# Patient Record
Sex: Female | Born: 1935 | Race: Black or African American | Hispanic: No | Marital: Single | Smoking: Former smoker
Health system: Southern US, Community
[De-identification: ages and names within clinical notes are randomized; demographics above are authoritative.]

## PROBLEM LIST (undated history)

## (undated) DIAGNOSIS — C50311 Malignant neoplasm of lower-inner quadrant of right female breast: Principal | ICD-10-CM

## (undated) DIAGNOSIS — I10 Essential (primary) hypertension: Secondary | ICD-10-CM

## (undated) DIAGNOSIS — Z8711 Personal history of peptic ulcer disease: Secondary | ICD-10-CM

## (undated) DIAGNOSIS — M1990 Unspecified osteoarthritis, unspecified site: Secondary | ICD-10-CM

## (undated) DIAGNOSIS — C50919 Malignant neoplasm of unspecified site of unspecified female breast: Secondary | ICD-10-CM

## (undated) DIAGNOSIS — J45909 Unspecified asthma, uncomplicated: Secondary | ICD-10-CM

## (undated) DIAGNOSIS — F0390 Unspecified dementia without behavioral disturbance: Secondary | ICD-10-CM

## (undated) DIAGNOSIS — Z8719 Personal history of other diseases of the digestive system: Secondary | ICD-10-CM

## (undated) DIAGNOSIS — I499 Cardiac arrhythmia, unspecified: Secondary | ICD-10-CM

## (undated) DIAGNOSIS — F0150 Vascular dementia without behavioral disturbance: Secondary | ICD-10-CM

## (undated) DIAGNOSIS — I2510 Atherosclerotic heart disease of native coronary artery without angina pectoris: Secondary | ICD-10-CM

## (undated) DIAGNOSIS — G5600 Carpal tunnel syndrome, unspecified upper limb: Secondary | ICD-10-CM

## (undated) DIAGNOSIS — E785 Hyperlipidemia, unspecified: Secondary | ICD-10-CM

## (undated) HISTORY — PX: ABDOMINAL HYSTERECTOMY: SHX81

## (undated) HISTORY — PX: SHOULDER ARTHROSCOPY: SHX128

## (undated) HISTORY — PX: EYE SURGERY: SHX253

## (undated) HISTORY — DX: Malignant neoplasm of lower-inner quadrant of right female breast: C50.311

## (undated) HISTORY — PX: MASTECTOMY: SHX3

---

## 1997-12-05 ENCOUNTER — Emergency Department (HOSPITAL_COMMUNITY): Admission: EM | Admit: 1997-12-05 | Discharge: 1997-12-05 | Payer: Self-pay | Admitting: Emergency Medicine

## 1998-01-06 ENCOUNTER — Other Ambulatory Visit: Admission: RE | Admit: 1998-01-06 | Discharge: 1998-01-06 | Payer: Self-pay | Admitting: Family Medicine

## 1998-09-30 ENCOUNTER — Ambulatory Visit (HOSPITAL_COMMUNITY): Admission: RE | Admit: 1998-09-30 | Discharge: 1998-09-30 | Payer: Self-pay | Admitting: Internal Medicine

## 1998-09-30 ENCOUNTER — Encounter: Payer: Self-pay | Admitting: Internal Medicine

## 1998-10-21 ENCOUNTER — Ambulatory Visit (HOSPITAL_COMMUNITY): Admission: RE | Admit: 1998-10-21 | Discharge: 1998-10-21 | Payer: Self-pay | Admitting: Internal Medicine

## 1998-10-21 ENCOUNTER — Encounter: Payer: Self-pay | Admitting: Internal Medicine

## 1998-11-04 ENCOUNTER — Ambulatory Visit (HOSPITAL_COMMUNITY): Admission: RE | Admit: 1998-11-04 | Discharge: 1998-11-04 | Payer: Self-pay

## 1998-12-10 ENCOUNTER — Emergency Department (HOSPITAL_COMMUNITY): Admission: EM | Admit: 1998-12-10 | Discharge: 1998-12-10 | Payer: Self-pay | Admitting: Emergency Medicine

## 2000-10-17 ENCOUNTER — Encounter: Payer: Self-pay | Admitting: Family Medicine

## 2000-10-17 ENCOUNTER — Ambulatory Visit (HOSPITAL_COMMUNITY): Admission: RE | Admit: 2000-10-17 | Discharge: 2000-10-17 | Payer: Self-pay | Admitting: Family Medicine

## 2000-10-18 ENCOUNTER — Encounter: Payer: Self-pay | Admitting: Emergency Medicine

## 2000-10-18 ENCOUNTER — Emergency Department (HOSPITAL_COMMUNITY): Admission: EM | Admit: 2000-10-18 | Discharge: 2000-10-18 | Payer: Self-pay | Admitting: Emergency Medicine

## 2000-10-26 ENCOUNTER — Ambulatory Visit (HOSPITAL_COMMUNITY): Admission: RE | Admit: 2000-10-26 | Discharge: 2000-10-26 | Payer: Self-pay | Admitting: Family Medicine

## 2000-11-22 ENCOUNTER — Emergency Department (HOSPITAL_COMMUNITY): Admission: EM | Admit: 2000-11-22 | Discharge: 2000-11-22 | Payer: Self-pay | Admitting: *Deleted

## 2000-11-28 ENCOUNTER — Ambulatory Visit (HOSPITAL_COMMUNITY): Admission: RE | Admit: 2000-11-28 | Discharge: 2000-11-28 | Payer: Self-pay | Admitting: Family Medicine

## 2000-11-28 ENCOUNTER — Encounter: Payer: Self-pay | Admitting: Family Medicine

## 2001-04-29 ENCOUNTER — Encounter: Payer: Self-pay | Admitting: Family Medicine

## 2001-04-29 ENCOUNTER — Ambulatory Visit (HOSPITAL_COMMUNITY): Admission: RE | Admit: 2001-04-29 | Discharge: 2001-04-29 | Payer: Self-pay | Admitting: Family Medicine

## 2001-07-19 HISTORY — PX: CARDIAC CATHETERIZATION: SHX172

## 2002-05-09 ENCOUNTER — Encounter: Payer: Self-pay | Admitting: Family Medicine

## 2002-05-09 ENCOUNTER — Inpatient Hospital Stay (HOSPITAL_COMMUNITY): Admission: EM | Admit: 2002-05-09 | Discharge: 2002-05-10 | Payer: Self-pay | Admitting: Emergency Medicine

## 2002-11-13 ENCOUNTER — Encounter: Payer: Self-pay | Admitting: Internal Medicine

## 2002-11-13 ENCOUNTER — Ambulatory Visit (HOSPITAL_COMMUNITY): Admission: RE | Admit: 2002-11-13 | Discharge: 2002-11-13 | Payer: Self-pay | Admitting: Internal Medicine

## 2004-02-25 ENCOUNTER — Ambulatory Visit (HOSPITAL_COMMUNITY): Admission: RE | Admit: 2004-02-25 | Discharge: 2004-02-25 | Payer: Self-pay | Admitting: Family Medicine

## 2004-05-18 ENCOUNTER — Ambulatory Visit: Payer: Self-pay | Admitting: Nurse Practitioner

## 2004-05-19 ENCOUNTER — Ambulatory Visit (HOSPITAL_COMMUNITY): Admission: RE | Admit: 2004-05-19 | Discharge: 2004-05-19 | Payer: Self-pay | Admitting: Internal Medicine

## 2004-08-14 ENCOUNTER — Ambulatory Visit: Payer: Self-pay | Admitting: Nurse Practitioner

## 2004-11-27 ENCOUNTER — Ambulatory Visit: Payer: Self-pay | Admitting: Nurse Practitioner

## 2005-02-10 ENCOUNTER — Ambulatory Visit: Payer: Self-pay | Admitting: Nurse Practitioner

## 2005-05-07 ENCOUNTER — Emergency Department (HOSPITAL_COMMUNITY): Admission: EM | Admit: 2005-05-07 | Discharge: 2005-05-07 | Payer: Self-pay | Admitting: Emergency Medicine

## 2005-06-17 ENCOUNTER — Ambulatory Visit: Payer: Self-pay | Admitting: Nurse Practitioner

## 2005-06-24 ENCOUNTER — Ambulatory Visit (HOSPITAL_COMMUNITY): Admission: RE | Admit: 2005-06-24 | Discharge: 2005-06-24 | Payer: Self-pay | Admitting: Internal Medicine

## 2005-11-17 ENCOUNTER — Encounter: Payer: Self-pay | Admitting: Emergency Medicine

## 2008-04-25 ENCOUNTER — Ambulatory Visit (HOSPITAL_COMMUNITY): Admission: RE | Admit: 2008-04-25 | Discharge: 2008-04-25 | Payer: Self-pay | Admitting: Internal Medicine

## 2010-12-04 NOTE — H&P (Signed)
NAME:  Jennifer Garrett, Jennifer Garrett                             ACCOUNT NO.:  [id number]   MEDICAL RECORD NO.:  [id number]                   PATIENT TYPE:  INP   LOCATION:  6532                                 FACILITY:  [place]   PHYSICIAN:  Eden Lathe. Einar Gip, M.D.                  DATE OF BIRTH:  07-Dec-1931   DATE OF ADMISSION:  05/09/2002  DATE OF DISCHARGE:                                HISTORY & PHYSICAL   CHIEF COMPLAINT:  Chest pain and shortness of breath.   HISTORY OF PRESENT ILLNESS:  The patient is a 77 year old female with no  prior history of coronary disease or MI.  She is followed at [facility] by  Dr. Abelina Bachelor.  She presented to the emergency room at [facility] with  complaints of three days of substernal chest pain and dyspnea on exertion.  She describes her pain as a midsternal ache.  She came to the emergency  room on October 22, because of her persistent symptoms.  She is not taking  any medications for this discomfort.  She does have some radiation to her  shoulders.  Symptoms seem to be exertional and she has had no rest pain.   PAST MEDICAL HISTORY:  1. Hypertension.  2. Non-insulin dependent diabetes mellitus.  3. She is unsure of her cholesterol status.  She had been on hyperlipidemia     therapy in the past, but this was stopped and she says her cholesterol     recently was okay.   PAST SURGICAL HISTORY:  1. Hysterectomy.  2. Shoulder surgery on both shoulders.   MEDICATIONS:  1. Glucotrol XL 10 mg a day.  2. Avandia  3. Lisinopril.  4. Hydrochlorothiazide.  5. Triamterene.   ALLERGIES:  ORUDIS and FELDENE.   SOCIAL HISTORY:  She is an ex-smoker quitting more than 20 year ago.  She is  single and lives with her daughter.  She has two children and no  grandchildren.  She is unemployed.   FAMILY HISTORY:  Mother died in her 0000000 of complications of congestion  failure.  Father died at 58.  She has one brother and four sisters, but none  with coronary artery  disease.   REVIEW OF SYMPTOMS:  Essentially unremarkable except as noted above.  She  has had a remote GI bleed.  She has had remote thyroid problems, but is not  on medication.  She did have a fall getting out of the bathtub in May with  left rib fractures.   PHYSICAL EXAMINATION:  VITAL SIGNS:  Blood pressure 122/60, pulse 88,  respirations 16.  GENERAL:  She is a well-developed, well-nourished, overweight, African-  American female in no acute distress.  HEENT:  Normocephalic, extraocular movements intact.  Sclerae nonicteric.  Conjunctivae within normal limits.  NECK:  Without bruits or JVD.  CHEST:  Clear to auscultation and percussion.  CARDIAC:  Regular rate and rhythm without obvious murmur, rub or gallop.  Normal S1, S2.  ABDOMEN:  Obese, nontender.  Bowel sounds present.  EXTREMITIES:  Rule out edema.  Distal pulses are 2+/4 bilaterally.  NEUROLOGIC:  Grossly intact.  She is awake, alert and oriented.  Moves all  extremities without obvious deficit.   LABORATORY DATA AND X-RAY FINDINGS:  EKG with sinus rhythm with septal Q's,  PVCs and LVH.  She has a left axis deviation.   Labs with sodium 129, potassium 5.2, BUN 18, creatinine 1.0.  White count  6.1, hemoglobin 13.4, hematocrit 41.3, platelets 319.  CK 168, MB 1.1,  troponin negative.  Chest x-ray shows no active disease.   IMPRESSION:  1. Unstable angina.  2. Abnormal electrocardiogram.  3. Hypertension with hypertensive cardiovascular disease.  4. Non-insulin dependent diabetes mellitus.  5. Obesity.   PLAN:  1. The patient was started on aspirin, nitrates and heparin.  2. She will be taken to the catheterization lab.     Erlene Quan, P.A.                      Eden Lathe. Einar Gip, M.D.    Meryl Dare  D:  05/10/2002  T:  05/10/2002  Job:  [id number]

## 2010-12-04 NOTE — Discharge Summary (Signed)
NAME:  Jennifer Garrett, Jennifer Garrett                             ACCOUNT NO.:  [id number]   MEDICAL RECORD NO.:  [id number]                   PATIENT TYPE:  INP   LOCATION:  [id number]                                 FACILITY:  [facility]   PHYSICIAN:  Josef Tourigny DICTATOR                    DATE OF BIRTH:  23-Nov-1931   DATE OF ADMISSION:  05/09/2002  DATE OF DISCHARGE:                                 DISCHARGE SUMMARY   DISCHARGE DIAGNOSES:  1. Unstable angina.  2. Coronary disease, OM3 stenting by Dr. Einar Gip this admission.  3. Non-insulin dependent diabetes mellitus.  4. Hypertension with suboptimal control.  5. Hyperlipidemia.   HOSPITAL COURSE:  The patient is a 77 year old female followed by Dr.  Abelina Bachelor at [facility]. She has no prior history of coronary artery  disease or myocardial infarction. She was admitted through the emergency  room on May 09, 2002 with three days of substernal chest pain,  consistent with unstable angina. She has risk factors as noted above. She  was taken to the catheterization lab by Dr. Einar Gip. She was started on  Heparin, nitrates, and aspirin in the emergency room. Catheterization on  May 09, 2002 revealed an 80% small RCA narrowing, 20-30% hazy proximal  LAD lesion, and a 90% OM3 lesion that was effaced and felt to be heavy with  plaque. The patient underwent angioplasty and cipher Stent placement to the  OM3. Renal arteries were patent. Aorta was tortuous and there was no  significant iliac disease. The patient tolerated the procedure well. CK and  troponin's remained negative. She will be discharged later on the 23rd.   DISCHARGE MEDICATIONS:  1. Zocor 40 mg each day.  2. Aspirin 81 mg each day.  3. Plavix 75 mg each day. Dr. Einar Gip recommends Plavix for 9-10 months.  4. The patient was on Lisinopril and HCTZ at home, although she is unsure of     the dose. She will resume her home dose.  5. Glucotrol XL 10 mg each day.  6. Avandia (she is also unsure of  her dose of this).  7. Nitroglycerin prescription given to patient.   LABORATORY DATA:  White count 5.9, hemoglobin and hematocrit 11.7 and 34.8,  platelets 287, sodium 134, potassium 4.0, BUN 12, creatinine 0.8. CK, MB and  troponin's are negative times three. Lipid profile shows an LDL of 104, HDL  43, triglycerides 92. INR was 0.9. EKG reveals sinus rhythm, left axis  deviation, some evidence of left ventricular hypertrophy.   DIAGNOSTIC STUDIES:  Chest x-ray shows healing left rib fractures.   DISPOSITION:  The patient was discharged in stable to home.    FOLLOW UP:  With Dr. Einar Gip in a few weeks. She will need a fasting lipid  profile in three months. Dr. Einar Gip has recommended Plavix for 9-12 months,  although this may be problematic because of cost  for her.                                               Kajol Crispen DICTATOR    DD/MEDQ  D:  05/10/2002  T:  05/10/2002  Job:  [id number]   cc:   Florian Buff, M.D.   Abelina Bachelor, M.D.  [facility]

## 2010-12-04 NOTE — Cardiovascular Report (Signed)
NAME:  Jennifer Garrett, Jennifer Garrett                             ACCOUNT NO.:  [id number]   MEDICAL RECORD NO.:  [id number]                   PATIENT TYPE:  INP   LOCATION:  6532                                 FACILITY:  [place]   PHYSICIAN:  Eden Lathe. Einar Gip, M.D.                  DATE OF BIRTH:  11-20-1931   DATE OF PROCEDURE:  05/09/2002  DATE OF DISCHARGE:                              CARDIAC CATHETERIZATION   PROCEDURE PERFORMED:  1. Left heart catheterization including:  2. Left ventriculography.  3. Selective left and right coronary arteriography.  4. Abdominal aortogram.  5. Intravascular ultrasound interrogation of the left anterior descending     artery.  6. Intravascular ultrasound interrogation of the obtuse marginal branch of     the circumflex coronary artery.  7. Percutaneous transluminal coronary angioplasty and stenting of the obtuse     marginal branch of the circumflex coronary artery.  8. Intracoronary nitroglycerin administration.  9. Adjuvant Integrilin utilization.   INDICATIONS FOR PROCEDURE:  The patient is a 77 year old female with a  previous history of hypertension, diabetes mellitus, who was admitted to the  hospital with chest pain suggestive of unstable angina.  Due to multiple  cardiac risk factors, she was brought directly to the cardiac  catheterization lab to evaluate her coronary anatomy.  She was still having  ongoing chest pain in the emergency room although this was mild and was  subtle.   HEMODYNAMIC DATA:  1. The left ventricular pressures were 111/2 with an end diastolic pressure     of 9 mmHg.  2. The aortic pressure was 102/60 with a mean of 77 mmHg.  3. There was no pressure gradient across the aortic valve.   ANGIOGRAPHIC DATA:  1. Left ventricle:  The left ventricular systolic function was normal.     Ejection fraction was estimated at 60%.  There was no significant mitral     regurgitation.  2. Right coronary artery:  The right coronary artery is  a nondominant     vessel.  The mid segment has 80% stenosis.  3. Left main coronary artery:  The left main coronary is a large caliber     vessel.  It has mild calcification.  Otherwise it is normal.  4. Circumflex coronary artery:  The circumflex coronary artery is a very     large caliber vessel and a dominant vessel.  It gives origin to a very     large PDA.  It also gives origin to three obtuse marginal branches,     obtuse marginal #1, #2, and #3.  The obtuse marginal #3 has mid 80-90%     stenosis.  The circumflex itself has mild diffuse disease.  5. Left anterior descending artery:  The left anterior descending artery is     a very large caliber vessel and gives origin to a large diagonal #1.  It  wraps around the apex.  The proximal segment of the left anterior     descending artery and also the mid segment after the origin of the     diagonal #1 has hazy 20-30% stenosis and is suspicious for an unstable     plaque versus probably a lesion filled with plaque.  Otherwise the LAD     has mild diffuse disease.  6. Abdominal aortogram:  Abdominal aortogram was performed to evaluate for     peripheral vascular disease, given abnormal physical findings of right     femoral bruit.  There was also tortuosity noted of the catheter.     Abdominal aortogram revealed mild atherosclerotic changes of the     abdominal aorta with tortuosity.  There are two renal arteries, one on     either side and were widely patent.   IMPRESSION:  1. An 80% angiographic stenosis of the obtuse marginal #3 branch of the     circumflex coronary artery.  The circumflex is a dominant coronary     artery.  2. Hazy tandem lesions in the proximal and mid left anterior descending     artery constituting 20-30% stenosis.  3. Normal left ventricular systolic function.  Ejection fraction 60%.   IVUS DATA:  The LAD lesions were 20-30% with mild calcification.  There was  no significant luminal irregularity and the  lumen measured about 3.8 to 4.0  mm in the proximal and about 3.5 mm in the mid segment.   IVUS data on the obtuse marginal branch of the circumflex coronary artery:  The IVUS of OM-3 revealed a tightly packed lesion in the mid obtuse marginal  branch.  This was mostly a soft plaque (90% stenosis).   INTERVENTIONAL DATA:  PTCA and stenting of the obtuse marginal #3 branch of  the circumflex coronary artery with a 2.5 x 13-mm CYPHER stent.  The stent  was deployed at 16 atmospheres of pressure.   The stent was post dilated with a 2.75 x 9-mm Quantum balloon at 16  atmospheres of pressure giving a 2.8 mm lumen.   Post angioplasty IVUS interrogation of the obtuse marginal branch:  Post PCI  IVUS interrogation of the obtuse marginal revealed adequate stent deployment  and apposition to the wall.  There was no evidence of aortic stenosis.   OVERALL IMPRESSION:  Successful percutaneous transluminal coronary  angioplasty and stenting of the obtuse marginal #3 branch of a dominant  circumflex coronary artery with a 2.5 x 13-mm CYPHER stent and post  dilatation with a 2.75 x 9-mm Quantum balloon.  The stenosis was reduced  from 90% to 0% with TIMI-3 to TIMI-3 flow maintained.  No evidence of  dissection, no evidence of thrombus at the end of the procedure.   TECHNIQUE:  Under usual sterile precautions using #6 French right femoral  access, a #6 [place] Multipurpose B2 catheter was advanced to the ascending  aorta over a 0.035-inch J wire.  The catheter was gently advanced to the  left ventricle.  Left ventricular pressures were monitored.  Hand contrast  injection of the left ventricle was performed both in the LAO and RAO  projections.  The catheter was flushed with saline and pulled back into the  ascending aorta and pressure gradient across the aortic valve was monitored. The right coronary artery was selectively engaged and angiography was  performed.  Then the catheter was pulled back and  the left main coronary  artery was selectively engaged and angiography  was performed.  Intracoronary  nitroglycerin was also administered.  Then the catheter was pulled back into  the abdominal aorta and abdominal aortogram was performed.   TECHNIQUE OF INTERVENTION:  The #6 French sheath was exchanged for a #7  [place] sheath.  Then a #7 [place] FR-4 guide catheter was advanced to the  ascending aorta over a 0.035-inch J wire.  The catheter was manipulated and  engaged in the left main coronary artery and angiography was performed.  Then a 180-cm x 0.014-inch ATW guidewire was advanced into the left anterior  descending artery and a Scimed IVUS catheter was advanced over this  guidewire into the left anterior descending artery.  IVUS interrogation of  the left anterior descending artery was performed.  The IVUS data was  carefully analyzed.  Then the catheter was pulled back into the guiding  catheter and the guidewire was pulled back into the guiding catheter.  Angiography was performed.  Then the same guidewire was advanced into the  circumflex coronary artery and the IVUS catheter was then readvanced into  the obtuse marginal #3 branch of the circumflex coronary artery and IVUS  interrogation of the obtuse marginal #3 was performed.  Then the IVUS  catheter was pulled back outside of the body.  Then a 2.5 x 13-mm CYPHER  stent was advanced into the obtuse marginal #3 branch of the circumflex  coronary artery and the stent was deployed at a total of 16 atmospheres of  pressure for 90 seconds.  The balloon was deflated, pulled back into the  guiding catheter, and arteriography was performed.  Because of the dumbbell  shape of the stent, a 2.75 x 9-mm Quantum noncompliant balloon was advanced  into the stent lumen and forced dilatation was performed at 12, and a total  of 16, atmospheres of pressure within the stent for a total of 30 seconds  each, and the balloon was deflated and pulled  back into the guiding catheter  and arteriography was performed.  Intracoronary nitroglycerin was also  administered.  Excellent results were noted.  Then the balloon was returned  out of the body.  Then the Sci-Med IVUS catheter was readvanced over the  guidewire into the obtuse marginal #3 and IVUS interrogation was again  performed.  The data was carefully analyzed.  Then the guidewire and the  IVUS catheter were pulled out of the body.  Arteriography was performed.  Excellent results were noted overall.  Then the arterial access sheath was  sutured in place and the guide catheter was then removed out of the body in  the usual fashion and the patient was transferred to recovery in stable  condition.  The patient tolerated the procedure well.                                                Eden Lathe. Einar Gip, M.D.   Minna Antis  D:  05/09/2002  T:  05/09/2002  Job:  [id number]

## 2012-03-03 ENCOUNTER — Other Ambulatory Visit: Payer: Self-pay | Admitting: Orthopedic Surgery

## 2012-03-24 ENCOUNTER — Encounter (HOSPITAL_BASED_OUTPATIENT_CLINIC_OR_DEPARTMENT_OTHER): Payer: Self-pay | Admitting: *Deleted

## 2012-03-24 NOTE — Progress Notes (Signed)
To come in for lqabs-called dr berry for ekg-notes-had stent 2003

## 2012-03-27 ENCOUNTER — Encounter (HOSPITAL_BASED_OUTPATIENT_CLINIC_OR_DEPARTMENT_OTHER)
Admission: RE | Admit: 2012-03-27 | Discharge: 2012-03-27 | Disposition: A | Discharge status: Home / Self Care | Payer: Medicare Other | Source: Ambulatory Visit | Attending: Orthopedic Surgery | Admitting: Orthopedic Surgery

## 2012-03-27 LAB — BASIC METABOLIC PANEL
Calcium: 9.6 mg/dL (ref 8.4–10.5)
GFR calc Af Amer: 76 mL/min — ABNORMAL LOW (ref >90)
GFR calc non Af Amer: 65 mL/min — ABNORMAL LOW (ref >90)
Sodium: 139 mEq/L (ref 135–145)

## 2012-03-29 ENCOUNTER — Encounter (HOSPITAL_BASED_OUTPATIENT_CLINIC_OR_DEPARTMENT_OTHER): Payer: Self-pay | Admitting: *Deleted

## 2012-03-29 ENCOUNTER — Encounter (HOSPITAL_BASED_OUTPATIENT_CLINIC_OR_DEPARTMENT_OTHER): Payer: Self-pay | Admitting: Certified Registered Nurse Anesthetist

## 2012-03-29 ENCOUNTER — Encounter (HOSPITAL_BASED_OUTPATIENT_CLINIC_OR_DEPARTMENT_OTHER): Payer: Self-pay | Admitting: Orthopedic Surgery

## 2012-03-29 ENCOUNTER — Encounter (HOSPITAL_BASED_OUTPATIENT_CLINIC_OR_DEPARTMENT_OTHER)
Admission: RE | Disposition: A | Discharge status: Home / Self Care | Payer: Self-pay | Source: Ambulatory Visit | Attending: Orthopedic Surgery

## 2012-03-29 ENCOUNTER — Ambulatory Visit (HOSPITAL_BASED_OUTPATIENT_CLINIC_OR_DEPARTMENT_OTHER): Payer: Medicare Other | Admitting: *Deleted

## 2012-03-29 ENCOUNTER — Ambulatory Visit (HOSPITAL_BASED_OUTPATIENT_CLINIC_OR_DEPARTMENT_OTHER)
Admission: RE | Admit: 2012-03-29 | Discharge: 2012-03-29 | Disposition: A | Discharge status: Home / Self Care | Payer: Medicare Other | Source: Ambulatory Visit | Attending: Orthopedic Surgery | Admitting: Orthopedic Surgery

## 2012-03-29 DIAGNOSIS — G5600 Carpal tunnel syndrome, unspecified upper limb: Secondary | ICD-10-CM | POA: Insufficient documentation

## 2012-03-29 DIAGNOSIS — M6530 Trigger finger, unspecified finger: Secondary | ICD-10-CM | POA: Insufficient documentation

## 2012-03-29 DIAGNOSIS — J45909 Unspecified asthma, uncomplicated: Secondary | ICD-10-CM | POA: Insufficient documentation

## 2012-03-29 DIAGNOSIS — E119 Type 2 diabetes mellitus without complications: Secondary | ICD-10-CM | POA: Insufficient documentation

## 2012-03-29 DIAGNOSIS — Z01812 Encounter for preprocedural laboratory examination: Secondary | ICD-10-CM | POA: Insufficient documentation

## 2012-03-29 DIAGNOSIS — Z01810 Encounter for preprocedural cardiovascular examination: Secondary | ICD-10-CM | POA: Insufficient documentation

## 2012-03-29 DIAGNOSIS — I10 Essential (primary) hypertension: Secondary | ICD-10-CM | POA: Insufficient documentation

## 2012-03-29 DIAGNOSIS — I2510 Atherosclerotic heart disease of native coronary artery without angina pectoris: Secondary | ICD-10-CM | POA: Insufficient documentation

## 2012-03-29 HISTORY — DX: Carpal tunnel syndrome, unspecified upper limb: G56.00

## 2012-03-29 HISTORY — DX: Atherosclerotic heart disease of native coronary artery without angina pectoris: I25.10

## 2012-03-29 HISTORY — DX: Unspecified asthma, uncomplicated: J45.909

## 2012-03-29 HISTORY — DX: Essential (primary) hypertension: I10

## 2012-03-29 HISTORY — PX: CARPAL TUNNEL RELEASE: SHX101

## 2012-03-29 HISTORY — DX: Hyperlipidemia, unspecified: E78.5

## 2012-03-29 HISTORY — PX: TRIGGER FINGER RELEASE: SHX641

## 2012-03-29 HISTORY — DX: Unspecified osteoarthritis, unspecified site: M19.90

## 2012-03-29 LAB — GLUCOSE, CAPILLARY: Glucose-Capillary: 126 mg/dL — ABNORMAL HIGH (ref 70–99)

## 2012-03-29 LAB — POCT HEMOGLOBIN-HEMACUE: Hemoglobin: 11.7 g/dL — ABNORMAL LOW (ref 12.0–15.0)

## 2012-03-29 SURGERY — CARPAL TUNNEL RELEASE
Anesthesia: Monitor Anesthesia Care | Site: Wrist | Laterality: Left | Wound class: Clean

## 2012-03-29 MED ORDER — PROPOFOL 10 MG/ML IV EMUL
INTRAVENOUS | Status: DC | PRN
  Administered 2012-03-29: 75 ug/kg/min via INTRAVENOUS

## 2012-03-29 MED ORDER — BUPIVACAINE HCL (PF) 0.25 % IJ SOLN
INTRAMUSCULAR | Status: DC | PRN
  Administered 2012-03-29: 10 mL

## 2012-03-29 MED ORDER — FENTANYL CITRATE 0.05 MG/ML IJ SOLN: 25.0000 ug | INTRAMUSCULAR | Status: DC | PRN

## 2012-03-29 MED ORDER — LACTATED RINGERS IV SOLN
INTRAVENOUS | Status: DC
  Administered 2012-03-29: 09:00:00 via INTRAVENOUS

## 2012-03-29 MED ORDER — CHLORHEXIDINE GLUCONATE 4 % EX LIQD: 60.0000 mL | Freq: Once | CUTANEOUS | Status: DC

## 2012-03-29 MED ORDER — CEFAZOLIN SODIUM-DEXTROSE 2-3 GM-% IV SOLR
2.0000 g | INTRAVENOUS | Status: AC
  Administered 2012-03-29: 2 g via INTRAVENOUS

## 2012-03-29 MED ORDER — MIDAZOLAM HCL 2 MG/2ML IJ SOLN: 0.5000 mg | Freq: Once | INTRAMUSCULAR | Status: DC | PRN

## 2012-03-29 MED ORDER — ONDANSETRON HCL 4 MG/2ML IJ SOLN
INTRAMUSCULAR | Status: DC | PRN
  Administered 2012-03-29: 4 mg via INTRAVENOUS

## 2012-03-29 MED ORDER — MEPERIDINE HCL 25 MG/ML IJ SOLN: 6.2500 mg | INTRAMUSCULAR | Status: DC | PRN

## 2012-03-29 MED ORDER — PROMETHAZINE HCL 25 MG/ML IJ SOLN: 6.2500 mg | INTRAMUSCULAR | Status: DC | PRN

## 2012-03-29 MED ORDER — HYDROCODONE-ACETAMINOPHEN 5-500 MG PO TABS: 1.0000 | ORAL_TABLET | ORAL | Status: AC | PRN

## 2012-03-29 MED ORDER — FENTANYL CITRATE 0.05 MG/ML IJ SOLN
INTRAMUSCULAR | Status: DC | PRN
  Administered 2012-03-29: 25 ug via INTRAVENOUS

## 2012-03-29 SURGICAL SUPPLY — 38 items
BANDAGE COBAN STERILE 2 (GAUZE/BANDAGES/DRESSINGS) ×3 IMPLANT
BANDAGE GAUZE ELAST BULKY 4 IN (GAUZE/BANDAGES/DRESSINGS) ×3 IMPLANT
BLADE SURG 15 STRL LF DISP TIS (BLADE) ×2 IMPLANT
BLADE SURG 15 STRL SS (BLADE) ×3
BNDG CMPR 9X4 STRL LF SNTH (GAUZE/BANDAGES/DRESSINGS)
BNDG COHESIVE 3X5 TAN STRL LF (GAUZE/BANDAGES/DRESSINGS) ×3 IMPLANT
BNDG ESMARK 4X9 LF (GAUZE/BANDAGES/DRESSINGS) IMPLANT
CHLORAPREP W/TINT 26ML (MISCELLANEOUS) ×3 IMPLANT
CLOTH BEACON ORANGE TIMEOUT ST (SAFETY) ×3 IMPLANT
CORDS BIPOLAR (ELECTRODE) ×3 IMPLANT
COVER MAYO STAND STRL (DRAPES) ×3 IMPLANT
COVER TABLE BACK 60X90 (DRAPES) ×3 IMPLANT
CUFF TOURNIQUET SINGLE 18IN (TOURNIQUET CUFF) ×3 IMPLANT
DECANTER SPIKE VIAL GLASS SM (MISCELLANEOUS) IMPLANT
DRAPE EXTREMITY T 121X128X90 (DRAPE) ×3 IMPLANT
DRAPE SURG 17X23 STRL (DRAPES) ×3 IMPLANT
DRSG KUZMA FLUFF (GAUZE/BANDAGES/DRESSINGS) ×3 IMPLANT
GAUZE XEROFORM 1X8 LF (GAUZE/BANDAGES/DRESSINGS) ×3 IMPLANT
GLOVE BIO SURGEON STRL SZ 6.5 (GLOVE) ×3 IMPLANT
GLOVE SURG ORTHO 8.0 STRL STRW (GLOVE) ×3 IMPLANT
GOWN BRE IMP PREV XXLGXLNG (GOWN DISPOSABLE) ×3 IMPLANT
GOWN PREVENTION PLUS XLARGE (GOWN DISPOSABLE) ×3 IMPLANT
NEEDLE 27GAX1X1/2 (NEEDLE) ×1 IMPLANT
NS IRRIG 1000ML POUR BTL (IV SOLUTION) ×3 IMPLANT
PACK BASIN DAY SURGERY FS (CUSTOM PROCEDURE TRAY) ×3 IMPLANT
PAD CAST 3X4 CTTN HI CHSV (CAST SUPPLIES) ×2 IMPLANT
PADDING CAST ABS 4INX4YD NS (CAST SUPPLIES) ×1
PADDING CAST ABS COTTON 4X4 ST (CAST SUPPLIES) ×2 IMPLANT
PADDING CAST COTTON 3X4 STRL (CAST SUPPLIES) ×3
SPONGE GAUZE 4X4 12PLY (GAUZE/BANDAGES/DRESSINGS) ×3 IMPLANT
STOCKINETTE 4X48 STRL (DRAPES) ×3 IMPLANT
SUT VICRYL 4-0 PS2 18IN ABS (SUTURE) IMPLANT
SUT VICRYL RAPIDE 4/0 PS 2 (SUTURE) ×3 IMPLANT
SYR BULB 3OZ (MISCELLANEOUS) ×3 IMPLANT
SYR CONTROL 10ML LL (SYRINGE) ×1 IMPLANT
TOWEL OR 17X24 6PK STRL BLUE (TOWEL DISPOSABLE) ×6 IMPLANT
UNDERPAD 30X30 INCONTINENT (UNDERPADS AND DIAPERS) ×3 IMPLANT
WATER STERILE IRR 1000ML POUR (IV SOLUTION) ×3 IMPLANT

## 2012-03-29 NOTE — Transfer of Care (Signed)
Immediate Anesthesia Transfer of Care Note  Patient: Jennifer Garrett  Procedure(s) Performed: Procedure(s) (LRB) with comments: CARPAL TUNNEL RELEASE (Left) RELEASE TRIGGER FINGER/A-1 PULLEY (Left)  Patient Location: PACU  Anesthesia Type: Bier block  Level of Consciousness: awake, alert , oriented and patient cooperative  Airway & Oxygen Therapy: Patient Spontanous Breathing and Patient connected to face mask oxygen  Post-op Assessment: Report given to PACU RN and Post -op Vital signs reviewed and stable  Post vital signs: Reviewed and stable  Complications: No apparent anesthesia complications

## 2012-03-29 NOTE — Anesthesia Preprocedure Evaluation (Addendum)
Anesthesia Evaluation  Patient identified by MRN, date of birth, ID band Patient awake    Reviewed: Allergy & Precautions, H&P , NPO status , Patient's Chart, lab work & pertinent test results  History of Anesthesia Complications Negative for: history of anesthetic complications  Airway Mallampati: I TM Distance: >3 FB Neck ROM: Full    Dental  (+) Edentulous Upper, Edentulous Lower and Dental Advisory Given   Pulmonary asthma ,  breath sounds clear to auscultation  Pulmonary exam normal       Cardiovascular hypertension, Pt. on medications + CAD and + Cardiac Stents Rhythm:Regular Rate:Normal  Cath '03: single vessel cx disease, stented   EF 60%   Neuro/Psych negative neurological ROS     GI/Hepatic negative GI ROS, Neg liver ROS,   Endo/Other  diabetes (glu 126), Type 2, Oral Hypoglycemic AgentsMorbid obesity  Renal/GU negative Renal ROS     Musculoskeletal   Abdominal (+) + obese,   Peds  Hematology   Anesthesia Other Findings   Reproductive/Obstetrics                          Anesthesia Physical Anesthesia Plan  ASA: III  Anesthesia Plan: MAC and Bier Block   Post-op Pain Management:    Induction:   Airway Management Planned: Simple Face Mask  Additional Equipment:   Intra-op Plan:   Post-operative Plan:   Informed Consent: I have reviewed the patients History and Physical, chart, labs and discussed the procedure including the risks, benefits and alternatives for the proposed anesthesia with the patient or authorized representative who has indicated his/her understanding and acceptance.     Plan Discussed with: CRNA and Surgeon  Anesthesia Plan Comments: (Plan routine monitors, IV Regional Lidocaine)        Anesthesia Quick Evaluation

## 2012-03-29 NOTE — Brief Op Note (Signed)
03/29/2012  9:33 AM  PATIENT:  Jennifer Garrett  78 y.o. female  PRE-OPERATIVE DIAGNOSIS:  LEFT CTS, STS LEFT MIDDLE FINGER  POST-OPERATIVE DIAGNOSIS:  LEFT CTS, STS LEFT MIDDLE FINGER  PROCEDURE:  Procedure(s) (LRB) with comments: CARPAL TUNNEL RELEASE (Left) RELEASE TRIGGER FINGER/A-1 PULLEY (Left)  SURGEON:  Surgeon(s) and Role:    * Wynonia Sours, MD - Primary  PHYSICIAN ASSISTANT:   ASSISTANTS: none   ANESTHESIA:   local and regional  EBL:  Total I/O In: 600 [I.V.:600] Out: -   BLOOD ADMINISTERED:none  DRAINS: none   LOCAL MEDICATIONS USED:  MARCAINE     SPECIMEN:  No Specimen  DISPOSITION OF SPECIMEN:  PATHOLOGY  COUNTS:  YES  TOURNIQUET:   Total Tourniquet Time Documented: Upper Arm (Left) - 31 minutes  DICTATION: .Other Dictation: Dictation Number [id number]  PLAN OF CARE: Discharge to home after PACU  PATIENT DISPOSITION:  PACU - hemodynamically stable.

## 2012-03-29 NOTE — Anesthesia Postprocedure Evaluation (Signed)
  Anesthesia Post-op Note  Patient: Jennifer Garrett  Procedure(s) Performed: Procedure(s) (LRB) with comments: CARPAL TUNNEL RELEASE (Left) RELEASE TRIGGER FINGER/A-1 PULLEY (Left)  Patient Location: PACU  Anesthesia Type: Bier block  Level of Consciousness: awake, alert  and oriented  Airway and Oxygen Therapy: Patient Spontanous Breathing  Post-op Pain: none  Post-op Assessment: Post-op Vital signs reviewed, Patient's Cardiovascular Status Stable, Respiratory Function Stable, Patent Airway, No signs of Nausea or vomiting and Pain level controlled  Post-op Vital Signs: Reviewed and stable  Complications: No apparent anesthesia complications

## 2012-03-29 NOTE — Op Note (Signed)
dictated number: [id number]

## 2012-03-29 NOTE — Anesthesia Procedure Notes (Signed)
Procedure Name: MAC Date/Time: 03/29/2012 8:55 AM Performed by: Aviya Jarvie D Pre-anesthesia Checklist: Patient identified, Emergency Drugs available, Suction available and Patient being monitored Oxygen Delivery Method: Simple face mask

## 2012-03-29 NOTE — H&P (Signed)
Jennifer Garrett is a 78 year old right hand dominant female referred by Dr. Blenda Mounts for a consultation with respect to pain in her left hand with a feeling of a lump in the palm. This has been present for 6 months. She is complaining of some discomfort in her forearm where a garbage can fell on the volar radial aspect in [place]. He complains of discomfort there going distally. She states she feels as though her elbow is swollen on the left side. She is not complaining of significant discomfort on her right. She has had bilateral shoulder surgery. She is not taking anything for discomfort. She complains of a severe sharp pain with a feeling of numbness in all of the fingers and a feeling of weakness. She states it is getting worse. Activity makes it worse and heat has helped.  Her left middle finger continues to trigger following two injections. She is complaining of numbness and tingling thumb through ring finger. Nerve conductions are positive for carpal tunnel syndrome bilaterally. Past Medical History:   She is allergic to Metformin, Lyrica, and Feldene. She is on Januvia, Simvastatin, Benicar, Glipizide, and Amlodipine. She has had the shoulder surgery on bilateral shoulders.  Family Medical History: Positive for diabetes, heart disease, and high BP.  Social History: She does not smoke or drink. She is single and retired.  Review of Systems: Positive for loss of appetite, glasses, high BP, some asthma, otherwise negative for 14 points.  Jennifer Garrett is an 78 y.o. female.   Chief Complaint: CTS lt with STS lmf HPI: see above  Past Medical History  Diagnosis Date  . Shortness of breath   . Coronary artery disease   . Hypertension   . Diabetes mellitus   . Arthritis   . Carpal tunnel syndrome   . Hyperlipemia   . Asthma     rarely uses neb    Past Surgical History  Procedure Date  . Abdominal hysterectomy   . Cardiac catheterization 2003    stent-  . Shoulder arthroscopy    right and left    History reviewed. No pertinent family history. Social History:  reports that she quit smoking about 34 years ago. She does not have any smokeless tobacco history on file. She reports that she does not drink alcohol or use illicit drugs.  Allergies:  Allergies  Allergen Reactions  . Lyrica (Pregabalin) Swelling  . Feldene (Piroxicam) Rash    Medications Prior to Admission  Medication Sig Dispense Refill  . albuterol (PROVENTIL) (2.5 MG/3ML) 0.083% nebulizer solution Take 2.5 mg by nebulization every 6 (six) hours as needed.      Marland Kitchen amLODipine (NORVASC) 10 MG tablet Take 10 mg by mouth daily.      . calcium carbonate (OS-CAL) 600 MG TABS Take 600 mg by mouth 2 (two) times daily with a meal.      . glipiZIDE (GLUCOTROL) 10 MG tablet Take 10 mg by mouth 2 (two) times daily before a meal.      . hydrochlorothiazide (HYDRODIURIL) 25 MG tablet Take 25 mg by mouth daily.      Marland Kitchen olmesartan (BENICAR) 40 MG tablet Take 40 mg by mouth daily.      . potassium chloride (K-DUR) 10 MEQ tablet Take 10 mEq by mouth 2 (two) times daily.      . simvastatin (ZOCOR) 20 MG tablet Take 20 mg by mouth every evening.      . sitaGLIPtin (JANUVIA) 100 MG tablet Take 100 mg by mouth  daily.        Results for orders placed during the hospital encounter of 03/29/12 (from the past 48 hour(s))  BASIC METABOLIC PANEL     Status: Abnormal   Collection Time   03/27/12 11:30 AM      Component Value Range Comment   Sodium 139  135 - 145 mEq/L    Potassium 3.9  3.5 - 5.1 mEq/L    Chloride 103  96 - 112 mEq/L    CO2 23  19 - 32 mEq/L    Glucose, Bld 286 (*) 70 - 99 mg/dL    BUN 15  6 - 23 mg/dL    Creatinine, Ser 0.84  0.50 - 1.10 mg/dL    Calcium 9.6  8.4 - 10.5 mg/dL    GFR calc non Af Amer 65 (*) >90 mL/min    GFR calc Af Amer 76 (*) >90 mL/min     No results found.   Pertinent items are noted in HPI.  Height 5\' 2"  (1.575 m), weight 173 lb (78.472 kg).  General appearance: alert,  cooperative and appears stated age Head: Normocephalic, without obvious abnormality Neck: no adenopathy Resp: clear to auscultation bilaterally Cardio: regular rate and rhythm, S1, S2 normal, no murmur, click, rub or gallop GI: soft, non-tender; bowel sounds normal; no masses,  no organomegaly Extremities: extremities normal, atraumatic, no cyanosis or edema Pulses: 2+ and symmetric Skin: Skin color, texture, turgor normal. No rashes or lesions Neurologic: Grossly normal Incision/Wound: na  Assessment/Plan DX: CTS left with STS lmf We have recommended that she undergo carpal tunnel release with trigger finger release left middle finger. The pre, peri and post op course are discussed along with risks and complications.  She is aware there is no guarantee with surgery, possibility of infection, recurrence, injury to arteries, nerves and tendons, incomplete relief of symptoms and dystrophy. She has elected to proceed. This is her left middle finger and left hand.  Jennifer Garrett R 03/29/2012, 7:53 AM

## 2012-03-30 ENCOUNTER — Encounter (HOSPITAL_BASED_OUTPATIENT_CLINIC_OR_DEPARTMENT_OTHER): Payer: Self-pay | Admitting: Orthopedic Surgery

## 2012-03-30 LAB — GLUCOSE, CAPILLARY: Glucose-Capillary: 128 mg/dL — ABNORMAL HIGH (ref 70–99)

## 2012-03-30 NOTE — Op Note (Signed)
NAME:  Jennifer Garrett, Jennifer Garrett                 ACCOUNT NO.:  [id number]  MEDICAL RECORD NO.:  [id number]  LOCATION:                                 FACILITY:  PHYSICIAN:  Daryll Brod, M.D.            DATE OF BIRTH:  DATE OF PROCEDURE:  03/29/2012 DATE OF DISCHARGE:                              OPERATIVE REPORT   POSTOPERATIVE DIAGNOSIS:  Carpal tunnel syndrome, left hand, trigger finger, left middle finger.  POSTOPERATIVE DIAGNOSIS:  Carpal tunnel syndrome, left hand, trigger finger, left middle finger.  OPERATION:  Carpal tunnel release, left hand with release of stenosing tenosynovitis, A1 pulley, left middle finger.  SURGEON:  Daryll Brod, M.D.  ANESTHESIA:  Forearm-based IV regional with local infiltration.  ANESTHESIOLOGIST:  Jessy Oto. Albertina Parr, M.D.  HISTORY:  The patient is a 78 year old female with a history of carpal tunnel syndrome.  EMG nerve conduction is positive, which has not responded to conservative treatment.  She has had triggering of her left middle finger, this has been injected on two occasions without relief of symptoms.  She is admitted now for release of each of these two structures.  Pre, peri and postoperative course have been discussed along with risks, complications.  She is aware that there is no guarantee with the surgery; possibility of infection; recurrence of injury to arteries, nerves, tendons; incomplete relief of symptoms and dystrophy.  In the preoperative area, the patient is seen, the extremity marked by both the patient and surgeon, and antibiotic given.  PROCEDURE:  The patient was brought to the operating room where a forearm-based IV regional anesthetic was carried out without difficulty. She was prepped using ChloraPrep, supine position, left arm free.  A 3- minute dry time was allowed.  Time-out taken, confirming the patient and procedure.  An oblique incision was made over the A1 pulley, left middle finger, carried down through the  subcutaneous tissue.  Bleeders were electrocauterized with bipolar, and neurovascular structures were identified and protected.  The A1 pulley was identified, this was released on its radial aspect with small incisions made centrally in A2. Partial tenosynovectomy was performed proximally.  The finger placed through a full range of motion, no further triggering was noted.  The wound was irrigated and then closed with interrupted 4-0 Vicryl Rapide sutures.  A separate incision was then made over the carpal canal, carried down through the subcutaneous tissue.  Bleeders were again electrocauterized.  The palmar fascia was split, superficial palmar arch was identified.  The flexor tendon of the ring and little finger were identified to the ulnar side of the median nerve.  The carpal retinaculum was incised with sharp dissection.  Right angle and Sewall retractor were placed between the skin and forearm fascia.  The fascia was released for approximately 1.5 cm proximal to the wrist crease under direct vision.  Canal was explored.  Tenosynovial tissue was thickened, area compression to the nerve was apparent.  No further lesions were identified.  The wound was irrigated.  The skin was closed with interrupted 4-0 Vicryl Rapide sutures.  Local infiltration with 0.25% Marcaine without epinephrine was given to each of the wounds,  approximately 9 mL was used.  Sterile compressive dressing with fingers free was applied.  On deflation of the tourniquet, all fingers were immediately pinked.  She was taken to the recovery room for observation in satisfactory condition.  She will be discharged to home to return to the [facility] in 1 week, on Vicodin.          ______________________________ Daryll Brod, M.D.     GK/MEDQ  D:  03/29/2012  T:  03/30/2012  Job:  [id number]

## 2012-03-30 NOTE — Addendum Note (Signed)
Addendum  created 03/30/12 1032 by Ernesta Amble Oliver Heitzenrater, CRNA   Modules edited:Anesthesia Responsible Staff

## 2012-03-30 NOTE — Addendum Note (Signed)
Addendum  created 03/30/12 1032 by Ernesta Amble Ghali Morissette, CRNA   Modules edited:Anesthesia Responsible Staff

## 2013-05-29 ENCOUNTER — Ambulatory Visit (INDEPENDENT_AMBULATORY_CARE_PROVIDER_SITE_OTHER): Payer: Medicare Other

## 2013-05-29 VITALS — BP 163/91 | HR 67 | Resp 20 | Ht 62.5 in | Wt 173.0 lb

## 2013-05-29 DIAGNOSIS — E1140 Type 2 diabetes mellitus with diabetic neuropathy, unspecified: Secondary | ICD-10-CM

## 2013-05-29 DIAGNOSIS — B353 Tinea pedis: Secondary | ICD-10-CM

## 2013-05-29 DIAGNOSIS — Q828 Other specified congenital malformations of skin: Secondary | ICD-10-CM

## 2013-05-29 DIAGNOSIS — E1149 Type 2 diabetes mellitus with other diabetic neurological complication: Secondary | ICD-10-CM

## 2013-05-29 DIAGNOSIS — E1142 Type 2 diabetes mellitus with diabetic polyneuropathy: Secondary | ICD-10-CM

## 2013-05-29 DIAGNOSIS — L608 Other nail disorders: Secondary | ICD-10-CM

## 2013-05-29 NOTE — Progress Notes (Signed)
  Subjective:    Patient ID: Ayela Kay, female    DOB: June 05, 1932, 79 y.o.   MRN: [id number] "Do my toenails." Patient also is complaint of a skin lesion oriented break out rash in the medial arch over the talonavicular area medial left foot she had a similar episode several years ago treated with topical cream. No other changes medication her health history. HPI    Review of Systems  Constitutional: Negative.   HENT: Negative.   Respiratory: Negative.   Cardiovascular: Negative.   Gastrointestinal: Negative.   Musculoskeletal: Negative.   Skin: Negative.   Allergic/Immunologic: Negative.   Neurological: Negative.   Hematological: Negative.   Psychiatric/Behavioral: Negative.   All other systems reviewed and are negative.       Objective:   Physical Exam Neurovascular status as follows pedal pulses DP +2/4 bilateral PT pulse one over 4 bilateral skin temperature warm to cool turgor somewhat diminished there is no edema rubor pallor noted no varicosities noted bilateral. Epicritic and proprioceptive sensations in intact but diminished bilateral to the forefoot and digits. Normal plantar response and DTRs noted. There is a macular rash proximally 3 x 5 cm medial arch with talonavicular area left foot with mild pruritus no discharge or drainage noted. Dermatologic the nails thick brittle friable discolored and incurvated tender on palpation and with enclosed shoe wear. Neurologically otherwise unremarkable except for pinch callus hallux IP joint left great toe remainder of exam unremarkable has intact muscle strength noted abnormalities noted    Assessment & Plan:  Diabetes with neuropathy. Patient's nails thick brittle friable criptotic or debridement presence of diabetes complicating factors at this time. Also debridement still keratotic lesion left great toe. Dispensed samples of topical antifungal cream luzu. 6 sampled tubes were dispensed apply daily to the affected area for about 2  week duration contact us if it doesn't resolve within the next 2-3 weeks. Multiple dystrophic probably nails are debrided return in 3 months for continued palliative diabetic foot and nail care  Harriet Masson DPM

## 2013-05-29 NOTE — Patient Instructions (Signed)
Diabetes and Foot Care Diabetes may cause you to have problems because of poor blood supply (circulation) to your feet and legs. This may cause the skin on your feet to become thinner, break easier, and heal more slowly. Your skin may become dry, and the skin may peel and crack. You may also have nerve damage in your legs and feet causing decreased feeling in them. You may not notice minor injuries to your feet that could lead to infections or more serious problems. Taking care of your feet is one of the most important things you can do for yourself.  HOME CARE INSTRUCTIONS  Wear shoes at all times, even in the house. Do not go barefoot. Bare feet are easily injured.  Check your feet daily for blisters, cuts, and redness. If you cannot see the bottom of your feet, use a mirror or ask someone for help.  Wash your feet with warm water (do not use hot water) and mild soap. Then pat your feet and the areas between your toes until they are completely dry. Do not soak your feet as this can dry your skin.  Apply a moisturizing lotion or petroleum jelly (that does not contain alcohol and is unscented) to the skin on your feet and to dry, brittle toenails. Do not apply lotion between your toes.  Trim your toenails straight across. Do not dig under them or around the cuticle. File the edges of your nails with an emery board or nail file.  Do not cut corns or calluses or try to remove them with medicine.  Wear clean socks or stockings every day. Make sure they are not too tight. Do not wear knee-high stockings since they may decrease blood flow to your legs.  Wear shoes that fit properly and have enough cushioning. To break in new shoes, wear them for just a few hours a day. This prevents you from injuring your feet. Always look in your shoes before you put them on to be sure there are no objects inside.  Do not cross your legs. This may decrease the blood flow to your feet.  If you find a minor scrape,  cut, or break in the skin on your feet, keep it and the skin around it clean and dry. These areas may be cleansed with mild soap and water. Do not cleanse the area with peroxide, alcohol, or iodine.  When you remove an adhesive bandage, be sure not to damage the skin around it.  If you have a wound, look at it several times a day to make sure it is healing.  Do not use heating pads or hot water bottles. They may burn your skin. If you have lost feeling in your feet or legs, you may not know it is happening until it is too late.  Make sure your health care provider performs a complete foot exam at least annually or more often if you have foot problems. Report any cuts, sores, or bruises to your health care provider immediately. SEEK MEDICAL CARE IF:   You have an injury that is not healing.  You have cuts or breaks in the skin.  You have an ingrown nail.  You notice redness on your legs or feet.  You feel burning or tingling in your legs or feet.  You have pain or cramps in your legs and feet.  Your legs or feet are numb.  Your feet always feel cold. SEEK IMMEDIATE MEDICAL CARE IF:   There is increasing redness,   swelling, or pain in or around a wound.  There is a red line that goes up your leg.  Pus is coming from a wound.  You develop a fever or as directed by your health care provider.  You notice a bad smell coming from an ulcer or wound. Document Released: 07/02/2000 Document Revised: 03/07/2013 Document Reviewed: 12/12/2012 [employer] Patient Information 2014 [employer]  

## 2013-11-06 ENCOUNTER — Ambulatory Visit (INDEPENDENT_AMBULATORY_CARE_PROVIDER_SITE_OTHER): Payer: Medicare Other

## 2013-11-06 ENCOUNTER — Ambulatory Visit: Payer: Medicare Other

## 2013-11-06 VITALS — BP 162/63 | HR 65 | Resp 16 | Ht 62.5 in | Wt 173.0 lb

## 2013-11-06 DIAGNOSIS — L608 Other nail disorders: Secondary | ICD-10-CM

## 2013-11-06 DIAGNOSIS — E1149 Type 2 diabetes mellitus with other diabetic neurological complication: Secondary | ICD-10-CM

## 2013-11-06 DIAGNOSIS — Q828 Other specified congenital malformations of skin: Secondary | ICD-10-CM

## 2013-11-06 DIAGNOSIS — E1140 Type 2 diabetes mellitus with diabetic neuropathy, unspecified: Secondary | ICD-10-CM

## 2013-11-06 DIAGNOSIS — E1142 Type 2 diabetes mellitus with diabetic polyneuropathy: Secondary | ICD-10-CM

## 2013-11-06 NOTE — Progress Notes (Signed)
   Subjective:    Patient ID: Jennifer Garrett, female    DOB: Mar 02, 1932, 80 y.o.   MRN: [id number]  HPI Comments: Pt presents for debridement of B/L 1 - 5 toenails and diabetic foot evaluation.     Review of Systems no significant changes or findings at this time.     Objective:   Physical Exam Neurovascular status is intact and unchanged patient well-developed well-nourished x3 pedal pulses DP +2/4 , PT one over 4 bilateral capillary refill timed 3-4 seconds all digits. Epicritic and proprioceptive sensations intact although diminished to the toes and plantar forefoot. Normal plantar response and DTRs noted dermatologically skin color pigment normal hair growth absent there is no open wounds ulcerations no secondary infections at this time.       Assessment & Plan:  Assessment diabetes with peripheral neuropathy also dystrophic nails 1 through 5 bilateral debridement at this time return for future palliative care and as-needed basis suggest a 3 month followup for continued palliative nail care contact us in changes or exacerbations occur in the interim.  Harriet Masson DPM

## 2013-11-06 NOTE — Patient Instructions (Signed)
Diabetes and Foot Care Diabetes may cause you to have problems because of poor blood supply (circulation) to your feet and legs. This may cause the skin on your feet to become thinner, break easier, and heal more slowly. Your skin may become dry, and the skin may peel and crack. You may also have nerve damage in your legs and feet causing decreased feeling in them. You may not notice minor injuries to your feet that could lead to infections or more serious problems. Taking care of your feet is one of the most important things you can do for yourself.  HOME CARE INSTRUCTIONS  Wear shoes at all times, even in the house. Do not go barefoot. Bare feet are easily injured.  Check your feet daily for blisters, cuts, and redness. If you cannot see the bottom of your feet, use a mirror or ask someone for help.  Wash your feet with warm water (do not use hot water) and mild soap. Then pat your feet and the areas between your toes until they are completely dry. Do not soak your feet as this can dry your skin.  Apply a moisturizing lotion or petroleum jelly (that does not contain alcohol and is unscented) to the skin on your feet and to dry, brittle toenails. Do not apply lotion between your toes.  Trim your toenails straight across. Do not dig under them or around the cuticle. File the edges of your nails with an emery board or nail file.  Do not cut corns or calluses or try to remove them with medicine.  Wear clean socks or stockings every day. Make sure they are not too tight. Do not wear knee-high stockings since they may decrease blood flow to your legs.  Wear shoes that fit properly and have enough cushioning. To break in new shoes, wear them for just a few hours a day. This prevents you from injuring your feet. Always look in your shoes before you put them on to be sure there are no objects inside.  Do not cross your legs. This may decrease the blood flow to your feet.  If you find a minor scrape,  cut, or break in the skin on your feet, keep it and the skin around it clean and dry. These areas may be cleansed with mild soap and water. Do not cleanse the area with peroxide, alcohol, or iodine.  When you remove an adhesive bandage, be sure not to damage the skin around it.  If you have a wound, look at it several times a day to make sure it is healing.  Do not use heating pads or hot water bottles. They may burn your skin. If you have lost feeling in your feet or legs, you may not know it is happening until it is too late.  Make sure your health care provider performs a complete foot exam at least annually or more often if you have foot problems. Report any cuts, sores, or bruises to your health care provider immediately. SEEK MEDICAL CARE IF:   You have an injury that is not healing.  You have cuts or breaks in the skin.  You have an ingrown nail.  You notice redness on your legs or feet.  You feel burning or tingling in your legs or feet.  You have pain or cramps in your legs and feet.  Your legs or feet are numb.  Your feet always feel cold. SEEK IMMEDIATE MEDICAL CARE IF:   There is increasing redness,   swelling, or pain in or around a wound.  There is a red line that goes up your leg.  Pus is coming from a wound.  You develop a fever or as directed by your health care provider.  You notice a bad smell coming from an ulcer or wound. Document Released: 07/02/2000 Document Revised: 03/07/2013 Document Reviewed: 12/12/2012 [employer] Patient Information 2014 [employer]  

## 2013-11-27 ENCOUNTER — Encounter (HOSPITAL_COMMUNITY): Payer: Self-pay | Admitting: Emergency Medicine

## 2013-11-27 ENCOUNTER — Emergency Department (HOSPITAL_COMMUNITY)
Admission: EM | Admit: 2013-11-27 | Discharge: 2013-11-28 | Disposition: A | Discharge status: Home / Self Care | Payer: Medicare Other | Attending: Emergency Medicine | Admitting: Emergency Medicine

## 2013-11-27 DIAGNOSIS — E119 Type 2 diabetes mellitus without complications: Secondary | ICD-10-CM | POA: Insufficient documentation

## 2013-11-27 DIAGNOSIS — Y929 Unspecified place or not applicable: Secondary | ICD-10-CM | POA: Insufficient documentation

## 2013-11-27 DIAGNOSIS — J45909 Unspecified asthma, uncomplicated: Secondary | ICD-10-CM | POA: Insufficient documentation

## 2013-11-27 DIAGNOSIS — S2239XA Fracture of one rib, unspecified side, initial encounter for closed fracture: Secondary | ICD-10-CM

## 2013-11-27 DIAGNOSIS — S298XXA Other specified injuries of thorax, initial encounter: Secondary | ICD-10-CM | POA: Insufficient documentation

## 2013-11-27 DIAGNOSIS — R296 Repeated falls: Secondary | ICD-10-CM | POA: Insufficient documentation

## 2013-11-27 DIAGNOSIS — Z79899 Other long term (current) drug therapy: Secondary | ICD-10-CM | POA: Insufficient documentation

## 2013-11-27 DIAGNOSIS — I2510 Atherosclerotic heart disease of native coronary artery without angina pectoris: Secondary | ICD-10-CM | POA: Insufficient documentation

## 2013-11-27 DIAGNOSIS — E785 Hyperlipidemia, unspecified: Secondary | ICD-10-CM | POA: Insufficient documentation

## 2013-11-27 DIAGNOSIS — S3981XA Other specified injuries of abdomen, initial encounter: Secondary | ICD-10-CM | POA: Insufficient documentation

## 2013-11-27 DIAGNOSIS — Z8669 Personal history of other diseases of the nervous system and sense organs: Secondary | ICD-10-CM | POA: Insufficient documentation

## 2013-11-27 DIAGNOSIS — W19XXXA Unspecified fall, initial encounter: Secondary | ICD-10-CM

## 2013-11-27 DIAGNOSIS — Z9889 Other specified postprocedural states: Secondary | ICD-10-CM | POA: Insufficient documentation

## 2013-11-27 DIAGNOSIS — S2249XA Multiple fractures of ribs, unspecified side, initial encounter for closed fracture: Secondary | ICD-10-CM | POA: Insufficient documentation

## 2013-11-27 DIAGNOSIS — Y9389 Activity, other specified: Secondary | ICD-10-CM | POA: Insufficient documentation

## 2013-11-27 DIAGNOSIS — Z87891 Personal history of nicotine dependence: Secondary | ICD-10-CM | POA: Insufficient documentation

## 2013-11-27 DIAGNOSIS — Z8739 Personal history of other diseases of the musculoskeletal system and connective tissue: Secondary | ICD-10-CM | POA: Insufficient documentation

## 2013-11-27 DIAGNOSIS — E041 Nontoxic single thyroid nodule: Secondary | ICD-10-CM

## 2013-11-27 DIAGNOSIS — I10 Essential (primary) hypertension: Secondary | ICD-10-CM | POA: Insufficient documentation

## 2013-11-27 NOTE — ED Notes (Signed)
Patient here with complaint of mechanical fall. States that she tripped and fell backwards landing flat on her back. Patient states she takes a blood thinner, but doesn't know what it is and cannot locate list of medications. List on file sounds up to date per patient, but shows no blood thinning agents. Denies warfarin/coumadin and aspirin when asked.

## 2013-11-28 ENCOUNTER — Emergency Department (HOSPITAL_COMMUNITY): Payer: Medicare Other

## 2013-11-28 LAB — CBC WITH DIFFERENTIAL/PLATELET
BASOS ABS: 0.1 10*3/uL (ref 0.0–0.1)
BASOS PCT: 1 % (ref 0–1)
EOS ABS: 0.2 10*3/uL (ref 0.0–0.7)
EOS PCT: 2 % (ref 0–5)
HEMATOCRIT: 34.8 % — AB (ref 36.0–46.0)
Hemoglobin: 11.1 g/dL — ABNORMAL LOW (ref 12.0–15.0)
Lymphocytes Relative: 35 % (ref 12–46)
Lymphs Abs: 2.7 10*3/uL (ref 0.7–4.0)
MCH: 27.3 pg (ref 26.0–34.0)
MCHC: 31.9 g/dL (ref 30.0–36.0)
MCV: 85.7 fL (ref 78.0–100.0)
MONO ABS: 0.7 10*3/uL (ref 0.1–1.0)
Monocytes Relative: 9 % (ref 3–12)
Neutro Abs: 4 10*3/uL (ref 1.7–7.7)
Neutrophils Relative %: 53 % (ref 43–77)
Platelets: 272 10*3/uL (ref 150–400)
RBC: 4.06 MIL/uL (ref 3.87–5.11)
RDW: 13.3 % (ref 11.5–15.5)
WBC: 7.6 10*3/uL (ref 4.0–10.5)

## 2013-11-28 LAB — I-STAT CHEM 8, ED
BUN: 15 mg/dL (ref 6–23)
CALCIUM ION: 1.28 mmol/L (ref 1.13–1.30)
Chloride: 107 mEq/L (ref 96–112)
Creatinine, Ser: 1.1 mg/dL (ref 0.50–1.10)
Glucose, Bld: 112 mg/dL — ABNORMAL HIGH (ref 70–99)
HEMATOCRIT: 36 % (ref 36.0–46.0)
HEMOGLOBIN: 12.2 g/dL (ref 12.0–15.0)
Potassium: 4.3 mEq/L (ref 3.7–5.3)
Sodium: 144 mEq/L (ref 137–147)
TCO2: 25 mmol/L (ref 0–100)

## 2013-11-28 LAB — HEPATIC FUNCTION PANEL
ALT: 11 U/L (ref 0–35)
AST: 19 U/L (ref 0–37)
Albumin: 3.6 g/dL (ref 3.5–5.2)
Alkaline Phosphatase: 56 U/L (ref 39–117)
Bilirubin, Direct: <0.2 mg/dL (ref 0.0–0.3)
Total Bilirubin: 0.2 mg/dL — ABNORMAL LOW (ref 0.3–1.2)
Total Protein: 7.6 g/dL (ref 6.0–8.3)

## 2013-11-28 LAB — URINALYSIS, ROUTINE W REFLEX MICROSCOPIC
Bilirubin Urine: NEGATIVE
GLUCOSE, UA: NEGATIVE mg/dL
Hgb urine dipstick: NEGATIVE
KETONES UR: NEGATIVE mg/dL
LEUKOCYTES UA: NEGATIVE
Nitrite: NEGATIVE
PROTEIN: NEGATIVE mg/dL
Specific Gravity, Urine: 1.02 (ref 1.005–1.030)
Urobilinogen, UA: 1 mg/dL (ref 0.0–1.0)
pH: 6 (ref 5.0–8.0)

## 2013-11-28 MED ORDER — TRAMADOL HCL 50 MG PO TABS: 50.0000 mg | ORAL_TABLET | Freq: Four times a day (QID) | ORAL | Status: DC | PRN

## 2013-11-28 MED ORDER — SODIUM CHLORIDE 0.9 % IV BOLUS (SEPSIS)
500.0000 mL | Freq: Once | INTRAVENOUS | Status: AC
  Administered 2013-11-28: 500 mL via INTRAVENOUS

## 2013-11-28 MED ORDER — IOHEXOL 300 MG/ML  SOLN
100.0000 mL | Freq: Once | INTRAMUSCULAR | Status: AC | PRN
  Administered 2013-11-28: 100 mL via INTRAVENOUS

## 2013-11-28 MED ORDER — SODIUM CHLORIDE 0.9 % IV SOLN
INTRAVENOUS | Status: DC
  Administered 2013-11-28: 02:00:00 via INTRAVENOUS

## 2013-11-28 MED ORDER — TRAMADOL HCL 50 MG PO TABS
50.0000 mg | ORAL_TABLET | Freq: Once | ORAL | Status: AC
  Administered 2013-11-28: 50 mg via ORAL
  Filled 2013-11-28: qty 1

## 2013-11-28 NOTE — ED Notes (Signed)
Called CT. Patient's labs are back. They will come pick up patient.

## 2013-11-28 NOTE — ED Notes (Signed)
Explained need to void for urine sample.

## 2013-11-28 NOTE — Discharge Instructions (Signed)
Rest and try to eat a regular diet. Be careful when you stand up to avoid falling. When you see yourr doctor next week, have him arrange a thyroid ultrasound to further evaluate the thyroid cyst. Return here, if needed, for problems.     Rib Fracture A rib fracture is a break or crack in one of the bones of the ribs. The ribs are a group of long, curved bones that wrap around your chest and attach to your spine. They protect your lungs and other organs in the chest cavity. A broken or cracked rib is often painful, but most do not cause other problems. Most rib fractures heal on their own over time. However, rib fractures can be more serious if multiple ribs are broken or if broken ribs move out of place and push against other structures. CAUSES   A direct blow to the chest. For example, this could happen during contact sports, a car accident, or a fall against a hard object.  Repetitive movements with high force, such as pitching a baseball or having severe coughing spells. SYMPTOMS   Pain when you breathe in or cough.  Pain when someone presses on the injured area. DIAGNOSIS  Your caregiver will perform a physical exam. Various imaging tests may be ordered to confirm the diagnosis and to look for related injuries. These tests may include a chest X-ray, computed tomography (CT), magnetic resonance imaging (MRI), or a bone scan. TREATMENT  Rib fractures usually heal on their own in 1 3 months. The longer healing period is often associated with a continued cough or other aggravating activities. During the healing period, pain control is very important. Medication is usually given to control pain. Hospitalization or surgery may be needed for more severe injuries, such as those in which multiple ribs are broken or the ribs have moved out of place.  HOME CARE INSTRUCTIONS   Avoid strenuous activity and any activities or movements that cause pain. Be careful during activities and avoid bumping  the injured rib.  Gradually increase activity as directed by your caregiver.  Only take over-the-counter or prescription medications as directed by your caregiver. Do not take other medications without asking your caregiver first.  Apply ice to the injured area for the first 1 2 days after you have been treated or as directed by your caregiver. Applying ice helps to reduce inflammation and pain.  Put ice in a plastic bag.  Place a towel between your skin and the bag.   Leave the ice on for 15 20 minutes at a time, every 2 hours while you are awake.  Perform deep breathing as directed by your caregiver. This will help prevent pneumonia, which is a common complication of a broken rib. Your caregiver may instruct you to:  Take deep breaths several times a day.  Try to cough several times a day, holding a pillow against the injured area.  Use a device called an incentive spirometer to practice deep breathing several times a day.  Drink enough fluids to keep your urine clear or pale yellow. This will help you avoid constipation.   Do not wear a rib belt or binder. These restrict breathing, which can lead to pneumonia.  SEEK IMMEDIATE MEDICAL CARE IF:   You have a fever.   You have difficulty breathing or shortness of breath.   You develop a continual cough, or you cough up thick or bloody sputum.  You feel sick to your stomach (nausea), throw up (vomit), or  have abdominal pain.   You have worsening pain not controlled with medications.  MAKE SURE YOU:  Understand these instructions.  Will watch your condition.  Will get help right away if you are not doing well or get worse. Document Released: 07/05/2005 Document Revised: 03/07/2013 Document Reviewed: 09/06/2012 [facility] Patient Information 2014 [place], [place].  Fall Prevention and Home Safety Falls cause injuries and can affect all age groups. It is possible to use preventive measures to significantly decrease the  likelihood of falls. There are many simple measures which can make your home safer and prevent falls. OUTDOORS  Repair cracks and edges of walkways and driveways.  Remove high doorway thresholds.  Trim shrubbery on the main path into your home.  Have good outside lighting.  Clear walkways of tools, rocks, debris, and clutter.  Check that handrails are not broken and are securely fastened. Both sides of steps should have handrails.  Have leaves, snow, and ice cleared regularly.  Use sand or salt on walkways during winter months.  In the garage, clean up grease or oil spills. BATHROOM  Install night lights.  Install grab bars by the toilet and in the tub and shower.  Use non-skid mats or decals in the tub or shower.  Place a plastic non-slip stool in the shower to sit on, if needed.  Keep floors dry and clean up all water on the floor immediately.  Remove soap buildup in the tub or shower on a regular basis.  Secure bath mats with non-slip, double-sided rug tape.  Remove throw rugs and tripping hazards from the floors. BEDROOMS  Install night lights.  Make sure a bedside light is easy to reach.  Do not use oversized bedding.  Keep a telephone by your bedside.  Have a firm chair with side arms to use for getting dressed.  Remove throw rugs and tripping hazards from the floor. KITCHEN  Keep handles on pots and pans turned toward the center of the stove. Use back burners when possible.  Clean up spills quickly and allow time for drying.  Avoid walking on wet floors.  Avoid hot utensils and knives.  Position shelves so they are not too high or low.  Place commonly used objects within easy reach.  If necessary, use a sturdy step stool with a grab bar when reaching.  Keep electrical cables out of the way.  Do not use floor polish or wax that makes floors slippery. If you must use wax, use non-skid floor wax.  Remove throw rugs and tripping hazards from  the floor. STAIRWAYS  Never leave objects on stairs.  Place handrails on both sides of stairways and use them. Fix any loose handrails. Make sure handrails on both sides of the stairways are as long as the stairs.  Check carpeting to make sure it is firmly attached along stairs. Make repairs to worn or loose carpet promptly.  Avoid placing throw rugs at the top or bottom of stairways, or properly secure the rug with carpet tape to prevent slippage. Get rid of throw rugs, if possible.  Have an electrician put in a light switch at the top and bottom of the stairs. OTHER FALL PREVENTION TIPS  Wear low-heel or rubber-soled shoes that are supportive and fit well. Wear closed toe shoes.  When using a stepladder, make sure it is fully opened and both spreaders are firmly locked. Do not climb a closed stepladder.  Add color or contrast paint or tape to grab bars and handrails in  your home. Place contrasting color strips on first and last steps.  Learn and use mobility aids as needed. Install an electrical emergency response system.  Turn on lights to avoid dark areas. Replace light bulbs that burn out immediately. Get light switches that glow.  Arrange furniture to create clear pathways. Keep furniture in the same place.  Firmly attach carpet with non-skid or double-sided tape.  Eliminate uneven floor surfaces.  Select a carpet pattern that does not visually hide the edge of steps.  Be aware of all pets. OTHER HOME SAFETY TIPS  Set the water temperature for 120 F (48.8 C).  Keep emergency numbers on or near the telephone.  Keep smoke detectors on every level of the home and near sleeping areas. Document Released: 06/25/2002 Document Revised: 01/04/2012 Document Reviewed: 09/24/2011 [facility] Patient Information 2014 [place].  Thyroid Cyst The thyroid gland is a butterfly-shaped gland in the middle of the neck, located just below the voice box. It makes thyroid hormone.  Thyroid hormone has an effect on nearly all tissues of your body by regulating your metabolism. Metabolism is the breakdown and use of food that you eat or energy that is stored in your body. Your metabolism affects your heart rate, blood pressure, body temperature, and weight. Thyroid cysts are enlarged fluid filled regions of the thyroid gland. These cysts range in size and may expand and enlarge suddenly. Rapidly expanding cysts may cause pain, difficulty swallowing, and rarely, difficulty breathing. Most cysts of the thyroid are not cancerous (benign). SYMPTOMS Bleeding may occur within the cyst. If the bleeding is severe, the cyst may get larger and produce problems in the neck, including swelling that may produce pain and difficultly swallowing. If the vocal cords are compressed, hoarseness may occur. If the windpipe is compressed, you may have difficulty breathing. DIAGNOSIS  A thyroid cyst is diagnosed through physical exam. The diagnosis can be confirmed by an ultrasound exam of the neck. This creates a picture by bouncing sound waves off the thyroid gland. Sometimes the cysts are drained using a fine needle. The fluid is then sent to the lab where it can be examined. This is done to see if any cells in the fluid are cancerous. If they are found to be cancerous, you will need further treatment.  TREATMENT  If the fluid in your neck does not show evidence of cancer, your caregiver may just want to monitor you with yearly ultrasound exams. Sometimes cysts need to be removed surgically. Document Released: 05/28/2004 Document Revised: 09/27/2011 Document Reviewed: 09/10/2010 [facility] Patient Information 2014 [place], [place].

## 2013-11-28 NOTE — ED Notes (Signed)
Patient ambulated to the restroom and back over 100 ft, with no complaints.

## 2013-11-28 NOTE — ED Provider Notes (Signed)
CSN: [id number]     Arrival date & time 11/27/13  2318 History   First MD Initiated Contact with Patient 11/27/13 2345     Chief Complaint  Patient presents with  . Fall  . Shoulder Pain     (Consider location/radiation/quality/duration/timing/severity/associated sxs/prior Treatment) Patient is a 80 y.o. female presenting with fall and shoulder pain. The history is provided by the patient.  Fall  Shoulder Pain   Patient states that several hours ago, she was making a bed, when she was startled, and this caused her to fall backwards. She injured her right chest in the fall. Later, she was coughing and had increasing pain, so decided to come here. She denies head injury, back injury, nausea, vomiting, weakness, or dizziness. She came here by private vehicle. She was able to eat earlier and denies other illnesses. There's been no recent fever, chills, or change in bowel and urinary habits. She is using her usual medications, without relief. There are no other known modifying factors.  Past Medical History  Diagnosis Date  . Shortness of breath   . Coronary artery disease   . Hypertension   . Diabetes mellitus   . Arthritis   . Carpal tunnel syndrome   . Hyperlipemia   . Asthma     rarely uses neb   Past Surgical History  Procedure Laterality Date  . Abdominal hysterectomy    . Cardiac catheterization  2003    stent-  . Shoulder arthroscopy      right and left  . Carpal tunnel release  03/29/2012    Procedure: CARPAL TUNNEL RELEASE;  Surgeon: Wynonia Sours, MD;  Location: [place];  Service: Orthopedics;  Laterality: Left;  . Trigger finger release  03/29/2012    Procedure: RELEASE TRIGGER FINGER/A-1 PULLEY;  Surgeon: Wynonia Sours, MD;  Location: [place];  Service: Orthopedics;  Laterality: Left;   Family History  Problem Relation Age of Onset  . Heart disease Mother    History  Substance Use Topics  . Smoking status: Former Smoker    Quit  date: 03/24/1978  . Smokeless tobacco: Not on file  . Alcohol Use: No   OB History   Grav Para Term Preterm Abortions TAB SAB Ect Mult Living                 Review of Systems  All other systems reviewed and are negative.     Allergies  Lyrica; Feldene; Orudis; and Vibramycin  Home Medications   Prior to Admission medications   Medication Sig Start Date End Date Taking? Authorizing Provider  albuterol (PROVENTIL HFA;VENTOLIN HFA) 108 (90 BASE) MCG/ACT inhaler Inhale 1 puff into the lungs every 6 (six) hours as needed for wheezing or shortness of breath.   Yes Historical Provider, MD  calcium carbonate (OS-CAL) 600 MG TABS Take 600 mg by mouth 2 (two) times daily with a meal.   Yes Historical Provider, MD  glipiZIDE (GLUCOTROL) 10 MG tablet Take 10 mg by mouth 2 (two) times daily before a meal.   Yes Historical Provider, MD  hydrochlorothiazide (HYDRODIURIL) 25 MG tablet Take 25 mg by mouth daily.   Yes Historical Provider, MD  olmesartan (BENICAR) 40 MG tablet Take 40 mg by mouth daily.   Yes Historical Provider, MD  potassium chloride (K-DUR) 10 MEQ tablet Take 10 mEq by mouth 2 (two) times daily.   Yes Historical Provider, MD  simvastatin (ZOCOR) 20 MG tablet Take 20 mg by  mouth every evening.   Yes Historical Provider, MD  sitaGLIPtin (JANUVIA) 100 MG tablet Take 100 mg by mouth daily.   Yes Historical Provider, MD   BP 144/96  Pulse 67  Temp(Src) 98.6 F (37 C) (Oral)  Resp 18  Ht 5\' 2"  (1.575 m)  SpO2 99% Physical Exam  Nursing note and vitals reviewed. Constitutional: She is oriented to person, place, and time. She appears well-developed and well-nourished.  HENT:  Head: Normocephalic and atraumatic.  Eyes: Conjunctivae and EOM are normal. Pupils are equal, round, and reactive to light.  Neck: Normal range of motion and phonation normal. Neck supple.  Cardiovascular: Normal rate, regular rhythm and intact distal pulses.   Pulmonary/Chest: Effort normal. No  respiratory distress. She has no wheezes.  Decreased breath sounds right chest. Moderate anterior chest wall tenderness, right sided, without crepitation or deformity. No significant tenderness to palpation of the sternum.  Abdominal: Soft. She exhibits no distension and no mass. There is tenderness (Moderate right upper quadrant, and midepigastric tenderness). There is no rebound and no guarding.  Musculoskeletal: Normal range of motion.  Neurological: She is alert and oriented to person, place, and time. She exhibits normal muscle tone.  Skin: Skin is warm and dry.  Psychiatric: She has a normal mood and affect. Her behavior is normal. Judgment and thought content normal.    ED Course  Procedures (including critical care time) Medications  0.9 %  sodium chloride infusion ( Intravenous New Bag/Given 11/28/13 0148)  traMADol (ULTRAM) tablet 50 mg (not administered)  sodium chloride 0.9 % bolus 500 mL (0 mLs Intravenous Stopped 11/28/13 0147)  iohexol (OMNIPAQUE) 300 MG/ML solution 100 mL (100 mLs Intravenous Contrast Given 11/28/13 0121)    Patient Vitals for the past 24 hrs:  BP Temp Temp src Pulse Resp SpO2 Height  11/28/13 0145 144/96 mmHg - - 67 18 99 % -  11/28/13 0100 101/80 mmHg - - 64 - 98 % -  11/28/13 0045 158/58 mmHg - - 63 - 97 % -  11/28/13 0030 150/58 mmHg - - 66 17 98 % -  11/28/13 0000 149/65 mmHg - - 62 20 98 % -  11/28/13 0000 147/54 mmHg - - 61 21 98 % -  11/27/13 2341 160/68 mmHg - - - - - -  11/27/13 2327 - 98.6 F (37 C) Oral 64 18 100 % 5\' 2"  (1.575 m)    2:31 AM Reevaluation with update and discussion. After initial assessment and treatment, an updated evaluation reveals she is comfortable. Findings discussed with patient and daughter. All questions answered. They understand that the patient will need to followup with her PCP, regarding the incidental finding of a thyroid cyst. Richarda Blade    Labs Review Labs Reviewed  CBC WITH DIFFERENTIAL - Abnormal;  Notable for the following:    Hemoglobin 11.1 (*)    HCT 34.8 (*)    All other components within normal limits  HEPATIC FUNCTION PANEL - Abnormal; Notable for the following:    Total Bilirubin 0.2 (*)    All other components within normal limits  I-STAT CHEM 8, ED - Abnormal; Notable for the following:    Glucose, Bld 112 (*)    All other components within normal limits  URINE CULTURE  URINALYSIS, ROUTINE W REFLEX MICROSCOPIC    Imaging Review Ct Chest W Contrast  11/28/2013   CLINICAL DATA:  Trip and fall injury. On blood thinners. Chest pain.  EXAM: CT CHEST, ABDOMEN, AND PELVIS WITH  CONTRAST  TECHNIQUE: Multidetector CT imaging of the chest, abdomen and pelvis was performed following the standard protocol during bolus administration of intravenous contrast.  CONTRAST:  127mL OMNIPAQUE IOHEXOL 300 MG/ML  SOLN  COMPARISON:  None.  FINDINGS: CT CHEST FINDINGS  Heterogeneous nodular enlargement of the thyroid gland bilaterally with dominant hypo enhancing nodule with calcification in the left lobe measuring 2.2 cm diameter. Consider follow-up with nonemergent thyroid ultrasound for further evaluation.  Mild cardiac enlargement. Coronary artery calcifications. Normal caliber thoracic aorta with calcification. Great vessel origins are patent. No significant lymphadenopathy in the chest. The esophagus is decompressed. No abnormal mediastinal fluid collections. No pleural effusions. Atelectasis in the lung bases. No airspace disease or consolidation in the lungs. No pneumothorax. Airways appear patent. Normal alignment of the thoracic spine. No vertebral compression deformities. Sternum appears intact. Nondisplaced fracture of the right anterior fourth rib and left lateral sixth rib.  CT ABDOMEN AND PELVIS FINDINGS  Normal alignment of the lumbar spine. No vertebral compression deformities. No displaced fractures demonstrated in the sacrum, pelvis, or hips.  The liver, spleen, pancreas, adrenal glands,  kidneys, inferior vena cava, and retroperitoneal lymph nodes are unremarkable. The gallbladder is decompressed but there is apparent gallbladder wall thickening and mild pericholecystic edema. No stones are visualized. Calcification and torsion of the abdominal aorta without aneurysm. The stomach and small bowel are not abnormally distended. Scattered stool in the colon without distention. No free air or free fluid in the abdomen. No abnormal abdominal or retroperitoneal fluid collections.  Pelvis: Appendix is not identified. No diverticulitis. Bladder wall is not thickened. Uterus appears surgically absent. No pelvic mass or lymphadenopathy. Calcifications in the right adnexa may represent dystrophic or lymph node calcifications. No free or loculated pelvic fluid collections. Abdominal wall musculature appears intact.  IMPRESSION: Nondisplaced fractures of the right anterior fourth rib and left lateral sixth rib. No other acute posttraumatic changes demonstrated in the chest. Thyroid nodules with dominant left thyroid nodule. Consider follow-up with elective ultrasound.  No acute posttraumatic changes suggested in the abdomen or pelvis. No evidence of solid organ injury or bowel perforation. Possible inflammatory changes in the gallbladder without evidence of coli lithiasis.   Electronically Signed   By: Lucienne Capers M.D.   On: 11/28/2013 01:41   Ct Abdomen Pelvis W Contrast  11/28/2013   CLINICAL DATA:  Trip and fall injury. On blood thinners. Chest pain.  EXAM: CT CHEST, ABDOMEN, AND PELVIS WITH CONTRAST  TECHNIQUE: Multidetector CT imaging of the chest, abdomen and pelvis was performed following the standard protocol during bolus administration of intravenous contrast.  CONTRAST:  165mL OMNIPAQUE IOHEXOL 300 MG/ML  SOLN  COMPARISON:  None.  FINDINGS: CT CHEST FINDINGS  Heterogeneous nodular enlargement of the thyroid gland bilaterally with dominant hypo enhancing nodule with calcification in the left lobe  measuring 2.2 cm diameter. Consider follow-up with nonemergent thyroid ultrasound for further evaluation.  Mild cardiac enlargement. Coronary artery calcifications. Normal caliber thoracic aorta with calcification. Great vessel origins are patent. No significant lymphadenopathy in the chest. The esophagus is decompressed. No abnormal mediastinal fluid collections. No pleural effusions. Atelectasis in the lung bases. No airspace disease or consolidation in the lungs. No pneumothorax. Airways appear patent. Normal alignment of the thoracic spine. No vertebral compression deformities. Sternum appears intact. Nondisplaced fracture of the right anterior fourth rib and left lateral sixth rib.  CT ABDOMEN AND PELVIS FINDINGS  Normal alignment of the lumbar spine. No vertebral compression deformities. No displaced fractures demonstrated in  the sacrum, pelvis, or hips.  The liver, spleen, pancreas, adrenal glands, kidneys, inferior vena cava, and retroperitoneal lymph nodes are unremarkable. The gallbladder is decompressed but there is apparent gallbladder wall thickening and mild pericholecystic edema. No stones are visualized. Calcification and torsion of the abdominal aorta without aneurysm. The stomach and small bowel are not abnormally distended. Scattered stool in the colon without distention. No free air or free fluid in the abdomen. No abnormal abdominal or retroperitoneal fluid collections.  Pelvis: Appendix is not identified. No diverticulitis. Bladder wall is not thickened. Uterus appears surgically absent. No pelvic mass or lymphadenopathy. Calcifications in the right adnexa may represent dystrophic or lymph node calcifications. No free or loculated pelvic fluid collections. Abdominal wall musculature appears intact.  IMPRESSION: Nondisplaced fractures of the right anterior fourth rib and left lateral sixth rib. No other acute posttraumatic changes demonstrated in the chest. Thyroid nodules with dominant left  thyroid nodule. Consider follow-up with elective ultrasound.  No acute posttraumatic changes suggested in the abdomen or pelvis. No evidence of solid organ injury or bowel perforation. Possible inflammatory changes in the gallbladder without evidence of coli lithiasis.   Electronically Signed   By: Lucienne Capers M.D.   On: 11/28/2013 01:41     EKG Interpretation None      MDM   Final diagnoses:  Fall  Rib fractures  Thyroid cyst    Fall was secondary to rib injuries. Doubt serious, chest or abdominal injury. Incidental finding of gallbladder inflammation, is nonspecific. I doubt cholecystitis, as she had no preceding symptoms before the fall.  Nursing Notes Reviewed/ Care Coordinated Applicable Imaging Reviewed Interpretation of Laboratory Data incorporated into ED treatment  The patient appears reasonably screened and/or stabilized for discharge and I doubt any other medical condition or other [facility] requiring further screening, evaluation, or treatment in the ED at this time prior to discharge.  Plan: Home Medications- Tramadol; Home Treatments- rest; return here if the recommended treatment, does not improve the symptoms; Recommended follow up- PCP 1 week, f/u on abnormal thyroid on CT     Richarda Blade, MD 11/28/13 [phone]

## 2013-11-29 LAB — URINE CULTURE
COLONY COUNT: NO GROWTH
CULTURE: NO GROWTH

## 2014-02-05 ENCOUNTER — Ambulatory Visit: Payer: Medicare Other

## 2014-02-12 ENCOUNTER — Ambulatory Visit (INDEPENDENT_AMBULATORY_CARE_PROVIDER_SITE_OTHER): Payer: Medicare Other

## 2014-02-12 DIAGNOSIS — E1149 Type 2 diabetes mellitus with other diabetic neurological complication: Secondary | ICD-10-CM

## 2014-02-12 DIAGNOSIS — E1140 Type 2 diabetes mellitus with diabetic neuropathy, unspecified: Secondary | ICD-10-CM

## 2014-02-12 DIAGNOSIS — E1142 Type 2 diabetes mellitus with diabetic polyneuropathy: Secondary | ICD-10-CM

## 2014-02-12 DIAGNOSIS — L608 Other nail disorders: Secondary | ICD-10-CM

## 2014-02-12 NOTE — Patient Instructions (Signed)
Diabetes and Foot Care Diabetes may cause you to have problems because of poor blood supply (circulation) to your feet and legs. This may cause the skin on your feet to become thinner, break easier, and heal more slowly. Your skin may become dry, and the skin may peel and crack. You may also have nerve damage in your legs and feet causing decreased feeling in them. You may not notice minor injuries to your feet that could lead to infections or more serious problems. Taking care of your feet is one of the most important things you can do for yourself.  HOME CARE INSTRUCTIONS  Wear shoes at all times, even in the house. Do not go barefoot. Bare feet are easily injured.  Check your feet daily for blisters, cuts, and redness. If you cannot see the bottom of your feet, use a mirror or ask someone for help.  Wash your feet with warm water (do not use hot water) and mild soap. Then pat your feet and the areas between your toes until they are completely dry. Do not soak your feet as this can dry your skin.  Apply a moisturizing lotion or petroleum jelly (that does not contain alcohol and is unscented) to the skin on your feet and to dry, brittle toenails. Do not apply lotion between your toes.  Trim your toenails straight across. Do not dig under them or around the cuticle. File the edges of your nails with an emery board or nail file.  Do not cut corns or calluses or try to remove them with medicine.  Wear clean socks or stockings every day. Make sure they are not too tight. Do not wear knee-high stockings since they may decrease blood flow to your legs.  Wear shoes that fit properly and have enough cushioning. To break in new shoes, wear them for just a few hours a day. This prevents you from injuring your feet. Always look in your shoes before you put them on to be sure there are no objects inside.  Do not cross your legs. This may decrease the blood flow to your feet.  If you find a minor scrape,  cut, or break in the skin on your feet, keep it and the skin around it clean and dry. These areas may be cleansed with mild soap and water. Do not cleanse the area with peroxide, alcohol, or iodine.  When you remove an adhesive bandage, be sure not to damage the skin around it.  If you have a wound, look at it several times a day to make sure it is healing.  Do not use heating pads or hot water bottles. They may burn your skin. If you have lost feeling in your feet or legs, you may not know it is happening until it is too late.  Make sure your health care provider performs a complete foot exam at least annually or more often if you have foot problems. Report any cuts, sores, or bruises to your health care provider immediately. SEEK MEDICAL CARE IF:   You have an injury that is not healing.  You have cuts or breaks in the skin.  You have an ingrown nail.  You notice redness on your legs or feet.  You feel burning or tingling in your legs or feet.  You have pain or cramps in your legs and feet.  Your legs or feet are numb.  Your feet always feel cold. SEEK IMMEDIATE MEDICAL CARE IF:   There is increasing redness,   swelling, or pain in or around a wound.  There is a red line that goes up your leg.  Pus is coming from a wound.  You develop a fever or as directed by your health care provider.  You notice a bad smell coming from an ulcer or wound. Document Released: 07/02/2000 Document Revised: 03/07/2013 Document Reviewed: 12/12/2012 [employer] Patient Information 2015 [employer]. This information is not intended to replace advice given to you by your health care provider. Make sure you discuss any questions you have with your health care provider.  

## 2014-02-12 NOTE — Progress Notes (Signed)
   Subjective:    Patient ID: Jennifer Garrett, female    DOB: 1931/08/18, 80 y.o.   MRN: [id number]  HPI Pt presents for debridement of nails Review of Systems no new findings or systemic changes noted     Objective:   Physical Exam Lower extremity objective findings unchanged vascular status is intact pedal pulses palpable DP +2/4 bilateral PT one over 4 bilateral capillary refill time 3 seconds all digits epicritic sensations diminished on Semmes Weinstein testing to the digits forefoot and arch. Normal plantar response and DTRs noted neurologically skin color pigment normal hair growth absent nails criptotic incurvated friable no wounds or ulcerations noted no secondary infections noted       Assessment & Plan:  Assessment this time his diabetes with history peripheral neuropathy and mild angiopathy thick dystrophic probably mycotic nails 1 through 5 bilateral debridement at this time return for future palliative care every 3 months as recommended  Harriet Masson DPM

## 2014-05-14 ENCOUNTER — Ambulatory Visit: Payer: Medicare Other

## 2014-05-14 ENCOUNTER — Ambulatory Visit (INDEPENDENT_AMBULATORY_CARE_PROVIDER_SITE_OTHER): Payer: Medicare Other

## 2014-05-14 DIAGNOSIS — M79673 Pain in unspecified foot: Secondary | ICD-10-CM

## 2014-05-14 DIAGNOSIS — B351 Tinea unguium: Secondary | ICD-10-CM

## 2014-05-14 DIAGNOSIS — E1140 Type 2 diabetes mellitus with diabetic neuropathy, unspecified: Secondary | ICD-10-CM

## 2014-05-14 NOTE — Progress Notes (Signed)
   Subjective:    Patient ID: Jennifer Garrett, female    DOB: [birth year], 80 y.o.   MRN: [id number]  HPI   Patient presents for nail debridement  Review of Systems no new findings or systemic changes noted    Objective:   Physical Exam Neurovascular status intact and unchanged pedal pulses DP +2 PT 1 over 4 bilateral Refill time 4 seconds. Sensation decreased on Sims Weinstein the forefoot and digits consistent with diabetic neuropathy. Nails thick brittle chromic dystrophic and friable 1 through 5 bilateral opening was no ulcers no new changes noted       Assessment & Plan:  Assessment diabetes with history of peripheral neuropathy painful dystrophic frontal mycotic nails 1 through 5 bilateral debrided at this time return for future diabetic foot palliative nail care as needed  Harriet Masson DPM

## 2014-06-17 ENCOUNTER — Emergency Department (HOSPITAL_COMMUNITY)
Admission: EM | Admit: 2014-06-17 | Discharge: 2014-06-17 | Disposition: A | Discharge status: Home / Self Care | Payer: Medicare Other | Attending: Emergency Medicine | Admitting: Emergency Medicine

## 2014-06-17 ENCOUNTER — Emergency Department (HOSPITAL_COMMUNITY): Payer: Medicare Other

## 2014-06-17 ENCOUNTER — Encounter (HOSPITAL_COMMUNITY): Payer: Self-pay | Admitting: Family Medicine

## 2014-06-17 DIAGNOSIS — S2090XA Unspecified superficial injury of unspecified parts of thorax, initial encounter: Secondary | ICD-10-CM | POA: Diagnosis present

## 2014-06-17 DIAGNOSIS — Y9289 Other specified places as the place of occurrence of the external cause: Secondary | ICD-10-CM | POA: Diagnosis not present

## 2014-06-17 DIAGNOSIS — S2020XA Contusion of thorax, unspecified, initial encounter: Secondary | ICD-10-CM | POA: Diagnosis not present

## 2014-06-17 DIAGNOSIS — S20212A Contusion of left front wall of thorax, initial encounter: Secondary | ICD-10-CM

## 2014-06-17 DIAGNOSIS — J45909 Unspecified asthma, uncomplicated: Secondary | ICD-10-CM | POA: Insufficient documentation

## 2014-06-17 DIAGNOSIS — E119 Type 2 diabetes mellitus without complications: Secondary | ICD-10-CM | POA: Diagnosis not present

## 2014-06-17 DIAGNOSIS — I2510 Atherosclerotic heart disease of native coronary artery without angina pectoris: Secondary | ICD-10-CM | POA: Insufficient documentation

## 2014-06-17 DIAGNOSIS — I10 Essential (primary) hypertension: Secondary | ICD-10-CM | POA: Insufficient documentation

## 2014-06-17 DIAGNOSIS — W19XXXA Unspecified fall, initial encounter: Secondary | ICD-10-CM

## 2014-06-17 DIAGNOSIS — Y998 Other external cause status: Secondary | ICD-10-CM | POA: Diagnosis not present

## 2014-06-17 DIAGNOSIS — E785 Hyperlipidemia, unspecified: Secondary | ICD-10-CM | POA: Diagnosis not present

## 2014-06-17 DIAGNOSIS — Z87891 Personal history of nicotine dependence: Secondary | ICD-10-CM | POA: Diagnosis not present

## 2014-06-17 DIAGNOSIS — W1839XA Other fall on same level, initial encounter: Secondary | ICD-10-CM | POA: Insufficient documentation

## 2014-06-17 DIAGNOSIS — Z79899 Other long term (current) drug therapy: Secondary | ICD-10-CM | POA: Diagnosis not present

## 2014-06-17 DIAGNOSIS — Y9389 Activity, other specified: Secondary | ICD-10-CM | POA: Diagnosis not present

## 2014-06-17 DIAGNOSIS — R51 Headache: Secondary | ICD-10-CM | POA: Diagnosis not present

## 2014-06-17 LAB — I-STAT CHEM 8, ED
BUN: 10 mg/dL (ref 6–23)
CALCIUM ION: 1.22 mmol/L (ref 1.13–1.30)
Chloride: 100 mEq/L (ref 96–112)
Creatinine, Ser: 0.8 mg/dL (ref 0.50–1.10)
Glucose, Bld: 204 mg/dL — ABNORMAL HIGH (ref 70–99)
HEMATOCRIT: 38 % (ref 36.0–46.0)
Hemoglobin: 12.9 g/dL (ref 12.0–15.0)
Potassium: 3.6 mEq/L — ABNORMAL LOW (ref 3.7–5.3)
Sodium: 142 mEq/L (ref 137–147)
TCO2: 23 mmol/L (ref 0–100)

## 2014-06-17 LAB — I-STAT TROPONIN, ED: TROPONIN I, POC: 0 ng/mL (ref 0.00–0.08)

## 2014-06-17 MED ORDER — IOHEXOL 300 MG/ML  SOLN
80.0000 mL | Freq: Once | INTRAMUSCULAR | Status: AC | PRN
  Administered 2014-06-17: 80 mL via INTRAVENOUS

## 2014-06-17 MED ORDER — TRAMADOL HCL 50 MG PO TABS
50.0000 mg | ORAL_TABLET | Freq: Once | ORAL | Status: AC
  Administered 2014-06-17: 50 mg via ORAL
  Filled 2014-06-17: qty 1

## 2014-06-17 MED ORDER — TRAMADOL HCL 50 MG PO TABS: 50.0000 mg | ORAL_TABLET | Freq: Four times a day (QID) | ORAL | Status: DC | PRN

## 2014-06-17 NOTE — ED Provider Notes (Signed)
  Physical Exam  BP 174/59 mmHg  Pulse 58  Temp(Src) 98.6 F (37 C) (Oral)  Resp 20  SpO2 98%  Physical Exam  ED Course  Procedures  MDM CT is reassuring. Has a lung nodule that will need to get followed and thyroid nodules that were known to the patient. Will discharge home and will follow with her PCP. Will refill the patient's tramadol      Jasper Riling. Alvino Chapel, MD 06/17/14 1930

## 2014-06-17 NOTE — ED Notes (Signed)
Per pt sts she had a fall yesterday. sts she not sure what happened. Denies hitting head or LOC. sts pain in left rib.

## 2014-06-17 NOTE — Discharge Instructions (Signed)

## 2014-06-17 NOTE — ED Provider Notes (Signed)
CSN: [id number]     Arrival date & time 06/17/14  1218 History   First MD Initiated Contact with Patient 06/17/14 1452     Chief Complaint  Patient presents with  . Fall     (Consider location/radiation/quality/duration/timing/severity/associated sxs/prior Treatment) HPI Comments: She complains of left rib pain since fall yesterday. She is not sure why she fell but was on uneven ground. Denies any dizziness or lightheadedness. No chest pain or shortness of breath. Denies hitting her head or losing consciousness. Her pain is in her neck and left ribs. She has a history of rib fractures several months ago and pain is now worse. She denies tripping but her daughter thinks she may have stumbled. This fall was unwitnessed.  The history is provided by the patient and a relative.    Past Medical History  Diagnosis Date  . Shortness of breath   . Coronary artery disease   . Hypertension   . Diabetes mellitus   . Arthritis   . Carpal tunnel syndrome   . Hyperlipemia   . Asthma     rarely uses neb   Past Surgical History  Procedure Laterality Date  . Abdominal hysterectomy    . Cardiac catheterization  2003    stent-  . Shoulder arthroscopy      right and left  . Carpal tunnel release  03/29/2012    Procedure: CARPAL TUNNEL RELEASE;  Surgeon: Wynonia Sours, MD;  Location: [facility];  Service: Orthopedics;  Laterality: Left;  . Trigger finger release  03/29/2012    Procedure: RELEASE TRIGGER FINGER/A-1 PULLEY;  Surgeon: Wynonia Sours, MD;  Location: [facility];  Service: Orthopedics;  Laterality: Left;   Family History  Problem Relation Age of Onset  . Heart disease Mother    History  Substance Use Topics  . Smoking status: Former Smoker    Quit date: 03/24/1978  . Smokeless tobacco: Not on file  . Alcohol Use: No   OB History    No data available     Review of Systems  Constitutional: Negative for fever, activity change and appetite change.   HENT: Negative for congestion and rhinorrhea.   Respiratory: Negative for chest tightness.   Cardiovascular: Negative for chest pain.  Gastrointestinal: Negative for nausea, vomiting and abdominal pain.  Genitourinary: Negative for dysuria, hematuria, vaginal bleeding and vaginal discharge.  Musculoskeletal: Positive for myalgias, arthralgias and neck pain. Negative for back pain.  Neurological: Negative for dizziness, weakness and headaches.  A complete 10 system review of systems was obtained and all systems are negative except as noted in the HPI and PMH.      Allergies  Lyrica; Feldene; Orudis; and Vibramycin  Home Medications   Prior to Admission medications   Medication Sig Start Date End Date Taking? Authorizing Provider  albuterol (PROVENTIL HFA;VENTOLIN HFA) 108 (90 BASE) MCG/ACT inhaler Inhale 1 puff into the lungs every 6 (six) hours as needed for wheezing or shortness of breath.   Yes Historical Provider, MD  calcium carbonate (OS-CAL) 600 MG TABS Take 600 mg by mouth 2 (two) times daily with a meal.   Yes Historical Provider, MD  glipiZIDE (GLUCOTROL) 10 MG tablet Take 10 mg by mouth 2 (two) times daily before a meal.   Yes Historical Provider, MD  hydrochlorothiazide (HYDRODIURIL) 25 MG tablet Take 25 mg by mouth daily.   Yes Historical Provider, MD  KLOR-CON M20 20 MEQ tablet Take 20 mEq by mouth daily. 06/15/14  Yes Historical Provider, MD  olmesartan (BENICAR) 40 MG tablet Take 40 mg by mouth daily.   Yes Historical Provider, MD  simvastatin (ZOCOR) 20 MG tablet Take 20 mg by mouth every evening.   Yes Historical Provider, MD  sitaGLIPtin (JANUVIA) 100 MG tablet Take 100 mg by mouth daily.   Yes Historical Provider, MD  traMADol (ULTRAM) 50 MG tablet Take 1 tablet (50 mg total) by mouth every 6 (six) hours as needed. 11/28/13  Yes Richarda Blade, MD   BP 195/66 mmHg  Pulse 53  Temp(Src) 98.6 F (37 C) (Oral)  Resp 21  SpO2 99% Physical Exam  Constitutional: She  is oriented to person, place, and time. She appears well-developed and well-nourished. No distress.  HENT:  Head: Normocephalic and atraumatic.  Mouth/Throat: Oropharynx is clear and moist. No oropharyngeal exudate.  Eyes: Conjunctivae and EOM are normal. Pupils are equal, round, and reactive to light.  Neck: Normal range of motion. Neck supple.  Diffuse C spine tenderness  Cardiovascular: Normal rate, regular rhythm, normal heart sounds and intact distal pulses.   No murmur heard. Pulmonary/Chest: Effort normal and breath sounds normal. No respiratory distress. She exhibits tenderness.  TTP L lateral ribs. No ecchymosis or crepitance  Abdominal: Soft. There is no tenderness. There is no rebound and no guarding.  Musculoskeletal: Normal range of motion. She exhibits no edema or tenderness.  No T or L spine tenderness  Neurological: She is alert and oriented to person, place, and time. No cranial nerve deficit. She exhibits normal muscle tone. Coordination normal.  No ataxia on finger to nose bilaterally. No pronator drift. 5/5 strength throughout. CN 2-12 intact. Negative Romberg. Equal grip strength. Sensation intact. Gait is normal.   Skin: Skin is warm.  Psychiatric: She has a normal mood and affect. Her behavior is normal.  Nursing note and vitals reviewed.   ED Course  Procedures (including critical care time) Labs Review Labs Reviewed  I-STAT CHEM 8, ED  I-STAT TROPOININ, ED    Imaging Review Dg Ribs Unilateral W/chest Left  06/17/2014   CLINICAL DATA:  Injury, fell yesterday injuring LEFT lower anterior and lateral ribs, also fell 2 months ago fracturing LEFT ribs, initial encounter  EXAM: LEFT RIBS AND CHEST - 3+ VIEW  COMPARISON:  CT chest 11/28/2013  FINDINGS: Upper normal heart size.  Calcified tortuous aorta.  Mediastinal contours and pulmonary vascularity otherwise normal.  Minimal bibasilar atelectasis.  No infiltrate, pleural effusion or pneumothorax.  BB placed at site  of symptoms lower chest.  Old appearing fracture of the lateral LEFT sixth rib identified.  Slight deformities of the anterior LEFT sixth and seventh ribs identified compatible with old fractures, noted on prior CT.  No definite acute fracture identified.  Diffuse osseous demineralization.  IMPRESSION: Old LEFT rib fractures.  Minimal bibasilar atelectasis.  No acute abnormalities.   Electronically Signed   By: Lavonia Dana M.D.   On: 06/17/2014 13:43     EKG Interpretation   Date/Time:  Monday June 17 2014 17:20:44 EST Ventricular Rate:  55 PR Interval:  173 QRS Duration: 118 QT Interval:  437 QTC Calculation: 418 R Axis:   -27 Text Interpretation:  Sinus rhythm LVH with IVCD and secondary repol abnrm  No significant change was found Confirmed by Wyvonnia Dusky  MD, Eulalio Reamy [phone])  on 06/17/2014 5:24:21 PM      MDM   Final diagnoses:  Fall   Left rib pain after fall. Patient denies syncope or dizziness. Remembers entire  fall. Breath sounds equal. Diffuse C spine pain without neuro deficits.  X-ray negative but shows old rib fractures. Bradycardia to 50-60s.  Has been 40s on previous visits.  No beta blockers on list.   She denies dizziness or lightheadedness.  CT of head, neck, chest pending at time of sign out to Dr. Alvino Chapel.   Date: 06/17/2014  Rate: 51  Rhythm: normal sinus rhythm  QRS Axis: normal  Intervals: normal  ST/T Wave abnormalities: normal  Conduction Disutrbances:none  Narrative Interpretation: LVH  Old EKG Reviewed: unchanged    Ezequiel Essex, MD 06/17/14 1842

## 2014-08-20 ENCOUNTER — Ambulatory Visit: Payer: Medicare Other

## 2014-08-28 ENCOUNTER — Ambulatory Visit (INDEPENDENT_AMBULATORY_CARE_PROVIDER_SITE_OTHER): Payer: Medicare Other | Admitting: Podiatry

## 2014-08-28 ENCOUNTER — Encounter: Payer: Self-pay | Admitting: Podiatry

## 2014-08-28 VITALS — BP 157/76 | HR 75 | Resp 18

## 2014-08-28 DIAGNOSIS — B351 Tinea unguium: Secondary | ICD-10-CM

## 2014-08-28 DIAGNOSIS — E1140 Type 2 diabetes mellitus with diabetic neuropathy, unspecified: Secondary | ICD-10-CM

## 2014-08-28 NOTE — Patient Instructions (Signed)
Diabetes and Foot Care Diabetes may cause you to have problems because of poor blood supply (circulation) to your feet and legs. This may cause the skin on your feet to become thinner, break easier, and heal more slowly. Your skin may become dry, and the skin may peel and crack. You may also have nerve damage in your legs and feet causing decreased feeling in them. You may not notice minor injuries to your feet that could lead to infections or more serious problems. Taking care of your feet is one of the most important things you can do for yourself.  HOME CARE INSTRUCTIONS  Wear shoes at all times, even in the house. Do not go barefoot. Bare feet are easily injured.  Check your feet daily for blisters, cuts, and redness. If you cannot see the bottom of your feet, use a mirror or ask someone for help.  Wash your feet with warm water (do not use hot water) and mild soap. Then pat your feet and the areas between your toes until they are completely dry. Do not soak your feet as this can dry your skin.  Apply a moisturizing lotion or petroleum jelly (that does not contain alcohol and is unscented) to the skin on your feet and to dry, brittle toenails. Do not apply lotion between your toes.  Trim your toenails straight across. Do not dig under them or around the cuticle. File the edges of your nails with an emery board or nail file.  Do not cut corns or calluses or try to remove them with medicine.  Wear clean socks or stockings every day. Make sure they are not too tight. Do not wear knee-high stockings since they may decrease blood flow to your legs.  Wear shoes that fit properly and have enough cushioning. To break in new shoes, wear them for just a few hours a day. This prevents you from injuring your feet. Always look in your shoes before you put them on to be sure there are no objects inside.  Do not cross your legs. This may decrease the blood flow to your feet.  If you find a minor scrape,  cut, or break in the skin on your feet, keep it and the skin around it clean and dry. These areas may be cleansed with mild soap and water. Do not cleanse the area with peroxide, alcohol, or iodine.  When you remove an adhesive bandage, be sure not to damage the skin around it.  If you have a wound, look at it several times a day to make sure it is healing.  Do not use heating pads or hot water bottles. They may burn your skin. If you have lost feeling in your feet or legs, you may not know it is happening until it is too late.  Make sure your health care provider performs a complete foot exam at least annually or more often if you have foot problems. Report any cuts, sores, or bruises to your health care provider immediately. SEEK MEDICAL CARE IF:   You have an injury that is not healing.  You have cuts or breaks in the skin.  You have an ingrown nail.  You notice redness on your legs or feet.  You feel burning or tingling in your legs or feet.  You have pain or cramps in your legs and feet.  Your legs or feet are numb.  Your feet always feel cold. SEEK IMMEDIATE MEDICAL CARE IF:   There is increasing redness,   swelling, or pain in or around a wound.  There is a red line that goes up your leg.  Pus is coming from a wound.  You develop a fever or as directed by your health care provider.  You notice a bad smell coming from an ulcer or wound. Document Released: 07/02/2000 Document Revised: 03/07/2013 Document Reviewed: 12/12/2012 [employer] Patient Information 2015 [employer]. This information is not intended to replace advice given to you by your health care provider. Make sure you discuss any questions you have with your health care provider.  

## 2014-09-02 NOTE — Progress Notes (Signed)
Patient ID: Jennifer Garrett, female   DOB: Nov 30, 1931, 81 y.o.   MRN: [id number]  Subjective: 81 year old female presents the office today with complaints of painful, elongated toenails. She denies any recent redness or drainage from the nail sites. She states that her blood sugar has been "good". No other complaints at this time in no acute changes since last appointment.  Objective: AAO 3, NAD DP/PT pulses palpable bilaterally Protective sensation decreased with Simms Weinstein monofilament, vibratory sensation decreased, Achilles tendon reflex intact. Nails are hypertrophic, dystrophic, elongated, brittle, discolored 10. No surrounding erythema or drainage identified at this time. No open lesions or pre-ulcerative lesions identified. No other areas of tenderness to bilateral lower extremity is. No overlying edema, erythema, increase in warmth bilaterally. No pain with calf compression, swelling, warmth, erythema.  Assessment: 81 year old female with onychomycosis, peripheral neuropathy  Plan: -Nail sharply debrided 10 without complication/bleeding.  -Discussed the importance of daily foot inspection. Follow-up in 3 months or sooner if any problems are to arise. In the meantime, occur to call the office with any questions, concerns, change in symptoms. -

## 2014-11-27 ENCOUNTER — Encounter: Payer: Self-pay | Admitting: Podiatry

## 2014-11-27 ENCOUNTER — Ambulatory Visit (INDEPENDENT_AMBULATORY_CARE_PROVIDER_SITE_OTHER): Payer: Medicare Other | Admitting: Podiatry

## 2014-11-27 VITALS — BP 185/79 | HR 65 | Resp 18

## 2014-11-27 DIAGNOSIS — B351 Tinea unguium: Secondary | ICD-10-CM

## 2014-11-27 DIAGNOSIS — M79673 Pain in unspecified foot: Secondary | ICD-10-CM

## 2014-11-27 DIAGNOSIS — E1140 Type 2 diabetes mellitus with diabetic neuropathy, unspecified: Secondary | ICD-10-CM | POA: Diagnosis not present

## 2014-12-01 NOTE — Progress Notes (Signed)
Patient ID: Jennifer Garrett, female   DOB: 1932-04-30, 81 y.o.   MRN: [id number]  Subjective: Jennifer Garrett presents the office today with complaints of painful, elongated toenails which she cannot trim herslef. She denies any recent redness or drainage from the nail sites. No other complaints at this time in no acute changes since last appointment.  Objective: AAO 3, NAD DP/PT pulses palpable bilaterally Protective sensation decreased with Simms Weinstein monofilament, vibratory sensation decreased, Achilles tendon reflex intact. Nails are hypertrophic, dystrophic, elongated, brittle, discolored 10. No surrounding erythema or drainage identified at this time. No open lesions or pre-ulcerative lesions identified. No other areas of tenderness to bilateral lower extremities. No overlying edema, erythema, increase in warmth bilaterally. No pain with calf compression, swelling, warmth, erythema.  Assessment: 81 year old female with onychomycosis, peripheral neuropathy  Plan: -Treatment options discussed including alternatives, risks, complications. -Nail sharply debrided 10 without complication/bleeding.  -Discussed the importance of daily foot inspection.  -Follow-up in 3 months or sooner if any problems are to arise. In the meantime, occur to call the office with any questions, concerns, change in symptoms.

## 2015-03-05 ENCOUNTER — Ambulatory Visit: Payer: Medicare Other | Admitting: Podiatry

## 2015-03-26 ENCOUNTER — Ambulatory Visit (INDEPENDENT_AMBULATORY_CARE_PROVIDER_SITE_OTHER): Payer: Medicare Other | Admitting: Podiatry

## 2015-03-26 ENCOUNTER — Ambulatory Visit: Payer: Medicare Other | Admitting: Podiatry

## 2015-03-26 DIAGNOSIS — E1140 Type 2 diabetes mellitus with diabetic neuropathy, unspecified: Secondary | ICD-10-CM

## 2015-03-26 DIAGNOSIS — B351 Tinea unguium: Secondary | ICD-10-CM

## 2015-03-26 DIAGNOSIS — Q828 Other specified congenital malformations of skin: Secondary | ICD-10-CM

## 2015-03-26 DIAGNOSIS — M79673 Pain in unspecified foot: Secondary | ICD-10-CM | POA: Diagnosis not present

## 2015-03-26 NOTE — Progress Notes (Signed)
Patient ID: Jennifer Garrett, female   DOB: Mar 17, 1932, 81 y.o.   MRN: [id number]  Subjective: Jennifer Garrett presents the office today with complaints of painful, elongated toenails which she cannot trim herslef. She denies any recent redness or drainage from the nail sites. No other complaints at this time in no acute changes since last appointment.  Objective: AAO 3, NAD DP/PT pulses palpable bilaterally Protective sensation decreased with Simms Weinstein monofilament, vibratory sensation decreased, Achilles tendon reflex intact. Nails are hypertrophic, dystrophic, elongated, brittle, discolored 10. No surrounding erythema or drainage identified at this time. No open lesions or pre-ulcerative lesions identified. No other areas of tenderness to bilateral lower extremities. No overlying edema, erythema, increase in warmth bilaterally. No pain with calf compression, swelling, warmth, erythema.  Assessment: 81 year old female with onychomycosis, peripheral neuropathy  Plan: -Treatment options discussed including alternatives, risks, complications. -Nail sharply debrided 10 without complication/bleeding.  -Discussed the importance of daily foot inspection.  -Follow-up in 3 months or sooner if any problems are to arise. In the meantime, occur to call the office with any questions, concerns, change in symptoms.

## 2015-07-01 ENCOUNTER — Ambulatory Visit: Payer: Medicare Other | Admitting: Podiatry

## 2015-08-12 ENCOUNTER — Ambulatory Visit (INDEPENDENT_AMBULATORY_CARE_PROVIDER_SITE_OTHER): Payer: Medicare Other | Admitting: Podiatry

## 2015-08-12 ENCOUNTER — Encounter: Payer: Self-pay | Admitting: Podiatry

## 2015-08-12 DIAGNOSIS — E1140 Type 2 diabetes mellitus with diabetic neuropathy, unspecified: Secondary | ICD-10-CM

## 2015-08-12 DIAGNOSIS — M79673 Pain in unspecified foot: Secondary | ICD-10-CM

## 2015-08-12 DIAGNOSIS — B351 Tinea unguium: Secondary | ICD-10-CM

## 2015-08-12 NOTE — Progress Notes (Signed)
Patient ID: Jennifer Garrett, female   DOB: May 22, 1932, 82 y.o.   MRN: [id number] Complaint:  Visit Type: Patient returns to my office for continued preventative foot care services. Complaint: Patient states" my nails have grown long and thick and become painful to walk and wear shoes" Patient has been diagnosed with DM with no foot complications. The patient presents for preventative foot care services. No changes to ROS  Podiatric Exam: Vascular: dorsalis pedis and posterior tibial pulses are palpable bilateral. Capillary return is immediate. Temperature gradient is WNL. Skin turgor WNL  Sensorium: Decreased  Semmes Weinstein monofilament test. Normal tactile sensation bilaterally. Nail Exam: Pt has thick disfigured discolored nails with subungual debris noted bilateral entire nail hallux through fifth toenails Ulcer Exam: There is no evidence of ulcer or pre-ulcerative changes or infection. Orthopedic Exam: Muscle tone and strength are WNL. No limitations in general ROM. No crepitus or effusions noted. Foot type and digits show no abnormalities. Bony prominences are unremarkable. Skin: No Porokeratosis. No infection or ulcers  Diagnosis:  Onychomycosis, , Pain in right toe, pain in left toes  Treatment & Plan Procedures and Treatment: Consent by patient was obtained for treatment procedures. The patient understood the discussion of treatment and procedures well. All questions were answered thoroughly reviewed. Debridement of mycotic and hypertrophic toenails, 1 through 5 bilateral and clearing of subungual debris. No ulceration, no infection noted.  Return Visit-Office Procedure: Patient instructed to return to the office for a follow up visit 3 months for continued evaluation and treatment.    Gardiner Barefoot DPM

## 2015-11-11 ENCOUNTER — Ambulatory Visit (INDEPENDENT_AMBULATORY_CARE_PROVIDER_SITE_OTHER): Payer: Medicare Other | Admitting: Podiatry

## 2015-11-11 ENCOUNTER — Encounter: Payer: Self-pay | Admitting: Podiatry

## 2015-11-11 DIAGNOSIS — B351 Tinea unguium: Secondary | ICD-10-CM

## 2015-11-11 DIAGNOSIS — E1140 Type 2 diabetes mellitus with diabetic neuropathy, unspecified: Secondary | ICD-10-CM

## 2015-11-11 DIAGNOSIS — M79673 Pain in unspecified foot: Secondary | ICD-10-CM | POA: Diagnosis not present

## 2015-11-11 NOTE — Progress Notes (Signed)
Patient ID: Jennifer Garrett, female   DOB: 13-Jul-1932, 82 y.o.   MRN: [id number] Complaint:  Visit Type: Patient returns to my office for continued preventative foot care services. Complaint: Patient states" my nails have grown long and thick and become painful to walk and wear shoes" Patient has been diagnosed with DM with no foot complications. The patient presents for preventative foot care services. No changes to ROS  Podiatric Exam: Vascular: dorsalis pedis and posterior tibial pulses are palpable bilateral. Capillary return is immediate. Temperature gradient is WNL. Skin turgor WNL  Sensorium: Decreased  Semmes Weinstein monofilament test. Normal tactile sensation bilaterally. Nail Exam: Pt has thick disfigured discolored nails with subungual debris noted bilateral entire nail hallux through fifth toenails Ulcer Exam: There is no evidence of ulcer or pre-ulcerative changes or infection. Orthopedic Exam: Muscle tone and strength are WNL. No limitations in general ROM. No crepitus or effusions noted. Foot type and digits show no abnormalities. Bony prominences are unremarkable. Skin: No Porokeratosis. No infection or ulcers  Diagnosis:  Onychomycosis, , Pain in right toe, pain in left toes  Treatment & Plan Procedures and Treatment: Consent by patient was obtained for treatment procedures. The patient understood the discussion of treatment and procedures well. All questions were answered thoroughly reviewed. Debridement of mycotic and hypertrophic toenails, 1 through 5 bilateral and clearing of subungual debris. No ulceration, no infection noted.  Return Visit-Office Procedure: Patient instructed to return to the office for a follow up visit 3 months for continued evaluation and treatment.    Gardiner Barefoot DPM

## 2015-11-20 ENCOUNTER — Emergency Department (HOSPITAL_COMMUNITY)
Admission: EM | Admit: 2015-11-20 | Discharge: 2015-11-21 | Disposition: A | Discharge status: Home / Self Care | Payer: Medicare Other | Attending: Emergency Medicine | Admitting: Emergency Medicine

## 2015-11-20 ENCOUNTER — Encounter (HOSPITAL_COMMUNITY): Payer: Self-pay

## 2015-11-20 ENCOUNTER — Emergency Department (HOSPITAL_COMMUNITY): Payer: Medicare Other

## 2015-11-20 DIAGNOSIS — Z87891 Personal history of nicotine dependence: Secondary | ICD-10-CM | POA: Insufficient documentation

## 2015-11-20 DIAGNOSIS — J45909 Unspecified asthma, uncomplicated: Secondary | ICD-10-CM | POA: Diagnosis not present

## 2015-11-20 DIAGNOSIS — Z9889 Other specified postprocedural states: Secondary | ICD-10-CM | POA: Diagnosis not present

## 2015-11-20 DIAGNOSIS — Z7984 Long term (current) use of oral hypoglycemic drugs: Secondary | ICD-10-CM | POA: Diagnosis not present

## 2015-11-20 DIAGNOSIS — I10 Essential (primary) hypertension: Secondary | ICD-10-CM | POA: Insufficient documentation

## 2015-11-20 DIAGNOSIS — Z7982 Long term (current) use of aspirin: Secondary | ICD-10-CM | POA: Insufficient documentation

## 2015-11-20 DIAGNOSIS — E785 Hyperlipidemia, unspecified: Secondary | ICD-10-CM | POA: Insufficient documentation

## 2015-11-20 DIAGNOSIS — M1990 Unspecified osteoarthritis, unspecified site: Secondary | ICD-10-CM | POA: Diagnosis not present

## 2015-11-20 DIAGNOSIS — N644 Mastodynia: Secondary | ICD-10-CM

## 2015-11-20 DIAGNOSIS — I2510 Atherosclerotic heart disease of native coronary artery without angina pectoris: Secondary | ICD-10-CM | POA: Insufficient documentation

## 2015-11-20 DIAGNOSIS — Z79899 Other long term (current) drug therapy: Secondary | ICD-10-CM | POA: Insufficient documentation

## 2015-11-20 DIAGNOSIS — Z8669 Personal history of other diseases of the nervous system and sense organs: Secondary | ICD-10-CM | POA: Diagnosis not present

## 2015-11-20 DIAGNOSIS — E119 Type 2 diabetes mellitus without complications: Secondary | ICD-10-CM | POA: Diagnosis not present

## 2015-11-20 DIAGNOSIS — N611 Abscess of the breast and nipple: Secondary | ICD-10-CM | POA: Diagnosis not present

## 2015-11-20 LAB — BASIC METABOLIC PANEL
Anion gap: 9 (ref 5–15)
BUN: 12 mg/dL (ref 6–20)
CALCIUM: 9.5 mg/dL (ref 8.9–10.3)
CHLORIDE: 106 mmol/L (ref 101–111)
CO2: 27 mmol/L (ref 22–32)
Creatinine, Ser: 0.95 mg/dL (ref 0.44–1.00)
GFR calc Af Amer: >60 mL/min (ref >60)
GFR, EST NON AFRICAN AMERICAN: 55 mL/min — AB (ref >60)
GLUCOSE: 113 mg/dL — AB (ref 65–99)
Potassium: 4.3 mmol/L (ref 3.5–5.1)
SODIUM: 142 mmol/L (ref 135–145)

## 2015-11-20 LAB — CBC WITH DIFFERENTIAL/PLATELET
BASOS ABS: 0 10*3/uL (ref 0.0–0.1)
BASOS PCT: 1 %
EOS PCT: 4 %
Eosinophils Absolute: 0.3 10*3/uL (ref 0.0–0.7)
HCT: 35.6 % — ABNORMAL LOW (ref 36.0–46.0)
Hemoglobin: 11.4 g/dL — ABNORMAL LOW (ref 12.0–15.0)
Lymphocytes Relative: 55 %
Lymphs Abs: 4.6 10*3/uL — ABNORMAL HIGH (ref 0.7–4.0)
MCH: 27.5 pg (ref 26.0–34.0)
MCHC: 32 g/dL (ref 30.0–36.0)
MCV: 86 fL (ref 78.0–100.0)
MONO ABS: 0.7 10*3/uL (ref 0.1–1.0)
Monocytes Relative: 9 %
NEUTROS ABS: 2.6 10*3/uL (ref 1.7–7.7)
Neutrophils Relative %: 31 %
PLATELETS: 289 10*3/uL (ref 150–400)
RBC: 4.14 MIL/uL (ref 3.87–5.11)
RDW: 12.9 % (ref 11.5–15.5)
WBC: 8.2 10*3/uL (ref 4.0–10.5)

## 2015-11-20 LAB — CBG MONITORING, ED: GLUCOSE-CAPILLARY: 162 mg/dL — AB (ref 65–99)

## 2015-11-20 MED ORDER — CLINDAMYCIN HCL 150 MG PO CAPS
300.0000 mg | ORAL_CAPSULE | Freq: Once | ORAL | Status: AC
  Administered 2015-11-21: 300 mg via ORAL
  Filled 2015-11-20: qty 2

## 2015-11-20 MED ORDER — CLINDAMYCIN HCL 300 MG PO CAPS: 300.0000 mg | ORAL_CAPSULE | Freq: Four times a day (QID) | ORAL | Status: DC

## 2015-11-20 NOTE — ED Notes (Signed)
Checked patient blood sugar it was 162 notified RN of blood sugar

## 2015-11-20 NOTE — ED Notes (Signed)
Pt here with large draining abscess under right breast that she noticed about a month ago. Denies n/v/d

## 2015-11-20 NOTE — Discharge Instructions (Signed)
Call [facility] tomorrow. Call your Primary Doctor tomorrow as well. It is very important to have this drained to get the fluid out as well as make sure it's an abscess

## 2015-11-20 NOTE — ED Notes (Signed)
Patient transported to Ultrasound 

## 2015-11-20 NOTE — ED Notes (Signed)
Pt denies pain at this time, states it "comes and goes".

## 2015-11-20 NOTE — ED Provider Notes (Signed)
CSN: [id number]     Arrival date & time 11/20/15  1821 History   First MD Initiated Contact with Patient 11/20/15 2146     Chief Complaint  Patient presents with  . Abscess     (Consider location/radiation/quality/duration/timing/severity/associated sxs/prior Treatment) HPI  82 year old female presents with a lesion/mass just inferior to her right breast. Has been present for about one month. She first noticed an area after she fell in the backyard and paying her chest on the stent. She felt a small knot there. Has not had this checked out since. Tonight it had bleeding that has now stopped. The bleeding concerned her and so she came to the ER to get it checked out. It does cause pain although currently is mild. Every once while she gets an occasional sharp pain. No purulent drainage. No fevers. States she had a normal mammogram last year.   Past Medical History  Diagnosis Date  . Shortness of breath   . Coronary artery disease   . Hypertension   . Diabetes mellitus   . Arthritis   . Carpal tunnel syndrome   . Hyperlipemia   . Asthma     rarely uses neb   Past Surgical History  Procedure Laterality Date  . Abdominal hysterectomy    . Cardiac catheterization  2003    stent-  . Shoulder arthroscopy      right and left  . Carpal tunnel release  03/29/2012    Procedure: CARPAL TUNNEL RELEASE;  Surgeon: Wynonia Sours, MD;  Location: [place];  Service: Orthopedics;  Laterality: Left;  . Trigger finger release  03/29/2012    Procedure: RELEASE TRIGGER FINGER/A-1 PULLEY;  Surgeon: Wynonia Sours, MD;  Location: [place];  Service: Orthopedics;  Laterality: Left;   Family History  Problem Relation Age of Onset  . Heart disease Mother    Social History  Substance Use Topics  . Smoking status: Former Smoker    Quit date: 03/24/1978  . Smokeless tobacco: None  . Alcohol Use: No   OB History    No data available     Review of Systems    Constitutional: Negative for fever.  Skin: Positive for color change and wound.      Allergies  Lisinopril; Lyrica; Feldene; Orudis; and Vibramycin  Home Medications   Prior to Admission medications   Medication Sig Start Date End Date Taking? Authorizing Provider  albuterol (PROVENTIL HFA;VENTOLIN HFA) 108 (90 BASE) MCG/ACT inhaler Inhale 1 puff into the lungs every 6 (six) hours as needed for wheezing or shortness of breath.    Historical Provider, MD  aspirin 325 MG tablet Take 325 mg by mouth daily.    Historical Provider, MD  calcium carbonate (OS-CAL) 600 MG TABS Take 600 mg by mouth 2 (two) times daily with a meal.    Historical Provider, MD  glipiZIDE (GLUCOTROL XL) 10 MG 24 hr tablet 10 mg.    Historical Provider, MD  glipiZIDE (GLUCOTROL) 10 MG tablet Take 10 mg by mouth 2 (two) times daily before a meal.    Historical Provider, MD  hydrochlorothiazide (HYDRODIURIL) 25 MG tablet Take 25 mg by mouth daily.    Historical Provider, MD  KLOR-CON M20 20 MEQ tablet Take 20 mEq by mouth daily. 06/15/14   Historical Provider, MD  olmesartan (BENICAR) 40 MG tablet Take 40 mg by mouth daily.    Historical Provider, MD  simvastatin (ZOCOR) 20 MG tablet Take 20 mg by mouth every  evening.    Historical Provider, MD  sitaGLIPtin (JANUVIA) 100 MG tablet Take 100 mg by mouth daily.    Historical Provider, MD  traMADol (ULTRAM) 50 MG tablet Take 1 tablet (50 mg total) by mouth every 6 (six) hours as needed. 06/17/14   Davonna Belling, MD   BP 181/96 mmHg  Pulse 60  Temp(Src) 98.6 F (37 C) (Oral)  Resp 16  SpO2 99% Physical Exam  Constitutional: She is oriented to person, place, and time. She appears well-developed and well-nourished.  HENT:  Head: Normocephalic and atraumatic.  Right Ear: External ear normal.  Left Ear: External ear normal.  Nose: Nose normal.  Eyes: Right eye exhibits no discharge. Left eye exhibits no discharge.  Cardiovascular: Normal rate, regular rhythm and  normal heart sounds.   Pulmonary/Chest: Effort normal and breath sounds normal.    Abdominal: She exhibits no distension.  Neurological: She is alert and oriented to person, place, and time.  Skin: Skin is warm and dry.  Nursing note and vitals reviewed.   ED Course  Procedures (including critical care time) Labs Review Labs Reviewed  BASIC METABOLIC PANEL - Abnormal; Notable for the following:    Glucose, Bld 113 (*)    GFR calc non Af Amer 55 (*)    All other components within normal limits  CBC WITH DIFFERENTIAL/PLATELET - Abnormal; Notable for the following:    Hemoglobin 11.4 (*)    HCT 35.6 (*)    Lymphs Abs 4.6 (*)    All other components within normal limits  CBG MONITORING, ED - Abnormal; Notable for the following:    Glucose-Capillary 162 (*)    All other components within normal limits    Imaging Review US Breast Ltd Uni Right Inc Axilla  11/20/2015  CLINICAL DATA:  Right breast pain for 2 weeks, now with drainage at 6 o'clock. EXAM: ULTRASOUND OF THE right BREAST COMPARISON:  None FINDINGS: Targeted ultrasound is performed, showing a 3.7 x 4.4 x 3.5 cm irregular hypoechoic complex collection in the 6 o'clock position of the right breast. No internal vascularity on color Doppler. IMPRESSION: The abnormality in the right breast 6 o'clock position has sonographic features consistent with an abscess, measuring up to 4.4 cm. Neoplasm is not entirely excluded, and follow-up is necessary. RECOMMENDATION: Recommend follow-up ultrasonography after treating the presumed abscess, to confirm complete resolution and exclude neoplasm. A follow-up interval of 4-8 weeks would be reasonable. Electronically Signed   By: Andreas Newport M.D.   On: 11/20/2015 23:09   I have personally reviewed and evaluated these images and lab results as part of my medical decision-making.   EKG Interpretation None      MDM   Final diagnoses:  Breast pain, right  Abscess of breast, right     Radiology feels like this is an abscess. She does not appear septic. No fevers, normal WBC. Mild tenderness but not as much as you would expect for an abscess. D/w Dr. Rosendo Gros of gen surgery, recommends IR drainage or breast center drainage with antibiotics. Dr. Alroy Dust of radiology feels like this could be an IR procedure, otherwise referral to breast center. Have ordered an [place] abscess order for outpatient and given breast center number. Also advised to f/u closely with PCP. Discussed return precautions.     Sherwood Gambler, MD 11/20/15 [phone]

## 2015-11-21 DIAGNOSIS — N611 Abscess of the breast and nipple: Secondary | ICD-10-CM | POA: Diagnosis not present

## 2015-11-21 NOTE — ED Notes (Signed)
Discharge instructions reviewed - voiced understanding 

## 2015-12-18 ENCOUNTER — Other Ambulatory Visit: Payer: Self-pay | Admitting: Internal Medicine

## 2015-12-18 DIAGNOSIS — N611 Abscess of the breast and nipple: Secondary | ICD-10-CM

## 2016-01-05 ENCOUNTER — Ambulatory Visit
Admission: RE | Admit: 2016-01-05 | Discharge: 2016-01-05 | Disposition: A | Discharge status: Home / Self Care | Payer: Medicare Other | Source: Ambulatory Visit | Attending: Internal Medicine | Admitting: Internal Medicine

## 2016-01-05 ENCOUNTER — Other Ambulatory Visit: Payer: Self-pay | Admitting: Internal Medicine

## 2016-01-05 DIAGNOSIS — N611 Abscess of the breast and nipple: Secondary | ICD-10-CM

## 2016-01-06 ENCOUNTER — Ambulatory Visit
Admission: RE | Admit: 2016-01-06 | Discharge: 2016-01-06 | Disposition: A | Discharge status: Home / Self Care | Payer: Medicare Other | Source: Ambulatory Visit | Attending: Internal Medicine | Admitting: Internal Medicine

## 2016-01-06 ENCOUNTER — Other Ambulatory Visit: Payer: Self-pay | Admitting: Internal Medicine

## 2016-01-06 DIAGNOSIS — N611 Abscess of the breast and nipple: Secondary | ICD-10-CM

## 2016-01-12 ENCOUNTER — Other Ambulatory Visit: Payer: Self-pay | Admitting: General Surgery

## 2016-01-13 ENCOUNTER — Other Ambulatory Visit: Payer: Self-pay | Admitting: General Surgery

## 2016-01-13 DIAGNOSIS — C50911 Malignant neoplasm of unspecified site of right female breast: Secondary | ICD-10-CM

## 2016-01-13 DIAGNOSIS — C773 Secondary and unspecified malignant neoplasm of axilla and upper limb lymph nodes: Principal | ICD-10-CM

## 2016-01-14 ENCOUNTER — Telehealth: Payer: Self-pay | Admitting: Oncology

## 2016-01-14 ENCOUNTER — Encounter: Payer: Self-pay | Admitting: Oncology

## 2016-01-14 NOTE — Telephone Encounter (Signed)
Appointment with GM on 7/6 at 4:30pm. Spoke to patient's daughter and she agreed. Aware to arrive 30 minutes early. Location given. Letter to referring.

## 2016-01-16 ENCOUNTER — Telehealth: Payer: Self-pay | Admitting: *Deleted

## 2016-01-16 NOTE — Telephone Encounter (Signed)
Received appt date/time from Andrea.  Mailed new pt packet to pt.  

## 2016-01-21 ENCOUNTER — Other Ambulatory Visit: Payer: Self-pay

## 2016-01-22 ENCOUNTER — Ambulatory Visit (HOSPITAL_BASED_OUTPATIENT_CLINIC_OR_DEPARTMENT_OTHER): Payer: Medicare Other | Admitting: Oncology

## 2016-01-22 ENCOUNTER — Encounter: Payer: Self-pay | Admitting: Oncology

## 2016-01-22 ENCOUNTER — Other Ambulatory Visit (HOSPITAL_BASED_OUTPATIENT_CLINIC_OR_DEPARTMENT_OTHER): Payer: Medicare Other

## 2016-01-22 ENCOUNTER — Other Ambulatory Visit: Payer: Self-pay | Admitting: *Deleted

## 2016-01-22 VITALS — BP 142/62 | HR 65 | Temp 98.6°F | Resp 18 | Ht 62.0 in | Wt 166.0 lb

## 2016-01-22 DIAGNOSIS — Z171 Estrogen receptor negative status [ER-]: Secondary | ICD-10-CM | POA: Diagnosis not present

## 2016-01-22 DIAGNOSIS — C50311 Malignant neoplasm of lower-inner quadrant of right female breast: Secondary | ICD-10-CM | POA: Insufficient documentation

## 2016-01-22 DIAGNOSIS — T148XXA Other injury of unspecified body region, initial encounter: Secondary | ICD-10-CM

## 2016-01-22 DIAGNOSIS — C773 Secondary and unspecified malignant neoplasm of axilla and upper limb lymph nodes: Secondary | ICD-10-CM

## 2016-01-22 DIAGNOSIS — S21009A Unspecified open wound of unspecified breast, initial encounter: Secondary | ICD-10-CM

## 2016-01-22 DIAGNOSIS — L089 Local infection of the skin and subcutaneous tissue, unspecified: Secondary | ICD-10-CM

## 2016-01-22 HISTORY — DX: Malignant neoplasm of lower-inner quadrant of right female breast: C50.311

## 2016-01-22 LAB — COMPREHENSIVE METABOLIC PANEL
ALBUMIN: 3.6 g/dL (ref 3.5–5.0)
ALT: 10 U/L (ref 0–55)
ANION GAP: 10 meq/L (ref 3–11)
AST: 20 U/L (ref 5–34)
Alkaline Phosphatase: 65 U/L (ref 40–150)
BUN: 19.2 mg/dL (ref 7.0–26.0)
CHLORIDE: 105 meq/L (ref 98–109)
CO2: 25 meq/L (ref 22–29)
Calcium: 9.9 mg/dL (ref 8.4–10.4)
Creatinine: 1.1 mg/dL (ref 0.6–1.1)
EGFR: 54 mL/min/{1.73_m2} — AB (ref >90)
Glucose: 150 mg/dl — ABNORMAL HIGH (ref 70–140)
Potassium: 3.9 mEq/L (ref 3.5–5.1)
Sodium: 141 mEq/L (ref 136–145)
TOTAL PROTEIN: 8.1 g/dL (ref 6.4–8.3)
Total Bilirubin: 0.45 mg/dL (ref 0.20–1.20)

## 2016-01-22 LAB — CBC WITH DIFFERENTIAL/PLATELET
BASO%: 0.8 % (ref 0.0–2.0)
Basophils Absolute: 0.1 10*3/uL (ref 0.0–0.1)
EOS ABS: 0.2 10*3/uL (ref 0.0–0.5)
EOS%: 2.8 % (ref 0.0–7.0)
HCT: 36.6 % (ref 34.8–46.6)
HEMOGLOBIN: 12.1 g/dL (ref 11.6–15.9)
LYMPH#: 3.3 10*3/uL (ref 0.9–3.3)
LYMPH%: 44.1 % (ref 14.0–49.7)
MCH: 27.8 pg (ref 25.1–34.0)
MCHC: 33.1 g/dL (ref 31.5–36.0)
MCV: 84.1 fL (ref 79.5–101.0)
MONO#: 0.9 10*3/uL (ref 0.1–0.9)
MONO%: 11.6 % (ref 0.0–14.0)
NEUT%: 40.7 % (ref 38.4–76.8)
NEUTROS ABS: 3.1 10*3/uL (ref 1.5–6.5)
PLATELETS: 300 10*3/uL (ref 145–400)
RBC: 4.35 10*6/uL (ref 3.70–5.45)
RDW: 12.6 % (ref 11.2–14.5)
WBC: 7.5 10*3/uL (ref 3.9–10.3)

## 2016-01-22 MED ORDER — CAPECITABINE 500 MG PO TABS: 1500.0000 mg | ORAL_TABLET | Freq: Two times a day (BID) | ORAL | Status: DC

## 2016-01-22 MED ORDER — CEPHALEXIN 500 MG PO CAPS: 500.0000 mg | ORAL_CAPSULE | Freq: Two times a day (BID) | ORAL | Status: DC

## 2016-01-22 NOTE — Progress Notes (Addendum)
.  [place]  [phone] [phone]     ID: Jennifer Garrett DOB: [birth year]  MR#: [id number]  QMV#:[id number]  Patient Care Team: Nolene Ebbs, MD as PCP - General (Internal Medicine) Fanny Skates, MD as Consulting Physician (General Surgery) Chauncey Cruel, MD as Consulting Physician (Oncology) Minus Breeding, MD as Consulting Physician (Cardiology) PCP: Philis Fendt, MD Chauncey Cruel, MD GYN: SU:  OTHER MD:  CHIEF COMPLAINT: Locally advanced triple negative breast cancer  CURRENT TREATMENT: to start capecitabine   BREAST CANCER HISTORY: The patient presented to the emergency room 11/20/2015 with a complaint of a mass in the lower aspect of her right breast, which was painful and bleeding. The patient stated the mass had been present approximately a month. An ultrasound of the breast was obtained which found a 4.4 cm mass in the right breast at the 6:00 position. This was read as most consistent with an abscess. The patient was started on clindamycin and referred to the [facility], where on 01/05/2016 she underwent bilateral diagnostic mammography with tomography and right  ultrasonography.   Mammography showed far posteriorly in the 5:00 region of the right breast a 4.1 cm mass with enlarged axillary adenopathy. On exam there was a fungating ulcerated bleeding mass in the right breast at the 5:00 position 7 cm from the nipple. The right axilla was "full" on palpation. Ultrasound showed a 5.4 cm mass at the 5:00 position of the right breast 7 cm from the nipple, with at least 3 enlarged abnormal axillary lymph nodes, the largest measuring 3.7 cm.  Biopsy of the right breast mass and one axillary lymph node on 01/06/2016 showed ([id number]) both to be positive for an invasive ductal carcinoma, grade 3, triple negative, with the HER-2 signals ratio between 1.21-1.40 and the number per cell 2.85-3.00. The MIB-1-1 was 70%.  The patient's  subsequent history is as detailed below  INTERVAL HISTORY: Jennifer Garrett was evaluated in the breast clinic 01/22/2016 accompanied by her daughter Jennifer Garrett. Her case was also presented in the multidisciplinary breast cancer conference 01/14/2016. At that time a preliminary plan was proposed: Staging studies to include a PET scan, and neoadjuvant treatment since at present primary closure could not be guaranteed if the patient started with surgery.  REVIEW OF SYSTEMS: Aside from the mass itself, which is painful, believes, and causes an unpleasant odor, there were no symptoms suggestive of metastatic disease leading to the original mammogram. The patient denies unusual headaches, visual changes, nausea, vomiting, stiff neck, dizziness, or gait imbalance. There has been no cough, phlegm production, or pleurisy, no chest pain or pressure, and no change in bowel or bladder habits. The patient denies fever, rash, bleeding, unexplained fatigue or unexplained weight loss. A detailed review of systems was otherwise entirely negative.        PAST MEDICAL HISTORY: Past Medical History  Diagnosis Date  . Shortness of breath   . Coronary artery disease   . Hypertension   . Diabetes mellitus   . Arthritis   . Carpal tunnel syndrome   . Hyperlipemia   . Asthma     rarely uses neb  . Breast cancer of lower-inner quadrant of right female breast ([place]) 01/22/2016    PAST SURGICAL HISTORY: Past Surgical History  Procedure Laterality Date  . Abdominal hysterectomy    . Cardiac catheterization  2003    stent-  . Shoulder arthroscopy      right and left  . Carpal tunnel release  03/29/2012    Procedure: CARPAL TUNNEL RELEASE;  Surgeon: Wynonia Sours, MD;  Location: [facility];  Service: Orthopedics;  Laterality: Left;  . Trigger finger release  03/29/2012    Procedure: RELEASE TRIGGER FINGER/A-1 PULLEY;  Surgeon: Wynonia Sours, MD;  Location: [facility];  Service: Orthopedics;   Laterality: Left;    FAMILY HISTORY Family History  Problem Relation Age of Onset  . Heart disease Mother   The patient's father died from "old age" at age 80. The patient's mother died at age 80 from complications of high blood pressure and diabetes. The patient had one brother, 4 sisters. There is no cancer in the family to her knowledge   GYNECOLOGIC HISTORY:  No LMP recorded. Patient has had a hysterectomy.  menarche age 82, first live birth age 60. The patient is GX P2. She underwent abdominal hysterectomy with bilateral salpingo-oophorectomy in her 80s. She did not undergo hormone replacement  SOCIAL HISTORY:  Fe used to work for [employer]. She lives with her daughter Jennifer Garrett, who is Medical sales representative for [employer]. The patient's son Jennifer Garrett lives in [place]. The patient has 1 grandchild, no great-grandchildren. She attends a local [employer] DIRECTIVES:  not in place. At the 01/22/2016 visit the patient was given the appropriate documents to complete. She tells me she intends to name her daughter Jennifer Garrett as her healthcare part of attorney    HEALTH MAINTENANCE: Social History  Substance Use Topics  . Smoking status: Former Smoker    Quit date: 03/24/1978  . Smokeless tobacco: Not on file  . Alcohol Use: No     Colonoscopy: Never   PAP: status post hysterectomy   Bone density: Never    Allergies  Allergen Reactions  . Lisinopril Other (See Comments)    States that it makes her "sensitive to light"  . Lyrica [Pregabalin] Swelling  . Feldene [Piroxicam] Rash  . Orudis [Ketoprofen] Rash  . Vibramycin [Doxycycline Calcium] Rash and Hives    Current Outpatient Prescriptions  Medication Sig Dispense Refill  . albuterol (PROVENTIL HFA;VENTOLIN HFA) 108 (90 BASE) MCG/ACT inhaler Inhale 1 puff into the lungs every 6 (six) hours as needed for wheezing or shortness of breath.    Marland Kitchen aspirin EC 81 MG tablet Take 1 tablet by  mouth every morning.    . calcium carbonate (OS-CAL) 600 MG TABS Take 600 mg by mouth 2 (two) times daily with a meal.    . diclofenac sodium (VOLTAREN) 1 % GEL Apply 1 application topically every other day.    Marland Kitchen glipiZIDE (GLUCOTROL) 10 MG tablet Take 10 mg by mouth 2 (two) times daily before a meal.    . hydrochlorothiazide (HYDRODIURIL) 25 MG tablet Take 25 mg by mouth daily.    Marland Kitchen KLOR-CON M20 20 MEQ tablet Take 20 mEq by mouth daily.    Marland Kitchen olmesartan (BENICAR) 40 MG tablet Take 40 mg by mouth daily.    . simvastatin (ZOCOR) 20 MG tablet Take 20 mg by mouth every evening.    . sitaGLIPtin (JANUVIA) 100 MG tablet Take 100 mg by mouth daily.    . traMADol (ULTRAM) 50 MG tablet Take 1 tablet (50 mg total) by mouth every 6 (six) hours as needed. 15 tablet 0  . capecitabine (XELODA) 500 MG tablet Take 3 tablets (1,500 mg total) by mouth 2 (two) times daily after a meal. 42 tablet 12  . cephALEXin (KEFLEX) 500  MG capsule Take 1 capsule (500 mg total) by mouth 2 (two) times daily. 60 capsule 3   No current facility-administered medications for this visit.    OBJECTIVE: Older African-American woman who appears stated age 82 Vitals:   01/22/16 1618  BP: 142/62  Pulse: 65  Temp: 98.6 F (37 C)  Resp: 18     Body mass index is 30.35 kg/(m^2).    ECOG FS:1 - Symptomatic but completely ambulatory  Ocular: Sclerae unicteric, EOMs intact Ear-nose-throat: Oropharynx clear and moist Lymphatic: No cervical or supraclavicular adenopathy Lungs no rales or rhonchi, no wheezes noted Heart regular rate and rhythm, no murmur appreciated Abd soft, nontender, positive bowel sounds, no masses palpated MSK no focal spinal tenderness, no joint edema Neuro: non-focal, A&O x3, appropriate affect Breasts: The left breast is unremarkable. The right breast is imaged below. Note in addition to the fungating mass in the lower inner quadrants of the breast, at the inframammary fold, a satellite lesion on the skin  of the breast more superiorly, on the medial side. There is palpable right axillary adenopathy. The left breast is unremarkable. 01/22/2016    LAB RESULTS:  CMP     Component Value Date/Time   NA 141 01/22/2016 1604   NA 142 11/20/2015 2210   K 3.9 01/22/2016 1604   K 4.3 11/20/2015 2210   CL 106 11/20/2015 2210   CO2 25 01/22/2016 1604   CO2 27 11/20/2015 2210   GLUCOSE 150* 01/22/2016 1604   GLUCOSE 113* 11/20/2015 2210   BUN 19.2 01/22/2016 1604   BUN 12 11/20/2015 2210   CREATININE 1.1 01/22/2016 1604   CREATININE 0.95 11/20/2015 2210   CALCIUM 9.9 01/22/2016 1604   CALCIUM 9.5 11/20/2015 2210   PROT 8.1 01/22/2016 1604   PROT 7.6 11/28/2013 0037   ALBUMIN 3.6 01/22/2016 1604   ALBUMIN 3.6 11/28/2013 0037   AST 20 01/22/2016 1604   AST 19 11/28/2013 0037   ALT 10 01/22/2016 1604   ALT 11 11/28/2013 0037   ALKPHOS 65 01/22/2016 1604   ALKPHOS 56 11/28/2013 0037   BILITOT 0.45 01/22/2016 1604   BILITOT 0.2* 11/28/2013 0037   GFRNONAA 55* 11/20/2015 2210   GFRAA >60 11/20/2015 2210    INo results found for: SPEP, UPEP  Lab Results  Component Value Date   WBC 7.5 01/22/2016   NEUTROABS 3.1 01/22/2016   HGB 12.1 01/22/2016   HCT 36.6 01/22/2016   MCV 84.1 01/22/2016   PLT 300 01/22/2016      Chemistry      Component Value Date/Time   NA 141 01/22/2016 1604   NA 142 11/20/2015 2210   K 3.9 01/22/2016 1604   K 4.3 11/20/2015 2210   CL 106 11/20/2015 2210   CO2 25 01/22/2016 1604   CO2 27 11/20/2015 2210   BUN 19.2 01/22/2016 1604   BUN 12 11/20/2015 2210   CREATININE 1.1 01/22/2016 1604   CREATININE 0.95 11/20/2015 2210      Component Value Date/Time   CALCIUM 9.9 01/22/2016 1604   CALCIUM 9.5 11/20/2015 2210   ALKPHOS 65 01/22/2016 1604   ALKPHOS 56 11/28/2013 0037   AST 20 01/22/2016 1604   AST 19 11/28/2013 0037   ALT 10 01/22/2016 1604   ALT 11 11/28/2013 0037   BILITOT 0.45 01/22/2016 1604   BILITOT 0.2* 11/28/2013 0037       No  results found for: LABCA2  No components found for: LABCA125  No results for input(s): INR in  the last 168 hours.  Urinalysis    Component Value Date/Time   COLORURINE YELLOW 11/28/2013 0143   APPEARANCEUR CLEAR 11/28/2013 0143   LABSPEC 1.020 11/28/2013 0143   PHURINE 6.0 11/28/2013 0143   GLUCOSEU NEGATIVE 11/28/2013 0143   HGBUR NEGATIVE 11/28/2013 0143   BILIRUBINUR NEGATIVE 11/28/2013 0143   KETONESUR NEGATIVE 11/28/2013 0143   PROTEINUR NEGATIVE 11/28/2013 0143   UROBILINOGEN 1.0 11/28/2013 0143   NITRITE NEGATIVE 11/28/2013 0143   LEUKOCYTESUR NEGATIVE 11/28/2013 0143     STUDIES: Mm Digital Diagnostic Unilat R  01/06/2016  CLINICAL DATA:  Clip imaging following ultrasound-guided core needle biopsy of a right breast mass and right axillary lymph node. EXAM: DIAGNOSTIC RIGHT MAMMOGRAM POST ULTRASOUND BIOPSY COMPARISON:  Previous exam(s). FINDINGS: Mammographic images were obtained following ultrasound guided biopsy of a large lower outer quadrant right breast mass as well as an enlarged right axillary lymph node. The images show the ribbon shaped biopsy clip within the large lower outer quadrant mass. The HydroMARK clip was visualized sonographically within the axillary mass/enlarged lymph nodes. IMPRESSION: Well-positioned ribbon shaped biopsy clip following ultrasound-guided core needle biopsy of a large lower outer quadrant right breast mass. Final Assessment: Post Procedure Mammograms for Marker Placement Electronically Signed   By: Lajean Manes M.D.   On: 01/06/2016 13:53   US Breast Ltd Uni Right Inc Axilla  01/05/2016  CLINICAL DATA:  82 year old female with a palpable, ulcerated bleeding mass in the 5 o'clock region of the right breast. Patient had been treated with a course of antibiotics for possible infection without resolution. EXAM: 2D DIGITAL DIAGNOSTIC BILATERAL MAMMOGRAM WITH CAD AND ADJUNCT TOMO ULTRASOUND RIGHT BREAST COMPARISON:  Prior exam dated 04/25/2008. ACR  Breast Density Category b: There are scattered areas of fibroglandular density. FINDINGS: There is diffuse skin thickening as well as trabecular thickening of the right breast. Far posteriorly in the 5 o'clock region of the right breast is a 4.1 cm mass. The posterior aspect of the mass is not visualized on the imaging. There are associated fine pleomorphic calcifications. There is enlarged axillary adenopathy associated with malignant type microcalcifications. The largest nodal mass measures 3.4 cm. Mammographic images were processed with CAD. On physical exam, there is a fungating, ulcerated bleeding mass in the right breast at 5 o'clock 7 cm from the nipple. Fullness is felt in the right axilla. Targeted ultrasound is performed, showing an irregular hypoechoic mass in the right breast at 5 o'clock 7 cm from the nipple measuring 5.4 x 3.9 x 4.1 cm. Sonographic evaluation of the right axilla shows 3 nodal masses. The largest nodal mass measures 3.6 x 1.8 x 3.7 cm. IMPRESSION: Imaging findings are concerning for invasive mammary carcinoma with axillary metastasis. RECOMMENDATION: Ultrasound-guided core biopsies of the right breast mass is well is axillary adenopathy is recommended. This has been scheduled on 01/06/2016. I have discussed the findings and recommendations with the patient. Results were also provided in writing at the conclusion of the visit. If applicable, a reminder letter will be sent to the patient regarding the next appointment. BI-RADS CATEGORY  5: Highly suggestive of malignancy. Electronically Signed   By: Lillia Mountain M.D.   On: 01/05/2016 16:30   Mm Diag Breast Tomo Bilateral  01/05/2016  CLINICAL DATA:  82 year old female with a palpable, ulcerated bleeding mass in the 5 o'clock region of the right breast. Patient had been treated with a course of antibiotics for possible infection without resolution. EXAM: 2D DIGITAL DIAGNOSTIC BILATERAL MAMMOGRAM WITH CAD AND ADJUNCT  TOMO ULTRASOUND RIGHT  BREAST COMPARISON:  Prior exam dated 04/25/2008. ACR Breast Density Category b: There are scattered areas of fibroglandular density. FINDINGS: There is diffuse skin thickening as well as trabecular thickening of the right breast. Far posteriorly in the 5 o'clock region of the right breast is a 4.1 cm mass. The posterior aspect of the mass is not visualized on the imaging. There are associated fine pleomorphic calcifications. There is enlarged axillary adenopathy associated with malignant type microcalcifications. The largest nodal mass measures 3.4 cm. Mammographic images were processed with CAD. On physical exam, there is a fungating, ulcerated bleeding mass in the right breast at 5 o'clock 7 cm from the nipple. Fullness is felt in the right axilla. Targeted ultrasound is performed, showing an irregular hypoechoic mass in the right breast at 5 o'clock 7 cm from the nipple measuring 5.4 x 3.9 x 4.1 cm. Sonographic evaluation of the right axilla shows 3 nodal masses. The largest nodal mass measures 3.6 x 1.8 x 3.7 cm. IMPRESSION: Imaging findings are concerning for invasive mammary carcinoma with axillary metastasis. RECOMMENDATION: Ultrasound-guided core biopsies of the right breast mass is well is axillary adenopathy is recommended. This has been scheduled on 01/06/2016. I have discussed the findings and recommendations with the patient. Results were also provided in writing at the conclusion of the visit. If applicable, a reminder letter will be sent to the patient regarding the next appointment. BI-RADS CATEGORY  5: Highly suggestive of malignancy. Electronically Signed   By: Lillia Mountain M.D.   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 The right axillary lymph node is positive for ductal carcinoma and may  represent a node completely replaced by tumor. This was found to be concordant by Dr. Lajean Manes. Pathology results were discussed with the patient's daughter, Jennifer Garrett, by telephone. The patient did well after the biopsy. Post biopsy instructions and care were reviewed and questions were answered. The patient was encouraged to call [facility] for any additional concerns. Surgical consultation has been arranged with Dr. Fanny Skates at [facility] on January 12, 2016. Pathology results reported by Susa Raring RN, BSN on 01/08/2016. Electronically Signed   By: Lajean Manes M.D.   On: 01/08/2016 07:49  01/08/2016  CLINICAL DATA:  Patient presents for ultrasound-guided biopsy of an ulcerated large lower outer quadrant right breast mass and enlarged right axillary lymph nodes. EXAM: ULTRASOUND GUIDED RIGHT BREAST AND RIGHT AXILLA CORE NEEDLE BIOPSY COMPARISON:  Previous exam(s). FINDINGS: I met with the patient and we discussed the procedure of ultrasound-guided biopsy, including benefits and alternatives. We discussed the high likelihood of a successful procedure. We discussed the risks of the procedure, including infection, bleeding, tissue injury, clip migration, and inadequate sampling. Informed written consent was given. The usual time-out protocol was performed immediately prior to the procedure. Using sterile technique and 1% Lidocaine as local anesthetic, under direct ultrasound visualization, a 14 gauge spring-loaded device was used to perform biopsy of the abnormally enlarged right axillary lymph node using a lateral, inferior approach. At the conclusion of the procedure a HydroMARK tissue marker clip was deployed into the biopsy cavity. Using sterile technique and 1% Lidocaine as local anesthetic, under direct ultrasound visualization, a 12 gauge spring-loaded device was used to perform  biopsy of lower outer quadrant right breast mass using a lateral  approach. At the conclusion of the procedure a ribbon shaped tissue marker clip was deployed into the biopsy cavity. Follow up 2 view mammogram was performed and dictated separately. IMPRESSION: Ultrasound guided biopsy of a right lower outer quadrant breast mass and an abnormally enlarged right axillary lymph node. No apparent complications. Electronically Signed: By: Lajean Manes M.D. 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 The right axillary lymph node is positive for ductal carcinoma and may represent a node completely replaced by tumor. This was found to be concordant by Dr. Lajean Manes. Pathology results were discussed with the patient's daughter, Jennifer Garrett, by telephone. The patient did well after the biopsy. Post biopsy instructions and care were reviewed and questions were answered. The patient was encouraged to call [facility] for any additional concerns. Surgical consultation has been arranged with Dr. Fanny Skates at [facility] on January 12, 2016. Pathology results reported by Susa Raring RN, BSN on 01/08/2016. Electronically Signed   By: Lajean Manes M.D.   On: 01/08/2016 07:49  01/08/2016  CLINICAL DATA:  Patient presents for ultrasound-guided biopsy of an ulcerated large lower outer quadrant right breast mass and enlarged right axillary lymph nodes. EXAM: ULTRASOUND GUIDED RIGHT BREAST AND RIGHT AXILLA CORE NEEDLE BIOPSY COMPARISON:  Previous exam(s). FINDINGS: I met with the patient and we discussed the procedure of ultrasound-guided biopsy, including benefits and alternatives. We discussed the high likelihood of a successful procedure. We discussed the risks of the procedure, including infection, bleeding, tissue injury, clip migration,  and inadequate sampling. Informed written consent was given. The usual time-out protocol was performed immediately prior to the procedure. Using sterile technique and 1% Lidocaine as local anesthetic, under direct ultrasound visualization, a 14 gauge spring-loaded device was used to perform biopsy of the abnormally enlarged right axillary lymph node using a lateral, inferior approach. At the conclusion of the procedure a HydroMARK tissue marker clip was deployed into the biopsy cavity. Using sterile technique and 1% Lidocaine as local anesthetic, under direct ultrasound visualization, a 12 gauge spring-loaded device was used to perform biopsy of lower outer quadrant right breast mass using a lateral approach. At the conclusion of the procedure a ribbon shaped tissue marker clip was deployed into the biopsy cavity. Follow up 2 view mammogram was performed and dictated separately. IMPRESSION: Ultrasound guided biopsy of a right lower outer quadrant breast mass and an abnormally enlarged right axillary lymph node. No apparent complications. Electronically Signed: By: Lajean Manes M.D. On: 01/06/2016 13:50    ELIGIBLE FOR AVAILABLE RESEARCH PROTOCOL: no   ASSESSMENT: 82 y.o. [place] woman status post right breast lower outer quadrant and right axillary lymph node biopsy 01/06/2016 both positive for a clinical T4 N1, stage IIIB invasive ductal carcinoma, grade 3, triple negative, with an MIB-1 of 70%.  (1) neoadjuvant chemotherapy will consist of capecitabine, 1500 mg twice daily, 7 days on 7 days off, starting 02/04/2016  (2) right modified radical mastectomy to follow as appropriate  (3) adjuvant radiation to follow surgery  PLAN: We spent the better part of today's hour-long appointment discussing the biology of breast cancer in general, and the specifics of the patient's tumor in particular. We discussed  the difference between local and systemic therapy of breast cancer. With regards to local  treatment Keiry will eventually benefit from a right modified radical mastectomy, followed by radiation. The goal would be to control local disease and prevent uncontrolled local spread of her breast cancer which is going to cause her more pain bleeding and odor, the things that she really worries about the most.  The surgeon's opinion is that we cannot proceed directly to surgery--the patient needs some systemic therapy to make the surgery possible. We discussed the fact that the patient is not a candidate for anti-estrogens or anti-HER-2 immunotherapy, since her breast cancer was triple negative. The only choice for systemic treatment is chemotherapy.  There are many choices in chemotherapy but given the patient's age and the fact that we have not completely staged her yett, we are going to start with capecitabine at standard doses, namely 1500 mg twice daily 1 week on and one-week off, starting 02/04/2016.  Today we discussed the possible toxicities, side effects and complications of this agent. I particularly urged the patient and her daughter to call us if she develops mouth sores, severe diarrhea, fever, rash, bleeding, or any other problems. My hope is that after a few cycles of chemotherapy the patient's ulcerating tumor will crust over, and the smell and bleeding subside. At some point we will proceed to definitive surgery for local control.  We do need to stage the patient and I'm setting her up for a chest CT scan and a bone scan. I am prescribing cephalexin to see if we can control the smell that way, but I am also requesting a consult from home health wound nurse. Finally I gave the patient advanced directive documents today. She intends to name her daughter is her healthcare part of attorney which seems very reasonable. Hopefully they will get that notarize and brought back in the next visit so we can scan at into her records.  Jennifer Garrett has a good understanding of the overall plan, which was given  to her in writing. She agrees with it. She will call with any problems that may develop before her next visit here.  Chauncey Cruel, MD   01/22/2016 5:34 PM Medical Oncology and Hematology [facility] [address] [place], [place] [address] Tel. [phone]    Fax. [phone]

## 2016-01-23 ENCOUNTER — Telehealth: Payer: Self-pay | Admitting: Cardiology

## 2016-01-23 LAB — CANCER ANTIGEN 27.29: CA 27.29: 35.2 U/mL (ref 0.0–38.6)

## 2016-01-23 NOTE — Telephone Encounter (Signed)
Received records from [facility] for appointment on 02/20/16 with Dr Percival Spanish.  Records given to [facility] (medical records) for Dr Hochrein's schedule on 02/20/16. lp

## 2016-01-23 NOTE — Progress Notes (Signed)
RX for Xeloda at [facility].  Called pharmacy and informed patient has a $71.86 copay.  They are contacting patient to see if it is affordable.  Patient has Medicare with no supplement. Will f/u Monday 01/26/16 to see status.  Thank you  Henreitta Leber, PharmD [facility]

## 2016-01-26 ENCOUNTER — Other Ambulatory Visit: Payer: Self-pay | Admitting: Oncology

## 2016-01-26 ENCOUNTER — Encounter: Payer: Self-pay | Admitting: Pharmacist

## 2016-01-26 ENCOUNTER — Telehealth: Payer: Self-pay | Admitting: Pharmacist

## 2016-01-26 ENCOUNTER — Telehealth: Payer: Self-pay | Admitting: Oncology

## 2016-01-26 MED FILL — XELODA 500 MG TABLET: 500 | 7 days supply | Qty: 42 | Fill #0

## 2016-01-26 NOTE — Telephone Encounter (Signed)
lvm to inform pt of 7/19 appt at 1045 am per GM

## 2016-01-26 NOTE — Telephone Encounter (Signed)
Oral Chemotherapy Pharmacist Encounter  I spoke with patients daughter, [name] for overview of new oral chemotherapy medication: Xeloda. The Rx was filled at [place] w/ [phone] copay.  Pt will begin tx today (01/26/16)  Pts daughter had already been counseled re: administration, dosing, side effects, safe handling, and monitoring. Side effects include but not limited to: diarrhea, nausea, hand/foot syndrome.  Margaret voiced understanding and appreciation.   I advised they get Imodium in case pt develops diarrhea. I also recommended pt use moisturizer lotion/cream and take care of her hands/feet. If pt develops n/v, her daughter will call us for antiemetic.  I gave [name] our contact number for the oral chemo clinic.  We will follow up in 1 week for adherence and toxicity management.  We will call [name] (ph# [phone]).  She is aware we will be calling.  Thank you, Kennith Center, Pharm.D., CPP 01/26/2016@2 :14 PM Oral Chemotherapy [facility]

## 2016-01-26 NOTE — Progress Notes (Signed)
01-26-16: Pt presented to [facility] to pick up Xeloda prescription; advised patient to go to [facility] for pick-up -- she was notified of $72 copay previously by [facility] outpatient pharmacy.

## 2016-01-27 ENCOUNTER — Ambulatory Visit (HOSPITAL_COMMUNITY)
Admission: RE | Admit: 2016-01-27 | Discharge: 2016-01-27 | Disposition: A | Discharge status: Home / Self Care | Payer: Medicare Other | Source: Ambulatory Visit | Attending: Oncology | Admitting: Oncology

## 2016-01-27 DIAGNOSIS — R590 Localized enlarged lymph nodes: Secondary | ICD-10-CM | POA: Insufficient documentation

## 2016-01-27 DIAGNOSIS — C50311 Malignant neoplasm of lower-inner quadrant of right female breast: Secondary | ICD-10-CM

## 2016-01-27 DIAGNOSIS — I2510 Atherosclerotic heart disease of native coronary artery without angina pectoris: Secondary | ICD-10-CM | POA: Diagnosis not present

## 2016-01-27 DIAGNOSIS — I700 Atherosclerosis of aorta: Secondary | ICD-10-CM | POA: Diagnosis not present

## 2016-01-27 MED ORDER — IOPAMIDOL (ISOVUE-300) INJECTION 61%
75.0000 mL | Freq: Once | INTRAVENOUS | Status: AC | PRN
  Administered 2016-01-27: 75 mL via INTRAVENOUS

## 2016-01-27 MED ORDER — TECHNETIUM TC 99M MEDRONATE IV KIT
25.0000 | PACK | Freq: Once | INTRAVENOUS | Status: AC | PRN
  Administered 2016-01-27: 25 via INTRAVENOUS

## 2016-01-30 ENCOUNTER — Telehealth: Payer: Self-pay

## 2016-01-30 NOTE — Telephone Encounter (Signed)
Msg rcvd from [place] at [facility].  She reports the [facility] wound care specialist has assessed the wound under the patient's right breast and recommends:  Using a wound cleanser, applying a barrier cream around the wound, applying gauze wet with 0.025% Dakins solution, and covering all with a dry gauze and change daily and as needed.

## 2016-02-04 ENCOUNTER — Encounter: Payer: Self-pay | Admitting: Oncology

## 2016-02-04 ENCOUNTER — Other Ambulatory Visit (HOSPITAL_BASED_OUTPATIENT_CLINIC_OR_DEPARTMENT_OTHER): Payer: Medicare Other

## 2016-02-04 ENCOUNTER — Ambulatory Visit (HOSPITAL_BASED_OUTPATIENT_CLINIC_OR_DEPARTMENT_OTHER): Payer: Medicare Other | Admitting: Oncology

## 2016-02-04 ENCOUNTER — Other Ambulatory Visit: Payer: Self-pay

## 2016-02-04 VITALS — BP 132/54 | HR 72 | Temp 97.9°F | Resp 18 | Ht 62.0 in | Wt 165.7 lb

## 2016-02-04 DIAGNOSIS — C773 Secondary and unspecified malignant neoplasm of axilla and upper limb lymph nodes: Secondary | ICD-10-CM

## 2016-02-04 DIAGNOSIS — Z171 Estrogen receptor negative status [ER-]: Secondary | ICD-10-CM

## 2016-02-04 DIAGNOSIS — C50311 Malignant neoplasm of lower-inner quadrant of right female breast: Secondary | ICD-10-CM | POA: Diagnosis present

## 2016-02-04 LAB — CBC WITH DIFFERENTIAL/PLATELET
BASO%: 0.7 % (ref 0.0–2.0)
BASOS ABS: 0.1 10*3/uL (ref 0.0–0.1)
EOS ABS: 0.2 10*3/uL (ref 0.0–0.5)
EOS%: 2.7 % (ref 0.0–7.0)
HCT: 35.3 % (ref 34.8–46.6)
HEMOGLOBIN: 11.6 g/dL (ref 11.6–15.9)
LYMPH#: 2.6 10*3/uL (ref 0.9–3.3)
LYMPH%: 38.9 % (ref 14.0–49.7)
MCH: 27.8 pg (ref 25.1–34.0)
MCHC: 32.9 g/dL (ref 31.5–36.0)
MCV: 84.4 fL (ref 79.5–101.0)
MONO#: 0.8 10*3/uL (ref 0.1–0.9)
MONO%: 11.2 % (ref 0.0–14.0)
NEUT#: 3.1 10*3/uL (ref 1.5–6.5)
NEUT%: 46.5 % (ref 38.4–76.8)
PLATELETS: 248 10*3/uL (ref 145–400)
RBC: 4.18 10*6/uL (ref 3.70–5.45)
RDW: 12.7 % (ref 11.2–14.5)
WBC: 6.7 10*3/uL (ref 3.9–10.3)
nRBC: 0 % (ref 0–0)

## 2016-02-04 MED ORDER — CAPECITABINE 500 MG PO TABS: 1500.0000 mg | ORAL_TABLET | Freq: Two times a day (BID) | ORAL | Status: DC

## 2016-02-04 NOTE — Progress Notes (Signed)
[place]  [phone] [phone]     ID: Jennifer Garrett DOB: 1932-05-21  MR#: [id number]  TUU#:[id number]  Patient Care Team: Jennifer Ebbs, MD as PCP - General (Internal Medicine) Jennifer Skates, MD as Consulting Physician (General Surgery) Jennifer Cruel, MD as Consulting Physician (Oncology) Jennifer Breeding, MD as Consulting Physician (Cardiology) PCP: Jennifer Fendt, MD Jennifer Bussing, NP GYN: SU:  OTHER MD:  CHIEF COMPLAINT: Locally advanced triple negative breast cancer  CURRENT TREATMENT:capecitabine 1500 mg BID 7 days on then 7 days off. Started 01/27/2016.   BREAST CANCER HISTORY: The patient presented to the emergency room 11/20/2015 with a complaint of a mass in the lower aspect of her right breast, which was painful and bleeding. The patient stated the mass had been present approximately a month. An ultrasound of the breast was obtained which found a 4.4 cm mass in the right breast at the 6:00 position. This was read as most consistent with an abscess. The patient was started on clindamycin and referred to the [place], where on 01/05/2016 she underwent bilateral diagnostic mammography with tomography and right  ultrasonography.   Mammography showed far posteriorly in the 5:00 region of the right breast a 4.1 cm mass with enlarged axillary adenopathy. On exam there was a fungating ulcerated bleeding mass in the right breast at the 5:00 position 7 cm from the nipple. The right axilla was "full" on palpation. Ultrasound showed a 5.4 cm mass at the 5:00 position of the right breast 7 cm from the nipple, with at least 3 enlarged abnormal axillary lymph nodes, the largest measuring 3.7 cm.  Biopsy of the right breast mass and one axillary lymph node on 01/06/2016 showed ([id number]) both to be positive for an invasive ductal carcinoma, grade 3, triple negative, with the HER-2 signals ratio between 1.21-1.40 and the number per cell  2.85-3.00. The MIB-1-1 was 70%.  The patient's subsequent history is as detailed below  INTERVAL HISTORY: Jennifer Garrett started neoadjuvant treatment with capecitabine 1500 mg BID on 01/27/2016 since at present primary closure could not be guaranteed if the patient started with surgery. She is being seen by nursing with advanced home care for dressing changes to her right breast. Indicates that the drainage has improved and the odor has improved significantly since she started doing the dressing changes recommended by home care. She also had recent staging studies including a CT of the chest and bone scan. She is here to assess for toxicity to the chemotherapy and review scan results.   REVIEW OF SYSTEMS: Jennifer Garrett is doing well today. She started the capecitabine on 01/27/2016 and tolerated it well. The patient denies unusual headaches, visual changes, nausea, vomiting, stiff neck, dizziness, or gait imbalance. There has been no cough, phlegm production, or pleurisy, no chest pain or pressure, and no change in bowel or bladder habits. The patient denies fever, rash, bleeding, unexplained fatigue or unexplained weight loss. A detailed review of systems was otherwise entirely negative.        PAST MEDICAL HISTORY: Past Medical History  Diagnosis Date  . Shortness of breath   . Coronary artery disease   . Hypertension   . Diabetes mellitus   . Arthritis   . Carpal tunnel syndrome   . Hyperlipemia   . Asthma     rarely uses neb  . Breast cancer of lower-inner quadrant of right female breast ([facility]) 01/22/2016    PAST SURGICAL HISTORY: Past Surgical History  Procedure Laterality  Date  . Abdominal hysterectomy    . Cardiac catheterization  2003    stent-  . Shoulder arthroscopy      right and left  . Carpal tunnel release  03/29/2012    Procedure: CARPAL TUNNEL RELEASE;  Surgeon: Jennifer Sours, MD;  Location: [facility];  Service: Orthopedics;  Laterality: Left;  . Trigger finger release   03/29/2012    Procedure: RELEASE TRIGGER FINGER/A-1 PULLEY;  Surgeon: Jennifer Sours, MD;  Location: [facility] [facility];  Service: Orthopedics;  Laterality: Left;    FAMILY HISTORY Family History  Problem Relation Age of Onset  . Heart disease Mother   The patient's father died from "old age" at age 14. The patient's mother died at age 61 from complications of high blood pressure and diabetes. The patient had one brother, 4 sisters. There is no cancer in the family to her knowledge   GYNECOLOGIC HISTORY:  No LMP recorded. Patient has had a hysterectomy.  menarche age 63, first live birth age 58. The patient is GX P2. She underwent abdominal hysterectomy with bilateral salpingo-oophorectomy in her 27s. She did not undergo hormone replacement  SOCIAL HISTORY:  Jennifer Garrett used to work for [employer]. She lives with her daughter Jennifer Garrett, who is Medical sales representative for [employer]. The patient's son Jennifer Garrett lives in [place]. The patient has 1 grandchild, no great-grandchildren. She attends a local [place] DIRECTIVES:  not in place. At the 01/22/2016 visit the patient was given the appropriate documents to complete. She tells me she intends to name her daughter Jennifer Garrett as her healthcare part of attorney    HEALTH MAINTENANCE: Social History  Substance Use Topics  . Smoking status: Former Smoker    Quit date: 03/24/1978  . Smokeless tobacco: None  . Alcohol Use: No     Colonoscopy: Never   PAP: status post hysterectomy   Bone density: Never    Allergies  Allergen Reactions  . Lisinopril Other (See Comments)    States that it makes her "sensitive to light"  . Lyrica [Pregabalin] Swelling  . Feldene [Piroxicam] Rash  . Orudis [Ketoprofen] Rash  . Vibramycin [Doxycycline Calcium] Rash and Hives    Current Outpatient Prescriptions  Medication Sig Dispense Refill  . aspirin EC 81 MG tablet Take 1 tablet by mouth every morning.    .  calcium carbonate (OS-CAL) 600 MG TABS Take 600 mg by mouth 2 (two) times daily with a meal.    . capecitabine (XELODA) 500 MG tablet Take 3 tablets (1,500 mg total) by mouth 2 (two) times daily after a meal. Due to start on 02/10/16 42 tablet 0  . diclofenac sodium (VOLTAREN) 1 % GEL Apply 1 application topically every other day.    Marland Kitchen glipiZIDE (GLUCOTROL) 10 MG tablet Take 10 mg by mouth 2 (two) times daily before a meal.    . hydrochlorothiazide (HYDRODIURIL) 25 MG tablet Take 25 mg by mouth daily.    Marland Kitchen KLOR-CON M20 20 MEQ tablet Take 20 mEq by mouth daily.    Marland Kitchen olmesartan (BENICAR) 40 MG tablet Take 40 mg by mouth daily.    . simvastatin (ZOCOR) 20 MG tablet Take 20 mg by mouth every evening.    . sitaGLIPtin (JANUVIA) 100 MG tablet Take 100 mg by mouth daily.    Marland Kitchen albuterol (PROVENTIL HFA;VENTOLIN HFA) 108 (90 BASE) MCG/ACT inhaler Inhale 1 puff into the lungs every 6 (six) hours as  needed for wheezing or shortness of breath. Reported on 02/04/2016    . traMADol (ULTRAM) 50 MG tablet Take 1 tablet (50 mg total) by mouth every 6 (six) hours as needed. (Patient not taking: Reported on 02/04/2016) 15 tablet 0   No current facility-administered medications for this visit.    OBJECTIVE: Older African-American woman who appears stated age 50 Vitals:   02/04/16 1111  BP: 132/54  Pulse: 72  Temp: 97.9 F (36.6 C)  Resp: 18     Body mass index is 30.3 kg/(m^2).    ECOG FS:1 - Symptomatic but completely ambulatory  Ocular: Sclerae unicteric, EOMs intact Ear-nose-throat: Oropharynx clear and moist Lymphatic: No cervical or supraclavicular adenopathy Lungs no rales or rhonchi, no wheezes noted Heart regular rate and rhythm, no murmur appreciated Abd soft, nontender, positive bowel sounds, no masses palpated MSK no focal spinal tenderness, no joint edema Neuro: non-focal, A&O x3, appropriate affect Breasts: The left breast is unremarkable. The right breast is imaged below. Note in addition  to the fungating mass in the lower inner quadrants of the breast, at the inframammary fold, a satellite lesion on the skin of the breast more superiorly, on the medial side. There is palpable right axillary adenopathy. The left breast is unremarkable. 01/22/2016    LAB RESULTS:  CMP     Component Value Date/Time   NA 141 01/22/2016 1604   NA 142 11/20/2015 2210   K 3.9 01/22/2016 1604   K 4.3 11/20/2015 2210   CL 106 11/20/2015 2210   CO2 25 01/22/2016 1604   CO2 27 11/20/2015 2210   GLUCOSE 150* 01/22/2016 1604   GLUCOSE 113* 11/20/2015 2210   BUN 19.2 01/22/2016 1604   BUN 12 11/20/2015 2210   CREATININE 1.1 01/22/2016 1604   CREATININE 0.95 11/20/2015 2210   CALCIUM 9.9 01/22/2016 1604   CALCIUM 9.5 11/20/2015 2210   PROT 8.1 01/22/2016 1604   PROT 7.6 11/28/2013 0037   ALBUMIN 3.6 01/22/2016 1604   ALBUMIN 3.6 11/28/2013 0037   AST 20 01/22/2016 1604   AST 19 11/28/2013 0037   ALT 10 01/22/2016 1604   ALT 11 11/28/2013 0037   ALKPHOS 65 01/22/2016 1604   ALKPHOS 56 11/28/2013 0037   BILITOT 0.45 01/22/2016 1604   BILITOT 0.2* 11/28/2013 0037   GFRNONAA 55* 11/20/2015 2210   GFRAA >60 11/20/2015 2210    INo results found for: SPEP, UPEP  Lab Results  Component Value Date   WBC 6.7 02/04/2016   NEUTROABS 3.1 02/04/2016   HGB 11.6 02/04/2016   HCT 35.3 02/04/2016   MCV 84.4 02/04/2016   PLT 248 02/04/2016      Chemistry      Component Value Date/Time   NA 141 01/22/2016 1604   NA 142 11/20/2015 2210   K 3.9 01/22/2016 1604   K 4.3 11/20/2015 2210   CL 106 11/20/2015 2210   CO2 25 01/22/2016 1604   CO2 27 11/20/2015 2210   BUN 19.2 01/22/2016 1604   BUN 12 11/20/2015 2210   CREATININE 1.1 01/22/2016 1604   CREATININE 0.95 11/20/2015 2210      Component Value Date/Time   CALCIUM 9.9 01/22/2016 1604   CALCIUM 9.5 11/20/2015 2210   ALKPHOS 65 01/22/2016 1604   ALKPHOS 56 11/28/2013 0037   AST 20 01/22/2016 1604   AST 19 11/28/2013 0037   ALT 10  01/22/2016 1604   ALT 11 11/28/2013 0037   BILITOT 0.45 01/22/2016 1604   BILITOT 0.2* 11/28/2013  0037       No results found for: LABCA2  No components found for: LABCA125  No results for input(s): INR in the last 168 hours.  Urinalysis    Component Value Date/Time   COLORURINE YELLOW 11/28/2013 0143   APPEARANCEUR CLEAR 11/28/2013 0143   LABSPEC 1.020 11/28/2013 0143   PHURINE 6.0 11/28/2013 0143   GLUCOSEU NEGATIVE 11/28/2013 0143   HGBUR NEGATIVE 11/28/2013 0143   BILIRUBINUR NEGATIVE 11/28/2013 0143   KETONESUR NEGATIVE 11/28/2013 0143   PROTEINUR NEGATIVE 11/28/2013 0143   UROBILINOGEN 1.0 11/28/2013 0143   NITRITE NEGATIVE 11/28/2013 0143   LEUKOCYTESUR NEGATIVE 11/28/2013 0143     STUDIES: Ct Chest W Contrast  01/27/2016  CLINICAL DATA:  New diagnosis of breast cancer EXAM: CT CHEST WITH CONTRAST TECHNIQUE: Multidetector CT imaging of the chest was performed during intravenous contrast administration. CONTRAST:  29m ISOVUE-300 IOPAMIDOL (ISOVUE-300) INJECTION 61% COMPARISON:  06/17/2014 FINDINGS: Mediastinum/Lymph Nodes: The heart size appears normal. There is no pericardial effusion identified. Aortic atherosclerosis noted. Calcification in the LAD and left circumflex Coronary artery is identified. No enlarged mediastinal or hilar lymph nodes. No internal mammary adenopathy identified. Multi nodular thyroid gland is identified with substernal extension. Lungs/Pleura: There is no pleural fluid. Mild changes of emphysema. 4 mm lung nodule is identified within the right upper lobe, image 41 of series 5. Nonspecific. Not confidently present on previous examination. Subpleural scar like density along the minor fissure is unchanged, image 80 of series 5. Upper abdomen: There is no focal liver abnormality. The adrenal glands are unremarkable. The visualized portions of the spleen and kidneys are unremarkable. Musculoskeletal: Diffuse skin thickening overlying the right breast is  identified. Right breast mass containing clip is identified measuring 4.4 cm, image 95 of series 2. Enlarged right axillary lymph nodes are identified. Index lymph node measures 2.1 cm, image 24 of series 2. There is a right supraclavicular lymph node which is borderline enlarged measuring 9 mm, image 6 of series 2. No left-sided supraclavicular or axillary adenopathy. IMPRESSION: 1. Right breast mass compatible with newly diagnosed breast carcinoma. 2. Enlarged right axillary lymph nodes and borderline right supraclavicular lymph node. 3. No evidence for mediastinal or hilar adenopathy or pulmonary metastasis. 4. Small nodule in the right upper lobe measures 4 mm. Attention on follow-up imaging advise. 5. Aortic atherosclerosis and multi vessel Coronary artery calcification. Electronically Signed   By: TKerby MoorsM.D.   On: 01/27/2016 15:35   Nm Bone Scan Whole Body  01/27/2016  CLINICAL DATA:  Newly diagnosed breast carcinoma EXAM: NUCLEAR MEDICINE WHOLE BODY BONE SCAN TECHNIQUE: Whole body anterior and posterior images were obtained approximately 3 hours after intravenous injection of radiopharmaceutical. RADIOPHARMACEUTICALS:  Twenty-six mCi Technetium-956mDP IV COMPARISON:  01/27/2016 FINDINGS: There is adequate uptake of radioactive tracer throughout the bony skeleton. Excessive soft tissue uptake is noted in the right breast consistent with the patient's underlying history of breast carcinoma. Degenerative changes are noted within the thoracolumbar spine as well as in the sacroiliac joints. No focal area of increased activity to suggest metastatic disease is noted. Degenerative change of the right knee joint is noted as well. IMPRESSION: No definitive metastatic disease is identified. Excessive soft tissue uptake in the right breast is noted consistent with the given clinical history. Degenerative changes as described. Electronically Signed   By: MaInez Catalina.D.   On: 01/27/2016 15:40   Mm Digital  Diagnostic Unilat R  01/06/2016  CLINICAL DATA:  Clip imaging following ultrasound-guided core  needle biopsy of a right breast mass and right axillary lymph node. EXAM: DIAGNOSTIC RIGHT MAMMOGRAM POST ULTRASOUND BIOPSY COMPARISON:  Previous exam(s). FINDINGS: Mammographic images were obtained following ultrasound guided biopsy of a large lower outer quadrant right breast mass as well as an enlarged right axillary lymph node. The images show the ribbon shaped biopsy clip within the large lower outer quadrant mass. The HydroMARK clip was visualized sonographically within the axillary mass/enlarged lymph nodes. IMPRESSION: Well-positioned ribbon shaped biopsy clip following ultrasound-guided core needle biopsy of a large lower outer quadrant right breast mass. Final Assessment: Post Procedure Mammograms for Marker Placement Electronically Signed   By: Lajean Manes M.D.   On: 01/06/2016 13:53   US Breast Ltd Uni Right Inc Axilla  01/05/2016  CLINICAL DATA:  82 year old female with a palpable, ulcerated bleeding mass in the 5 o'clock region of the right breast. Patient had been treated with a course of antibiotics for possible infection without resolution. EXAM: 2D DIGITAL DIAGNOSTIC BILATERAL MAMMOGRAM WITH CAD AND ADJUNCT TOMO ULTRASOUND RIGHT BREAST COMPARISON:  Prior exam dated 04/25/2008. ACR Breast Density Category b: There are scattered areas of fibroglandular density. FINDINGS: There is diffuse skin thickening as well as trabecular thickening of the right breast. Far posteriorly in the 5 o'clock region of the right breast is a 4.1 cm mass. The posterior aspect of the mass is not visualized on the imaging. There are associated fine pleomorphic calcifications. There is enlarged axillary adenopathy associated with malignant type microcalcifications. The largest nodal mass measures 3.4 cm. Mammographic images were processed with CAD. On physical exam, there is a fungating, ulcerated bleeding mass in the right  breast at 5 o'clock 7 cm from the nipple. Fullness is felt in the right axilla. Targeted ultrasound is performed, showing an irregular hypoechoic mass in the right breast at 5 o'clock 7 cm from the nipple measuring 5.4 x 3.9 x 4.1 cm. Sonographic evaluation of the right axilla shows 3 nodal masses. The largest nodal mass measures 3.6 x 1.8 x 3.7 cm. IMPRESSION: Imaging findings are concerning for invasive mammary carcinoma with axillary metastasis. RECOMMENDATION: Ultrasound-guided core biopsies of the right breast mass is well is axillary adenopathy is recommended. This has been scheduled on 01/06/2016. I have discussed the findings and recommendations with the patient. Results were also provided in writing at the conclusion of the visit. If applicable, a reminder letter will be sent to the patient regarding the next appointment. BI-RADS CATEGORY  5: Highly suggestive of malignancy. Electronically Signed   By: Lillia Mountain M.D.   On: 01/05/2016 16:30   Mm Diag Breast Tomo Bilateral  01/05/2016  CLINICAL DATA:  82 year old female with a palpable, ulcerated bleeding mass in the 5 o'clock region of the right breast. Patient had been treated with a course of antibiotics for possible infection without resolution. EXAM: 2D DIGITAL DIAGNOSTIC BILATERAL MAMMOGRAM WITH CAD AND ADJUNCT TOMO ULTRASOUND RIGHT BREAST COMPARISON:  Prior exam dated 04/25/2008. ACR Breast Density Category b: There are scattered areas of fibroglandular density. FINDINGS: There is diffuse skin thickening as well as trabecular thickening of the right breast. Far posteriorly in the 5 o'clock region of the right breast is a 4.1 cm mass. The posterior aspect of the mass is not visualized on the imaging. There are associated fine pleomorphic calcifications. There is enlarged axillary adenopathy associated with malignant type microcalcifications. The largest nodal mass measures 3.4 cm. Mammographic images were processed with CAD. On physical exam, there  is a fungating, ulcerated bleeding mass  in the right breast at 5 o'clock 7 cm from the nipple. Fullness is felt in the right axilla. Targeted ultrasound is performed, showing an irregular hypoechoic mass in the right breast at 5 o'clock 7 cm from the nipple measuring 5.4 x 3.9 x 4.1 cm. Sonographic evaluation of the right axilla shows 3 nodal masses. The largest nodal mass measures 3.6 x 1.8 x 3.7 cm. IMPRESSION: Imaging findings are concerning for invasive mammary carcinoma with axillary metastasis. RECOMMENDATION: Ultrasound-guided core biopsies of the right breast mass is well is axillary adenopathy is recommended. This has been scheduled on 01/06/2016. I have discussed the findings and recommendations with the patient. Results were also provided in writing at the conclusion of the visit. If applicable, a reminder letter will be sent to the patient regarding the next appointment. BI-RADS CATEGORY  5: Highly suggestive of malignancy. Electronically Signed   By: Lillia Mountain M.D.   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 The right axillary lymph node is positive for ductal carcinoma and may represent a node completely replaced by tumor. This was found to be concordant by Dr. Lajean Manes. Pathology results were discussed with the patient's daughter, Jennifer Garrett, by telephone. The patient did well after the biopsy. Post biopsy instructions and care were reviewed and questions were answered. The patient was encouraged to call [facility] for any additional concerns. Surgical consultation has been arranged with Dr. Fanny Garrett at [facility] on January 12, 2016. Pathology results reported by Susa Raring RN, BSN on 01/08/2016. Electronically Signed   By: Lajean Manes  M.D.   On: 01/08/2016 07:49  01/08/2016  CLINICAL DATA:  Patient presents for ultrasound-guided biopsy of an ulcerated large lower outer quadrant right breast mass and enlarged right axillary lymph nodes. EXAM: ULTRASOUND GUIDED RIGHT BREAST AND RIGHT AXILLA CORE NEEDLE BIOPSY COMPARISON:  Previous exam(s). FINDINGS: I met with the patient and we discussed the procedure of ultrasound-guided biopsy, including benefits and alternatives. We discussed the high likelihood of a successful procedure. We discussed the risks of the procedure, including infection, bleeding, tissue injury, clip migration, and inadequate sampling. Informed written consent was given. The usual time-out protocol was performed immediately prior to the procedure. Using sterile technique and 1% Lidocaine as local anesthetic, under direct ultrasound visualization, a 14 gauge spring-loaded device was used to perform biopsy of the abnormally enlarged right axillary lymph node using a lateral, inferior approach. At the conclusion of the procedure a HydroMARK tissue marker clip was deployed into the biopsy cavity. Using sterile technique and 1% Lidocaine as local anesthetic, under direct ultrasound visualization, a 12 gauge spring-loaded device was used to perform biopsy of lower outer quadrant right breast mass using a lateral approach. At the conclusion of the procedure a ribbon shaped tissue marker clip was deployed into the biopsy cavity. Follow up 2 view mammogram was performed and dictated separately. IMPRESSION: Ultrasound guided biopsy of a right lower outer quadrant breast mass and an abnormally enlarged right axillary lymph node. No apparent complications. Electronically Signed: By: Lajean Manes M.D. On: 01/06/2016 13:50   [place] Rt Breast Bx W Loc Dev Ea Add Lesion Img Bx Spec US Guide  01/08/2016  ADDENDUM REPORT: 01/08/2016 07:49 ADDENDUM: Pathology revealed grade III invasive ductal carcinoma  and ductal carcinoma in situ in the right  breast. The right axillary lymph node is positive for ductal carcinoma and may represent a node completely replaced by tumor. This was found to be concordant by Dr. Lajean Manes. Pathology results were discussed with the patient's daughter, Jennifer Garrett, by telephone. The patient did well after the biopsy. Post biopsy instructions and care were reviewed and questions were answered. The patient was encouraged to call [facility] for any additional concerns. Surgical consultation has been arranged with Dr. Fanny Garrett at [facility] on January 12, 2016. Pathology results reported by Susa Raring RN, BSN on 01/08/2016. Electronically Signed   By: Lajean Manes M.D.   On: 01/08/2016 07:49  01/08/2016  CLINICAL DATA:  Patient presents for ultrasound-guided biopsy of an ulcerated large lower outer quadrant right breast mass and enlarged right axillary lymph nodes. EXAM: ULTRASOUND GUIDED RIGHT BREAST AND RIGHT AXILLA CORE NEEDLE BIOPSY COMPARISON:  Previous exam(s). FINDINGS: I met with the patient and we discussed the procedure of ultrasound-guided biopsy, including benefits and alternatives. We discussed the high likelihood of a successful procedure. We discussed the risks of the procedure, including infection, bleeding, tissue injury, clip migration, and inadequate sampling. Informed written consent was given. The usual time-out protocol was performed immediately prior to the procedure. Using sterile technique and 1% Lidocaine as local anesthetic, under direct ultrasound visualization, a 14 gauge spring-loaded device was used to perform biopsy of the abnormally enlarged right axillary lymph node using a lateral, inferior approach. At the conclusion of the procedure a HydroMARK tissue marker clip was deployed into the biopsy cavity. Using sterile technique and 1% Lidocaine as local anesthetic, under direct ultrasound visualization, a 12 gauge spring-loaded device was used  to perform biopsy of lower outer quadrant right breast mass using a lateral approach. At the conclusion of the procedure a ribbon shaped tissue marker clip was deployed into the biopsy cavity. Follow up 2 view mammogram was performed and dictated separately. IMPRESSION: Ultrasound guided biopsy of a right lower outer quadrant breast mass and an abnormally enlarged right axillary lymph node. No apparent complications. Electronically Signed: By: Lajean Manes M.D. On: 01/06/2016 13:50    ELIGIBLE FOR AVAILABLE RESEARCH PROTOCOL: no   ASSESSMENT: 82 y.o. [place] woman status post right breast lower outer quadrant and right axillary lymph node biopsy 01/06/2016 both positive for a clinical T4 N1, stage IIIB invasive ductal carcinoma, grade 3, triple negative, with an MIB-1 of 70%.  (1) neoadjuvant chemotherapy will consist of capecitabine, 1500 mg twice daily, 7 days on 7 days off, starting 01/27/2016  (2) right modified radical mastectomy to follow as appropriate  (3) adjuvant radiation to follow surgery  PLAN: Jennifer Garrett is taking capecitabine 1500 mg twice a day and has tolerated it well. CBC was obtained today is completely normal. Discussed with patient that she could develop diarrhea despite being off the medication this week. He was instructed to call us if she develops 5 loose stools or more so that we can consider giving her IV fluids if needed. As previously discussed, she may use Imodium to help control her diarrhea.  Bone scan and CT scan results were reviewed with the patient. The patient does not have evidence of stage IV disease. The patient and her daughter were happy to hear this information today.  She will continue doing the dressing changes as recommended by the home health nurse.  We will plan to see the patient on a weekly  basis in the beginning of her treatment and if she is tolerating well but we will change this to every 2 weeks.  The patient was seen and discussed with Dr.  Jana Hakim.   She will call with any problems that may develop before her next visit here.  Jennifer Bussing, NP   02/04/2016 12:45 PM Medical Oncology and Hematology [facility] [address] [place], [place] [address] Tel. [phone]    Fax. [phone]

## 2016-02-05 ENCOUNTER — Telehealth: Payer: Self-pay | Admitting: *Deleted

## 2016-02-05 MED ORDER — METRONIDAZOLE 500 MG PO TABS: ORAL_TABLET | ORAL | Status: DC

## 2016-02-05 NOTE — Telephone Encounter (Signed)
Per call with [facility] wound nurse request for use of flagyl - to be used topically on fungating wound for odor control.  Order obtained and sent to pt's pharmacy.

## 2016-02-05 NOTE — Telephone Encounter (Signed)
Left vm for return call to give navigation resources.

## 2016-02-06 ENCOUNTER — Telehealth: Payer: Self-pay | Admitting: Pharmacist

## 2016-02-06 NOTE — Telephone Encounter (Signed)
Oral Chemotherapy Follow-Up Form  Original Start date of oral chemotherapy: 01/26/16  Robyn Haber (pt daughter) today to follow up regarding patient's oral chemotherapy medication: Xeloda  Pt is doing well today. No side effects to report. No issues.   Pt reports 0 tablets/doses missed in the last week.   Will follow up with patient again in 2 weeks - Pt has labs and sees Erasmo Downer on 02/16/16 [name] (ph# [phone]).    Thank you, Raul Del, PharmD, BCPS, [facility] [phone]

## 2016-02-07 ENCOUNTER — Telehealth: Payer: Self-pay | Admitting: Oncology

## 2016-02-07 NOTE — Telephone Encounter (Signed)
S/w pt's daughter, gave appt 7/24 @ 10.15am.

## 2016-02-09 ENCOUNTER — Other Ambulatory Visit (HOSPITAL_BASED_OUTPATIENT_CLINIC_OR_DEPARTMENT_OTHER): Payer: Medicare Other

## 2016-02-09 ENCOUNTER — Ambulatory Visit: Payer: Medicare Other | Admitting: Hematology and Oncology

## 2016-02-09 ENCOUNTER — Telehealth: Payer: Self-pay | Admitting: Oncology

## 2016-02-09 ENCOUNTER — Ambulatory Visit (HOSPITAL_BASED_OUTPATIENT_CLINIC_OR_DEPARTMENT_OTHER): Payer: Medicare Other | Admitting: Oncology

## 2016-02-09 VITALS — BP 122/54 | HR 63 | Temp 98.4°F | Resp 18 | Ht 62.0 in | Wt 164.0 lb

## 2016-02-09 DIAGNOSIS — C50311 Malignant neoplasm of lower-inner quadrant of right female breast: Secondary | ICD-10-CM

## 2016-02-09 DIAGNOSIS — Z170 Estrogen receptor positive status [ER+]: Secondary | ICD-10-CM

## 2016-02-09 DIAGNOSIS — C773 Secondary and unspecified malignant neoplasm of axilla and upper limb lymph nodes: Secondary | ICD-10-CM | POA: Diagnosis not present

## 2016-02-09 LAB — CBC WITH DIFFERENTIAL/PLATELET
BASO%: 0.7 % (ref 0.0–2.0)
BASOS ABS: 0.1 10*3/uL (ref 0.0–0.1)
EOS ABS: 0.2 10*3/uL (ref 0.0–0.5)
EOS%: 2.8 % (ref 0.0–7.0)
HCT: 36.1 % (ref 34.8–46.6)
HEMOGLOBIN: 11.6 g/dL (ref 11.6–15.9)
LYMPH%: 43.5 % (ref 14.0–49.7)
MCH: 27.2 pg (ref 25.1–34.0)
MCHC: 32.1 g/dL (ref 31.5–36.0)
MCV: 84.5 fL (ref 79.5–101.0)
MONO#: 0.8 10*3/uL (ref 0.1–0.9)
MONO%: 10.6 % (ref 0.0–14.0)
NEUT%: 42.4 % (ref 38.4–76.8)
NEUTROS ABS: 3.2 10*3/uL (ref 1.5–6.5)
Platelets: 255 10*3/uL (ref 145–400)
RBC: 4.27 10*6/uL (ref 3.70–5.45)
RDW: 13.1 % (ref 11.2–14.5)
WBC: 7.6 10*3/uL (ref 3.9–10.3)
lymph#: 3.3 10*3/uL (ref 0.9–3.3)

## 2016-02-09 LAB — COMPREHENSIVE METABOLIC PANEL
ALBUMIN: 3.6 g/dL (ref 3.5–5.0)
ALK PHOS: 58 U/L (ref 40–150)
ALT: 11 U/L (ref 0–55)
AST: 25 U/L (ref 5–34)
Anion Gap: 9 mEq/L (ref 3–11)
BILIRUBIN TOTAL: 0.43 mg/dL (ref 0.20–1.20)
BUN: 21.9 mg/dL (ref 7.0–26.0)
CALCIUM: 10.3 mg/dL (ref 8.4–10.4)
CO2: 27 mEq/L (ref 22–29)
Chloride: 105 mEq/L (ref 98–109)
Creatinine: 1.3 mg/dL — ABNORMAL HIGH (ref 0.6–1.1)
EGFR: 43 mL/min/{1.73_m2} — AB (ref >90)
GLUCOSE: 85 mg/dL (ref 70–140)
POTASSIUM: 4.2 meq/L (ref 3.5–5.1)
Sodium: 141 mEq/L (ref 136–145)
TOTAL PROTEIN: 8 g/dL (ref 6.4–8.3)

## 2016-02-09 MED ORDER — CAPECITABINE 500 MG PO TABS: 1500.0000 mg | ORAL_TABLET | Freq: Two times a day (BID) | ORAL | 6 refills | Status: DC

## 2016-02-09 MED FILL — XELODA 500 MG TABLET: 500 | 7 days supply | Qty: 42 | Fill #1

## 2016-02-09 NOTE — Progress Notes (Signed)
Jennifer Garrett  [phone] [phone]     ID: Jennifer Garrett DOB: 03-20-1932  MR#: [id number]  EKC#:[id number]  Patient Care Team: Jennifer Ebbs, MD as PCP - General (Internal Medicine) Jennifer Skates, MD as Consulting Physician (General Surgery) Jennifer Cruel, MD as Consulting Physician (Oncology) Jennifer Breeding, MD as Consulting Physician (Cardiology) PCP: Jennifer Fendt, MD Jennifer Cruel, MD GYN: SU:  OTHER MD:  CHIEF COMPLAINT: Locally advanced triple negative breast cancer  CURRENT TREATMENT:capecitabine 1500 mg BID 7 days on then 7 days off. Started 01/27/2016.   BREAST CANCER HISTORY: From the original intake note:  The patient presented to the emergency room 11/20/2015 with a complaint of a mass in the lower aspect of her right breast, which was painful and bleeding. The patient stated the mass had been present approximately a month. An ultrasound of the breast was obtained which found a 4.4 cm mass in the right breast at the 6:00 position. This was read as most consistent with an abscess. The patient was started on clindamycin and referred to the [facility], where on 01/05/2016 she underwent bilateral diagnostic mammography with tomography and right  ultrasonography.   Mammography showed far posteriorly in the 5:00 region of the right breast a 4.1 cm mass with enlarged axillary adenopathy. On exam there was a fungating ulcerated bleeding mass in the right breast at the 5:00 position 7 cm from the nipple. The right axilla was "full" on palpation. Ultrasound showed a 5.4 cm mass at the 5:00 position of the right breast 7 cm from the nipple, with at least 3 enlarged abnormal axillary lymph nodes, the largest measuring 3.7 cm.  Biopsy of the right breast mass and one axillary lymph node on 01/06/2016 showed ([id number]) both to be positive for an invasive ductal carcinoma, grade 3, triple negative, with the HER-2 signals ratio between  1.21-1.40 and the number per cell 2.85-3.00. The MIB-1-1 was 70%.  The patient's subsequent history is as detailed below  INTERVAL HISTORY: Jennifer Garrett returns today for follow-up of her breast cancer accompanied by her daughter and grandson. She has completed her week "off" capecitabine. She has absolutely no symptoms related to her chemotherapy. She has had no diarrhea, no mouth sores, and no palmar plantar erythrodysesthesia. She is scheduled to resume treatment tomorrow  REVIEW OF SYSTEMS: Jennifer Garrett is doing some cleaning around the house. She tells me her appetite is poor. He is actually having fewer bowel movements than usual. However she is not constipated. She does complain of arthritis and forgetfulness, but these are not use symptoms and there are not more intense or persistent than before. A detailed review of systems today was otherwise stable.       PAST MEDICAL HISTORY: Past Medical History:  Diagnosis Date  . Arthritis   . Asthma    rarely uses neb  . Breast cancer of lower-inner quadrant of right female breast ([place]) 01/22/2016  . Carpal tunnel syndrome   . Coronary artery disease   . Diabetes mellitus   . Hyperlipemia   . Hypertension   . Shortness of breath     PAST SURGICAL HISTORY: Past Surgical History:  Procedure Laterality Date  . ABDOMINAL HYSTERECTOMY    . CARDIAC CATHETERIZATION  2003   stent-  . CARPAL TUNNEL RELEASE  03/29/2012   Procedure: CARPAL TUNNEL RELEASE;  Surgeon: Jennifer Sours, MD;  Location: [facility];  Service: Orthopedics;  Laterality: Left;  . SHOULDER ARTHROSCOPY  right and left  . TRIGGER FINGER RELEASE  03/29/2012   Procedure: RELEASE TRIGGER FINGER/A-1 PULLEY;  Surgeon: Jennifer Sours, MD;  Location: [facility];  Service: Orthopedics;  Laterality: Left;    FAMILY HISTORY Family History  Problem Relation Age of Onset  . Heart disease Mother   The patient's father died from "old age" at age 35. The patient's mother  died at age 54 from complications of high blood pressure and diabetes. The patient had one brother, 4 sisters. There is no cancer in the family to her knowledge   GYNECOLOGIC HISTORY:  No LMP recorded. Patient has had a hysterectomy.  menarche age 67, first live birth age 57. The patient is GX P2. She underwent abdominal hysterectomy with bilateral salpingo-oophorectomy in her 95s. She did not undergo hormone replacement  SOCIAL HISTORY:  Jennifer Garrett used to work for [employer]. She lives with her daughter Jennifer Garrett, who is Medical sales representative for [employer]. The patient's son Jennifer Garrett lives in [place]. The patient has 1 grandchild, no great-grandchildren. She attends a local [place] DIRECTIVES:  not in place. At the 01/22/2016 visit the patient was given the appropriate documents to complete. She tells me she intends to name her daughter Jennifer Garrett as her healthcare part of attorney    HEALTH MAINTENANCE: Social History  Substance Use Topics  . Smoking status: Former Smoker    Quit date: 03/24/1978  . Smokeless tobacco: Not on file  . Alcohol use No     Colonoscopy: Never   PAP: status post hysterectomy   Bone density: Never    Allergies  Allergen Reactions  . Lisinopril Other (See Comments)    States that it makes her "sensitive to light"  . Lyrica [Pregabalin] Swelling  . Feldene [Piroxicam] Rash  . Orudis [Ketoprofen] Rash  . Vibramycin [Doxycycline Calcium] Rash and Hives    Current Outpatient Prescriptions  Medication Sig Dispense Refill  . albuterol (PROVENTIL HFA;VENTOLIN HFA) 108 (90 BASE) MCG/ACT inhaler Inhale 1 puff into the lungs every 6 (six) hours as needed for wheezing or shortness of breath. Reported on 02/04/2016    . aspirin EC 81 MG tablet Take 1 tablet by mouth every morning.    . calcium carbonate (OS-CAL) 600 MG TABS Take 600 mg by mouth 2 (two) times daily with a meal.    . capecitabine (XELODA) 500 MG tablet Take 3  tablets (1,500 mg total) by mouth 2 (two) times daily after a meal. Due to start on 02/10/16 42 tablet 6  . diclofenac sodium (VOLTAREN) 1 % GEL Apply 1 application topically every other day.    Marland Kitchen glipiZIDE (GLUCOTROL) 10 MG tablet Take 10 mg by mouth 2 (two) times daily before a meal.    . hydrochlorothiazide (HYDRODIURIL) 25 MG tablet Take 25 mg by mouth daily.    Marland Kitchen KLOR-CON M20 20 MEQ tablet Take 20 mEq by mouth daily.    Marland Kitchen olmesartan (BENICAR) 40 MG tablet Take 40 mg by mouth daily.    . simvastatin (ZOCOR) 20 MG tablet Take 20 mg by mouth every evening.    . sitaGLIPtin (JANUVIA) 100 MG tablet Take 100 mg by mouth daily.     No current facility-administered medications for this visit.     OBJECTIVE: Older African-American woman In no acute distress Vitals:   02/09/16 1041  BP: (!) 122/54  Pulse: 63  Resp: 18  Temp: 98.4 F (36.9 C)  Body mass index is 30 kg/m.    ECOG FS:0 - Asymptomatic  Sclerae unicteric, pupils round and equal Oropharynx clear and moist-- no thrush or other lesions No cervical or supraclavicular adenopathy Lungs no rales or rhonchi Heart regular rate and rhythm Abd soft, obese, nontender, positive bowel sounds MSK no focal spinal tenderness, no upper extremity lymphedema Neuro: nonfocal, well oriented, appropriate affect Breasts: Deferred   01/22/2016    LAB RESULTS:  CMP     Component Value Date/Time   NA 141 02/09/2016 1022   K 4.2 02/09/2016 1022   CL 106 11/20/2015 2210   CO2 27 02/09/2016 1022   GLUCOSE 85 02/09/2016 1022   BUN 21.9 02/09/2016 1022   CREATININE 1.3 (H) 02/09/2016 1022   CALCIUM 10.3 02/09/2016 1022   PROT 8.0 02/09/2016 1022   ALBUMIN 3.6 02/09/2016 1022   AST 25 02/09/2016 1022   ALT 11 02/09/2016 1022   ALKPHOS 58 02/09/2016 1022   BILITOT 0.43 02/09/2016 1022   GFRNONAA 55 (L) 11/20/2015 2210   GFRAA >60 11/20/2015 2210    INo results found for: SPEP, UPEP  Lab Results  Component Value Date   WBC 7.6  02/09/2016   NEUTROABS 3.2 02/09/2016   HGB 11.6 02/09/2016   HCT 36.1 02/09/2016   MCV 84.5 02/09/2016   PLT 255 02/09/2016      Chemistry      Component Value Date/Time   NA 141 02/09/2016 1022   K 4.2 02/09/2016 1022   CL 106 11/20/2015 2210   CO2 27 02/09/2016 1022   BUN 21.9 02/09/2016 1022   CREATININE 1.3 (H) 02/09/2016 1022      Component Value Date/Time   CALCIUM 10.3 02/09/2016 1022   ALKPHOS 58 02/09/2016 1022   AST 25 02/09/2016 1022   ALT 11 02/09/2016 1022   BILITOT 0.43 02/09/2016 1022       No results found for: LABCA2  No components found for: LABCA125  No results for input(s): INR in the last 168 hours.  Urinalysis    Component Value Date/Time   COLORURINE YELLOW 11/28/2013 0143   APPEARANCEUR CLEAR 11/28/2013 0143   LABSPEC 1.020 11/28/2013 0143   PHURINE 6.0 11/28/2013 0143   GLUCOSEU NEGATIVE 11/28/2013 0143   HGBUR NEGATIVE 11/28/2013 0143   BILIRUBINUR NEGATIVE 11/28/2013 0143   KETONESUR NEGATIVE 11/28/2013 0143   PROTEINUR NEGATIVE 11/28/2013 0143   UROBILINOGEN 1.0 11/28/2013 0143   NITRITE NEGATIVE 11/28/2013 0143   LEUKOCYTESUR NEGATIVE 11/28/2013 0143     STUDIES: Ct Chest W Contrast  Result Date: 01/27/2016 CLINICAL DATA:  New diagnosis of breast cancer EXAM: CT CHEST WITH CONTRAST TECHNIQUE: Multidetector CT imaging of the chest was performed during intravenous contrast administration. CONTRAST:  43m ISOVUE-300 IOPAMIDOL (ISOVUE-300) INJECTION 61% COMPARISON:  06/17/2014 FINDINGS: Mediastinum/Lymph Nodes: The heart size appears normal. There is no pericardial effusion identified. Aortic atherosclerosis noted. Calcification in the LAD and left circumflex Coronary artery is identified. No enlarged mediastinal or hilar lymph nodes. No internal mammary adenopathy identified. Multi nodular thyroid gland is identified with substernal extension. Lungs/Pleura: There is no pleural fluid. Mild changes of emphysema. 4 mm lung nodule is  identified within the right upper lobe, image 41 of series 5. Nonspecific. Not confidently present on previous examination. Subpleural scar like density along the minor fissure is unchanged, image 80 of series 5. Upper abdomen: There is no focal liver abnormality. The adrenal glands are unremarkable. The visualized portions of the spleen and kidneys are unremarkable. Musculoskeletal: Diffuse  skin thickening overlying the right breast is identified. Right breast mass containing clip is identified measuring 4.4 cm, image 95 of series 2. Enlarged right axillary lymph nodes are identified. Index lymph node measures 2.1 cm, image 24 of series 2. There is a right supraclavicular lymph node which is borderline enlarged measuring 9 mm, image 6 of series 2. No left-sided supraclavicular or axillary adenopathy. IMPRESSION: 1. Right breast mass compatible with newly diagnosed breast carcinoma. 2. Enlarged right axillary lymph nodes and borderline right supraclavicular lymph node. 3. No evidence for mediastinal or hilar adenopathy or pulmonary metastasis. 4. Small nodule in the right upper lobe measures 4 mm. Attention on follow-up imaging advise. 5. Aortic atherosclerosis and multi vessel Coronary artery calcification. Electronically Signed   By: Kerby Moors M.D.   On: 01/27/2016 15:35   Nm Bone Scan Whole Body  Result Date: 01/27/2016 CLINICAL DATA:  Newly diagnosed breast carcinoma EXAM: NUCLEAR MEDICINE WHOLE BODY BONE SCAN TECHNIQUE: Whole body anterior and posterior images were obtained approximately 3 hours after intravenous injection of radiopharmaceutical. RADIOPHARMACEUTICALS:  Twenty-six mCi Technetium-67mMDP IV COMPARISON:  01/27/2016 FINDINGS: There is adequate uptake of radioactive tracer throughout the bony skeleton. Excessive soft tissue uptake is noted in the right breast consistent with the patient's underlying history of breast carcinoma. Degenerative changes are noted within the thoracolumbar spine  as well as in the sacroiliac joints. No focal area of increased activity to suggest metastatic disease is noted. Degenerative change of the right knee joint is noted as well. IMPRESSION: No definitive metastatic disease is identified. Excessive soft tissue uptake in the right breast is noted consistent with the given clinical history. Degenerative changes as described. Electronically Signed   By: MInez CatalinaM.D.   On: 01/27/2016 15:40    ELIGIBLE FOR AVAILABLE RESEARCH PROTOCOL: no   ASSESSMENT: 82y.o. [place] woman status post right breast lower outer quadrant and right axillary lymph node biopsy 01/06/2016 both positive for a clinical T4 N1, stage IIIB invasive ductal carcinoma, grade 3, triple negative, with an MIB-1 of 70%.  (a) CT scan of the chest and bone scan 01/27/2016 showed no evidence of metastatic disease.   (1) neoadjuvant chemotherapy will consist of capecitabine, 1500 mg twice daily, 7 days on 7 days off, starting 01/27/2016  (2) right modified radical mastectomy to follow as appropriate  (3) adjuvant radiation to follow surgery  PLAN: RLollyis tolerating the capecitabine so well that I think we can back off a little bit on the very intense follow-up process we had set up. We are canceling the July 31 visit. On August 7 she will return for labs alone. She will then return to see me in August 16 and we will do our first on-treatment breast exam at that visit.  Before then she will have her breast MRI which will be a definitive baseline.  They are aware of concerns regarding particularly diarrhea leading to severe dehydration, but also of course problems with mouth sores or palmar plantar erythrodysesthesia. If any of those symptoms develop they will call  The plan in general is to continue the capecitabine to maximum response or 2 tolerance. After that the patient will proceed to surgery.  MChauncey Cruel MD   02/09/2016 11:09 AM Medical Oncology and Hemat[facility] [address]AS[address] [place] [address]Tel. [phone]   Fax. [phone]

## 2016-02-09 NOTE — Telephone Encounter (Signed)
appt made and avs printed °

## 2016-02-10 ENCOUNTER — Ambulatory Visit (INDEPENDENT_AMBULATORY_CARE_PROVIDER_SITE_OTHER): Payer: Medicare Other | Admitting: Podiatry

## 2016-02-10 ENCOUNTER — Encounter: Payer: Self-pay | Admitting: Podiatry

## 2016-02-10 DIAGNOSIS — M79673 Pain in unspecified foot: Secondary | ICD-10-CM

## 2016-02-10 DIAGNOSIS — E1140 Type 2 diabetes mellitus with diabetic neuropathy, unspecified: Secondary | ICD-10-CM

## 2016-02-10 DIAGNOSIS — B351 Tinea unguium: Secondary | ICD-10-CM | POA: Diagnosis not present

## 2016-02-10 NOTE — Progress Notes (Signed)
Rx for xeloda faxed to [facility] OP

## 2016-02-10 NOTE — Progress Notes (Signed)
Patient ID: Jennifer Garrett, female   DOB: 30-Oct-1931, 82 y.o.   MRN: [id number] Complaint:  Visit Type: Patient returns to my office for continued preventative foot care services. Complaint: Patient states" my nails have grown long and thick and become painful to walk and wear shoes" Patient has been diagnosed with DM with no foot complications. The patient presents for preventative foot care services. No changes to ROS  Podiatric Exam: Vascular: dorsalis pedis and posterior tibial pulses are palpable bilateral. Capillary return is immediate. Temperature gradient is WNL. Skin turgor WNL  Sensorium: Decreased  Semmes Weinstein monofilament test. Normal tactile sensation bilaterally. Nail Exam: Pt has thick disfigured discolored nails with subungual debris noted bilateral entire nail hallux through fifth toenails Ulcer Exam: There is no evidence of ulcer or pre-ulcerative changes or infection. Orthopedic Exam: Muscle tone and strength are WNL. No limitations in general ROM. No crepitus or effusions noted. Foot type and digits show no abnormalities. Bony prominences are unremarkable. Skin: No Porokeratosis. No infection or ulcers  Diagnosis:  Onychomycosis, , Pain in right toe, pain in left toes  Treatment & Plan Procedures and Treatment: Consent by patient was obtained for treatment procedures. The patient understood the discussion of treatment and procedures well. All questions were answered thoroughly reviewed. Debridement of mycotic and hypertrophic toenails, 1 through 5 bilateral and clearing of subungual debris. No ulceration, no infection noted.  Return Visit-Office Procedure: Patient instructed to return to the office for a follow up visit 3 months for continued evaluation and treatment.    Gardiner Barefoot DPM

## 2016-02-12 ENCOUNTER — Ambulatory Visit
Admission: RE | Admit: 2016-02-12 | Discharge: 2016-02-12 | Disposition: A | Discharge status: Home / Self Care | Payer: Medicare Other | Source: Ambulatory Visit | Attending: General Surgery | Admitting: General Surgery

## 2016-02-12 DIAGNOSIS — C50911 Malignant neoplasm of unspecified site of right female breast: Secondary | ICD-10-CM

## 2016-02-12 DIAGNOSIS — C773 Secondary and unspecified malignant neoplasm of axilla and upper limb lymph nodes: Principal | ICD-10-CM

## 2016-02-12 MED ORDER — GADOBENATE DIMEGLUMINE 529 MG/ML IV SOLN
15.0000 mL | Freq: Once | INTRAVENOUS | Status: AC | PRN
  Administered 2016-02-12: 15 mL via INTRAVENOUS

## 2016-02-13 ENCOUNTER — Emergency Department (HOSPITAL_COMMUNITY): Payer: Medicare Other

## 2016-02-13 ENCOUNTER — Emergency Department (HOSPITAL_COMMUNITY)
Admission: EM | Admit: 2016-02-13 | Discharge: 2016-02-13 | Disposition: A | Discharge status: Home / Self Care | Payer: Medicare Other | Attending: Emergency Medicine | Admitting: Emergency Medicine

## 2016-02-13 ENCOUNTER — Encounter (HOSPITAL_COMMUNITY): Payer: Self-pay | Admitting: Emergency Medicine

## 2016-02-13 DIAGNOSIS — R4182 Altered mental status, unspecified: Secondary | ICD-10-CM | POA: Diagnosis present

## 2016-02-13 DIAGNOSIS — I2510 Atherosclerotic heart disease of native coronary artery without angina pectoris: Secondary | ICD-10-CM | POA: Insufficient documentation

## 2016-02-13 DIAGNOSIS — Z79899 Other long term (current) drug therapy: Secondary | ICD-10-CM | POA: Diagnosis not present

## 2016-02-13 DIAGNOSIS — Z8673 Personal history of transient ischemic attack (TIA), and cerebral infarction without residual deficits: Secondary | ICD-10-CM | POA: Diagnosis not present

## 2016-02-13 DIAGNOSIS — Z7984 Long term (current) use of oral hypoglycemic drugs: Secondary | ICD-10-CM | POA: Insufficient documentation

## 2016-02-13 DIAGNOSIS — Z87891 Personal history of nicotine dependence: Secondary | ICD-10-CM | POA: Diagnosis not present

## 2016-02-13 DIAGNOSIS — E11649 Type 2 diabetes mellitus with hypoglycemia without coma: Secondary | ICD-10-CM | POA: Insufficient documentation

## 2016-02-13 DIAGNOSIS — Z955 Presence of coronary angioplasty implant and graft: Secondary | ICD-10-CM | POA: Insufficient documentation

## 2016-02-13 DIAGNOSIS — J45909 Unspecified asthma, uncomplicated: Secondary | ICD-10-CM | POA: Diagnosis not present

## 2016-02-13 DIAGNOSIS — C50311 Malignant neoplasm of lower-inner quadrant of right female breast: Secondary | ICD-10-CM | POA: Diagnosis not present

## 2016-02-13 DIAGNOSIS — E162 Hypoglycemia, unspecified: Secondary | ICD-10-CM

## 2016-02-13 DIAGNOSIS — I10 Essential (primary) hypertension: Secondary | ICD-10-CM | POA: Diagnosis not present

## 2016-02-13 DIAGNOSIS — Z7982 Long term (current) use of aspirin: Secondary | ICD-10-CM | POA: Insufficient documentation

## 2016-02-13 DIAGNOSIS — R791 Abnormal coagulation profile: Secondary | ICD-10-CM | POA: Insufficient documentation

## 2016-02-13 LAB — CBC
HCT: 36.2 % (ref 36.0–46.0)
HEMOGLOBIN: 11.4 g/dL — AB (ref 12.0–15.0)
MCH: 26.8 pg (ref 26.0–34.0)
MCHC: 31.5 g/dL (ref 30.0–36.0)
MCV: 85 fL (ref 78.0–100.0)
Platelets: 287 10*3/uL (ref 150–400)
RBC: 4.26 MIL/uL (ref 3.87–5.11)
RDW: 13.3 % (ref 11.5–15.5)
WBC: 7.6 10*3/uL (ref 4.0–10.5)

## 2016-02-13 LAB — URINALYSIS, ROUTINE W REFLEX MICROSCOPIC
Bilirubin Urine: NEGATIVE
GLUCOSE, UA: NEGATIVE mg/dL
Hgb urine dipstick: NEGATIVE
KETONES UR: NEGATIVE mg/dL
LEUKOCYTES UA: NEGATIVE
NITRITE: NEGATIVE
PROTEIN: NEGATIVE mg/dL
Specific Gravity, Urine: 1.016 (ref 1.005–1.030)
pH: 5.5 (ref 5.0–8.0)

## 2016-02-13 LAB — COMPREHENSIVE METABOLIC PANEL
ALBUMIN: 3.6 g/dL (ref 3.5–5.0)
ALK PHOS: 48 U/L (ref 38–126)
ALT: 14 U/L (ref 14–54)
AST: 32 U/L (ref 15–41)
Anion gap: 9 (ref 5–15)
BILIRUBIN TOTAL: 0.6 mg/dL (ref 0.3–1.2)
BUN: 18 mg/dL (ref 6–20)
CALCIUM: 9.6 mg/dL (ref 8.9–10.3)
CO2: 27 mmol/L (ref 22–32)
CREATININE: 1.19 mg/dL — AB (ref 0.44–1.00)
Chloride: 103 mmol/L (ref 101–111)
GFR calc Af Amer: 48 mL/min — ABNORMAL LOW (ref >60)
GFR, EST NON AFRICAN AMERICAN: 42 mL/min — AB (ref >60)
GLUCOSE: 41 mg/dL — AB (ref 65–99)
POTASSIUM: 3.4 mmol/L — AB (ref 3.5–5.1)
Sodium: 139 mmol/L (ref 135–145)
TOTAL PROTEIN: 7.6 g/dL (ref 6.5–8.1)

## 2016-02-13 LAB — ETHANOL: Alcohol, Ethyl (B): <5 mg/dL (ref <5)

## 2016-02-13 LAB — CBG MONITORING, ED
GLUCOSE-CAPILLARY: 145 mg/dL — AB (ref 65–99)
Glucose-Capillary: 31 mg/dL — CL (ref 65–99)

## 2016-02-13 LAB — I-STAT TROPONIN, ED: TROPONIN I, POC: 0.02 ng/mL (ref 0.00–0.08)

## 2016-02-13 LAB — DIFFERENTIAL
BASOS ABS: 0 10*3/uL (ref 0.0–0.1)
Basophils Relative: 0 %
EOS ABS: 0.1 10*3/uL (ref 0.0–0.7)
Eosinophils Relative: 2 %
LYMPHS ABS: 2 10*3/uL (ref 0.7–4.0)
Lymphocytes Relative: 26 %
Monocytes Absolute: 1.1 10*3/uL — ABNORMAL HIGH (ref 0.1–1.0)
Monocytes Relative: 15 %
NEUTROS PCT: 57 %
Neutro Abs: 4.3 10*3/uL (ref 1.7–7.7)

## 2016-02-13 LAB — RAPID URINE DRUG SCREEN, HOSP PERFORMED
Amphetamines: NOT DETECTED
BARBITURATES: NOT DETECTED
Benzodiazepines: NOT DETECTED
Cocaine: NOT DETECTED
Opiates: NOT DETECTED
TETRAHYDROCANNABINOL: NOT DETECTED

## 2016-02-13 LAB — PROTIME-INR
INR: 1.13
Prothrombin Time: 14.5 seconds (ref 11.4–15.2)

## 2016-02-13 LAB — APTT: APTT: 28 s (ref 24–36)

## 2016-02-13 MED ORDER — DEXTROSE 50 % IV SOLN
INTRAVENOUS | Status: AC
  Administered 2016-02-13: 50 mL
  Filled 2016-02-13: qty 50

## 2016-02-13 MED ORDER — ACCU-CHEK SOFT TOUCH LANCETS MISC: 12 refills | Status: DC

## 2016-02-13 MED ORDER — ACCU-CHEK AVIVA PLUS W/DEVICE KIT: 1.0000 | PACK | Freq: Two times a day (BID) | 0 refills | Status: DC

## 2016-02-13 MED ORDER — GLUCOSE BLOOD VI STRP: ORAL_STRIP | 12 refills | Status: DC

## 2016-02-13 NOTE — Discharge Instructions (Signed)
Do not take any diabetes medicine for the next 2 days and monitor blood sugar to make sure it remains okay. If it stays greater than 150 start the glipizide back and do not start Januvia until you see your doctor. Make sure you are eating 3 meals a day

## 2016-02-13 NOTE — ED Provider Notes (Addendum)
[facility] DEPT Provider Note   CSN: [id number] Arrival date & time: 02/13/16  1448  First Provider Contact:  First MD Initiated Contact with Patient 02/13/16 [phone]        History   Chief Complaint Chief Complaint  Patient presents with  . Altered Mental Status    HPI Jennifer Garrett is a 82 y.o. female.  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 Daughter states she was fine when she went to bed last night but since she has been awake this morning she has been saying things that don't make sense, having difficulty with her balance. Daughter states last night when she was going to the bathroom she said something to her mother and she did not answer the way she normally was so fear is that this was going on in the middle of the night as well. Patient did try to sit down on a couch's morning and fell off the couch but denied hitting her head. Daughter states in the past she has had a stroke and it affected her balance but she has never had anything like this. She has been on the same chemotherapy medication for the last 3 weeks and her scans showed no signs of metastases to the brain. She currently does not take blood thinners. Daughter denies any recent changes in appetite, fevers or infectious symptoms. No known falls or head trauma.   The history is provided by a caregiver and the patient.  Altered Mental Status   This is a new problem. Episode onset: since being up this morning. The problem has not changed since onset.Associated symptoms include confusion. Associated symptoms comments: Difficulty speaking, doing things that don't make sense and balance issues.    Past Medical History:  Diagnosis Date  . Arthritis   . Asthma    rarely uses neb  . Breast  cancer of lower-inner quadrant of right female breast ([place]) 01/22/2016  . Carpal tunnel syndrome   . Coronary artery disease   . Diabetes mellitus   . Hyperlipemia   . Hypertension   . Shortness of breath     Patient Active Problem List   Diagnosis Date Noted  . Breast cancer of lower-inner quadrant of right female breast ([place]) 01/22/2016    Past Surgical History:  Procedure Laterality Date  . ABDOMINAL HYSTERECTOMY    . CARDIAC CATHETERIZATION  2003   stent-  . CARPAL TUNNEL RELEASE  03/29/2012   Procedure: CARPAL TUNNEL RELEASE;  Surgeon: Wynonia Sours, MD;  Location: [place];  Service: Orthopedics;  Laterality: Left;  . SHOULDER ARTHROSCOPY     right and left  . TRIGGER FINGER RELEASE  03/29/2012   Procedure: RELEASE TRIGGER FINGER/A-1 PULLEY;  Surgeon: Wynonia Sours, MD;  Location: [place];  Service: Orthopedics;  Laterality: Left;    OB History    No data available       Home Medications    Prior to Admission medications   Medication Sig Start Date End Date Taking? Authorizing Provider  albuterol (PROVENTIL HFA;VENTOLIN HFA) 108 (90 BASE) MCG/ACT inhaler Inhale 1 puff into the lungs every 6 (six) hours as needed for wheezing or shortness of breath. Reported on 02/04/2016    Historical Provider, MD  aspirin EC 81 MG tablet Take 1 tablet by mouth every  morning.    Historical Provider, MD  calcium carbonate (OS-CAL) 600 MG TABS Take 600 mg by mouth 2 (two) times daily with a meal.    Historical Provider, MD  capecitabine (XELODA) 500 MG tablet Take 3 tablets (1,500 mg total) by mouth 2 (two) times daily after a meal. Due to start on 02/10/16 02/09/16   Chauncey Cruel, MD  diclofenac sodium (VOLTAREN) 1 % GEL Apply 1 application topically every other day. 01/08/13   Historical Provider, MD  glipiZIDE (GLUCOTROL) 10 MG tablet Take 10 mg by mouth 2 (two) times daily before a meal.    Historical Provider, MD  hydrochlorothiazide (HYDRODIURIL) 25  MG tablet Take 25 mg by mouth daily.    Historical Provider, MD  KLOR-CON M20 20 MEQ tablet Take 20 mEq by mouth daily. 06/15/14   Historical Provider, MD  olmesartan (BENICAR) 40 MG tablet Take 40 mg by mouth daily.    Historical Provider, MD  simvastatin (ZOCOR) 20 MG tablet Take 20 mg by mouth every evening.    Historical Provider, MD  sitaGLIPtin (JANUVIA) 100 MG tablet Take 100 mg by mouth daily.    Historical Provider, MD    Family History Family History  Problem Relation Age of Onset  . Heart disease Mother     Social History Social History  Substance Use Topics  . Smoking status: Former Smoker    Quit date: 03/24/1978  . Smokeless tobacco: Not on file  . Alcohol use No     Allergies   Lisinopril; Lyrica [pregabalin]; Feldene [piroxicam]; Orudis [ketoprofen]; and Vibramycin [doxycycline calcium]   Review of Systems Review of Systems  Psychiatric/Behavioral: Positive for confusion.  All other systems reviewed and are negative.    Physical Exam Updated Vital Signs BP 139/61 (BP Location: Left Arm)   Pulse 64   Temp 97.8 F (36.6 C) (Oral)   Resp 21   SpO2 96%   Physical Exam  Constitutional: She appears well-developed and well-nourished. No distress.  HENT:  Head: Normocephalic and atraumatic.  Mouth/Throat: Oropharynx is clear and moist.  Eyes: Conjunctivae and EOM are normal. Pupils are equal, round, and reactive to light.  Neck: Normal range of motion. Neck supple.  Cardiovascular: Normal rate, regular rhythm and intact distal pulses.   No murmur heard. Pulmonary/Chest: Effort normal and breath sounds normal. No respiratory distress. She has no wheezes. She has no rales.  Foul-smelling breast wound on the right  Abdominal: Soft. She exhibits no distension. There is no tenderness. There is no rebound and no guarding.  Musculoskeletal: Normal range of motion. She exhibits no edema or tenderness.  Neurological: She is alert. She has normal strength. No  sensory deficit.  Oriented to person and place but unable to recall the year or the president.  Patient is unable to understand how to complete heel-to-shin testing and cannot cooperate with visual field testing either. She can tell me how many fingers I hold up. She will not speak much so it is difficult to assess if she is aphasic  Skin: Skin is warm and dry. No rash noted. No erythema.  Psychiatric: She has a normal mood and affect. Her behavior is normal.  Nursing note and vitals reviewed.    ED Treatments / Results  Labs (all labs ordered are listed, but only abnormal results are displayed) Labs Reviewed  CBC - Abnormal; Notable for the following:       Result Value   Hemoglobin 11.4 (*)    All other components  within normal limits  DIFFERENTIAL - Abnormal; Notable for the following:    Monocytes Absolute 1.1 (*)    All other components within normal limits  COMPREHENSIVE METABOLIC PANEL - Abnormal; Notable for the following:    Potassium 3.4 (*)    Glucose, Bld 41 (*)    Creatinine, Ser 1.19 (*)    GFR calc non Af Amer 42 (*)    GFR calc Af Amer 48 (*)    All other components within normal limits  CBG MONITORING, ED - Abnormal; Notable for the following:    Glucose-Capillary 31 (*)    All other components within normal limits  ETHANOL  PROTIME-INR  APTT  URINE RAPID DRUG SCREEN, HOSP PERFORMED  URINALYSIS, ROUTINE W REFLEX MICROSCOPIC (NOT AT [facility])  I-STAT TROPOININ, ED    EKG  EKG Interpretation  Date/Time:  Friday February 13 2016 09:04:44 EDT Ventricular Rate:  66 PR Interval:    QRS Duration: 115 QT Interval:  417 QTC Calculation: 437 R Axis:   -47 Text Interpretation:  Sinus rhythm LVH with IVCD, LAD and secondary repol abnrm Artifact in lead(s) I II III aVR aVL aVF No significant change since last tracing Confirmed by Maryan Rued  MD, Loree Fee ([id number]) on 02/13/2016 9:25:42 AM       Radiology Ct Head Wo Contrast  Result Date: 02/13/2016 CLINICAL DATA:   Confusion. EXAM: CT HEAD WITHOUT CONTRAST TECHNIQUE: Contiguous axial images were obtained from the base of the skull through the vertex without intravenous contrast. COMPARISON:  CT scan of June 17, 2014. FINDINGS: Bony calvarium appears intact. Mild diffuse cortical atrophy is noted. Moderate chronic ischemic white matter disease is noted. No mass effect or midline shift is noted. Ventricular size is within normal limits. There is no evidence of mass lesion, hemorrhage or acute infarction. IMPRESSION: Mild diffuse cortical atrophy. Moderate chronic ischemic white matter disease. No acute intracranial abnormality seen. Electronically Signed   By: Marijo Conception, M.D.   On: 02/13/2016 10:21  Mr Breast Bilateral W Wo Contrast  Result Date: 02/12/2016 CLINICAL DATA:  History of new diagnosis of right breast cancer metastatic to right axillary lymph node. The patient is undergoing chemotherapy. Palpable areas of concern are felt in both breasts on physical exam. Patient complains of occasional right breast pain. LABS:  None. EXAM: BILATERAL BREAST MRI WITH AND WITHOUT CONTRAST TECHNIQUE: Multiplanar, multisequence MR images of both breasts were obtained prior to and following the intravenous administration of 15 ml of MultiHance. THREE-DIMENSIONAL MR IMAGE RENDERING ON INDEPENDENT WORKSTATION: Three-dimensional MR images were rendered by post-processing of the original MR data on an independent workstation. The three-dimensional MR images were interpreted, and findings are reported in the following complete MRI report for this study. Three dimensional images were evaluated at the independent Dyna[employer] workstation COMPARISON:  Previous exam(s). FINDINGS: Breast composition: b. Scattered fibroglandular tissue. Background parenchymal enhancement: Mild Right breast: In the right 5 o'clock breast, far posterior depth, there is a necrotic progressively enhancing mass which measures 4.8 x 4.9 x 5.4 cm and corresponds  to the biopsy-proven malignancy. It abuts but does not seem to invade the underlying pectoralis muscle. It however invades the overlying skin inferiorly, causing severe tenting. Similar in enhancing characteristics satellite mass is seen 1.4 cm superior medially to the index lesion measuring 1.3 x 1.4 x 1.5 cm. There is a diffuse skin and trabecular edema within the right breast, most pronounced in the lower inner quadrant of the breast with a skin thickness measures up to  9 mm. Within the edematous skin layer at 3 o'clock, middle depth, there are 2 adjacent suspicious peripherally enhancing intradermal masses which measure 1.2 by 0.9 cm and 0.7 x 0.5 cm. Two similar in appearance intradermal lesions are seen in the right 5 o'clock breast, middle depth, measuring 1.3 x 0.9 and 1.0 by 0.6 cm. These are suspicious for metastatic deposits. At least 7 other smaller few mm intradermal masses are seen within the skin of the lower inner quadrant of the right breast.There is a grossly abnormal low axillary lymph node within the right axilla which corresponds to the biopsy-proven metastatic lymph node measuring 2.4 cm in diameter. Two other smaller but abnormally rounded lymph nodes are seen in the right axilla. Left breast: No mass or abnormal enhancement. Lymph nodes: No abnormal appearing internal mammary chain or left lymph nodes. Ancillary findings:  None. IMPRESSION: Findings consistent with inflammatory right breast carcinoma with the index lesion located at 5 o'clock, posterior depth, measuring 4.7 x 4.7 x 5.7 cm. This lesion invades the skin. 1.4 cm satellite mass located in the same quadrant of the breast. Multiple intradermal metastatic deposits within the lower inner quadrant of the right breast. At least 3 abnormal right axillary lymph nodes. No evidence of internal mammary chain or left axillary lymphadenopathy. RECOMMENDATION: Continue with plan of care. If further exploration of the right breast probably  metastatic dermal nodules is desired, second-look ultrasound may be performed. BI-RADS CATEGORY  4: Suspicious. Electronically Signed   By: Fidela Salisbury M.D.   On: 02/12/2016 15:57   Procedures Procedures (including critical care time)  Medications Ordered in ED Medications - No data to display   Initial Impression / Assessment and Plan / ED Course  I have reviewed the triage vital signs and the nursing notes.  Pertinent labs & imaging results that were available during my care of the patient were reviewed by me and considered in my medical decision making (see chart for details).  Clinical Course   Patient presenting today with new onset of confusion, aphasia, balance difficulty starting sometime during the night. Patient was normal when she went to bed last night but daughter states has not been herself since getting out of bed this morning. She attempted to sit down on the couch and fell off. She was doing things that did not make sent. Daughter states she's had strokes in the past but she's never acted like this before. She does have a history of inflammatory breast cancer and is currently on chemotherapy but as far as the daughter knows she's never had any metastases to the brain. She did not display any seizure-like activity today and she has been awake and alert however she is unable to recall who the president is and has difficulty cooperating with elements of the exam such as heel to shin testing and visual field testing. She is in no acute distress at this time. Stroke order set initiated as it is my main concern but also will ensure patient is not having head bleed, metastases or infection. Breast wound is improving per daughter  11:27 AM Patient was found to be hypoglycemic in the 40s and 30s which may be the cause of all of her symptoms. She was given D50 and will monitor for improvement. CT without findings for acute issues. UA pending  12:19 PM UA wnl.  Patient is now  much improved. Mental status is normal.  Patient is now eating a sandwich and doing very well. Ambulates without difficulty. Recommended  stopping all oral diabetic meds and following her sugar for the next 1-2 days to ensure that she has no more hypoglycemic episodes. Recommended starting back glipizide. They will follow-up with her PCP. Final Clinical Impressions(s) / ED Diagnoses   Final diagnoses:  Hypoglycemia    New Prescriptions New Prescriptions   BLOOD GLUCOSE MONITORING SUPPL (ACCU-CHEK AVIVA PLUS) W/DEVICE KIT    1 Device by Does not apply route 2 (two) times daily.   GLUCOSE BLOOD (ACCU-CHEK AVIVA) TEST STRIP    Use as instructed   LANCETS (ACCU-CHEK SOFT TOUCH) LANCETS    Use as instructed     Blanchie Dessert, MD 02/13/16 1221    Blanchie Dessert, MD 02/13/16 1221    Blanchie Dessert, MD 02/13/16 1316

## 2016-02-13 NOTE — ED Triage Notes (Signed)
Per pts daughter pt woke up at 0730 and was incoherent,   She had fallen and was confused  Not as verbal , new dx of rt  inflammatory breast cancer x 1 month,  Pt states she is weak

## 2016-02-16 ENCOUNTER — Ambulatory Visit: Payer: Medicare Other | Admitting: Oncology

## 2016-02-16 ENCOUNTER — Telehealth: Payer: Self-pay | Admitting: Pharmacist

## 2016-02-16 ENCOUNTER — Other Ambulatory Visit: Payer: Medicare Other

## 2016-02-16 NOTE — Telephone Encounter (Signed)
Oral Chemotherapy Pharmacist Encounter  Date 02/16/16: Attempted to reach patient's daughter for follow up on oral medication: Xeloda and to reach out about ED visit on 02/13/16 for hypoglycemia. Noted patient doing extremely well on 7/24 OV with Dr. Jana Hakim. To start cycle 2 on 7/25. No answer. Left VM for patient's daughter to call back with any questions or issues.   Thank you,  Johny Drilling, PharmD, [facility] [phone]

## 2016-02-16 NOTE — Telephone Encounter (Signed)
Oral Chemotherapy Pharmacist Encounter  Pt's daughter returned call from earlier today about toleration of cycle 2 of Xeloda. VM left on oral chemo clinic phone. Pt's daughter reports that patient has had no side effects from this current cycle (Cycle #2) which started 7/25 and will end today. Pt will start week off tomorrow (8/1).  Pt has missed 0 pills this past week. Daughter also stated that patient is doing fine since ED visit.  Oral Chemo Clinic will follow-up labs on 8/7 and MD visit on 8/16 for any necessary toxicity and compliance management.  Johny Drilling, PharmD, BCPS [facility]

## 2016-02-18 ENCOUNTER — Encounter: Payer: Self-pay | Admitting: Cardiology

## 2016-02-18 NOTE — Progress Notes (Signed)
Cardiology Office Note   Date:  02/20/2016   ID:  Jennifer Garrett, DOB August 16, 1931, MRN [id number]  PCP:  Philis Fendt, MD  Cardiologist:   Minus Breeding, MD   Chief Complaint  Patient presents with  . Coronary Artery Disease      History of Present Illness: Jennifer Garrett is a 82 y.o. female who presents for preoperative evaluation prior to breast surgery for newly diagnosed cancer.   She has a cardiac history but has not been seen in years. I was able to find a discharge summary from 2003 that demonstrated a cardiac cath with 80% small right coronary artery narrowing, 20-30% hazy proximal LAD stenosis and a 90% OM 3 stenosis. This was treated with a Cypher stent.   The patient denies any new symptoms such as chest discomfort, neck or arm discomfort. There has been no new shortness of breath, PND or orthopnea. There have been no reported palpitations, presyncope or syncope.  She does do some grocery shopping but she has a low functional level.   Past Medical History:  Diagnosis Date  . Arthritis   . Asthma    rarely uses neb  . Breast cancer of lower-inner quadrant of right female breast ([place]) 01/22/2016  . Carpal tunnel syndrome   . Coronary artery disease    DES to OM3 90% stenosis 2003, 90% small RCA stenosis.    . Diabetes mellitus   . Hyperlipemia   . Hypertension     Past Surgical History:  Procedure Laterality Date  . ABDOMINAL HYSTERECTOMY    . CARDIAC CATHETERIZATION  2003   stent-  . CARPAL TUNNEL RELEASE  03/29/2012   Procedure: CARPAL TUNNEL RELEASE;  Surgeon: Wynonia Sours, MD;  Location: [place];  Service: Orthopedics;  Laterality: Left;  . SHOULDER ARTHROSCOPY     right and left  . TRIGGER FINGER RELEASE  03/29/2012   Procedure: RELEASE TRIGGER FINGER/A-1 PULLEY;  Surgeon: Wynonia Sours, MD;  Location: [place];  Service: Orthopedics;  Laterality: Left;     Current Outpatient Prescriptions  Medication Sig Dispense Refill  .  albuterol (PROVENTIL HFA;VENTOLIN HFA) 108 (90 BASE) MCG/ACT inhaler Inhale 1 puff into the lungs every 6 (six) hours as needed for wheezing or shortness of breath. Reported on 02/04/2016    . aspirin EC 81 MG tablet Take 1 tablet by mouth every morning.    . Blood Glucose Monitoring Suppl (ACCU-CHEK AVIVA PLUS) w/Device KIT 1 Device by Does not apply route 2 (two) times daily. 1 kit 0  . calcium carbonate (OS-CAL) 600 MG TABS Take 600 mg by mouth 2 (two) times daily with a meal.    . diclofenac sodium (VOLTAREN) 1 % GEL Apply 1 application topically every other day.    Marland Kitchen glipiZIDE (GLUCOTROL) 10 MG tablet Take 10 mg by mouth 2 (two) times daily before a meal.    . glucose blood (ACCU-CHEK AVIVA) test strip Use as instructed 100 each 12  . hydrochlorothiazide (HYDRODIURIL) 25 MG tablet Take 25 mg by mouth daily.    Marland Kitchen KLOR-CON M20 20 MEQ tablet Take 20 mEq by mouth daily.    . Lancets (ACCU-CHEK SOFT TOUCH) lancets Use as instructed 100 each 12  . olmesartan (BENICAR) 40 MG tablet Take 40 mg by mouth daily.    . sitaGLIPtin (JANUVIA) 100 MG tablet Take 100 mg by mouth daily.    Marland Kitchen atorvastatin (LIPITOR) 40 MG tablet Take 1 tablet (40 mg total)  by mouth daily. 90 tablet 3   No current facility-administered medications for this visit.     Allergies:   Lisinopril; Lyrica [pregabalin]; Feldene [piroxicam]; Orudis [ketoprofen]; and Vibramycin [doxycycline calcium]    Social History:  The patient  reports that she quit smoking about 37 years ago. She has never used smokeless tobacco. She reports that she does not drink alcohol or use drugs.   Family History:  The patient's family history includes Heart disease in her mother.  (No details)   ROS:  Please see the history of present illness.   Otherwise, review of systems are positive for none.   All other systems are reviewed and negative.    PHYSICAL EXAM: VS:  BP 138/64 (BP Location: Right Arm, Patient Position: Sitting, Cuff Size: Normal)    Pulse (!) 59   Ht '5\' 2"'  (1.575 m)   Wt 164 lb 9.6 oz (74.7 kg)   BMI 30.11 kg/m  , BMI Body mass index is 30.11 kg/m. GENERAL:  Well appearing HEENT:  Pupils equal round and reactive, fundi not visualized, oral mucosa unremarkable, edentulous NECK:  No jugular venous distention, waveform within normal limits, carotid upstroke brisk and symmetric, no bruits, no thyromegaly LYMPHATICS:  No cervical, inguinal adenopathy LUNGS:  Clear to auscultation bilaterally BACK:  No CVA tenderness CHEST:  Unremarkable, open draining right breast wound HEART:  PMI not displaced or sustained,S1 and S2 within normal limits, no S3, no S4, no clicks, no rubs, no murmurs ABD:  Flat, positive bowel sounds normal in frequency in pitch, no bruits, no rebound, no guarding, no midline pulsatile mass, no hepatomegaly, no splenomegaly EXT:  2 plus pulses throughout, no edema, no cyanosis no clubbing SKIN:  No rashes no nodules NEURO:  Cranial nerves II through XII grossly intact, motor grossly intact throughout PSYCH:  Cognitively intact, oriented to person place and time    EKG:  EKG is not ordered today. The ekg ordered 7/28/17demonstrates sinus rhythm, rate 66, left axis deviation, left ventricular hypertrophy, poor anterior R wave progression, nonspecific lateral T-wave changes.    Recent Labs: 02/13/2016: ALT 14; BUN 18; Creatinine, Ser 1.19; Hemoglobin 11.4; Platelets 287; Potassium 3.4; Sodium 139    Lipid Panel No results found for: CHOL, TRIG, HDL, CHOLHDL, VLDL, LDLCALC, LDLDIRECT    Wt Readings from Last 3 Encounters:  02/20/16 164 lb 9.6 oz (74.7 kg)  02/09/16 164 lb (74.4 kg)  02/04/16 165 lb 11.2 oz (75.2 kg)      Other studies Reviewed: Additional studies/ records that were reviewed today include: Old records as above. Review of the above records demonstrates:  Please see elsewhere in the note.     ASSESSMENT AND PLAN:  PREOP:  The patient has a low functional level. She has known  coronary disease.  Screening with exercise treadmill testing would be indicated with the patient would be a little walk. Therefore, she will have a [employer].  CAD:  She needs aggressive risk reduction.  HTN:  Her blood pressure is well controlled and she'll continue the meds as listed.  DYSLIPIDEMIA: I was able to review records last LDL last year was 158. Anemia. Her Zocor and start Lipitor 40 mg daily with goal LDL of 70.    DM:    She reports hypoglycemia recently. I will defer to  Philis Fendt, MD  Current medicines are reviewed at length with the patient today.  The patient does not have concerns regarding medicines.  The following changes have been  made:  As above  Labs/ tests ordered today include:   Orders Placed This Encounter  Procedures  . Myocardial Perfusion Imaging     Disposition:   FU with me in 12 months.     Signed, Minus Breeding, MD  02/20/2016 10:56 AM    [facility]

## 2016-02-20 ENCOUNTER — Encounter: Payer: Self-pay | Admitting: Cardiology

## 2016-02-20 ENCOUNTER — Ambulatory Visit (INDEPENDENT_AMBULATORY_CARE_PROVIDER_SITE_OTHER): Payer: Medicare Other | Admitting: Cardiology

## 2016-02-20 ENCOUNTER — Encounter (INDEPENDENT_AMBULATORY_CARE_PROVIDER_SITE_OTHER): Payer: Self-pay

## 2016-02-20 VITALS — BP 138/64 | HR 59 | Ht 62.0 in | Wt 164.6 lb

## 2016-02-20 DIAGNOSIS — I2510 Atherosclerotic heart disease of native coronary artery without angina pectoris: Secondary | ICD-10-CM | POA: Diagnosis not present

## 2016-02-20 DIAGNOSIS — Z01810 Encounter for preprocedural cardiovascular examination: Secondary | ICD-10-CM

## 2016-02-20 MED ORDER — ATORVASTATIN CALCIUM 40 MG PO TABS: 40.0000 mg | ORAL_TABLET | Freq: Every day | ORAL | 3 refills | Status: DC

## 2016-02-20 NOTE — Patient Instructions (Addendum)
Medication Instructions:  STOP Simvastatin and START Atorvastatin 40 mg daily  Labwork: NONE  Testing/Procedures: Your physician has requested that you have a lexiscan myoview. For further information please visit [email or web address]. Please follow instruction sheet, as given.  Follow-Up: Your physician wants you to follow-up in: 1 Year. You will receive a reminder letter in the mail two months in advance. If you don't receive a letter, please call our office to schedule the follow-up appointment.   Any Other Special Instructions Will Be Listed Below (If Applicable).   If you need a refill on your cardiac medications before your next appointment, please call your pharmacy.

## 2016-02-23 ENCOUNTER — Other Ambulatory Visit (HOSPITAL_BASED_OUTPATIENT_CLINIC_OR_DEPARTMENT_OTHER): Payer: Medicare Other

## 2016-02-23 DIAGNOSIS — C50311 Malignant neoplasm of lower-inner quadrant of right female breast: Secondary | ICD-10-CM | POA: Diagnosis not present

## 2016-02-23 LAB — COMPREHENSIVE METABOLIC PANEL
ALT: 12 U/L (ref 0–55)
ANION GAP: 10 meq/L (ref 3–11)
AST: 23 U/L (ref 5–34)
Albumin: 3.6 g/dL (ref 3.5–5.0)
Alkaline Phosphatase: 65 U/L (ref 40–150)
BILIRUBIN TOTAL: 0.31 mg/dL (ref 0.20–1.20)
BUN: 17 mg/dL (ref 7.0–26.0)
CALCIUM: 10.4 mg/dL (ref 8.4–10.4)
CO2: 25 mEq/L (ref 22–29)
CREATININE: 1.3 mg/dL — AB (ref 0.6–1.1)
Chloride: 107 mEq/L (ref 98–109)
EGFR: 46 mL/min/{1.73_m2} — ABNORMAL LOW (ref >90)
Glucose: 213 mg/dl — ABNORMAL HIGH (ref 70–140)
Potassium: 4.8 mEq/L (ref 3.5–5.1)
Sodium: 142 mEq/L (ref 136–145)
TOTAL PROTEIN: 8 g/dL (ref 6.4–8.3)

## 2016-02-23 LAB — CBC WITH DIFFERENTIAL/PLATELET
BASO%: 0.9 % (ref 0.0–2.0)
Basophils Absolute: 0.1 10*3/uL (ref 0.0–0.1)
EOS%: 3.6 % (ref 0.0–7.0)
Eosinophils Absolute: 0.3 10*3/uL (ref 0.0–0.5)
HEMATOCRIT: 36.2 % (ref 34.8–46.6)
HGB: 11.8 g/dL (ref 11.6–15.9)
LYMPH#: 3.1 10*3/uL (ref 0.9–3.3)
LYMPH%: 37.4 % (ref 14.0–49.7)
MCH: 27.8 pg (ref 25.1–34.0)
MCHC: 32.5 g/dL (ref 31.5–36.0)
MCV: 85.3 fL (ref 79.5–101.0)
MONO#: 1 10*3/uL — AB (ref 0.1–0.9)
MONO%: 11.8 % (ref 0.0–14.0)
NEUT%: 46.3 % (ref 38.4–76.8)
NEUTROS ABS: 3.8 10*3/uL (ref 1.5–6.5)
PLATELETS: 291 10*3/uL (ref 145–400)
RBC: 4.24 10*6/uL (ref 3.70–5.45)
RDW: 14.1 % (ref 11.2–14.5)
WBC: 8.2 10*3/uL (ref 3.9–10.3)

## 2016-02-24 MED FILL — CAPECITABINE 500 MG TABLET: 500 | 7 days supply | Qty: 42 | Fill #2

## 2016-02-25 ENCOUNTER — Telehealth (HOSPITAL_COMMUNITY): Payer: Self-pay

## 2016-02-25 ENCOUNTER — Other Ambulatory Visit: Payer: Self-pay | Admitting: *Deleted

## 2016-02-25 MED ORDER — METRONIDAZOLE 500 MG PO TABS: ORAL_TABLET | ORAL | 3 refills | Status: DC

## 2016-02-25 NOTE — Telephone Encounter (Signed)
Encounter complete. 

## 2016-02-27 ENCOUNTER — Ambulatory Visit (HOSPITAL_COMMUNITY)
Admission: RE | Admit: 2016-02-27 | Discharge: 2016-02-27 | Disposition: A | Discharge status: Home / Self Care | Payer: Medicare Other | Source: Ambulatory Visit | Attending: Cardiovascular Disease | Admitting: Cardiovascular Disease

## 2016-02-27 DIAGNOSIS — I10 Essential (primary) hypertension: Secondary | ICD-10-CM | POA: Insufficient documentation

## 2016-02-27 DIAGNOSIS — R9439 Abnormal result of other cardiovascular function study: Secondary | ICD-10-CM | POA: Diagnosis not present

## 2016-02-27 DIAGNOSIS — I2510 Atherosclerotic heart disease of native coronary artery without angina pectoris: Secondary | ICD-10-CM | POA: Diagnosis present

## 2016-02-27 DIAGNOSIS — Z87891 Personal history of nicotine dependence: Secondary | ICD-10-CM | POA: Diagnosis not present

## 2016-02-27 DIAGNOSIS — E119 Type 2 diabetes mellitus without complications: Secondary | ICD-10-CM | POA: Insufficient documentation

## 2016-02-27 DIAGNOSIS — R0602 Shortness of breath: Secondary | ICD-10-CM | POA: Diagnosis not present

## 2016-02-27 DIAGNOSIS — Z8249 Family history of ischemic heart disease and other diseases of the circulatory system: Secondary | ICD-10-CM | POA: Diagnosis not present

## 2016-02-27 LAB — MYOCARDIAL PERFUSION IMAGING
CHL CUP NUCLEAR SRS: 3
CHL CUP NUCLEAR SSS: 6
LVDIAVOL: 86 mL (ref 46–106)
LVSYSVOL: 40 mL
NUC STRESS TID: 1.09
Peak HR: 82 {beats}/min
Rest HR: 59 {beats}/min
SDS: 4

## 2016-02-27 MED ORDER — REGADENOSON 0.4 MG/5ML IV SOLN
0.4000 mg | Freq: Once | INTRAVENOUS | Status: AC
  Administered 2016-02-27: 0.4 mg via INTRAVENOUS

## 2016-02-27 MED ORDER — TECHNETIUM TC 99M TETROFOSMIN IV KIT
10.8000 | PACK | Freq: Once | INTRAVENOUS | Status: AC | PRN
  Administered 2016-02-27: 11 via INTRAVENOUS
  Filled 2016-02-27: qty 11

## 2016-02-27 MED ORDER — TECHNETIUM TC 99M TETROFOSMIN IV KIT
31.4000 | PACK | Freq: Once | INTRAVENOUS | Status: AC | PRN
  Administered 2016-02-27: 31 via INTRAVENOUS
  Filled 2016-02-27: qty 31

## 2016-03-03 ENCOUNTER — Telehealth: Payer: Self-pay | Admitting: Oncology

## 2016-03-03 ENCOUNTER — Other Ambulatory Visit (HOSPITAL_BASED_OUTPATIENT_CLINIC_OR_DEPARTMENT_OTHER): Payer: Medicare Other

## 2016-03-03 ENCOUNTER — Ambulatory Visit (HOSPITAL_BASED_OUTPATIENT_CLINIC_OR_DEPARTMENT_OTHER): Payer: Medicare Other | Admitting: Oncology

## 2016-03-03 VITALS — BP 159/61 | HR 53 | Temp 98.9°F | Resp 18 | Ht 62.0 in | Wt 162.2 lb

## 2016-03-03 DIAGNOSIS — C50311 Malignant neoplasm of lower-inner quadrant of right female breast: Secondary | ICD-10-CM

## 2016-03-03 DIAGNOSIS — Z171 Estrogen receptor negative status [ER-]: Secondary | ICD-10-CM | POA: Diagnosis not present

## 2016-03-03 DIAGNOSIS — C773 Secondary and unspecified malignant neoplasm of axilla and upper limb lymph nodes: Secondary | ICD-10-CM | POA: Diagnosis not present

## 2016-03-03 LAB — CBC WITH DIFFERENTIAL/PLATELET
BASO%: 0.8 % (ref 0.0–2.0)
BASOS ABS: 0.1 10*3/uL (ref 0.0–0.1)
EOS ABS: 0.2 10*3/uL (ref 0.0–0.5)
EOS%: 3.4 % (ref 0.0–7.0)
HEMATOCRIT: 33.4 % — AB (ref 34.8–46.6)
HEMOGLOBIN: 10.9 g/dL — AB (ref 11.6–15.9)
LYMPH#: 2.5 10*3/uL (ref 0.9–3.3)
LYMPH%: 40.2 % (ref 14.0–49.7)
MCH: 27.9 pg (ref 25.1–34.0)
MCHC: 32.6 g/dL (ref 31.5–36.0)
MCV: 85.6 fL (ref 79.5–101.0)
MONO#: 0.6 10*3/uL (ref 0.1–0.9)
MONO%: 10.4 % (ref 0.0–14.0)
NEUT%: 45.2 % (ref 38.4–76.8)
NEUTROS ABS: 2.8 10*3/uL (ref 1.5–6.5)
Platelets: 211 10*3/uL (ref 145–400)
RBC: 3.9 10*6/uL (ref 3.70–5.45)
RDW: 14 % (ref 11.2–14.5)
WBC: 6.2 10*3/uL (ref 3.9–10.3)

## 2016-03-03 LAB — COMPREHENSIVE METABOLIC PANEL
ALBUMIN: 3.4 g/dL — AB (ref 3.5–5.0)
ALT: 12 U/L (ref 0–55)
AST: 30 U/L (ref 5–34)
Alkaline Phosphatase: 54 U/L (ref 40–150)
Anion Gap: 9 mEq/L (ref 3–11)
BILIRUBIN TOTAL: 0.49 mg/dL (ref 0.20–1.20)
BUN: 17.7 mg/dL (ref 7.0–26.0)
CALCIUM: 10.1 mg/dL (ref 8.4–10.4)
CHLORIDE: 106 meq/L (ref 98–109)
CO2: 26 mEq/L (ref 22–29)
CREATININE: 1.1 mg/dL (ref 0.6–1.1)
EGFR: 53 mL/min/{1.73_m2} — ABNORMAL LOW (ref >90)
Glucose: 134 mg/dl (ref 70–140)
Potassium: 4.8 mEq/L (ref 3.5–5.1)
Sodium: 141 mEq/L (ref 136–145)
TOTAL PROTEIN: 7.5 g/dL (ref 6.4–8.3)

## 2016-03-03 NOTE — Progress Notes (Signed)
[place]  [phone] [phone]     ID: Jennifer Garrett DOB: 10/27/1931  MR#: [id number]  LKT#:[id number]  Patient Care Team: Nolene Ebbs, MD as PCP - General (Internal Medicine) Fanny Skates, MD as Consulting Physician (General Surgery) Chauncey Cruel, MD as Consulting Physician (Oncology) Minus Breeding, MD as Consulting Physician (Cardiology) PCP: Philis Fendt, MD Chauncey Cruel, MD GYN: SU:  OTHER MD:  CHIEF COMPLAINT: Locally advanced triple negative breast cancer  CURRENT TREATMENT:capecitabine 1500 mg BID 7 days on then 7 days off. Started 01/27/2016.   BREAST CANCER HISTORY: From the original intake note:  The patient presented to the emergency room 11/20/2015 with a complaint of a mass in the lower aspect of her right breast, which was painful and bleeding. The patient stated the mass had been present approximately a month. An ultrasound of the breast was obtained which found a 4.4 cm mass in the right breast at the 6:00 position. This was read as most consistent with an abscess. The patient was started on clindamycin and referred to the [facility], where on 01/05/2016 she underwent bilateral diagnostic mammography with tomography and right  ultrasonography.   Mammography showed far posteriorly in the 5:00 region of the right breast a 4.1 cm mass with enlarged axillary adenopathy. On exam there was a fungating ulcerated bleeding mass in the right breast at the 5:00 position 7 cm from the nipple. The right axilla was "full" on palpation. Ultrasound showed a 5.4 cm mass at the 5:00 position of the right breast 7 cm from the nipple, with at least 3 enlarged abnormal axillary lymph nodes, the largest measuring 3.7 cm.  Biopsy of the right breast mass and one axillary lymph node on 01/06/2016 showed ([id number]) both to be positive for an invasive ductal carcinoma, grade 3, triple negative, with the HER-2 signals ratio between  1.21-1.40 and the number per cell 2.85-3.00. The MIB-1-2 was 70%.  The patient's subsequent history is as detailed below  INTERVAL HISTORY: Jennifer Garrett returns today for follow-up of her triple negative breast cancer accompanied by her daughter. Today is day 0 cycle 3 of capecitabine which the patient is tolerating well and will resume tomorrow. Specifically she denies mouth sores, diarrhea, or plantar palmar erythrodysesthesia. She has some breast discomfort which is more like a shooting pain that occurs occasionally been a continual soreness. She has a good appetite. She is asking her daughter to cope daily. She is doing the usual house hold activities that she enjoys.   Since her last visit here she had her stress test, 02/27/2016, read as low risk, with a fixed defect. She would be at acceptable risk for the planned surgery.   REVIEW OF SYSTEMS: Jennifer Garrett tells me her blood sugars are "okay". She is trying to stay away from sodas. I asked what her worst problem is as side from the mass in her right breast, and she cannot name a worst problem. A detailed review of systems today was otherwise noncontributory       PAST MEDICAL HISTORY: Past Medical History:  Diagnosis Date  . Arthritis   . Asthma    rarely uses neb  . Breast cancer of lower-inner quadrant of right female breast ([place]) 01/22/2016  . Carpal tunnel syndrome   . Coronary artery disease    DES to OM3 90% stenosis 2003, 90% small RCA stenosis.    . Diabetes mellitus   . Hyperlipemia   . Hypertension  PAST SURGICAL HISTORY: Past Surgical History:  Procedure Laterality Date  . ABDOMINAL HYSTERECTOMY    . CARDIAC CATHETERIZATION  2003   stent-  . CARPAL TUNNEL RELEASE  03/29/2012   Procedure: CARPAL TUNNEL RELEASE;  Surgeon: Wynonia Sours, MD;  Location: [facility];  Service: Orthopedics;  Laterality: Left;  . SHOULDER ARTHROSCOPY     right and left  . TRIGGER FINGER RELEASE  03/29/2012   Procedure: RELEASE TRIGGER  FINGER/A-1 PULLEY;  Surgeon: Wynonia Sours, MD;  Location: [facility];  Service: Orthopedics;  Laterality: Left;    FAMILY HISTORY Family History  Problem Relation Age of Onset  . Heart disease Mother     No details   The patient's father died from "old age" at age 23. The patient's mother died at age 36 from complications of high blood pressure and diabetes. The patient had one brother, 4 sisters. There is no cancer in the family to her knowledge   GYNECOLOGIC HISTORY:  No LMP recorded. Patient has had a hysterectomy.  menarche age 89, first live birth age 81. The patient is GX P2. She underwent abdominal hysterectomy with bilateral salpingo-oophorectomy in her 72s. She did not undergo hormone replacement  SOCIAL HISTORY:  Jennifer Garrett used to work for [employer]. She lives with her daughter Jennifer Garrett, who is Medical sales representative for [employer]. The patient's son Jennifer Garrett lives in [place]. The patient has 1 grandchild, no great-grandchildren. She attends a local [employer] DIRECTIVES:  not in place. At the 01/22/2016 visit the patient was given the appropriate documents to complete. She tells me she intends to name her daughter Jennifer Garrett as her healthcare part of attorney    HEALTH MAINTENANCE: Social History  Substance Use Topics  . Smoking status: Former Smoker    Quit date: 03/24/1978  . Smokeless tobacco: Never Used  . Alcohol use No     Colonoscopy: Never   PAP: status post hysterectomy   Bone density: Never    Allergies  Allergen Reactions  . Lisinopril Other (See Comments)    States that it makes her "sensitive to light"  . Lyrica [Pregabalin] Swelling  . Feldene [Piroxicam] Rash  . Orudis [Ketoprofen] Rash  . Vibramycin [Doxycycline Calcium] Rash and Hives    Current Outpatient Prescriptions  Medication Sig Dispense Refill  . albuterol (PROVENTIL HFA;VENTOLIN HFA) 108 (90 BASE) MCG/ACT inhaler Inhale 1 puff into the  lungs every 6 (six) hours as needed for wheezing or shortness of breath. Reported on 02/04/2016    . aspirin EC 81 MG tablet Take 1 tablet by mouth every morning.    Marland Kitchen atorvastatin (LIPITOR) 40 MG tablet Take 1 tablet (40 mg total) by mouth daily. 90 tablet 3  . Blood Glucose Monitoring Suppl (ACCU-CHEK AVIVA PLUS) w/Device KIT 1 Device by Does not apply route 2 (two) times daily. 1 kit 0  . calcium carbonate (OS-CAL) 600 MG TABS Take 600 mg by mouth 2 (two) times daily with a meal.    . diclofenac sodium (VOLTAREN) 1 % GEL Apply 1 application topically every other day.    Marland Kitchen glipiZIDE (GLUCOTROL) 10 MG tablet Take 10 mg by mouth 2 (two) times daily before a meal.    . glucose blood (ACCU-CHEK AVIVA) test strip Use as instructed 100 each 12  . hydrochlorothiazide (HYDRODIURIL) 25 MG tablet Take 25 mg by mouth daily.    Marland Kitchen KLOR-CON M20 20 MEQ tablet Take 20  mEq by mouth daily.    . Lancets (ACCU-CHEK SOFT TOUCH) lancets Use as instructed 100 each 12  . metroNIDAZOLE (FLAGYL) 500 MG tablet Crush 1 tablet daily to use on open wound 30 tablet 3  . olmesartan (BENICAR) 40 MG tablet Take 40 mg by mouth daily.    . sitaGLIPtin (JANUVIA) 100 MG tablet Take 100 mg by mouth daily.     No current facility-administered medications for this visit.     OBJECTIVE: Older African-American woman who appears stated age 49:   03/03/16 0932  BP: (!) 159/61  Pulse: (!) 53  Resp: 18  Temp: 98.9 F (37.2 C)     Body mass index is 29.67 kg/m.    ECOG FS:1 - Symptomatic but completely ambulatory  Sclerae unicteric, EOMs intact Oropharynx clear, slightly dry No cervical or supraclavicular adenopathy Lungs no rales or rhonchi Heart regular rate and rhythm Abd soft, nontender, positive bowel sounds MSK no focal spinal tenderness, no upper extremity lymphedema Neuro: nonfocal, well oriented, appropriate affect Breasts: The right breast is imaged below. In addition to the mass in the inferior aspect of the  breast, there are 2 nodules on the superior aspect of the breast. The right axilla is benign. Left breast is unremarkable.    Right breast 03/05/2016     01/22/2016    LAB RESULTS:  CMP     Component Value Date/Time   NA 141 03/03/2016 0832   K 4.8 03/03/2016 0832   CL 103 02/13/2016 0939   CO2 26 03/03/2016 0832   GLUCOSE 134 03/03/2016 0832   BUN 17.7 03/03/2016 0832   CREATININE 1.1 03/03/2016 0832   CALCIUM 10.1 03/03/2016 0832   PROT 7.5 03/03/2016 0832   ALBUMIN 3.4 (L) 03/03/2016 0832   AST 30 03/03/2016 0832   ALT 12 03/03/2016 0832   ALKPHOS 54 03/03/2016 0832   BILITOT 0.49 03/03/2016 0832   GFRNONAA 42 (L) 02/13/2016 0939   GFRAA 48 (L) 02/13/2016 0939    INo results found for: SPEP, UPEP  Lab Results  Component Value Date   WBC 6.2 03/03/2016   NEUTROABS 2.8 03/03/2016   HGB 10.9 (L) 03/03/2016   HCT 33.4 (L) 03/03/2016   MCV 85.6 03/03/2016   PLT 211 03/03/2016      Chemistry      Component Value Date/Time   NA 141 03/03/2016 0832   K 4.8 03/03/2016 0832   CL 103 02/13/2016 0939   CO2 26 03/03/2016 0832   BUN 17.7 03/03/2016 0832   CREATININE 1.1 03/03/2016 0832      Component Value Date/Time   CALCIUM 10.1 03/03/2016 0832   ALKPHOS 54 03/03/2016 0832   AST 30 03/03/2016 0832   ALT 12 03/03/2016 0832   BILITOT 0.49 03/03/2016 0832       No results found for: LABCA2  No components found for: LABCA125  No results for input(s): INR in the last 168 hours.  Urinalysis    Component Value Date/Time   COLORURINE YELLOW 02/13/2016 1122   APPEARANCEUR CLEAR 02/13/2016 1122   LABSPEC 1.016 02/13/2016 1122   PHURINE 5.5 02/13/2016 1122   GLUCOSEU NEGATIVE 02/13/2016 1122   HGBUR NEGATIVE 02/13/2016 1122   BILIRUBINUR NEGATIVE 02/13/2016 1122   KETONESUR NEGATIVE 02/13/2016 1122   PROTEINUR NEGATIVE 02/13/2016 1122   UROBILINOGEN 1.0 11/28/2013 0143   NITRITE NEGATIVE 02/13/2016 1122   LEUKOCYTESUR NEGATIVE 02/13/2016 1122      STUDIES: Ct Head Wo Contrast  Result Date: 02/13/2016 CLINICAL DATA:  Confusion. EXAM: CT HEAD WITHOUT CONTRAST TECHNIQUE: Contiguous axial images were obtained from the base of the skull through the vertex without intravenous contrast. COMPARISON:  CT scan of June 17, 2014. FINDINGS: Bony calvarium appears intact. Mild diffuse cortical atrophy is noted. Moderate chronic ischemic white matter disease is noted. No mass effect or midline shift is noted. Ventricular size is within normal limits. There is no evidence of mass lesion, hemorrhage or acute infarction. IMPRESSION: Mild diffuse cortical atrophy. Moderate chronic ischemic white matter disease. No acute intracranial abnormality seen. Electronically Signed   By: Marijo Conception, M.D.   On: 02/13/2016 10:21  Mr Breast Bilateral W Wo Contrast  Result Date: 02/12/2016 CLINICAL DATA:  History of new diagnosis of right breast cancer metastatic to right axillary lymph node. The patient is undergoing chemotherapy. Palpable areas of concern are felt in both breasts on physical exam. Patient complains of occasional right breast pain. LABS:  None. EXAM: BILATERAL BREAST MRI WITH AND WITHOUT CONTRAST TECHNIQUE: Multiplanar, multisequence MR images of both breasts were obtained prior to and following the intravenous administration of 15 ml of MultiHance. THREE-DIMENSIONAL MR IMAGE RENDERING ON INDEPENDENT WORKSTATION: Three-dimensional MR images were rendered by post-processing of the original MR data on an independent workstation. The three-dimensional MR images were interpreted, and findings are reported in the following complete MRI report for this study. Three dimensional images were evaluated at the independent Dyna[employer] workstation COMPARISON:  Previous exam(s). FINDINGS: Breast composition: b. Scattered fibroglandular tissue. Background parenchymal enhancement: Mild Right breast: In the right 5 o'clock breast, far posterior depth, there is a necrotic  progressively enhancing mass which measures 4.8 x 4.9 x 5.4 cm and corresponds to the biopsy-proven malignancy. It abuts but does not seem to invade the underlying pectoralis muscle. It however invades the overlying skin inferiorly, causing severe tenting. Similar in enhancing characteristics satellite mass is seen 1.4 cm superior medially to the index lesion measuring 1.3 x 1.4 x 1.5 cm. There is a diffuse skin and trabecular edema within the right breast, most pronounced in the lower inner quadrant of the breast with a skin thickness measures up to 9 mm. Within the edematous skin layer at 3 o'clock, middle depth, there are 2 adjacent suspicious peripherally enhancing intradermal masses which measure 1.2 by 0.9 cm and 0.7 x 0.5 cm. Two similar in appearance intradermal lesions are seen in the right 5 o'clock breast, middle depth, measuring 1.3 x 0.9 and 1.0 by 0.6 cm. These are suspicious for metastatic deposits. At least 7 other smaller few mm intradermal masses are seen within the skin of the lower inner quadrant of the right breast.There is a grossly abnormal low axillary lymph node within the right axilla which corresponds to the biopsy-proven metastatic lymph node measuring 2.4 cm in diameter. Two other smaller but abnormally rounded lymph nodes are seen in the right axilla. Left breast: No mass or abnormal enhancement. Lymph nodes: No abnormal appearing internal mammary chain or left lymph nodes. Ancillary findings:  None. IMPRESSION: Findings consistent with inflammatory right breast carcinoma with the index lesion located at 5 o'clock, posterior depth, measuring 4.7 x 4.7 x 5.7 cm. This lesion invades the skin. 1.4 cm satellite mass located in the same quadrant of the breast. Multiple intradermal metastatic deposits within the lower inner quadrant of the right breast. At least 3 abnormal right axillary lymph nodes. No evidence of internal mammary chain or left axillary lymphadenopathy. RECOMMENDATION:  Continue with plan of care. If further exploration of the right  breast probably metastatic dermal nodules is desired, second-look ultrasound may be performed. BI-RADS CATEGORY  4: Suspicious. Electronically Signed   By: Fidela Salisbury M.D.   On: 02/12/2016 15:57   ELIGIBLE FOR AVAILABLE RESEARCH PROTOCOL: no   ASSESSMENT: 82 y.o. [place] woman status post right breast lower outer quadrant and right axillary lymph node biopsy 01/06/2016 both positive for a clinical T4 N1, stage IIIB invasive ductal carcinoma, grade 3, triple negative, with an MIB-1 of 70%.  (a) CT scan of the chest and bone scan 01/27/2016 showed no evidence of metastatic disease.   (1) neoadjuvant chemotherapy consisting of capecitabine, 1500 mg twice daily, 7 days on 7 days off, starting 01/27/2016  (2) right modified radical mastectomy to follow   (3) adjuvant radiation to follow surgery  PLAN: Kabella is doing fine in terms of treatment tolerance. She essentially has no side effects on the capecitabine. That is favorable.  It is difficult for me to tell whether we are making progress. The lesion in the inferior aspect of the right breast is imaged above. It may be becoming more necrotic. It is no longer bleeding. I think we need at least a couple of more cycles of treatment before we can say with any certainty.  It is good news that her stress test was low risk. This means we will be able to proceed to surgery once we determine that these chemotherapy is really not working or when we obtained a maximum benefit from  Neaveh is going to resume her capecitabine for cycle 3, and then see me again in 2 weeks. She knows to call for any problems that may develop before that visit.  Chauncey Cruel, MD   03/03/2016 9:51 AM Medical Oncology and Hematology [facility] [address] [place], [place] [address] Tel. [phone]    Fax. [phone]

## 2016-03-03 NOTE — Telephone Encounter (Signed)
appt made and avs printed °

## 2016-03-15 ENCOUNTER — Other Ambulatory Visit: Payer: Self-pay | Admitting: *Deleted

## 2016-03-15 MED FILL — XELODA 500 MG TABLET: 500 | 7 days supply | Qty: 42 | Fill #0

## 2016-03-17 ENCOUNTER — Ambulatory Visit (HOSPITAL_BASED_OUTPATIENT_CLINIC_OR_DEPARTMENT_OTHER): Payer: Medicare Other | Admitting: Oncology

## 2016-03-17 ENCOUNTER — Other Ambulatory Visit (HOSPITAL_BASED_OUTPATIENT_CLINIC_OR_DEPARTMENT_OTHER): Payer: Medicare Other

## 2016-03-17 ENCOUNTER — Telehealth: Payer: Self-pay | Admitting: Oncology

## 2016-03-17 VITALS — BP 147/50 | HR 57 | Temp 98.3°F | Resp 19 | Ht 62.0 in | Wt 162.8 lb

## 2016-03-17 DIAGNOSIS — C773 Secondary and unspecified malignant neoplasm of axilla and upper limb lymph nodes: Secondary | ICD-10-CM

## 2016-03-17 DIAGNOSIS — C50311 Malignant neoplasm of lower-inner quadrant of right female breast: Secondary | ICD-10-CM

## 2016-03-17 DIAGNOSIS — Z171 Estrogen receptor negative status [ER-]: Secondary | ICD-10-CM | POA: Diagnosis not present

## 2016-03-17 LAB — COMPREHENSIVE METABOLIC PANEL
ALBUMIN: 3.5 g/dL (ref 3.5–5.0)
ALT: 10 U/L (ref 0–55)
AST: 27 U/L (ref 5–34)
Alkaline Phosphatase: 56 U/L (ref 40–150)
Anion Gap: 8 mEq/L (ref 3–11)
BUN: 18.7 mg/dL (ref 7.0–26.0)
CHLORIDE: 108 meq/L (ref 98–109)
CO2: 25 meq/L (ref 22–29)
Calcium: 9.5 mg/dL (ref 8.4–10.4)
Creatinine: 1.1 mg/dL (ref 0.6–1.1)
EGFR: 55 mL/min/{1.73_m2} — AB (ref >90)
GLUCOSE: 155 mg/dL — AB (ref 70–140)
POTASSIUM: 5.1 meq/L (ref 3.5–5.1)
SODIUM: 142 meq/L (ref 136–145)
Total Bilirubin: 0.37 mg/dL (ref 0.20–1.20)
Total Protein: 7.6 g/dL (ref 6.4–8.3)

## 2016-03-17 LAB — CBC WITH DIFFERENTIAL/PLATELET
BASO%: 0.9 % (ref 0.0–2.0)
BASOS ABS: 0.1 10*3/uL (ref 0.0–0.1)
EOS%: 3 % (ref 0.0–7.0)
Eosinophils Absolute: 0.2 10*3/uL (ref 0.0–0.5)
HCT: 34.5 % — ABNORMAL LOW (ref 34.8–46.6)
HGB: 11.1 g/dL — ABNORMAL LOW (ref 11.6–15.9)
LYMPH%: 41.7 % (ref 14.0–49.7)
MCH: 27.9 pg (ref 25.1–34.0)
MCHC: 32.3 g/dL (ref 31.5–36.0)
MCV: 86.4 fL (ref 79.5–101.0)
MONO#: 0.9 10*3/uL (ref 0.1–0.9)
MONO%: 11.8 % (ref 0.0–14.0)
NEUT#: 3.2 10*3/uL (ref 1.5–6.5)
NEUT%: 42.6 % (ref 38.4–76.8)
Platelets: 260 10*3/uL (ref 145–400)
RBC: 3.99 10*6/uL (ref 3.70–5.45)
RDW: 15.2 % — AB (ref 11.2–14.5)
WBC: 7.4 10*3/uL (ref 3.9–10.3)
lymph#: 3.1 10*3/uL (ref 0.9–3.3)

## 2016-03-17 NOTE — Telephone Encounter (Signed)
appt made and avs printed °

## 2016-03-17 NOTE — Progress Notes (Signed)
[name]  [phone] Fax:([phone]     ID: Jennifer Garrett DOB: 1932/03/30  MR#: [id number]  ZHY#:[id number]  Patient Care Team: Jennifer Ebbs, MD as PCP - General (Internal Medicine) Jennifer Skates, MD as Consulting Physician (General Surgery) Jennifer Cruel, MD as Consulting Physician (Oncology) Jennifer Breeding, MD as Consulting Physician (Cardiology) PCP: Jennifer Fendt, MD Jennifer Cruel, MD GYN: SU:  OTHER MD:  CHIEF COMPLAINT: Locally advanced triple negative breast cancer  CURRENT TREATMENT:capecitabine 1500 mg BID 7 days on then 7 days off. Started 01/27/2016.   BREAST CANCER HISTORY: From the original intake note:  The patient presented to the emergency room 11/20/2015 with a complaint of a mass in the lower aspect of her right breast, which was painful and bleeding. The patient stated the mass had been present approximately a month. An ultrasound of the breast was obtained which found a 4.4 cm mass in the right breast at the 6:00 position. This was read as most consistent with an abscess. The patient was started on clindamycin and referred to the [facility], where on 01/05/2016 she underwent bilateral diagnostic mammography with tomography and right  ultrasonography.   Mammography showed far posteriorly in the 5:00 region of the right breast a 4.1 cm mass with enlarged axillary adenopathy. On exam there was a fungating ulcerated bleeding mass in the right breast at the 5:00 position 7 cm from the nipple. The right axilla was "full" on palpation. Ultrasound showed a 5.4 cm mass at the 5:00 position of the right breast 7 cm from the nipple, with at least 3 enlarged abnormal axillary lymph nodes, the largest measuring 3.7 cm.  Biopsy of the right breast mass and one axillary lymph node on 01/06/2016 showed ([id number]) both to be positive for an invasive ductal carcinoma, grade 3, triple negative, with the HER-2 signals ratio between  1.21-1.40 and the number per cell 2.85-3.00. The MIB-1-2 was 70%.  The patient's subsequent history is as detailed below  INTERVAL HISTORY: Jennifer Garrett returns today for follow-up of her triple negative breast cancer accompanied by her [employer]. Today is day 0 cycle 4 of capecitabine. Seven continues to tolerate treatment well and specifically denies diarrhea, mouth sores, or any evidence of all are plantar erythrodysesthesia. She does have some acral hyperpigmentation.Marland Kitchen   REVIEW OF SYSTEMS: She denies fatigue and tells me her activities are normal for her. Sometimes she gets stinging pain 7 her right breast. Her vision is sometimes blurred. Her ankles swell. She feels forgetful but not anxious or depressed. These problems or new. A detailed review of systems today was stable  PAST MEDICAL HISTORY: Past Medical History:  Diagnosis Date  . Arthritis   . Asthma    rarely uses neb  . Breast cancer of lower-inner quadrant of right female breast ([place]) 01/22/2016  . Carpal tunnel syndrome   . Coronary artery disease    DES to OM3 90% stenosis 2003, 90% small RCA stenosis.    . Diabetes mellitus   . Hyperlipemia   . Hypertension     PAST SURGICAL HISTORY: Past Surgical History:  Procedure Laterality Date  . ABDOMINAL HYSTERECTOMY    . CARDIAC CATHETERIZATION  2003   stent-  . CARPAL TUNNEL RELEASE  03/29/2012   Procedure: CARPAL TUNNEL RELEASE;  Surgeon: Jennifer Sours, MD;  Location: [place] [facility];  Service: Orthopedics;  Laterality: Left;  . SHOULDER ARTHROSCOPY     right and left  . TRIGGER FINGER  RELEASE  03/29/2012   Procedure: RELEASE TRIGGER FINGER/A-1 PULLEY;  Surgeon: Jennifer Sours, MD;  Location: [place] [facility];  Service: Orthopedics;  Laterality: Left;    FAMILY HISTORY Family History  Problem Relation Age of Onset  . Heart disease Mother     No details   The patient's father died from "old age" at age 76. The patient's mother died at age 28 from  complications of high blood pressure and diabetes. The patient had one brother, 4 sisters. There is no cancer in the family to her knowledge   GYNECOLOGIC HISTORY:  No LMP recorded. Patient has had a hysterectomy.  menarche age 29, first live birth age 51. The patient is GX P2. She underwent abdominal hysterectomy with bilateral salpingo-oophorectomy in her 60s. She did not undergo hormone replacement  SOCIAL HISTORY:  Corvette used to work for [employer]. She lives with her daughter Jennifer Garrett, who is Medical sales representative for [employer]. The patient's son Jennifer Garrett lives in [place]. The patient has 1 grandchild, no great-grandchildren. She attends a local [employer] DIRECTIVES:  not in place. At the 01/22/2016 visit the patient was given the appropriate documents to complete. She tells me she intends to name her daughter Jennifer Garrett as her healthcare part of attorney    HEALTH MAINTENANCE: Social History  Substance Use Topics  . Smoking status: Former Smoker    Quit date: 03/24/1978  . Smokeless tobacco: Never Used  . Alcohol use No     Colonoscopy: Never   PAP: status post hysterectomy   Bone density: Never    Allergies  Allergen Reactions  . Lisinopril Other (See Comments)    States that it makes her "sensitive to light"  . Lyrica [Pregabalin] Swelling  . Feldene [Piroxicam] Rash  . Orudis [Ketoprofen] Rash  . Vibramycin [Doxycycline Calcium] Rash and Hives    Current Outpatient Prescriptions  Medication Sig Dispense Refill  . albuterol (PROVENTIL HFA;VENTOLIN HFA) 108 (90 BASE) MCG/ACT inhaler Inhale 1 puff into the lungs every 6 (six) hours as needed for wheezing or shortness of breath. Reported on 02/04/2016    . aspirin EC 81 MG tablet Take 1 tablet by mouth every morning.    Marland Kitchen atorvastatin (LIPITOR) 40 MG tablet Take 1 tablet (40 mg total) by mouth daily. 90 tablet 3  . Blood Glucose Monitoring Suppl (ACCU-CHEK AVIVA PLUS) w/Device KIT 1  Device by Does not apply route 2 (two) times daily. 1 kit 0  . calcium carbonate (OS-CAL) 600 MG TABS Take 600 mg by mouth 2 (two) times daily with a meal.    . diclofenac sodium (VOLTAREN) 1 % GEL Apply 1 application topically every other day.    Marland Kitchen glipiZIDE (GLUCOTROL) 10 MG tablet Take 10 mg by mouth 2 (two) times daily before a meal.    . glucose blood (ACCU-CHEK AVIVA) test strip Use as instructed 100 each 12  . hydrochlorothiazide (HYDRODIURIL) 25 MG tablet Take 25 mg by mouth daily.    Marland Kitchen KLOR-CON M20 20 MEQ tablet Take 20 mEq by mouth daily.    . Lancets (ACCU-CHEK SOFT TOUCH) lancets Use as instructed 100 each 12  . metroNIDAZOLE (FLAGYL) 500 MG tablet Crush 1 tablet daily to use on open wound 30 tablet 3  . olmesartan (BENICAR) 40 MG tablet Take 40 mg by mouth daily.    . sitaGLIPtin (JANUVIA) 100 MG tablet Take 100 mg by mouth daily.     No  current facility-administered medications for this visit.     OBJECTIVE: Older African-American woman Who appears well for her age 89:   03/17/16 1219  BP: (!) 147/50  Pulse: (!) 57  Resp: 19  Temp: 98.3 F (36.8 C)     Body mass index is 29.78 kg/m.    ECOG FS:1 - Symptomatic but completely ambulatory  Sclerae unicteric, pupils round and equal Oropharynx clear and moist-- no thrush or other lesions No cervical or supraclavicular adenopathy Lungs no rales or rhonchi Heart regular rate and rhythm Abd soft, nontender, positive bowel sounds MSK no focal spinal tenderness, no upper extremity lymphedema Neuro: nonfocal, well oriented, appropriate affect Breasts: Deferred     Right breast 03/05/2016     01/22/2016    LAB RESULTS:  CMP     Component Value Date/Time   NA 142 03/17/2016 1143   K 5.1 03/17/2016 1143   CL 103 02/13/2016 0939   CO2 25 03/17/2016 1143   GLUCOSE 155 (H) 03/17/2016 1143   BUN 18.7 03/17/2016 1143   CREATININE 1.1 03/17/2016 1143   CALCIUM 9.5 03/17/2016 1143   PROT 7.6 03/17/2016 1143    ALBUMIN 3.5 03/17/2016 1143   AST 27 03/17/2016 1143   ALT 10 03/17/2016 1143   ALKPHOS 56 03/17/2016 1143   BILITOT 0.37 03/17/2016 1143   GFRNONAA 42 (L) 02/13/2016 0939   GFRAA 48 (L) 02/13/2016 0939    INo results found for: SPEP, UPEP  Lab Results  Component Value Date   WBC 7.4 03/17/2016   NEUTROABS 3.2 03/17/2016   HGB 11.1 (L) 03/17/2016   HCT 34.5 (L) 03/17/2016   MCV 86.4 03/17/2016   PLT 260 03/17/2016      Chemistry      Component Value Date/Time   NA 142 03/17/2016 1143   K 5.1 03/17/2016 1143   CL 103 02/13/2016 0939   CO2 25 03/17/2016 1143   BUN 18.7 03/17/2016 1143   CREATININE 1.1 03/17/2016 1143      Component Value Date/Time   CALCIUM 9.5 03/17/2016 1143   ALKPHOS 56 03/17/2016 1143   AST 27 03/17/2016 1143   ALT 10 03/17/2016 1143   BILITOT 0.37 03/17/2016 1143       No results found for: LABCA2  No components found for: LABCA125  No results for input(s): INR in the last 168 hours.  Urinalysis    Component Value Date/Time   COLORURINE YELLOW 02/13/2016 1122   APPEARANCEUR CLEAR 02/13/2016 1122   LABSPEC 1.016 02/13/2016 1122   PHURINE 5.5 02/13/2016 1122   GLUCOSEU NEGATIVE 02/13/2016 1122   HGBUR NEGATIVE 02/13/2016 1122   BILIRUBINUR NEGATIVE 02/13/2016 1122   KETONESUR NEGATIVE 02/13/2016 1122   PROTEINUR NEGATIVE 02/13/2016 1122   UROBILINOGEN 1.0 11/28/2013 0143   NITRITE NEGATIVE 02/13/2016 1122   LEUKOCYTESUR NEGATIVE 02/13/2016 1122     STUDIES: No results found.  ELIGIBLE FOR AVAILABLE RESEARCH PROTOCOL: no   ASSESSMENT: 82 y.o. [place] woman status post right breast lower outer quadrant and right axillary lymph node biopsy 01/06/2016 both positive for a clinical T4 N1, stage IIIB invasive ductal carcinoma, grade 3, triple negative, with an MIB-1 of 70%.  (a) CT scan of the chest and bone scan 01/27/2016 showed no evidence of metastatic disease.   (1) neoadjuvant chemotherapy consisting of capecitabine, 1500 mg  twice daily, 7 days on 7 days off, starting 01/27/2016  (2) right modified radical mastectomy to follow   (3) adjuvant radiation to follow surgery  PLAN: Jennifer Garrett  is tolerating the capecitabine well and is ready to proceed to her fourth cycle. She will return in 2 weeks for labs only--assuming she has had no symptoms she does not need to see me at that time.  She will then see me again in 4 weeks. Before that visit she will have a restaging chest CT scan. The goal of course is to reduce the tumor as much as possible before proceeding to definitive surgery and radiation for local she knows to call for any problems that may develop before the next visit.  Jennifer Cruel, MD   03/17/2016 3:48 PM Medical Oncology and Hematology [facility] [address] [place], [place] [address] Tel. [phone]    Fax. [phone]

## 2016-03-29 ENCOUNTER — Other Ambulatory Visit: Payer: Self-pay | Admitting: Oncology

## 2016-03-31 ENCOUNTER — Other Ambulatory Visit (HOSPITAL_BASED_OUTPATIENT_CLINIC_OR_DEPARTMENT_OTHER): Payer: Medicare Other

## 2016-03-31 ENCOUNTER — Ambulatory Visit: Payer: Medicare Other | Admitting: Oncology

## 2016-03-31 ENCOUNTER — Telehealth: Payer: Self-pay

## 2016-03-31 DIAGNOSIS — C50311 Malignant neoplasm of lower-inner quadrant of right female breast: Secondary | ICD-10-CM | POA: Diagnosis not present

## 2016-03-31 LAB — CBC WITH DIFFERENTIAL/PLATELET
BASO%: 0.9 % (ref 0.0–2.0)
BASOS ABS: 0.1 10*3/uL (ref 0.0–0.1)
EOS%: 3.5 % (ref 0.0–7.0)
Eosinophils Absolute: 0.3 10*3/uL (ref 0.0–0.5)
HEMATOCRIT: 34.2 % — AB (ref 34.8–46.6)
HGB: 10.9 g/dL — ABNORMAL LOW (ref 11.6–15.9)
LYMPH#: 2.7 10*3/uL (ref 0.9–3.3)
LYMPH%: 35.5 % (ref 14.0–49.7)
MCH: 27.9 pg (ref 25.1–34.0)
MCHC: 31.9 g/dL (ref 31.5–36.0)
MCV: 87.6 fL (ref 79.5–101.0)
MONO#: 0.8 10*3/uL (ref 0.1–0.9)
MONO%: 10.5 % (ref 0.0–14.0)
NEUT#: 3.8 10*3/uL (ref 1.5–6.5)
NEUT%: 49.6 % (ref 38.4–76.8)
PLATELETS: 258 10*3/uL (ref 145–400)
RBC: 3.91 10*6/uL (ref 3.70–5.45)
RDW: 15.9 % — ABNORMAL HIGH (ref 11.2–14.5)
WBC: 7.7 10*3/uL (ref 3.9–10.3)

## 2016-03-31 LAB — COMPREHENSIVE METABOLIC PANEL
ALT: 12 U/L (ref 0–55)
ANION GAP: 10 meq/L (ref 3–11)
AST: 24 U/L (ref 5–34)
Albumin: 3.4 g/dL — ABNORMAL LOW (ref 3.5–5.0)
Alkaline Phosphatase: 57 U/L (ref 40–150)
BUN: 16.1 mg/dL (ref 7.0–26.0)
CALCIUM: 10 mg/dL (ref 8.4–10.4)
CHLORIDE: 108 meq/L (ref 98–109)
CO2: 24 mEq/L (ref 22–29)
Creatinine: 1.2 mg/dL — ABNORMAL HIGH (ref 0.6–1.1)
EGFR: 49 mL/min/{1.73_m2} — AB (ref >90)
Glucose: 171 mg/dl — ABNORMAL HIGH (ref 70–140)
POTASSIUM: 4.5 meq/L (ref 3.5–5.1)
Sodium: 142 mEq/L (ref 136–145)
Total Bilirubin: 0.56 mg/dL (ref 0.20–1.20)
Total Protein: 7.5 g/dL (ref 6.4–8.3)

## 2016-03-31 MED ORDER — CAPECITABINE 500 MG PO TABS: 1500.0000 mg | ORAL_TABLET | Freq: Two times a day (BID) | ORAL | 0 refills | Status: DC

## 2016-03-31 NOTE — Telephone Encounter (Signed)
Daughter asked about chemo pill prescription while mother getting labs drawn.  Labs reviewed by Dr Jana Hakim and capecitabine rx faxed to [facility] outpatient pharmacy

## 2016-04-05 MED FILL — XELODA 500 MG TABLET: 500 | 14 days supply | Qty: 84 | Fill #0

## 2016-04-12 ENCOUNTER — Ambulatory Visit (HOSPITAL_COMMUNITY)
Admission: RE | Admit: 2016-04-12 | Discharge: 2016-04-12 | Disposition: A | Discharge status: Home / Self Care | Payer: Medicare Other | Source: Ambulatory Visit | Attending: Oncology | Admitting: Oncology

## 2016-04-12 DIAGNOSIS — I2510 Atherosclerotic heart disease of native coronary artery without angina pectoris: Secondary | ICD-10-CM | POA: Diagnosis not present

## 2016-04-12 DIAGNOSIS — I700 Atherosclerosis of aorta: Secondary | ICD-10-CM | POA: Insufficient documentation

## 2016-04-12 DIAGNOSIS — R234 Changes in skin texture: Secondary | ICD-10-CM | POA: Diagnosis not present

## 2016-04-12 DIAGNOSIS — C50311 Malignant neoplasm of lower-inner quadrant of right female breast: Secondary | ICD-10-CM | POA: Diagnosis not present

## 2016-04-12 DIAGNOSIS — R918 Other nonspecific abnormal finding of lung field: Secondary | ICD-10-CM | POA: Insufficient documentation

## 2016-04-12 DIAGNOSIS — E042 Nontoxic multinodular goiter: Secondary | ICD-10-CM | POA: Insufficient documentation

## 2016-04-12 DIAGNOSIS — J432 Centrilobular emphysema: Secondary | ICD-10-CM | POA: Diagnosis not present

## 2016-04-12 MED ORDER — IOPAMIDOL (ISOVUE-300) INJECTION 61%
100.0000 mL | Freq: Once | INTRAVENOUS | Status: AC | PRN
  Administered 2016-04-12: 100 mL via INTRAVENOUS

## 2016-04-14 ENCOUNTER — Ambulatory Visit (HOSPITAL_BASED_OUTPATIENT_CLINIC_OR_DEPARTMENT_OTHER): Payer: Medicare Other | Admitting: Oncology

## 2016-04-14 ENCOUNTER — Other Ambulatory Visit (HOSPITAL_BASED_OUTPATIENT_CLINIC_OR_DEPARTMENT_OTHER): Payer: Medicare Other

## 2016-04-14 VITALS — BP 169/67 | HR 52 | Temp 97.9°F | Resp 18 | Ht 62.0 in | Wt 164.2 lb

## 2016-04-14 DIAGNOSIS — R229 Localized swelling, mass and lump, unspecified: Secondary | ICD-10-CM | POA: Diagnosis not present

## 2016-04-14 DIAGNOSIS — C50311 Malignant neoplasm of lower-inner quadrant of right female breast: Secondary | ICD-10-CM | POA: Diagnosis present

## 2016-04-14 DIAGNOSIS — Z171 Estrogen receptor negative status [ER-]: Secondary | ICD-10-CM

## 2016-04-14 DIAGNOSIS — C773 Secondary and unspecified malignant neoplasm of axilla and upper limb lymph nodes: Secondary | ICD-10-CM | POA: Diagnosis not present

## 2016-04-14 LAB — COMPREHENSIVE METABOLIC PANEL
ALT: 10 U/L (ref 0–55)
ANION GAP: 9 meq/L (ref 3–11)
AST: 23 U/L (ref 5–34)
Albumin: 3.3 g/dL — ABNORMAL LOW (ref 3.5–5.0)
Alkaline Phosphatase: 62 U/L (ref 40–150)
BILIRUBIN TOTAL: 0.37 mg/dL (ref 0.20–1.20)
BUN: 16.4 mg/dL (ref 7.0–26.0)
CHLORIDE: 109 meq/L (ref 98–109)
CO2: 23 meq/L (ref 22–29)
Calcium: 9.2 mg/dL (ref 8.4–10.4)
Creatinine: 1.1 mg/dL (ref 0.6–1.1)
EGFR: 56 mL/min/{1.73_m2} — AB (ref >90)
GLUCOSE: 148 mg/dL — AB (ref 70–140)
POTASSIUM: 4.5 meq/L (ref 3.5–5.1)
SODIUM: 141 meq/L (ref 136–145)
Total Protein: 7.5 g/dL (ref 6.4–8.3)

## 2016-04-14 LAB — CBC WITH DIFFERENTIAL/PLATELET
BASO%: 1.1 % (ref 0.0–2.0)
Basophils Absolute: 0.1 10*3/uL (ref 0.0–0.1)
EOS ABS: 0.3 10*3/uL (ref 0.0–0.5)
EOS%: 4.6 % (ref 0.0–7.0)
HCT: 32.7 % — ABNORMAL LOW (ref 34.8–46.6)
HGB: 10.5 g/dL — ABNORMAL LOW (ref 11.6–15.9)
LYMPH%: 39.3 % (ref 14.0–49.7)
MCH: 28 pg (ref 25.1–34.0)
MCHC: 32.1 g/dL (ref 31.5–36.0)
MCV: 87.2 fL (ref 79.5–101.0)
MONO#: 0.8 10*3/uL (ref 0.1–0.9)
MONO%: 11.6 % (ref 0.0–14.0)
NEUT#: 3 10*3/uL (ref 1.5–6.5)
NEUT%: 43.4 % (ref 38.4–76.8)
PLATELETS: 271 10*3/uL (ref 145–400)
RBC: 3.75 10*6/uL (ref 3.70–5.45)
RDW: 15.6 % — ABNORMAL HIGH (ref 11.2–14.5)
WBC: 6.8 10*3/uL (ref 3.9–10.3)
lymph#: 2.7 10*3/uL (ref 0.9–3.3)

## 2016-04-14 NOTE — Progress Notes (Signed)
.  [place]  Telephone:(336) [phone] Fax:(336) [phone]     ID: Jennifer Garrett DOB: 1931/09/08  MR#: [id number]  DZH#:[id number]  Patient Care Team: Jennifer Ebbs, MD as PCP - General (Internal Medicine) Jennifer Skates, MD as Consulting Physician (General Surgery) Jennifer Cruel, MD as Consulting Physician (Oncology) Jennifer Breeding, MD as Consulting Physician (Cardiology) PCP: Jennifer Fendt, MD Jennifer Cruel, MD GYN: SU:  OTHER MD:  CHIEF COMPLAINT: Locally advanced triple negative breast cancer  CURRENT TREATMENT:capecitabine 1500 mg BID 7 days on then 7 days off. Started 01/27/2016.   BREAST CANCER HISTORY: From the original intake note:  The patient presented to the emergency room 11/20/2015 with a complaint of a mass in the lower aspect of her right breast, which was painful and bleeding. The patient stated the mass had been present approximately a month. An ultrasound of the breast was obtained which found a 4.4 cm mass in the right breast at the 6:00 position. This was read as most consistent with an abscess. The patient was started on clindamycin and referred to the [place], where on 01/05/2016 she underwent bilateral diagnostic mammography with tomography and right  ultrasonography.   Mammography showed far posteriorly in the 5:00 region of the right breast a 4.1 cm mass with enlarged axillary adenopathy. On exam there was a fungating ulcerated bleeding mass in the right breast at the 5:00 position 7 cm from the nipple. The right axilla was "full" on palpation. Ultrasound showed a 5.4 cm mass at the 5:00 position of the right breast 7 cm from the nipple, with at least 3 enlarged abnormal axillary lymph nodes, the largest measuring 3.7 cm.  Biopsy of the right breast mass and one axillary lymph node on 01/06/2016 showed ([id number]) both to be positive for an invasive ductal carcinoma, grade 3, triple negative, with the HER-2 signals ratio between  1.21-1.40 and the number per cell 2.85-3.00. The MIB-1-2 was 70%.  The patient's subsequent history is as detailed below  INTERVAL HISTORY: (will need eribulin,referral to surgery) Jennifer Garrett returns today for follow-up of her triple negative breast cancer accompanied by her daughter. She continues on capecitabine, with good tolerance. Specifically she denies problems with diarrhea, palmar plantar erythrodysesthesia, or mouth sores. I asked her if she felt the chemotherapy was working and she says she really believes it has. She only has a little bit of discomfort around the nipple she says.  However the CT scan we just obtained shows the mass in the right inframammary fold to have increased from 4.8 cm to 5.8 cm. The right axillary lymph node is stable. We do not see evidence of metastatic disease: There are a few scattered pulmonary nodules which have not changed and they are all subcentimeter.   REVIEW OF SYSTEMS: The patient maintains her usual functional status and reports no other symptoms. Her daughter tells me Jennifer Garrett is doing a lot of cooking and housecleaning and generally feels well. The daughter does the dressing changes at home.  PAST MEDICAL HISTORY: Past Medical History:  Diagnosis Date  . Arthritis   . Asthma    rarely uses neb  . Breast cancer of lower-inner quadrant of right female breast ([place]) 01/22/2016  . Carpal tunnel syndrome   . Coronary artery disease    DES to OM3 90% stenosis 2003, 90% small RCA stenosis.    . Diabetes mellitus   . Hyperlipemia   . Hypertension     PAST SURGICAL HISTORY: Past Surgical History:  Procedure Laterality Date  . ABDOMINAL HYSTERECTOMY    . CARDIAC CATHETERIZATION  2003   stent-  . CARPAL TUNNEL RELEASE  03/29/2012   Procedure: CARPAL TUNNEL RELEASE;  Surgeon: Jennifer Sours, MD;  Location: [facility];  Service: Orthopedics;  Laterality: Left;  . SHOULDER ARTHROSCOPY     right and left  . TRIGGER FINGER RELEASE   03/29/2012   Procedure: RELEASE TRIGGER FINGER/A-1 PULLEY;  Surgeon: Jennifer Sours, MD;  Location: [facility];  Service: Orthopedics;  Laterality: Left;    FAMILY HISTORY Family History  Problem Relation Age of Onset  . Heart disease Mother     No details   The patient's father died from "old age" at age 47. The patient's mother died at age 37 from complications of high blood pressure and diabetes. The patient had one brother, 4 sisters. There is no cancer in the family to her knowledge   GYNECOLOGIC HISTORY:  No LMP recorded. Patient has had a hysterectomy.  menarche age 71, first live birth age 12. The patient is GX P2. She underwent abdominal hysterectomy with bilateral salpingo-oophorectomy in her 30s. She did not undergo hormone replacement  SOCIAL HISTORY:  Jennifer Garrett used to work for [employer]. She lives with her daughter Jennifer Garrett, who is Medical sales representative for [employer]. The patient's son Jennifer Garrett lives in [place]. The patient has 1 grandchild, no great-grandchildren. She attends a local [place] DIRECTIVES:  not in place. At the 01/22/2016 visit the patient was given the appropriate documents to complete. She tells me she intends to name her daughter Jennifer Garrett as her healthcare part of attorney    HEALTH MAINTENANCE: Social History  Substance Use Topics  . Smoking status: Former Smoker    Quit date: 03/24/1978  . Smokeless tobacco: Never Used  . Alcohol use No     Colonoscopy: Never   PAP: status post hysterectomy   Bone density: Never    Allergies  Allergen Reactions  . Lisinopril Other (See Comments)    States that it makes her "sensitive to light"  . Lyrica [Pregabalin] Swelling  . Feldene [Piroxicam] Rash  . Orudis [Ketoprofen] Rash  . Vibramycin [Doxycycline Calcium] Rash and Hives    Current Outpatient Prescriptions  Medication Sig Dispense Refill  . albuterol (PROVENTIL HFA;VENTOLIN HFA) 108 (90 BASE)  MCG/ACT inhaler Inhale 1 puff into the lungs every 6 (six) hours as needed for wheezing or shortness of breath. Reported on 02/04/2016    . aspirin EC 81 MG tablet Take 1 tablet by mouth every morning.    Marland Kitchen atorvastatin (LIPITOR) 40 MG tablet Take 1 tablet (40 mg total) by mouth daily. 90 tablet 3  . Blood Glucose Monitoring Suppl (ACCU-CHEK AVIVA PLUS) w/Device KIT 1 Device by Does not apply route 2 (two) times daily. 1 kit 0  . calcium carbonate (OS-CAL) 600 MG TABS Take 600 mg by mouth 2 (two) times daily with a meal.    . diclofenac sodium (VOLTAREN) 1 % GEL Apply 1 application topically every other day.    Marland Kitchen glipiZIDE (GLUCOTROL) 10 MG tablet Take 10 mg by mouth 2 (two) times daily before a meal.    . glucose blood (ACCU-CHEK AVIVA) test strip Use as instructed 100 each 12  . hydrochlorothiazide (HYDRODIURIL) 25 MG tablet Take 25 mg by mouth daily.    Marland Kitchen KLOR-CON M20 20 MEQ tablet Take 20 mEq by mouth daily.    Marland Kitchen  Lancets (ACCU-CHEK SOFT TOUCH) lancets Use as instructed 100 each 12  . metroNIDAZOLE (FLAGYL) 500 MG tablet Crush 1 tablet daily to use on open wound 30 tablet 3  . olmesartan (BENICAR) 40 MG tablet Take 40 mg by mouth daily.    . sitaGLIPtin (JANUVIA) 100 MG tablet Take 100 mg by mouth daily.     No current facility-administered medications for this visit.     OBJECTIVE: Older African-American woman In no acute distress Vitals:   04/14/16 0910  BP: (!) 169/67  Pulse: (!) 52  Resp: 18  Temp: 97.9 F (36.6 C)     Body mass index is 30.03 kg/m.    ECOG FS:1 - Symptomatic but completely ambulatory  Sclerae unicteric, EOMs intact Oropharynx clear  No cervical or supraclavicular adenopathy Lungs no rales or rhonchi Heart regular rate and rhythm Abd soft, nontender, positive bowel sounds MSK no focal spinal tenderness, no upper extremity lymphedema Neuro: nonfocal, well oriented, positive affect Breasts: The right breast is imaged below. On the breast itself there are  separate tumor nodules each measuring approximately 1 cm, an ulcerated. In the inframammary fold there continues to be a large mass measuring approximately 4 cm. The right axilla shows fixed adenopathy. The left breast is unremarkable   Photo right inframammary fold 04/14/2016    Photo dorsum right breast 04/14/2016       Right breast 03/05/2016     01/22/2016    LAB RESULTS:  CMP     Component Value Date/Time   NA 141 04/14/2016 0758   K 4.5 04/14/2016 0758   CL 103 02/13/2016 0939   CO2 23 04/14/2016 0758   GLUCOSE 148 (H) 04/14/2016 0758   BUN 16.4 04/14/2016 0758   CREATININE 1.1 04/14/2016 0758   CALCIUM 9.2 04/14/2016 0758   PROT 7.5 04/14/2016 0758   ALBUMIN 3.3 (L) 04/14/2016 0758   AST 23 04/14/2016 0758   ALT 10 04/14/2016 0758   ALKPHOS 62 04/14/2016 0758   BILITOT 0.37 04/14/2016 0758   GFRNONAA 42 (L) 02/13/2016 0939   GFRAA 48 (L) 02/13/2016 0939    INo results found for: SPEP, UPEP  Lab Results  Component Value Date   WBC 6.8 04/14/2016   NEUTROABS 3.0 04/14/2016   HGB 10.5 (L) 04/14/2016   HCT 32.7 (L) 04/14/2016   MCV 87.2 04/14/2016   PLT 271 04/14/2016      Chemistry      Component Value Date/Time   NA 141 04/14/2016 0758   K 4.5 04/14/2016 0758   CL 103 02/13/2016 0939   CO2 23 04/14/2016 0758   BUN 16.4 04/14/2016 0758   CREATININE 1.1 04/14/2016 0758      Component Value Date/Time   CALCIUM 9.2 04/14/2016 0758   ALKPHOS 62 04/14/2016 0758   AST 23 04/14/2016 0758   ALT 10 04/14/2016 0758   BILITOT 0.37 04/14/2016 0758       No results found for: LABCA2  No components found for: LABCA125  No results for input(s): INR in the last 168 hours.  Urinalysis    Component Value Date/Time   COLORURINE YELLOW 02/13/2016 1122   APPEARANCEUR CLEAR 02/13/2016 1122   LABSPEC 1.016 02/13/2016 1122   PHURINE 5.5 02/13/2016 1122   GLUCOSEU NEGATIVE 02/13/2016 1122   HGBUR NEGATIVE 02/13/2016 1122   BILIRUBINUR NEGATIVE  02/13/2016 1122   KETONESUR NEGATIVE 02/13/2016 1122   PROTEINUR NEGATIVE 02/13/2016 1122   UROBILINOGEN 1.0 11/28/2013 0143   NITRITE NEGATIVE 02/13/2016 1122  LEUKOCYTESUR NEGATIVE 02/13/2016 1122     STUDIES: Ct Chest W Contrast  Result Date: 04/12/2016 CLINICAL DATA:  Right breast cancer diagnosed July 2017 with ongoing oral chemotherapy, presenting for restaging. EXAM: CT CHEST WITH CONTRAST TECHNIQUE: Multidetector CT imaging of the chest was performed during intravenous contrast administration. CONTRAST:  146m ISOVUE-300 IOPAMIDOL (ISOVUE-300) INJECTION 61% COMPARISON:  01/27/2016 chest CT. FINDINGS: Cardiovascular: Normal heart size. No significant pericardial fluid/thickening. Left anterior descending and left circumflex coronary atherosclerosis. Atherosclerotic nonaneurysmal thoracic aorta. Normal caliber pulmonary arteries. No central pulmonary emboli. Mediastinum/Nodes: Bulky multinodular goiter with dominant 3.0 cm posterior right liver lobe nodule common not appreciably changed. Unremarkable esophagus. There are multiple enlarged right axillary nodes measuring up to 2.1 cm (series 2/ image 19), previously 2.1 cm, stable. A 1.9 x 1.5 cm mass in the lateral upper right breast (series 2/ image 40), previously 1.8 x 1.6 cm, not appreciably changed. No pathologically enlarged left axillary, mediastinal or hilar lymph nodes. Lungs/Pleura: No pneumothorax. No pleural effusion. Subpleural 0.9 cm anterior right middle lobe pulmonary nodule (series 5/image 83), stable. There are 3 mm pulmonary nodules in the right upper lobe (series 5/image 30), anterior left lower lobe (series 5/ image 107) and posterior left lower lobe (series 5/ image 94), all stable. No acute consolidative airspace disease, lung masses or new significant pulmonary nodules. Mild centrilobular emphysema. Upper abdomen: Unremarkable. Musculoskeletal: No aggressive appearing focal osseous lesions. Mild thoracic spondylosis.  Irregular inframammary fold right breast 5.8 x 4.8 cm mass (series 2/ image 100), previously 4.8 x 3.5 cm using similar measurement technique, increased in size. Diffuse skin thickening throughout the lower right breast appears grossly unchanged. IMPRESSION: 1. Irregular 5.8 cm right breast mass at the inframammary fold is increased in size, presumably representing growth of the primary right breast malignancy. Stable prominent skin thickening throughout the right lower breast. 2. Right axillary/upper outer right breast nodal metastases are stable. No new sites of nodal metastatic disease in the chest. 3. Four scattered pulmonary nodules are stable. No new significant pulmonary nodules. 4. Additional findings include aortic atherosclerosis, 2 vessel coronary atherosclerosis, mild centrilobular emphysema and stable bulky multinodular goiter. Electronically Signed   By: JIlona SorrelM.D.   On: 04/12/2016 12:35    ELIGIBLE FOR AVAILABLE RESEARCH PROTOCOL: no   ASSESSMENT: 82y.o. [place] woman status post right breast lower outer quadrant and right axillary lymph node biopsy 01/06/2016 both positive for a clinical T4 N1, stage IIIB invasive ductal carcinoma, grade 3, triple negative, with an MIB-1 of 70%.  (a) CT scan of the chest and bone scan 01/27/2016 showed no evidence of metastatic disease.   (1) neoadjuvant chemotherapy consisting of capecitabine, 1500 mg twice daily, 7 days on 7 days off, starting 01/27/2016, discontinued 04/14/2016  (2) right modified radical mastectomy to follow   (3) adjuvant radiation to follow surgery  PLAN: RBlondinais remarkably asymptomatic considering the extent of her local disease. There is some bleeding and some smell associated with these wounds which most patients would bitterly complaining about but which she simply ignore's.  She has also tolerated her chemotherapy quite well. Unfortunately we have not made any progress at shrinking the tumor with  capecitabine.  I am asking her surgeon Dr. IDalbert Batmanto take another look at her to see if there is any possibility for right modified radical mastectomy at this point. If she were to have surgery we would want a port placed at the same time.  If she is not to have surgery  at this point then we would request a port be placed in any case so we can start eribulin, which she would receive on days 1 and 8 of each 21 day cycle. Planned starting date is 05/05/2016  Tentatively I am making her a return appointment with me in October 13 to discuss the new treatment but at this point they understand that we need to change and are agreeable with the overall plan.  Jennifer Cruel, MD   04/14/2016 9:39 AM Medical Oncology and Hematology [facility] [address] [place], [place] [address] Tel. [phone]    Fax. [phone]

## 2016-04-26 ENCOUNTER — Other Ambulatory Visit: Payer: Self-pay | Admitting: General Surgery

## 2016-04-29 ENCOUNTER — Other Ambulatory Visit: Payer: Self-pay | Admitting: Oncology

## 2016-04-30 ENCOUNTER — Ambulatory Visit (HOSPITAL_BASED_OUTPATIENT_CLINIC_OR_DEPARTMENT_OTHER): Payer: Medicare Other | Admitting: Oncology

## 2016-04-30 VITALS — BP 149/52 | HR 57 | Temp 97.7°F | Resp 18 | Ht 62.0 in | Wt 162.1 lb

## 2016-04-30 DIAGNOSIS — Z171 Estrogen receptor negative status [ER-]: Secondary | ICD-10-CM

## 2016-04-30 DIAGNOSIS — R918 Other nonspecific abnormal finding of lung field: Secondary | ICD-10-CM | POA: Diagnosis not present

## 2016-04-30 DIAGNOSIS — C50311 Malignant neoplasm of lower-inner quadrant of right female breast: Secondary | ICD-10-CM | POA: Diagnosis present

## 2016-04-30 DIAGNOSIS — C773 Secondary and unspecified malignant neoplasm of axilla and upper limb lymph nodes: Secondary | ICD-10-CM | POA: Diagnosis not present

## 2016-04-30 NOTE — Progress Notes (Signed)
.  [place]  [phone] Fax:(336) [phone]     ID: Jennifer Garrett DOB: Jan 13, 1932  MR#: [id number]  OBS#:[id number]  Patient Care Team: Jennifer Ebbs, MD as PCP - General (Internal Medicine) Jennifer Skates, MD as Consulting Physician (General Surgery) Jennifer Cruel, MD as Consulting Physician (Oncology) Jennifer Breeding, MD as Consulting Physician (Cardiology) PCP: Jennifer Fendt, MD Jennifer Cruel, MD GYN: SU:  OTHER MD:  CHIEF COMPLAINT: Locally advanced triple negative breast cancer  CURRENT TREATMENT: Awaiting definitive surgery   BREAST CANCER HISTORY: From the original intake note:  The patient presented to the emergency room 11/20/2015 with a complaint of a mass in the lower aspect of her right breast, which was painful and bleeding. The patient stated the mass had been present approximately a month. An ultrasound of the breast was obtained which found a 4.4 cm mass in the right breast at the 6:00 position. This was read as most consistent with an abscess. The patient was started on clindamycin and referred to the [facility], where on 01/05/2016 she underwent bilateral diagnostic mammography with tomography and right  ultrasonography.   Mammography showed far posteriorly in the 5:00 region of the right breast a 4.1 cm mass with enlarged axillary adenopathy. On exam there was a fungating ulcerated bleeding mass in the right breast at the 5:00 position 7 cm from the nipple. The right axilla was "full" on palpation. Ultrasound showed a 5.4 cm mass at the 5:00 position of the right breast 7 cm from the nipple, with at least 3 enlarged abnormal axillary lymph nodes, the largest measuring 3.7 cm.  Biopsy of the right breast mass and one axillary lymph node on 01/06/2016 showed ([id number]) both to be positive for an invasive ductal carcinoma, grade 3, triple negative, with the HER-2 signals ratio between 1.21-1.40 and the number per cell 2.85-3.00.  The MIB-1-2 was 70%.  The patient's subsequent history is as detailed below  INTERVAL HISTORY:  Jennifer Garrett returns today with her daughter Jennifer Garrett for follow-up of Jennifer Garrett's breast cancer. The patient completed 2-1/2 months of neoadjuvant capecitabine treatment and we have just restage her. Unfortunately the CT scan shows disease progression. I also think that the lung nodules really cannot be ignored and are very likely metastatic disease. All this was discussed in detail today  Since the last visit here the patient has met with Gen. surgery and plastic surgery. They feel a will be possible to obtain local control surgically. She may need a skin graft and she likely will need to be in a rehabilitation facility for some weeks postop. They are agreeable to this plan.   REVIEW OF SYSTEMS: Jennifer Garrett complains of pain at the site of her breast cancer, and of course of there is also a bad smell at times which bothers her. She is controlling the pain with aspirin. She tries to do her house work and other daily activities as before. Her daughter is changing her bandages daily. They do not feel a nursing help at present. There have been no unusual headaches, visual changes, nausea, or vomiting. She did not develop mouth sores diarrhea or palmar plantar erythrodysesthesia from the capecitabine. A detailed review of systems today was otherwise stable  PAST MEDICAL HISTORY: Past Medical History:  Diagnosis Date  . Arthritis   . Asthma    rarely uses neb  . Breast cancer of lower-inner quadrant of right female breast ([facility]) 01/22/2016  . Carpal tunnel syndrome   . Coronary artery disease  DES to OM3 90% stenosis 2003, 90% small RCA stenosis.    . Diabetes mellitus   . Hyperlipemia   . Hypertension     PAST SURGICAL HISTORY: Past Surgical History:  Procedure Laterality Date  . ABDOMINAL HYSTERECTOMY    . CARDIAC CATHETERIZATION  2003   stent-  . CARPAL TUNNEL RELEASE  03/29/2012   Procedure: CARPAL TUNNEL  RELEASE;  Surgeon: Jennifer Sours, MD;  Location: [facility];  Service: Orthopedics;  Laterality: Left;  . SHOULDER ARTHROSCOPY     right and left  . TRIGGER FINGER RELEASE  03/29/2012   Procedure: RELEASE TRIGGER FINGER/A-1 PULLEY;  Surgeon: Jennifer Sours, MD;  Location: [facility];  Service: Orthopedics;  Laterality: Left;    FAMILY HISTORY Family History  Problem Relation Age of Onset  . Heart disease Mother     No details   The patient's father died from "old age" at age 48. The patient's mother died at age 49 from complications of high blood pressure and diabetes. The patient had one brother, 4 sisters. There is no cancer in the family to her knowledge   GYNECOLOGIC HISTORY:  No LMP recorded. Patient has had a hysterectomy.  menarche age 45, first live birth age 41. The patient is GX P2. She underwent abdominal hysterectomy with bilateral salpingo-oophorectomy in her 21s. She did not undergo hormone replacement  SOCIAL HISTORY:  Jennifer Garrett used to work for [employer]. She lives with her daughter Jennifer Garrett, who is Medical sales representative for [employer]. The patient's son Jennifer Garrett lives in [place]. The patient has 1 grandchild, no great-grandchildren. She attends a local Jennifer Garrett DIRECTIVES:  not in place. At the 01/22/2016 visit the patient was given the appropriate documents to complete. She tells me she intends to name her daughter Jennifer Garrett as her healthcare part of attorney    HEALTH MAINTENANCE: Social History  Substance Use Topics  . Smoking status: Former Smoker    Quit date: 03/24/1978  . Smokeless tobacco: Never Used  . Alcohol use No     Colonoscopy: Never   PAP: status post hysterectomy   Bone density: Never    Allergies  Allergen Reactions  . Lisinopril Other (See Comments)    States that it makes her "sensitive to light"  . Lyrica [Pregabalin] Swelling  . Feldene [Piroxicam] Rash  . Orudis  [Ketoprofen] Rash  . Vibramycin [Doxycycline Calcium] Rash and Hives    Current Outpatient Prescriptions  Medication Sig Dispense Refill  . albuterol (PROVENTIL HFA;VENTOLIN HFA) 108 (90 BASE) MCG/ACT inhaler Inhale 1 puff into the lungs every 6 (six) hours as needed for wheezing or shortness of breath. Reported on 02/04/2016    . aspirin EC 81 MG tablet Take 1 tablet by mouth every morning.    Marland Kitchen atorvastatin (LIPITOR) 40 MG tablet Take 1 tablet (40 mg total) by mouth daily. 90 tablet 3  . Blood Glucose Monitoring Suppl (ACCU-CHEK AVIVA PLUS) w/Device KIT 1 Device by Does not apply route 2 (two) times daily. 1 kit 0  . calcium carbonate (OS-CAL) 600 MG TABS Take 600 mg by mouth 2 (two) times daily with a meal.    . diclofenac sodium (VOLTAREN) 1 % GEL Apply 1 application topically every other day.    Marland Kitchen glipiZIDE (GLUCOTROL) 10 MG tablet Take 10 mg by mouth 2 (two) times daily before a meal.    . glucose blood (ACCU-CHEK AVIVA) test strip Use as  instructed 100 each 12  . hydrochlorothiazide (HYDRODIURIL) 25 MG tablet Take 25 mg by mouth daily.    Marland Kitchen KLOR-CON M20 20 MEQ tablet Take 20 mEq by mouth daily.    . Lancets (ACCU-CHEK SOFT TOUCH) lancets Use as instructed 100 each 12  . metroNIDAZOLE (FLAGYL) 500 MG tablet Crush 1 tablet daily to use on open wound 30 tablet 3  . olmesartan (BENICAR) 40 MG tablet Take 40 mg by mouth daily.    . sitaGLIPtin (JANUVIA) 100 MG tablet Take 100 mg by mouth daily.     No current facility-administered medications for this visit.     OBJECTIVE: Older African-American woman Who appears stated age 1:   04/30/16 0813  BP: (!) 149/52  Pulse: (!) 57  Resp: 18  Temp: 97.7 F (36.5 C)     Body mass index is 29.65 kg/m.    ECOG FS:1 - Symptomatic but completely ambulatory  Sclerae unicteric, pupils round and equal Oropharynx clear and moist-- no thrush or other lesions No cervical or supraclavicular adenopathy Lungs no rales or rhonchi Heart regular  rate and rhythm Abd soft, nontender, positive bowel sounds MSK no focal spinal tenderness, no upper extremity lymphedema Neuro: nonfocal,  appropriate affect Breasts: Deferred; the right breast is imaged below   Photo right inframammary fold 04/14/2016    Photo dorsum right breast 04/14/2016       Right breast 03/05/2016     01/22/2016    LAB RESULTS:  CMP     Component Value Date/Time   NA 141 04/14/2016 0758   K 4.5 04/14/2016 0758   CL 103 02/13/2016 0939   CO2 23 04/14/2016 0758   GLUCOSE 148 (H) 04/14/2016 0758   BUN 16.4 04/14/2016 0758   CREATININE 1.1 04/14/2016 0758   CALCIUM 9.2 04/14/2016 0758   PROT 7.5 04/14/2016 0758   ALBUMIN 3.3 (L) 04/14/2016 0758   AST 23 04/14/2016 0758   ALT 10 04/14/2016 0758   ALKPHOS 62 04/14/2016 0758   BILITOT 0.37 04/14/2016 0758   GFRNONAA 42 (L) 02/13/2016 0939   GFRAA 48 (L) 02/13/2016 0939    INo results found for: SPEP, UPEP  Lab Results  Component Value Date   WBC 6.8 04/14/2016   NEUTROABS 3.0 04/14/2016   HGB 10.5 (L) 04/14/2016   HCT 32.7 (L) 04/14/2016   MCV 87.2 04/14/2016   PLT 271 04/14/2016      Chemistry      Component Value Date/Time   NA 141 04/14/2016 0758   K 4.5 04/14/2016 0758   CL 103 02/13/2016 0939   CO2 23 04/14/2016 0758   BUN 16.4 04/14/2016 0758   CREATININE 1.1 04/14/2016 0758      Component Value Date/Time   CALCIUM 9.2 04/14/2016 0758   ALKPHOS 62 04/14/2016 0758   AST 23 04/14/2016 0758   ALT 10 04/14/2016 0758   BILITOT 0.37 04/14/2016 0758       No results found for: LABCA2  No components found for: LABCA125  No results for input(s): INR in the last 168 hours.  Urinalysis    Component Value Date/Time   COLORURINE YELLOW 02/13/2016 1122   APPEARANCEUR CLEAR 02/13/2016 1122   LABSPEC 1.016 02/13/2016 1122   PHURINE 5.5 02/13/2016 1122   GLUCOSEU NEGATIVE 02/13/2016 1122   HGBUR NEGATIVE 02/13/2016 1122   BILIRUBINUR NEGATIVE 02/13/2016 1122    KETONESUR NEGATIVE 02/13/2016 1122   PROTEINUR NEGATIVE 02/13/2016 1122   UROBILINOGEN 1.0 11/28/2013 0143   NITRITE NEGATIVE 02/13/2016  [place] 02/13/2016 1122     STUDIES: Ct Chest W Contrast  Result Date: 04/12/2016 CLINICAL DATA:  Right breast cancer diagnosed July 2017 with ongoing oral chemotherapy, presenting for restaging. EXAM: CT CHEST WITH CONTRAST TECHNIQUE: Multidetector CT imaging of the chest was performed during intravenous contrast administration. CONTRAST:  166m ISOVUE-300 IOPAMIDOL (ISOVUE-300) INJECTION 61% COMPARISON:  01/27/2016 chest CT. FINDINGS: Cardiovascular: Normal heart size. No significant pericardial fluid/thickening. Left anterior descending and left circumflex coronary atherosclerosis. Atherosclerotic nonaneurysmal thoracic aorta. Normal caliber pulmonary arteries. No central pulmonary emboli. Mediastinum/Nodes: Bulky multinodular goiter with dominant 3.0 cm posterior right liver lobe nodule common not appreciably changed. Unremarkable esophagus. There are multiple enlarged right axillary nodes measuring up to 2.1 cm (series 2/ image 19), previously 2.1 cm, stable. A 1.9 x 1.5 cm mass in the lateral upper right breast (series 2/ image 40), previously 1.8 x 1.6 cm, not appreciably changed. No pathologically enlarged left axillary, mediastinal or hilar lymph nodes. Lungs/Pleura: No pneumothorax. No pleural effusion. Subpleural 0.9 cm anterior right middle lobe pulmonary nodule (series 5/image 83), stable. There are 3 mm pulmonary nodules in the right upper lobe (series 5/image 30), anterior left lower lobe (series 5/ image 107) and posterior left lower lobe (series 5/ image 94), all stable. No acute consolidative airspace disease, lung masses or new significant pulmonary nodules. Mild centrilobular emphysema. Upper abdomen: Unremarkable. Musculoskeletal: No aggressive appearing focal osseous lesions. Mild thoracic spondylosis. Irregular inframammary  fold right breast 5.8 x 4.8 cm mass (series 2/ image 100), previously 4.8 x 3.5 cm using similar measurement technique, increased in size. Diffuse skin thickening throughout the lower right breast appears grossly unchanged. IMPRESSION: 1. Irregular 5.8 cm right breast mass at the inframammary fold is increased in size, presumably representing growth of the primary right breast malignancy. Stable prominent skin thickening throughout the right lower breast. 2. Right axillary/upper outer right breast nodal metastases are stable. No new sites of nodal metastatic disease in the chest. 3. Four scattered pulmonary nodules are stable. No new significant pulmonary nodules. 4. Additional findings include aortic atherosclerosis, 2 vessel coronary atherosclerosis, mild centrilobular emphysema and stable bulky multinodular goiter. Electronically Signed   By: JIlona SorrelM.D.   On: 04/12/2016 12:35    ELIGIBLE FOR AVAILABLE RESEARCH PROTOCOL: no   ASSESSMENT: 82y.o. [place] woman status post right breast lower outer quadrant and right axillary lymph node biopsy 01/06/2016 both positive for a clinical T4 N1 M1, stage IV invasive ductal carcinoma, grade 3, triple negative, with an MIB-1 of 70%  (a) CT scan of the chest and bone scan 01/27/2016 showed scattered pulmonary nodules in addition to locally advanced disease  (b) repeat CT scan of the chest 04/12/2016 shows persistent pulmonary nodules, progression in local disease  (1) neoadjuvant chemotherapy consisting of capecitabine, 1500 mg twice daily, 7 days on 7 days off, starting 01/27/2016, discontinued 04/14/2016 with progression  (2) right modified radical mastectomy to follow   (3) adjuvant radiation to follow surgery  PLAN: RColliecontinues to do remarkably well given the extent of her disease. Today we reviewed the fact that metastatic breast cancer is not curable. Generally we reserve surgery for cases where we have documented response to systemic  therapy.  However Georgianna's case is different. Her metastatic disease is asymptomatic and it is the local spread that is causing her symptoms and which is likely to continue to cause her symptoms if we do not intervene.  Accordingly I agree that the best plan  is to proceed to aggressive local therapy and then switch to maintenance chemotherapy or immunotherapy once she recovers from her local treatment.  As far as that is concerned to options are eribulin, which would be given days 1 and 8 of each 21 day cycle, and pembrolizumab, it is available through a study, [facility] 1525. I will ask our research nurses to screen her for eligibility.  The tentative surgical date is November 7. I anticipate the patient will need 3 weeks or so to recover. Accordingly I have made an appointment for her to return to see me December 6. At that time hopefully we will be able to begin systemic therapy, either chemotherapy or immunotherapy.  The patient and her daughter have a good understanding of this plan. They agree with it. They know the goal of treatment in her case is control. They will call with any problems that may develop before the next visit  Jennifer Cruel, MD   04/30/2016 9:01 AM Medical Oncology and Hematology [facility] [place], [place] [address] Tel. [phone]    Fax. [phone]

## 2016-05-05 ENCOUNTER — Other Ambulatory Visit: Payer: Medicare Other

## 2016-05-05 ENCOUNTER — Ambulatory Visit: Payer: Medicare Other

## 2016-05-11 ENCOUNTER — Encounter: Payer: Self-pay | Admitting: Podiatry

## 2016-05-11 ENCOUNTER — Ambulatory Visit (INDEPENDENT_AMBULATORY_CARE_PROVIDER_SITE_OTHER): Payer: Medicare Other | Admitting: Podiatry

## 2016-05-11 VITALS — BP 149/55 | HR 57 | Resp 14 | Ht 62.0 in | Wt 162.0 lb

## 2016-05-11 DIAGNOSIS — M79609 Pain in unspecified limb: Secondary | ICD-10-CM

## 2016-05-11 DIAGNOSIS — B351 Tinea unguium: Secondary | ICD-10-CM | POA: Diagnosis not present

## 2016-05-11 DIAGNOSIS — M79676 Pain in unspecified toe(s): Secondary | ICD-10-CM | POA: Diagnosis not present

## 2016-05-11 NOTE — Progress Notes (Signed)
Patient ID: Jennifer Garrett, female   DOB: Sep 08, 1931, 82 y.o.   MRN: [id number] Complaint:  Visit Type: Patient returns to my office for continued preventative foot care services. Complaint: Patient states" my nails have grown long and thick and become painful to walk and wear shoes" Patient has been diagnosed with DM with no foot complications. The patient presents for preventative foot care services. No changes to ROS  Podiatric Exam: Vascular: dorsalis pedis and posterior tibial pulses are palpable bilateral. Capillary return is immediate. Temperature gradient is WNL. Skin turgor WNL  Sensorium: Decreased  Semmes Weinstein monofilament test. Normal tactile sensation bilaterally. Nail Exam: Pt has thick disfigured discolored nails with subungual debris noted bilateral entire nail hallux through fifth toenails Ulcer Exam: There is no evidence of ulcer or pre-ulcerative changes or infection. Orthopedic Exam: Muscle tone and strength are WNL. No limitations in general ROM. No crepitus or effusions noted. Foot type and digits show no abnormalities. Bony prominences are unremarkable. Skin: No Porokeratosis. No infection or ulcers  Diagnosis:  Onychomycosis, , Pain in right toe, pain in left toes  Treatment & Plan Procedures and Treatment: Consent by patient was obtained for treatment procedures. The patient understood the discussion of treatment and procedures well. All questions were answered thoroughly reviewed. Debridement of mycotic and hypertrophic toenails, 1 through 5 bilateral and clearing of subungual debris. No ulceration, no infection noted.  Return Visit-Office Procedure: Patient instructed to return to the office for a follow up visit 3 months for continued evaluation and treatment.    Gardiner Barefoot DPM

## 2016-05-12 ENCOUNTER — Ambulatory Visit: Payer: Medicare Other

## 2016-05-12 ENCOUNTER — Ambulatory Visit: Payer: Medicare Other | Admitting: Oncology

## 2016-05-12 ENCOUNTER — Other Ambulatory Visit: Payer: Medicare Other

## 2016-05-14 NOTE — H&P (Signed)
Subjective:     Patient ID: Jennifer Garrett is a 82 y.o. female.  HPI  Patient of Drs. Magrinat and Dalbert Batman here for consultation wound coverage following anticipated right mastectomy. Patient presented to ED 11/2015 with mass breast and bleeding. US obtained read as possible abscess, referred to [facility]. MMG showed 5:00 region of the right breast a 4.1 cm mass with enlarged axillary adenopathy. US showed a 5.4 cm mass at the 5:00 position of the right breast, with at least 3 enlarged abnormal axillary lymph nodes, the largest measuring 3.7 cm. Biopsy of the right breast mass and one axillary lymph node showed both to be IDC, triple negative. She was started on neoadjuvant chemotherapy. Recent CT scan showed increase in size of mass from 4.8 cm to 5.8 cm. The right axillary lymph node is stable, no evidence of metastatic disease. Plan for port placement and right MRM. Dr. Dalbert Batman expects will not be able to close wound primarily following this.   PMH significant for DM (last HbA1c 7.0, on oral medications only) and CAD. Recent cardiac work up with no further recommendations/changes.  Accompanied by daughter with whom she lives; daughter doing wound care with non stick dressing and crushed antibiotic in wounds to deal with smell. Has not required any transfusions. Daughter states she eats well, gained a few pounds while on chemotherapy.  Review of Systems  HENT: Positive for sinus pressure.   Respiratory: Positive for chest tightness.   Endocrine: Positive for cold intolerance and heat intolerance.  Psychiatric/Behavioral: Positive for sleep disturbance.  Remainder 12 point review negative     Objective:   Physical Exam  Cardiovascular: Normal rate, regular rhythm and normal heart sounds.   Pulmonary/Chest: Effort normal and breath sounds normal.  Abdominal: Soft.  Low midline scar from hysterectomy   BREAST: left breast without masses. Right breast indurated, mass encompassing majority  breast, ulcerations present at [facility] and superior pole with bleeding     Assessment:     Right breast cancer ulcerated S/p neoadjuvant chemotherapy    Plan:     Discussed options for anticipated open wound post MRM including local wound care- counseled this would be prolonged and may delay adjuvant treatments. Discussed STSG- reviewed donor site pain, healing, use of VAC as bolster for 5-7 days, patch like appearance, partial take graft, need for additional procedures. Discussed that a STSG will experience break down more readily with radiation. Discussed local thoracoabdominal flaps- counseled may tolerate adjuvant radiation better but may not be sufficient amount to cover wound entirely and may still require STSG for wound or donor site. Discussed latissimus flap- again difficult to tell until surgery whether back skin paddle could cover entire wound but if it cannot will still need skin graft over open areas. Reviewed this would also be a larger surgery, more recovery.   At this time will plan STSG, possible thoracoabdominal fasciocutaneous flaps. Reviewed expected hospital stay- if needed will arrange for home VAC. Dr. Dalbert Batman has discussed possibility SNF post op.     Irene Limbo, MD [facility] Plastic & Reconstructive Surgery [phone], pin [phone]

## 2016-05-18 NOTE — Pre-Procedure Instructions (Signed)
Jennifer Garrett  05/18/2016      [id number] [place], [place] - [place] [address] Phone: [phone] Fax: [place], [place] - [address] [place] [address] Phone: [phone] Fax: [phone]    Your procedure is scheduled on November 7  Report to [place] at [place].M.  Call this number if you have problems the morning of surgery:  [phone]   Remember:  Do not eat food or drink liquids after midnight.   Take these medicines the morning of surgery with A SIP OF WATER albuterol (PROVENTIL HFA;VENTOLIN HFA), cephALEXin (KEFLEX), olmesartan (BENICAR)  7 days prior to surgery STOP taking any Aspirin, Aleve, Naproxen, Ibuprofen, Motrin, Advil, Goody's, BC's, all herbal medications, fish oil, and all vitamins  WHAT DO I DO ABOUT MY DIABETES MEDICATION?   Marland Kitchen Do not take oral diabetes medicines (pills) the morning of surgery. glipiZIDE (GLUCOTROL)   How to Manage Your Diabetes Before and After Surgery  Why is it important to control my blood sugar before and after surgery? . Improving blood sugar levels before and after surgery helps healing and can limit problems. . A way of improving blood sugar control is eating a healthy diet by: o  Eating less sugar and carbohydrates o  Increasing activity/exercise o  Talking with your doctor about reaching your blood sugar goals . High blood sugars (greater than 180 mg/dL) can raise your risk of infections and slow your recovery, so you will need to focus on controlling your diabetes during the weeks before surgery. . Make sure that the doctor who takes care of your diabetes knows about your planned surgery including the date and location.  How do I manage my blood sugar before surgery? . Check your blood sugar at least 4 times a day, starting 2 days before surgery, to make sure  that the level is not too high or low. o Check your blood sugar the morning of your surgery when you wake up and every 2 hours until you get to the Short Stay unit. . If your blood sugar is less than 70 mg/dL, you will need to treat for low blood sugar: o Do not take insulin. o Treat a low blood sugar (less than 70 mg/dL) with  cup of clear juice (cranberry or apple), 4 glucose tablets, OR glucose gel. o Recheck blood sugar in 15 minutes after treatment (to make sure it is greater than 70 mg/dL). If your blood sugar is not greater than 70 mg/dL on recheck, call [phone] for further instructions. . Report your blood sugar to the short stay nurse when you get to Short Stay.  . If you are admitted to the hospital after surgery: o Your blood sugar will be checked by the staff and you will probably be given insulin after surgery (instead of oral diabetes medicines) to make sure you have good blood sugar levels. o The goal for blood sugar control after surgery is 80-180 mg/dL.     Do not wear jewelry, make-up or nail polish.  Do not wear lotions, powders, or perfumes, or deoderant.  Do not shave 48 hours prior to surgery.    Do not bring valuables to the hospital.  [facility] is not responsible for any belongings or valuables.  Contacts, dentures or bridgework may not be worn into surgery.  Leave your suitcase in the car.  After surgery it may be brought to your room.  For patients admitted to the hospital, discharge time will be determined by your treatment team.  Patients discharged the day of surgery will not be allowed to drive home.   Special instructions:   [facility]- Preparing For Surgery  Before surgery, you can play an important role. Because skin is not sterile, your skin needs to be as free of germs as possible. You can reduce the number of germs on your skin by washing with CHG (chlorahexidine gluconate) Soap before surgery.  CHG is an antiseptic cleaner which kills germs  and bonds with the skin to continue killing germs even after washing.  Please do not use if you have an allergy to CHG or antibacterial soaps. If your skin becomes reddened/irritated stop using the CHG.  Do not shave (including legs and underarms) for at least 48 hours prior to first CHG shower. It is OK to shave your face.  Please follow these instructions carefully.   1. Shower the NIGHT BEFORE SURGERY and the MORNING OF SURGERY with CHG.   2. If you chose to wash your hair, wash your hair first as usual with your normal shampoo.  3. After you shampoo, rinse your hair and body thoroughly to remove the shampoo.  4. Use CHG as you would any other liquid soap. You can apply CHG directly to the skin and wash gently with a scrungie or a clean washcloth.   5. Apply the CHG Soap to your body ONLY FROM THE NECK DOWN.  Do not use on open wounds or open sores. Avoid contact with your eyes, ears, mouth and genitals (private parts). Wash genitals (private parts) with your normal soap.  6. Wash thoroughly, paying special attention to the area where your surgery will be performed.  7. Thoroughly rinse your body with warm water from the neck down.  8. DO NOT shower/wash with your normal soap after using and rinsing off the CHG Soap.  9. Pat yourself dry with a CLEAN TOWEL.   10. Wear CLEAN PAJAMAS   11. Place CLEAN SHEETS on your bed the night of your first shower and DO NOT SLEEP WITH PETS.    Day of Surgery: Do not apply any deodorants/lotions. Please wear clean clothes to the hospital/surgery center.      Please read over the following fact sheets that you were given.

## 2016-05-19 ENCOUNTER — Encounter (HOSPITAL_COMMUNITY): Payer: Self-pay

## 2016-05-19 ENCOUNTER — Encounter (HOSPITAL_COMMUNITY)
Admission: RE | Admit: 2016-05-19 | Discharge: 2016-05-19 | Disposition: A | Discharge status: Home / Self Care | Payer: Medicare Other | Source: Ambulatory Visit | Attending: General Surgery | Admitting: General Surgery

## 2016-05-19 DIAGNOSIS — C50911 Malignant neoplasm of unspecified site of right female breast: Secondary | ICD-10-CM | POA: Diagnosis not present

## 2016-05-19 DIAGNOSIS — Z01812 Encounter for preprocedural laboratory examination: Secondary | ICD-10-CM | POA: Insufficient documentation

## 2016-05-19 HISTORY — DX: Personal history of peptic ulcer disease: Z87.11

## 2016-05-19 HISTORY — DX: Personal history of other diseases of the digestive system: Z87.19

## 2016-05-19 HISTORY — DX: Cardiac arrhythmia, unspecified: I49.9

## 2016-05-19 LAB — CBC WITH DIFFERENTIAL/PLATELET
BASOS ABS: 0.1 10*3/uL (ref 0.0–0.1)
Basophils Relative: 1 %
EOS PCT: 4 %
Eosinophils Absolute: 0.2 10*3/uL (ref 0.0–0.7)
HCT: 34.4 % — ABNORMAL LOW (ref 36.0–46.0)
HEMOGLOBIN: 11 g/dL — AB (ref 12.0–15.0)
LYMPHS PCT: 46 %
Lymphs Abs: 3 10*3/uL (ref 0.7–4.0)
MCH: 27.9 pg (ref 26.0–34.0)
MCHC: 32 g/dL (ref 30.0–36.0)
MCV: 87.3 fL (ref 78.0–100.0)
Monocytes Absolute: 0.6 10*3/uL (ref 0.1–1.0)
Monocytes Relative: 9 %
NEUTROS ABS: 2.6 10*3/uL (ref 1.7–7.7)
NEUTROS PCT: 40 %
PLATELETS: 284 10*3/uL (ref 150–400)
RBC: 3.94 MIL/uL (ref 3.87–5.11)
RDW: 13.6 % (ref 11.5–15.5)
WBC: 6.4 10*3/uL (ref 4.0–10.5)

## 2016-05-19 LAB — URINALYSIS, ROUTINE W REFLEX MICROSCOPIC
BILIRUBIN URINE: NEGATIVE
Glucose, UA: NEGATIVE mg/dL
HGB URINE DIPSTICK: NEGATIVE
Ketones, ur: NEGATIVE mg/dL
Leukocytes, UA: NEGATIVE
Nitrite: NEGATIVE
PH: 5.5 (ref 5.0–8.0)
Protein, ur: NEGATIVE mg/dL
SPECIFIC GRAVITY, URINE: 1.017 (ref 1.005–1.030)

## 2016-05-19 LAB — COMPREHENSIVE METABOLIC PANEL
ALK PHOS: 50 U/L (ref 38–126)
ALT: 13 U/L — AB (ref 14–54)
AST: 32 U/L (ref 15–41)
Albumin: 3.6 g/dL (ref 3.5–5.0)
Anion gap: 9 (ref 5–15)
BUN: 12 mg/dL (ref 6–20)
CHLORIDE: 108 mmol/L (ref 101–111)
CO2: 23 mmol/L (ref 22–32)
CREATININE: 0.95 mg/dL (ref 0.44–1.00)
Calcium: 9.5 mg/dL (ref 8.9–10.3)
GFR calc Af Amer: >60 mL/min (ref >60)
GFR, EST NON AFRICAN AMERICAN: 54 mL/min — AB (ref >60)
Glucose, Bld: 167 mg/dL — ABNORMAL HIGH (ref 65–99)
Potassium: 3.8 mmol/L (ref 3.5–5.1)
Sodium: 140 mmol/L (ref 135–145)
Total Bilirubin: 0.4 mg/dL (ref 0.3–1.2)
Total Protein: 7.3 g/dL (ref 6.5–8.1)

## 2016-05-19 LAB — GLUCOSE, CAPILLARY: Glucose-Capillary: 188 mg/dL — ABNORMAL HIGH (ref 65–99)

## 2016-05-19 NOTE — Progress Notes (Signed)
PCP - Thedora Hinders Cardiologist - denies  Chest x-ray - not needed EKG - 02/13/16 abnormal Stress Test -  02/27/16 ECHO - denies Cardiac Cath - 2003 - stent placed  Will send to anesthesia for review of ekg  Fasting blood sugar is 105 and usually checks it one time a day    Patient denies shortness of breath, fever, cough and chest pain at PAT appointment

## 2016-05-20 LAB — HEMOGLOBIN A1C
HEMOGLOBIN A1C: 7.9 % — AB (ref 4.8–5.6)
Mean Plasma Glucose: 180 mg/dL

## 2016-05-20 NOTE — Progress Notes (Signed)
Anesthesia chart review: Patient is an 82 year old female scheduled for right modified radical mastectomy (Dr. Dalbert Batman) and local fasciocutaneous flap from abdomen to right chest and skin graft from right thigh to right chest and abdomen (Dr. Iran Planas) on 05/25/2016. She presented to the ED in 11/2015 with a right breast abscess and ultimately found to have invasive ductal carcinoma (felt likely stage IV with lung nodules concerning for metastatic disease). S/P neoadjuvant chemotherapy (discontiued 04/14/16 with progression). Plan for radical mastectomy followed by adjuvant radiation.   History includes right breast cancer, CAD s/p Cypher stent OM3 '03, asthma, DM2, HLD, HTN, hysterectomy, former smoker.   - PCP is Dr. Nolene Ebbs. - HEM-ONC is Dr. Jana Hakim. - She has known CAD without recent follow-up, so she was referred to cardiologist Dr. Minus Breeding for preoperative evaluation and was felt "acceptable risk" for planned surgery following recent stress test.  Meds include albuterol, aspirin 81 mg, Lipitor, Keflex, Aricept, glipizide, HCTZ, KCl, Flagyl, Benicar.  BP (!) 151/58   Pulse 67   Temp 36.5 C   Resp 18   Ht 5' 2.5" (1.588 m)   Wt 162 lb 6.4 oz (73.7 kg)   SpO2 96%   BMI 29.23 kg/m   02/13/16 EKG: SR, LVH with IVCD, LAD, and secondary repolarization abnormality. Artifact in leads 1, II, II, aVR, aVL, aVF.  02/27/16 Nuclear stress test:  The left ventricular ejection fraction is mildly decreased (45-54%).  Nuclear stress EF: 54%.  There was no ST segment deviation noted during stress.  Defect 1: There is a small defect of severe severity present in the apex location.  This is a low risk study.  Low risk stress nuclear study with prominent apical thinning vs small prior infarct; no ischemia; EF 54 with normal wall motion.  05/09/02 Cardiac cath (Dr. Einar Gip): IMPRESSION: 1. An 80% angiographic stenosis of the obtuse marginal #3 branch of the circumflex coronary artery.   The circumflex is a dominant coronary artery. 2. Hazy tandem lesions in the proximal and mid left anterior descending artery constituting 20-30% stenosis. 3. The right coronary artery is a nondominant vessel. The mid segment has 80% stenosis. 4. Normal left ventricular systolic function.  Ejection fraction 60%. INTERVENTIONAL DATA:  PTCA and stenting of the obtuse marginal #3 branch of the circumflex coronary artery with a 2.5 x 13-mm CYPHER stent. Post PCI IVUS interrogation of the obtuse marginal revealed adequate stent deployment and apposition to the wall. There was no evidence of aortic stenosis.  04/12/16 Chest CT: IMPRESSION: 1. Irregular 5.8 cm right breast mass at the inframammary fold is increased in size, presumably representing growth of the primary right breast malignancy. Stable prominent skin thickening throughout the right lower breast. 2. Right axillary/upper outer right breast nodal metastases are stable. No new sites of nodal metastatic disease in the chest. 3. Four scattered pulmonary nodules are stable. No new significant pulmonary nodules. 4. Additional findings include aortic atherosclerosis, 2 vessel coronary atherosclerosis, mild centrilobular emphysema and stable bulky multinodular goiter.  Preoperative labs noted. Cr 0.95, glucose 167, H/H 11.0/34.4. A1c 7.9. UA WNL.  Patient has advanced right breast cancer. She was recently cleared for surgery with acceptable CV risk. If no acute changes then I would anticipate that she can proceed as planned.  Aden Hugh [facility] Short Stay Center/Anesthesiology Phone [phone] 05/20/2016 12:41 PM

## 2016-05-24 NOTE — H&P (Signed)
Jennifer Garrett Location: [name] Patient #: [id number] DOB: February 21, 1932 Widowed / Language: [name] / Race: Black or African American Female        History of Present Illness   The patient is a 82 year old female who presents with breast cancer. This is a 82 year old African female who returns to the office with her daughter to make final plans for scheduling her right mastectomy. Doctor Avbuere is her PCP. Dr. Jana Hakim is her oncologist. Dr. Percival Spanish is her cardiologist. Dr. Audelia Acton is her neurologist.  She initially presented in January 12, 2016 with a neglected breast cancer with an ulcerated mass at 6 o'clock position and some satellite nodules as well the mass was biopsied and showed invasive adenocarcinoma, triple negative breast cancer. Right axillary masses biopsied and showed invasive carcinoma and a lymph node, also triple negative. Stage TIV, N1, stage IIIB. She received neoadjuvant chemotherapy since that time and has progressed. She has developed some satellite nodules in the central and lower breast as well as the skin below the inframammary crease. These are draining. Dr. Jana Hakim referred her back to proceed with mastectomy, thinking that was preferable to intensifying her chemotherapy.  Significant comorbidities include hypertension, non-insulin-dependent diabetes, asthma, hyperlipidemia. Cerebrovascular accident earlier this year treated as outpatient. She has memory and gait problems with does walk with a cane. She has fallen. Past history coronary artery disease. Recently evaluated by Dr. Percival Spanish with stress testing thought to be low risk for surgical procedure. Remote abdominal hysterectomy but does not know whether her ovaries were removed. Cardiac catheterization and angioplasty with stent in the past by Dr. Einar Gip.  Family history is negative for breast, ovarian, pancreatic, or colon cancer.  Nuclear medicine cardiac study is low risk with a  fixed defect. Stated to be acceptable risk for planned surgical procedure. CT scan head with contrast on February 13, 2016 shows mild atrophy with no focal or acute problems. CT chest dated April 12, 2016 shows right breast mass, increased in size. Right axillary metastasis are stable. No new sites of nodal metastatic disease in the chest. 4 scattered pulmonary nodules which are felt to be stable. No new significant pulmonary nodules.  She has been seeing Dr. Iran Planas who feels that she will at least tolerate skin grafting, possible abdominal rotation flap, less likely latissimus flap. I think the tumor on her chest wall is resectable because it is mobile but I do not think we can achieve primary closure. I have discussed operative plan with Dr.Thimmappa and we are ready to proceed.  The patient and her daughter are willing to undergo the surgery. She'll be scheduled for right modified radical mastectomy with immediate reconstruction and skin coverage. I discussed the indications, details, techniques, and numerous risk of the surgery. They're aware the risk of bleeding, infection, arm numbness, arm swelling, shoulder disability, worsening of her mental status, stroke, myocardial infarction, pulmonary and thromboembolic problems. The daughter understands that the surgery is more likely palliative and likely not curative due to its metastatic spread to lymph nodes and advanced local stage. She knows that we may get positive skin margins. She knows that she is at increased risk for wound breakdown and complications. She knows we are not sure she would tolerate radiation therapy. They understand these issues well. All of his questions are answered. They agree with this plan.   Allergies  Piroxicam *ANALGESICS - ANTI-INFLAMMATORY* Pregabalin *CHEMICALS* Lisinopril *CHEMICALS* Doxycycline *DERMATOLOGICALS* Ketoprofen *CHEMICALS*  Medication History Benicar (40MG  Tablet, Oral)  Active. Clindamycin  HCl (300MG  Capsule, Oral) Active. GlipiZIDE (10MG  Tablet, Oral) Active. HydroCHLOROthiazide (12.5MG  Capsule, Oral) Active. Januvia (100MG  Tablet, Oral) Active. Simvastatin (20MG  Tablet, Oral) Active. Voltaren (1% Gel, Transdermal) Active. Aspirin (81MG  Tablet, Oral) Active. Calcium 600 (1500 (600 Ca)MG Tablet, Oral) Active. Albuterol Sulfate HFA (108MCG/ACT Aerosol Soln, Inhalation) Active. Medications Reconciled  Vitals  Weight: 161 lb Height: 62.5in Body Surface Area: 1.75 m Body Mass Index: 28.98 kg/m  Temp.: 30F  BP: 130/70 (Sitting, Left Arm, Standard)    Physical Exam  General Mental Status-Alert. General Appearance-Not in acute distress. Build & Nutrition-Well nourished. Posture-Normal posture. Gait-Normal. Note: Ambulates. knows that she has a problem in her right breast but doesn't have a lot of insight. She knows she needs surgery and that the chemotherapy did not work. Family member with her throughout the encounter   Head and Neck Head-normocephalic, atraumatic with no lesions or palpable masses. Trachea-midline. Thyroid Gland Characteristics - normal size and consistency and no palpable nodules.  Chest and Lung Exam Chest and lung exam reveals -on auscultation, normal breath sounds, no adventitious sounds and normal vocal resonance.  Breast Note: Left breast is soft and with no palpable mass. Right breast reveals a 5 cm ulcerated mass at the end inframammary crease midaxillary line. There is a satellite ulcerated nodule below and medial to this. There are several ulcerated satellite nodules in the mid and inferior breast skin. The superior right breast appears uninvolved. All this is mobile and does not appear to be fixed to the chest wall. There is a palpable mobile 3 cm node in the right axilla.   Cardiovascular Cardiovascular examination reveals -normal heart sounds, regular rate and rhythm  with no murmurs and femoral artery auscultation bilaterally reveals normal pulses, no bruits, no thrills.  Abdomen Inspection Inspection of the abdomen reveals - No Hernias. Palpation/Percussion Palpation and Percussion of the abdomen reveal - Soft, Non Tender, No Rigidity (guarding), No hepatosplenomegaly and No Palpable abdominal masses.  Neurologic Note: Ambulates independently. Can get up on the exam table independently. Can carry on a simple conversation. I understand she has a right breast disease that needs surgery. Obvious impairment of recent and remote memory. No tremor   Musculoskeletal Normal Exam - Bilateral-Upper Extremity Strength Normal and Lower Extremity Strength Normal.    Assessment & Plan  PRIMARY MALIGNANT NEOPLASM OF RIGHT BREAST WITH METASTASIS TO MOVABLE IPSILATERAL LEVEL 1 OR 2 AXILLARY LYMPH NODES (N1) (C50.911) .   You have now seen the plastic surgeon, Dr. Iran Planas. You and your family state that you are ready to proceed with right mastectomy and immediate reconstruction and skin coverage. We will need to be sure that your cardiologist, Dr. Percival Spanish, gives an opinion about the risk of this surgery  you'll be scheduled for right modified radical mastectomy with immediate reconstruction and coverage of the defect I discussed the indications, details, techniques, and numerous risk of the surgery with you and your family. You have agreed with this plan I will set this up as quickly as possible.  MEMORY DISORDER (R41.3) TYPE 2 DIABETES MELLITUS TREATED WITHOUT INSULIN (E11.9) ASTHMA, MODERATE (J45.909) CORONARY ARTERY DISEASE, OCCLUSIVE (I25.10) HISTORY OF CEREBROVASCULAR ACCIDENT (Z86.73) HYPERTENSION, ESSENTIAL (I10) HYPERLIPIDEMIA, ACQUIRED (E78.5)    Jaemarie Hochberg M. Dalbert Batman, M.D., [facility] General and Minimally invasive Surgery Breast and Colorectal Surgery Office:   [phone] Pager:   [phone]

## 2016-05-25 ENCOUNTER — Encounter (HOSPITAL_COMMUNITY)
Admission: RE | Disposition: A | Discharge status: Home Health | Payer: Self-pay | Source: Ambulatory Visit | Attending: General Surgery

## 2016-05-25 ENCOUNTER — Inpatient Hospital Stay (HOSPITAL_COMMUNITY)
Admission: RE | Admit: 2016-05-25 | Discharge: 2016-05-27 | DRG: 580 | Disposition: A | Discharge status: Home Health | Payer: Medicare Other | Source: Ambulatory Visit | Attending: General Surgery | Admitting: General Surgery

## 2016-05-25 ENCOUNTER — Encounter (HOSPITAL_COMMUNITY): Payer: Self-pay | Admitting: *Deleted

## 2016-05-25 ENCOUNTER — Ambulatory Visit (HOSPITAL_COMMUNITY): Payer: Medicare Other | Admitting: Vascular Surgery

## 2016-05-25 ENCOUNTER — Ambulatory Visit (HOSPITAL_COMMUNITY): Payer: Medicare Other | Admitting: Anesthesiology

## 2016-05-25 DIAGNOSIS — C50311 Malignant neoplasm of lower-inner quadrant of right female breast: Principal | ICD-10-CM | POA: Diagnosis present

## 2016-05-25 DIAGNOSIS — Z79899 Other long term (current) drug therapy: Secondary | ICD-10-CM

## 2016-05-25 DIAGNOSIS — R918 Other nonspecific abnormal finding of lung field: Secondary | ICD-10-CM | POA: Diagnosis present

## 2016-05-25 DIAGNOSIS — Z7982 Long term (current) use of aspirin: Secondary | ICD-10-CM | POA: Diagnosis not present

## 2016-05-25 DIAGNOSIS — Z8673 Personal history of transient ischemic attack (TIA), and cerebral infarction without residual deficits: Secondary | ICD-10-CM

## 2016-05-25 DIAGNOSIS — C50919 Malignant neoplasm of unspecified site of unspecified female breast: Secondary | ICD-10-CM

## 2016-05-25 DIAGNOSIS — I10 Essential (primary) hypertension: Secondary | ICD-10-CM | POA: Diagnosis present

## 2016-05-25 DIAGNOSIS — E119 Type 2 diabetes mellitus without complications: Secondary | ICD-10-CM | POA: Diagnosis present

## 2016-05-25 DIAGNOSIS — I471 Supraventricular tachycardia: Secondary | ICD-10-CM | POA: Diagnosis present

## 2016-05-25 DIAGNOSIS — I2510 Atherosclerotic heart disease of native coronary artery without angina pectoris: Secondary | ICD-10-CM | POA: Diagnosis present

## 2016-05-25 DIAGNOSIS — Z171 Estrogen receptor negative status [ER-]: Secondary | ICD-10-CM

## 2016-05-25 DIAGNOSIS — Z7984 Long term (current) use of oral hypoglycemic drugs: Secondary | ICD-10-CM

## 2016-05-25 DIAGNOSIS — D62 Acute posthemorrhagic anemia: Secondary | ICD-10-CM | POA: Diagnosis present

## 2016-05-25 DIAGNOSIS — E785 Hyperlipidemia, unspecified: Secondary | ICD-10-CM | POA: Diagnosis present

## 2016-05-25 DIAGNOSIS — Z9221 Personal history of antineoplastic chemotherapy: Secondary | ICD-10-CM

## 2016-05-25 DIAGNOSIS — C50811 Malignant neoplasm of overlapping sites of right female breast: Secondary | ICD-10-CM

## 2016-05-25 DIAGNOSIS — R748 Abnormal levels of other serum enzymes: Secondary | ICD-10-CM | POA: Diagnosis not present

## 2016-05-25 DIAGNOSIS — R34 Anuria and oliguria: Secondary | ICD-10-CM | POA: Diagnosis present

## 2016-05-25 DIAGNOSIS — R9431 Abnormal electrocardiogram [ECG] [EKG]: Secondary | ICD-10-CM

## 2016-05-25 DIAGNOSIS — Z8711 Personal history of peptic ulcer disease: Secondary | ICD-10-CM

## 2016-05-25 DIAGNOSIS — C773 Secondary and unspecified malignant neoplasm of axilla and upper limb lymph nodes: Secondary | ICD-10-CM | POA: Diagnosis present

## 2016-05-25 DIAGNOSIS — J45909 Unspecified asthma, uncomplicated: Secondary | ICD-10-CM | POA: Diagnosis present

## 2016-05-25 DIAGNOSIS — Z8249 Family history of ischemic heart disease and other diseases of the circulatory system: Secondary | ICD-10-CM | POA: Diagnosis not present

## 2016-05-25 HISTORY — PX: MASTECTOMY MODIFIED RADICAL: SHX5962

## 2016-05-25 HISTORY — DX: Malignant neoplasm of unspecified site of unspecified female breast: C50.919

## 2016-05-25 HISTORY — PX: LESION EXCISION WITH COMPLEX REPAIR: SHX6700

## 2016-05-25 LAB — GLUCOSE, CAPILLARY
GLUCOSE-CAPILLARY: 85 mg/dL (ref 65–99)
GLUCOSE-CAPILLARY: 148 mg/dL — AB (ref 65–99)
Glucose-Capillary: 138 mg/dL — ABNORMAL HIGH (ref 65–99)
Glucose-Capillary: 189 mg/dL — ABNORMAL HIGH (ref 65–99)

## 2016-05-25 LAB — SURGICAL PCR SCREEN
MRSA, PCR: NEGATIVE
Staphylococcus aureus: NEGATIVE

## 2016-05-25 LAB — CBC
HCT: 28.1 % — ABNORMAL LOW (ref 36.0–46.0)
HEMOGLOBIN: 9.2 g/dL — AB (ref 12.0–15.0)
MCH: 28 pg (ref 26.0–34.0)
MCHC: 32.7 g/dL (ref 30.0–36.0)
MCV: 85.7 fL (ref 78.0–100.0)
PLATELETS: ADEQUATE 10*3/uL (ref 150–400)
RBC: 3.28 MIL/uL — ABNORMAL LOW (ref 3.87–5.11)
RDW: 13.3 % (ref 11.5–15.5)
WBC: 14.6 10*3/uL — ABNORMAL HIGH (ref 4.0–10.5)

## 2016-05-25 LAB — ABO/RH: ABO/RH(D): A POS

## 2016-05-25 LAB — PREPARE RBC (CROSSMATCH)

## 2016-05-25 LAB — CK TOTAL AND CKMB (NOT AT ARMC)
CK TOTAL: 206 U/L (ref 38–234)
CK, MB: 2.2 ng/mL (ref 0.5–5.0)
Relative Index: 1.1 (ref 0.0–2.5)

## 2016-05-25 LAB — CREATININE, SERUM
CREATININE: 0.85 mg/dL (ref 0.44–1.00)
GFR calc Af Amer: >60 mL/min (ref >60)

## 2016-05-25 LAB — TROPONIN I: TROPONIN I: 0.07 ng/mL — AB (ref <0.03)

## 2016-05-25 LAB — MRSA PCR SCREENING: MRSA BY PCR: NEGATIVE

## 2016-05-25 SURGERY — MASTECTOMY, MODIFIED RADICAL
Anesthesia: General | Site: Chest | Laterality: Right

## 2016-05-25 MED ORDER — POTASSIUM CHLORIDE CRYS ER 20 MEQ PO TBCR
20.0000 meq | EXTENDED_RELEASE_TABLET | Freq: Every day | ORAL | Status: DC
  Administered 2016-05-25 – 2016-05-27 (×3): 20 meq via ORAL
  Filled 2016-05-25 (×3): qty 1

## 2016-05-25 MED ORDER — CALCIUM CARBONATE 1500 (600 CA) MG PO TABS
600.0000 mg | ORAL_TABLET | Freq: Two times a day (BID) | ORAL | Status: DC
  Filled 2016-05-25 (×2): qty 1

## 2016-05-25 MED ORDER — FENTANYL CITRATE (PF) 100 MCG/2ML IJ SOLN
INTRAMUSCULAR | Status: AC
  Filled 2016-05-25: qty 4

## 2016-05-25 MED ORDER — FENTANYL CITRATE (PF) 100 MCG/2ML IJ SOLN: 25.0000 ug | INTRAMUSCULAR | Status: DC | PRN

## 2016-05-25 MED ORDER — SODIUM CHLORIDE 0.9 % IV SOLN: Freq: Once | INTRAVENOUS | Status: DC

## 2016-05-25 MED ORDER — ACETAMINOPHEN 500 MG PO TABS
1000.0000 mg | ORAL_TABLET | ORAL | Status: AC
  Administered 2016-05-25: 1000 mg via ORAL
  Filled 2016-05-25: qty 2

## 2016-05-25 MED ORDER — SUGAMMADEX SODIUM 200 MG/2ML IV SOLN
INTRAVENOUS | Status: AC
  Filled 2016-05-25: qty 2

## 2016-05-25 MED ORDER — ONDANSETRON 4 MG PO TBDP: 4.0000 mg | ORAL_TABLET | Freq: Four times a day (QID) | ORAL | Status: DC | PRN

## 2016-05-25 MED ORDER — ALBUTEROL SULFATE (2.5 MG/3ML) 0.083% IN NEBU: 2.5000 mg | INHALATION_SOLUTION | Freq: Four times a day (QID) | RESPIRATORY_TRACT | Status: DC | PRN

## 2016-05-25 MED ORDER — METHOCARBAMOL 500 MG PO TABS: 500.0000 mg | ORAL_TABLET | Freq: Four times a day (QID) | ORAL | Status: DC | PRN

## 2016-05-25 MED ORDER — OXYCODONE HCL 5 MG/5ML PO SOLN: 5.0000 mg | Freq: Once | ORAL | Status: DC | PRN

## 2016-05-25 MED ORDER — GABAPENTIN 300 MG PO CAPS
300.0000 mg | ORAL_CAPSULE | ORAL | Status: AC
  Administered 2016-05-25: 300 mg via ORAL
  Filled 2016-05-25: qty 1

## 2016-05-25 MED ORDER — IRBESARTAN 75 MG PO TABS
37.5000 mg | ORAL_TABLET | Freq: Every day | ORAL | Status: DC
  Administered 2016-05-25 – 2016-05-27 (×3): 37.5 mg via ORAL
  Filled 2016-05-25: qty 1
  Filled 2016-05-25 (×2): qty 0.5

## 2016-05-25 MED ORDER — ONDANSETRON HCL 4 MG/2ML IJ SOLN: 4.0000 mg | Freq: Four times a day (QID) | INTRAMUSCULAR | Status: DC | PRN

## 2016-05-25 MED ORDER — MINERAL OIL LIGHT 100 % EX OIL
TOPICAL_OIL | CUTANEOUS | Status: AC
  Filled 2016-05-25: qty 25

## 2016-05-25 MED ORDER — FENTANYL CITRATE (PF) 100 MCG/2ML IJ SOLN
INTRAMUSCULAR | Status: AC
  Filled 2016-05-25: qty 2

## 2016-05-25 MED ORDER — ESMOLOL HCL 100 MG/10ML IV SOLN
INTRAVENOUS | Status: AC
  Filled 2016-05-25: qty 20

## 2016-05-25 MED ORDER — LIDOCAINE 2% (20 MG/ML) 5 ML SYRINGE
INTRAMUSCULAR | Status: DC | PRN
  Administered 2016-05-25: 60 mg via INTRAVENOUS

## 2016-05-25 MED ORDER — LACTATED RINGERS IV SOLN
INTRAVENOUS | Status: DC | PRN
  Administered 2016-05-25 (×2): via INTRAVENOUS

## 2016-05-25 MED ORDER — FENTANYL CITRATE (PF) 100 MCG/2ML IJ SOLN
INTRAMUSCULAR | Status: DC | PRN
  Administered 2016-05-25 (×3): 50 ug via INTRAVENOUS
  Administered 2016-05-25: 100 ug via INTRAVENOUS
  Administered 2016-05-25: 50 ug via INTRAVENOUS

## 2016-05-25 MED ORDER — ATORVASTATIN CALCIUM 40 MG PO TABS
40.0000 mg | ORAL_TABLET | Freq: Every day | ORAL | Status: DC
  Administered 2016-05-25 – 2016-05-27 (×3): 40 mg via ORAL
  Filled 2016-05-25 (×3): qty 1

## 2016-05-25 MED ORDER — ONDANSETRON HCL 4 MG/2ML IJ SOLN
INTRAMUSCULAR | Status: AC
  Filled 2016-05-25: qty 2

## 2016-05-25 MED ORDER — PANTOPRAZOLE SODIUM 40 MG PO TBEC
40.0000 mg | DELAYED_RELEASE_TABLET | Freq: Every day | ORAL | Status: DC
  Administered 2016-05-25 – 2016-05-27 (×3): 40 mg via ORAL
  Filled 2016-05-25 (×3): qty 1

## 2016-05-25 MED ORDER — PROPOFOL 10 MG/ML IV BOLUS
INTRAVENOUS | Status: DC | PRN
  Administered 2016-05-25: 60 mg via INTRAVENOUS
  Administered 2016-05-25: 100 mg via INTRAVENOUS

## 2016-05-25 MED ORDER — CALCIUM CARBONATE 1500 (600 CA) MG PO TABS: 600.0000 mg | ORAL_TABLET | Freq: Two times a day (BID) | ORAL | Status: DC

## 2016-05-25 MED ORDER — CEFAZOLIN SODIUM-DEXTROSE 2-4 GM/100ML-% IV SOLN
2.0000 g | INTRAVENOUS | Status: AC
  Administered 2016-05-25: 2 g via INTRAVENOUS
  Filled 2016-05-25: qty 100

## 2016-05-25 MED ORDER — PHENYLEPHRINE HCL 10 MG/ML IJ SOLN
INTRAMUSCULAR | Status: DC | PRN
  Administered 2016-05-25: 20 ug/min via INTRAVENOUS

## 2016-05-25 MED ORDER — SENNA 8.6 MG PO TABS
1.0000 | ORAL_TABLET | Freq: Two times a day (BID) | ORAL | Status: DC
  Administered 2016-05-25 – 2016-05-27 (×4): 8.6 mg via ORAL
  Filled 2016-05-25 (×4): qty 1

## 2016-05-25 MED ORDER — LACTATED RINGERS IV SOLN
INTRAVENOUS | Status: DC
  Administered 2016-05-25 – 2016-05-26 (×3): via INTRAVENOUS
  Filled 2016-05-25: qty 1000

## 2016-05-25 MED ORDER — DONEPEZIL HCL 5 MG PO TABS
5.0000 mg | ORAL_TABLET | Freq: Every day | ORAL | Status: DC
  Administered 2016-05-26: 5 mg via ORAL
  Filled 2016-05-25 (×3): qty 1

## 2016-05-25 MED ORDER — CHLORHEXIDINE GLUCONATE CLOTH 2 % EX PADS
6.0000 | MEDICATED_PAD | Freq: Once | CUTANEOUS | Status: DC
Start: 2016-05-25 — End: 2016-05-25

## 2016-05-25 MED ORDER — CALCIUM CARBONATE 1500 (600 CA) MG PO TABS
600.0000 mg | ORAL_TABLET | Freq: Two times a day (BID) | ORAL | Status: DC
  Filled 2016-05-25: qty 1

## 2016-05-25 MED ORDER — SUGAMMADEX SODIUM 200 MG/2ML IV SOLN
INTRAVENOUS | Status: DC | PRN
  Administered 2016-05-25: 150 mg via INTRAVENOUS

## 2016-05-25 MED ORDER — ONDANSETRON HCL 4 MG/2ML IJ SOLN
INTRAMUSCULAR | Status: DC | PRN
  Administered 2016-05-25: 4 mg via INTRAVENOUS

## 2016-05-25 MED ORDER — OXYCODONE HCL 5 MG PO TABS: 5.0000 mg | ORAL_TABLET | Freq: Once | ORAL | Status: DC | PRN

## 2016-05-25 MED ORDER — HYDROCODONE-ACETAMINOPHEN 5-325 MG PO TABS
1.0000 | ORAL_TABLET | ORAL | Status: DC | PRN
Start: 2016-05-25 — End: 2016-05-27
  Administered 2016-05-25: 1 via ORAL
  Administered 2016-05-26: 2 via ORAL
  Administered 2016-05-26: 1 via ORAL
  Filled 2016-05-25: qty 2
  Filled 2016-05-25 (×3): qty 1

## 2016-05-25 MED ORDER — LIDOCAINE 2% (20 MG/ML) 5 ML SYRINGE
INTRAMUSCULAR | Status: AC
  Filled 2016-05-25: qty 5

## 2016-05-25 MED ORDER — PHENYLEPHRINE HCL 10 MG/ML IJ SOLN
INTRAMUSCULAR | Status: DC | PRN
  Administered 2016-05-25 (×2): 80 ug via INTRAVENOUS
  Administered 2016-05-25 (×2): 40 ug via INTRAVENOUS

## 2016-05-25 MED ORDER — ESMOLOL HCL 100 MG/10ML IV SOLN
INTRAVENOUS | Status: DC | PRN
  Administered 2016-05-25: 60 mg via INTRAVENOUS
  Administered 2016-05-25: 30 mg via INTRAVENOUS

## 2016-05-25 MED ORDER — LABETALOL HCL 5 MG/ML IV SOLN
INTRAVENOUS | Status: AC
  Filled 2016-05-25: qty 4

## 2016-05-25 MED ORDER — MORPHINE SULFATE (PF) 2 MG/ML IV SOLN
1.0000 mg | INTRAVENOUS | Status: DC | PRN
  Administered 2016-05-26: 1 mg via INTRAVENOUS
  Filled 2016-05-25: qty 1

## 2016-05-25 MED ORDER — PROPOFOL 10 MG/ML IV BOLUS
INTRAVENOUS | Status: AC
  Filled 2016-05-25: qty 20

## 2016-05-25 MED ORDER — ROCURONIUM BROMIDE 10 MG/ML (PF) SYRINGE
PREFILLED_SYRINGE | INTRAVENOUS | Status: DC | PRN
  Administered 2016-05-25: 10 mg via INTRAVENOUS
  Administered 2016-05-25: 50 mg via INTRAVENOUS

## 2016-05-25 MED ORDER — CALCIUM CARBONATE 1250 (500 CA) MG PO TABS
1.0000 | ORAL_TABLET | Freq: Two times a day (BID) | ORAL | Status: DC
  Administered 2016-05-26 – 2016-05-27 (×3): 500 mg via ORAL
  Filled 2016-05-25 (×3): qty 1

## 2016-05-25 MED ORDER — ENOXAPARIN SODIUM 40 MG/0.4ML ~~LOC~~ SOLN
40.0000 mg | SUBCUTANEOUS | Status: DC
  Administered 2016-05-26 – 2016-05-27 (×2): 40 mg via SUBCUTANEOUS
  Filled 2016-05-25 (×2): qty 0.4

## 2016-05-25 MED ORDER — ASPIRIN EC 81 MG PO TBEC
81.0000 mg | DELAYED_RELEASE_TABLET | Freq: Every morning | ORAL | Status: DC
  Administered 2016-05-26 – 2016-05-27 (×2): 81 mg via ORAL
  Filled 2016-05-25 (×2): qty 1

## 2016-05-25 MED ORDER — INSULIN ASPART 100 UNIT/ML ~~LOC~~ SOLN
0.0000 [IU] | Freq: Three times a day (TID) | SUBCUTANEOUS | Status: DC
  Administered 2016-05-26: 5 [IU] via SUBCUTANEOUS
  Administered 2016-05-26: 3 [IU] via SUBCUTANEOUS
  Administered 2016-05-27: 2 [IU] via SUBCUTANEOUS

## 2016-05-25 MED ORDER — ALBUTEROL SULFATE HFA 108 (90 BASE) MCG/ACT IN AERS: 1.0000 | INHALATION_SPRAY | Freq: Four times a day (QID) | RESPIRATORY_TRACT | Status: DC | PRN

## 2016-05-25 MED ORDER — ROCURONIUM BROMIDE 10 MG/ML (PF) SYRINGE
PREFILLED_SYRINGE | INTRAVENOUS | Status: AC
  Filled 2016-05-25: qty 10

## 2016-05-25 MED ORDER — ACCU-CHEK AVIVA PLUS W/DEVICE KIT: 1.0000 | PACK | Freq: Two times a day (BID) | Status: DC

## 2016-05-25 MED ORDER — BUPIVACAINE-EPINEPHRINE (PF) 0.5% -1:200000 IJ SOLN
INTRAMUSCULAR | Status: DC | PRN
  Administered 2016-05-25: 30 mL via PERINEURAL

## 2016-05-25 MED ORDER — 0.9 % SODIUM CHLORIDE (POUR BTL) OPTIME
TOPICAL | Status: DC | PRN
  Administered 2016-05-25: 1000 mL

## 2016-05-25 MED ORDER — CHLORHEXIDINE GLUCONATE CLOTH 2 % EX PADS: 6.0000 | MEDICATED_PAD | Freq: Once | CUTANEOUS | Status: DC

## 2016-05-25 MED ORDER — HYDROCHLOROTHIAZIDE 12.5 MG PO CAPS
12.5000 mg | ORAL_CAPSULE | Freq: Every day | ORAL | Status: DC
  Administered 2016-05-25 – 2016-05-27 (×3): 12.5 mg via ORAL
  Filled 2016-05-25 (×3): qty 1

## 2016-05-25 MED ORDER — HYDRALAZINE HCL 20 MG/ML IJ SOLN
INTRAMUSCULAR | Status: AC
  Filled 2016-05-25: qty 1

## 2016-05-25 MED ORDER — CEFAZOLIN SODIUM-DEXTROSE 2-4 GM/100ML-% IV SOLN
2.0000 g | Freq: Three times a day (TID) | INTRAVENOUS | Status: AC
  Administered 2016-05-25: 2 g via INTRAVENOUS
  Filled 2016-05-25: qty 100

## 2016-05-25 MED ORDER — GLIPIZIDE 5 MG PO TABS
10.0000 mg | ORAL_TABLET | Freq: Every day | ORAL | Status: DC | PRN
  Filled 2016-05-25: qty 1

## 2016-05-25 MED ORDER — LABETALOL HCL 5 MG/ML IV SOLN
INTRAVENOUS | Status: DC | PRN
  Administered 2016-05-25 (×2): 2.5 mg via INTRAVENOUS

## 2016-05-25 MED ORDER — LACTATED RINGERS IV SOLN
INTRAVENOUS | Status: DC | PRN
  Administered 2016-05-25: 07:00:00 via INTRAVENOUS

## 2016-05-25 SURGICAL SUPPLY — 105 items
ADH SKN CLS APL DERMABOND .7 (GAUZE/BANDAGES/DRESSINGS) ×6
APL SKNCLS STERI-STRIP NONHPOA (GAUZE/BANDAGES/DRESSINGS)
APPLIER CLIP 9.375 MED OPEN (MISCELLANEOUS) ×5
APR CLP MED 9.3 20 MLT OPN (MISCELLANEOUS) ×3
BANDAGE ACE 4X5 VEL STRL LF (GAUZE/BANDAGES/DRESSINGS) IMPLANT
BANDAGE ACE 6X5 VEL STRL LF (GAUZE/BANDAGES/DRESSINGS) IMPLANT
BENZOIN TINCTURE PRP APPL 2/3 (GAUZE/BANDAGES/DRESSINGS) ×2 IMPLANT
BINDER BREAST LRG (GAUZE/BANDAGES/DRESSINGS) IMPLANT
BINDER BREAST XLRG (GAUZE/BANDAGES/DRESSINGS) ×3 IMPLANT
BLADE DERMATOME II (BLADE) ×2 IMPLANT
BLADE SURG ROTATE 9660 (MISCELLANEOUS) IMPLANT
BNDG COHESIVE 4X5 TAN STRL (GAUZE/BANDAGES/DRESSINGS) IMPLANT
BNDG COHESIVE 6X5 TAN STRL LF (GAUZE/BANDAGES/DRESSINGS) ×2 IMPLANT
BNDG GAUZE ELAST 4 BULKY (GAUZE/BANDAGES/DRESSINGS) IMPLANT
CANISTER SUCTION 2500CC (MISCELLANEOUS) ×5 IMPLANT
CANISTER WOUND CARE 500ML ATS (WOUND CARE) IMPLANT
CHLORAPREP W/TINT 26ML (MISCELLANEOUS) ×5 IMPLANT
CLIP APPLIE 9.375 MED OPEN (MISCELLANEOUS) ×3 IMPLANT
CLOSURE WOUND 1/2 X4 (GAUZE/BANDAGES/DRESSINGS)
CONT SPEC 4OZ CLIKSEAL STRL BL (MISCELLANEOUS) IMPLANT
CONT SPECI 4OZ STER CLIK (MISCELLANEOUS) IMPLANT
COVER SURGICAL LIGHT HANDLE (MISCELLANEOUS) ×7 IMPLANT
DERMABOND ADVANCED (GAUZE/BANDAGES/DRESSINGS) ×4
DERMABOND ADVANCED .7 DNX12 (GAUZE/BANDAGES/DRESSINGS) ×2 IMPLANT
DERMACARRIERS GRAFT 1 TO 1.5 (DISPOSABLE)
DEVICE DISSECT PLASMABLAD 3.0S (MISCELLANEOUS) IMPLANT
DRAIN CHANNEL 15F RND FF W/TCR (WOUND CARE) ×3 IMPLANT
DRAIN CHANNEL 19F RND (DRAIN) ×8 IMPLANT
DRAIN WOUND SNY 15 RND (WOUND CARE) IMPLANT
DRAPE HALF SHEET 40X57 (DRAPES) ×8 IMPLANT
DRAPE INCISE IOBAN 66X45 STRL (DRAPES) IMPLANT
DRAPE LAPAROSCOPIC ABDOMINAL (DRAPES) ×2 IMPLANT
DRAPE ORTHO SPLIT 77X108 STRL (DRAPES) ×10
DRAPE PROXIMA HALF (DRAPES) ×8 IMPLANT
DRAPE SURG ORHT 6 SPLT 77X108 (DRAPES) ×2 IMPLANT
DRESSING TELFA 8X10 (GAUZE/BANDAGES/DRESSINGS) ×2 IMPLANT
DRSG ADAPTIC 3X8 NADH LF (GAUZE/BANDAGES/DRESSINGS) ×6 IMPLANT
DRSG PAD ABDOMINAL 8X10 ST (GAUZE/BANDAGES/DRESSINGS) ×8 IMPLANT
DRSG TELFA 3X8 NADH (GAUZE/BANDAGES/DRESSINGS) ×5 IMPLANT
DRSG VAC ATS MED SENSATRAC (GAUZE/BANDAGES/DRESSINGS) IMPLANT
DRSG VAC ATS SM SENSATRAC (GAUZE/BANDAGES/DRESSINGS) IMPLANT
ELECT BLADE 4.0 EZ CLEAN MEGAD (MISCELLANEOUS) ×5
ELECT CAUTERY BLADE 6.4 (BLADE) ×5 IMPLANT
ELECT COATED BLADE 2.86 ST (ELECTRODE) ×5 IMPLANT
ELECT REM PT RETURN 9FT ADLT (ELECTROSURGICAL) ×5
ELECTRODE BLDE 4.0 EZ CLN MEGD (MISCELLANEOUS) ×3 IMPLANT
ELECTRODE REM PT RTRN 9FT ADLT (ELECTROSURGICAL) ×3 IMPLANT
EVACUATOR SILICONE 100CC (DRAIN) ×10 IMPLANT
GAUZE SPONGE 4X4 12PLY STRL (GAUZE/BANDAGES/DRESSINGS) ×6 IMPLANT
GEL ULTRASOUND 20GR AQUASONIC (MISCELLANEOUS) ×5 IMPLANT
GLOVE BIO SURGEON STRL SZ 6 (GLOVE) ×5 IMPLANT
GLOVE BIO SURGEON STRL SZ 6.5 (GLOVE) ×2 IMPLANT
GLOVE BIO SURGEONS STRL SZ 6.5 (GLOVE) ×1
GLOVE BIOGEL PI IND STRL 7.0 (GLOVE) ×1 IMPLANT
GLOVE BIOGEL PI IND STRL 7.5 (GLOVE) ×1 IMPLANT
GLOVE BIOGEL PI INDICATOR 7.0 (GLOVE) ×2
GLOVE BIOGEL PI INDICATOR 7.5 (GLOVE) ×2
GLOVE EUDERMIC 7 POWDERFREE (GLOVE) ×8 IMPLANT
GLOVE SURG SS PI 6.5 STRL IVOR (GLOVE) ×3 IMPLANT
GOWN STRL REUS W/ TWL LRG LVL3 (GOWN DISPOSABLE) ×10 IMPLANT
GOWN STRL REUS W/ TWL XL LVL3 (GOWN DISPOSABLE) ×3 IMPLANT
GOWN STRL REUS W/TWL LRG LVL3 (GOWN DISPOSABLE) ×10
GOWN STRL REUS W/TWL XL LVL3 (GOWN DISPOSABLE) ×5
GRAFT DERMACARRIERS 1 TO 1.5 (DISPOSABLE) ×2 IMPLANT
ILLUMINATOR WAVEGUIDE N/F (MISCELLANEOUS) IMPLANT
KIT BASIN OR (CUSTOM PROCEDURE TRAY) ×10 IMPLANT
KIT ROOM TURNOVER OR (KITS) ×7 IMPLANT
LIGHT WAVEGUIDE WIDE FLAT (MISCELLANEOUS) IMPLANT
NS IRRIG 1000ML POUR BTL (IV SOLUTION) ×7 IMPLANT
PACK GENERAL/GYN (CUSTOM PROCEDURE TRAY) ×7 IMPLANT
PAD ABD 8X10 STRL (GAUZE/BANDAGES/DRESSINGS) ×3 IMPLANT
PAD ARMBOARD 7.5X6 YLW CONV (MISCELLANEOUS) ×9 IMPLANT
PAD DRESSING TELFA 3X8 NADH (GAUZE/BANDAGES/DRESSINGS) IMPLANT
PADDING CAST COTTON 6X4 STRL (CAST SUPPLIES) IMPLANT
PIN SAFETY STERILE (MISCELLANEOUS) ×2 IMPLANT
PLASMABLADE 3.0S (MISCELLANEOUS)
SPECIMEN JAR X LARGE (MISCELLANEOUS) ×2 IMPLANT
SPONGE LAP 18X18 X RAY DECT (DISPOSABLE) ×18 IMPLANT
STAPLER VISISTAT 35W (STAPLE) ×5 IMPLANT
STOCKINETTE IMPERVIOUS LG (DRAPES) ×2 IMPLANT
STRIP CLOSURE SKIN 1/2X4 (GAUZE/BANDAGES/DRESSINGS) ×2 IMPLANT
SUT CHROMIC 4 0 PS 2 18 (SUTURE) IMPLANT
SUT ETHILON 2 0 FS 18 (SUTURE) ×6 IMPLANT
SUT ETHILON 3 0 FSL (SUTURE) ×7 IMPLANT
SUT MNCRL AB 4-0 PS2 18 (SUTURE) ×8 IMPLANT
SUT PDS AB 2-0 CT1 27 (SUTURE) ×9 IMPLANT
SUT SILK 2 0 FS (SUTURE) ×2 IMPLANT
SUT SILK 2 0 SH (SUTURE) ×6 IMPLANT
SUT VIC AB 3-0 SH 18 (SUTURE) ×2 IMPLANT
SUT VIC AB 3-0 SH 27 (SUTURE) ×5
SUT VIC AB 3-0 SH 27X BRD (SUTURE) ×1 IMPLANT
SUT VIC AB 4-0 PS2 27 (SUTURE) IMPLANT
SUT VIC AB 5-0 P-3 18XBRD (SUTURE) IMPLANT
SUT VIC AB 5-0 P3 18 (SUTURE)
SUT VICRYL 4-0 PS2 18IN ABS (SUTURE) ×6 IMPLANT
SUT VICRYL AB 2 0 TIES (SUTURE) ×6 IMPLANT
SWAB CULTURE LIQUID MINI MALE (MISCELLANEOUS) IMPLANT
SYR CONTROL 10ML LL (SYRINGE) IMPLANT
TAPE CLOTH SURG 4X10 WHT LF (GAUZE/BANDAGES/DRESSINGS) ×3 IMPLANT
TOWEL OR 17X24 6PK STRL BLUE (TOWEL DISPOSABLE) ×7 IMPLANT
TOWEL OR 17X26 10 PK STRL BLUE (TOWEL DISPOSABLE) ×7 IMPLANT
TUBE ANAEROBIC SPECIMEN COL (MISCELLANEOUS) IMPLANT
TUBE CONNECTING 12'X1/4 (SUCTIONS)
TUBE CONNECTING 12X1/4 (SUCTIONS) IMPLANT
UNDERPAD 30X30 (UNDERPADS AND DIAPERS) ×2 IMPLANT

## 2016-05-25 NOTE — Anesthesia Preprocedure Evaluation (Addendum)
Anesthesia Evaluation  Patient identified by MRN, date of birth, ID band Patient awake    Reviewed: Allergy & Precautions, NPO status , Patient's Chart, lab work & pertinent test results  History of Anesthesia Complications (+) DIFFICULT AIRWAY  Airway Mallampati: II  TM Distance: >3 FB Neck ROM: Full    Dental  (+) Dental Advisory Given, Edentulous Upper, Edentulous Lower   Pulmonary asthma , former smoker,    breath sounds clear to auscultation       Cardiovascular hypertension, Pt. on medications + CAD and + Cardiac Stents   Rhythm:Regular     Neuro/Psych Left weakness, memory loss CVA, Residual Symptoms    GI/Hepatic negative GI ROS, Neg liver ROS,   Endo/Other  diabetes, Type 2, Oral Hypoglycemic Agents  Renal/GU negative Renal ROS     Musculoskeletal  (+) Arthritis ,   Abdominal   Peds  Hematology negative hematology ROS (+)   Anesthesia Other Findings   Reproductive/Obstetrics                           Anesthesia Physical Anesthesia Plan  ASA: III  Anesthesia Plan: General   Post-op Pain Management:  Regional for Post-op pain   Induction: Intravenous  Airway Management Planned: Oral ETT  Additional Equipment: None  Intra-op Plan:   Post-operative Plan: Extubation in OR  Informed Consent: I have reviewed the patients History and Physical, chart, labs and discussed the procedure including the risks, benefits and alternatives for the proposed anesthesia with the patient or authorized representative who has indicated his/her understanding and acceptance.   Dental advisory given  Plan Discussed with: CRNA, Anesthesiologist and Surgeon  Anesthesia Plan Comments:        Anesthesia Quick Evaluation

## 2016-05-25 NOTE — Anesthesia Procedure Notes (Signed)
Procedure Name: Intubation Date/Time: 05/25/2016 7:39 AM Performed by: Garrison Columbus T Pre-anesthesia Checklist: Patient identified, Emergency Drugs available, Suction available and Patient being monitored Patient Re-evaluated:Patient Re-evaluated prior to inductionOxygen Delivery Method: Circle System Utilized Preoxygenation: Pre-oxygenation with 100% oxygen Intubation Type: IV induction Ventilation: Mask ventilation without difficulty and Oral airway inserted - appropriate to patient size Laryngoscope Size: Sabra Heck and 2 Grade View: Grade I Tube type: Oral Tube size: 7.0 mm Number of attempts: 1 Airway Equipment and Method: Stylet and Oral airway Placement Confirmation: ETT inserted through vocal cords under direct vision,  positive ETCO2 and breath sounds checked- equal and bilateral Secured at: 22 cm Tube secured with: Tape Dental Injury: Teeth and Oropharynx as per pre-operative assessment

## 2016-05-25 NOTE — Consult Note (Signed)
Cardiology Consult    Patient ID: TASHAUNA CAISSE MRN: [id number], DOB/AGE: 1932-04-30   Admit date: 05/25/2016 Date of Consult: 05/25/2016  Primary Physician: Philis Fendt, MD Primary Cardiologist: Dr. Percival Spanish Requesting Provider: Dr. Dalbert Batman Reason for Consultation: Abnormal EKG  Patient Profile    82 yo female with PMH of remote CAD (DES to OM3, 90% RCA stenosis), HTN, HLD, breast CA, DM who presented for right breast mastectomy.   Past Medical History   Past Medical History:  Diagnosis Date  . Arthritis   . Asthma    rarely uses neb  . Breast cancer of lower-inner quadrant of right female breast ([place]) 01/22/2016  . Carpal tunnel syndrome   . Coronary artery disease    DES to OM3 90% stenosis 2003, 90% small RCA stenosis.    . Diabetes mellitus   . History of stomach ulcers   . Hyperlipemia   . Hypertension   . Irregular heart beat     Past Surgical History:  Procedure Laterality Date  . ABDOMINAL HYSTERECTOMY    . CARDIAC CATHETERIZATION  2003   stent-  . CARPAL TUNNEL RELEASE  03/29/2012   Procedure: CARPAL TUNNEL RELEASE;  Surgeon: Wynonia Sours, MD;  Location: [facility];  Service: Orthopedics;  Laterality: Left;  . EYE SURGERY     catracts  . SHOULDER ARTHROSCOPY     right and left  . TRIGGER FINGER RELEASE  03/29/2012   Procedure: RELEASE TRIGGER FINGER/A-1 PULLEY;  Surgeon: Wynonia Sours, MD;  Location: [facility];  Service: Orthopedics;  Laterality: Left;     Allergies  Allergies  Allergen Reactions  . Lyrica [Pregabalin] Swelling    SWELLING REACTION UNSPECIFIED   . Feldene [Piroxicam] Rash  . Lisinopril Other (See Comments)    States that it makes her "sensitive to light"  . Orudis [Ketoprofen] Rash  . Vibramycin [Doxycycline Calcium] Rash and Hives    History of Present Illness    Mrs. Hargrove is a 82 yo female with PMH of remote CAD s/p cypher stent to OM3, with 80% RCA in 2003, HTN, HLD, DM and breast CA. She was  seen in the office on 8/17 by Dr. Percival Spanish for preop clearance, and had a low risk nuclear study with fixed defect. She currently lives at home with her daughter, but states that she is generally independent with her ADLs. States she has been in her usual state of health, and denies any anginal symptoms or dyspnea with daily activity.   She presented today for a right modified radical mastectomy for a large fungating breast CA that failed to respond to chemotherapy. While in surgery it was reported that she had an episode of SVT and noted ST depression on her monitoring strips. Pt currently denies any active chest pain, or dyspnea. It is noted to be hypertensive post procedure. No current EKG on file for this admission.   Inpatient Medications    . sodium chloride   Intravenous Once  . Chlorhexidine Gluconate Cloth  6 each Topical Once   And  . Chlorhexidine Gluconate Cloth  6 each Topical Once    Family History    Family History  Problem Relation Age of Onset  . Heart disease Mother     No details     Social History    Social History   Social History  . Marital status: Single    Spouse name: N/A  . Number of children: 2  . Years  of education: N/A   Occupational History  . Not on file.   Social History Main Topics  . Smoking status: Former Smoker    Quit date: 03/24/1978  . Smokeless tobacco: Never Used  . Alcohol use No  . Drug use: No  . Sexual activity: Not on file   Other Topics Concern  . Not on file   Social History Narrative   Lives with daughter.  One grandson.      Review of Systems    General:  No chills, fever, night sweats or weight changes.  Cardiovascular:  No chest pain, dyspnea on exertion, edema, orthopnea, palpitations, paroxysmal nocturnal dyspnea. Dermatological: No rash, lesions/masses Respiratory: No cough, dyspnea Urologic: No hematuria, dysuria Abdominal:   No nausea, vomiting, diarrhea, bright red blood per rectum, melena, or  hematemesis Neurologic:  No visual changes, wkns, changes in mental status. All other systems reviewed and are otherwise negative except as noted above.  Physical Exam    Blood pressure (!) 163/79, pulse (!) 54, temperature 97.3 F (36.3 C), resp. rate 13, height 5' 2.5" (1.588 m), weight 160 lb (72.6 kg), SpO2 100 %.  General: Pleasant older AAF, NAD Psych: Normal affect. Neuro: Alert and oriented X 3. Moves all extremities spontaneously. HEENT: Normal  Neck: Supple without bruits or JVD. Lungs:  Resp regular and unlabored, CTA. Binder noted to chest.  Heart: RRR no s3, s4, or murmurs. Abdomen: Soft, non-tender, non-distended, BS + x 4.  Extremities: No clubbing, cyanosis or edema. DP/PT/Radials 2+ and equal bilaterally.  Labs    Troponin (Point of Care Test) No results for input(s): TROPIPOC in the last 72 hours. No results for input(s): CKTOTAL, CKMB, TROPONINI in the last 72 hours. Lab Results  Component Value Date   WBC 6.4 05/19/2016   HGB 11.0 (L) 05/19/2016   HCT 34.4 (L) 05/19/2016   MCV 87.3 05/19/2016   PLT 284 05/19/2016    Recent Labs Lab 05/19/16 1130  NA 140  K 3.8  CL 108  CO2 23  BUN 12  CREATININE 0.95  CALCIUM 9.5  PROT 7.3  BILITOT 0.4  ALKPHOS 50  ALT 13*  AST 32  GLUCOSE 167*   No results found for: CHOL, HDL, LDLCALC, TRIG No results found for: [facility]   Radiology Studies    No results found.  ECG & Cardiac Imaging    EKG: 05/25/16 SB with nonspecific T wave changes  Echo: None  Stress Test: 02/27/16  Study Highlights     The left ventricular ejection fraction is mildly decreased (45-54%).  Nuclear stress EF: 54%.  There was no ST segment deviation noted during stress.  Defect 1: There is a small defect of severe severity present in the apex location.  This is a low risk study.   Low risk stress nuclear study with prominent apical thinning vs small prior infarct; no ischemia; EF 54 with normal wall motion.     Assessment & Plan    82 yo female with PMH of remote CAD (DES to OM3, 90% RCA stenosis), HTN, HLD, breast CA, DM who presented for right breast mastectomy.  1. Abnormal EKG/Telemetry: Noted while she was undergoing right breast surgery today. F/u EKG shows SB with nonspecific T wave changes noted on previous EKG from 2015. She denies any anginal symptoms prior to this admission, currently comfortable at the time of assessment.  -- Check electrolytes for K+ and Mag. Will cycle trops. Monitor on telemetry for any further arrhthymias.   2.  CAD (DES to OM3, 90% RCA 2003): States she has been doing well since that time. Active in her home without any anginal symptoms. Currently lives with her daughter.   3. HTN: Blood pressure elevated post operatively, but has not had her home medications. Monitor.    Barnet Pall, NP-C Pager [phone] 05/25/2016, 11:21 AM  Patient seen, examined. Available data reviewed. Agree with findings, assessment, and plan as outlined by Reino Bellis, NP. The patient is independently interviewed and examined. All available data is reviewed. We are asked to see this patient who underwent mastectomy with chest wall repair involving plastic surgery. During the operation, she apparently developed supraventricular tachycardia and was noted to have ST segment depression. The strips are not available for review. However, there are some strips from the operating room demonstrating sinus rhythm with mild ST segment depression presumably occurring after her SVT broke.. At the time of my interview, the patient has no complaints. She denies chest pain, chest pressure, or shortness of breath. On exam, she is awake and easily arousable and nearly postoperative period. JVP is normal, no carotid bruits, lung fields are clear, heart is regular rate and rhythm without murmur or gallop, abdomen is soft and nontender, there is no pretibial edema. The patient had a low risk  nuclear stress test in August 2017 as part of her preoperative evaluation. This demonstrated a fixed defect without ischemia. LVEF was normal. She has not had any exertional chest pain or pressure leading up to surgery. It seems reasonable to monitor her on telemetry and cycle cardiac markers. She does not require any specific therapy at this time. Will follow-up on her clinical progress tomorrow. Otherwise plan as outlined above.   Sherren Mocha, M.D. 05/25/2016 1:27 PM

## 2016-05-25 NOTE — Anesthesia Procedure Notes (Signed)
Anesthesia Regional Block:  Pectoralis block  Pre-Anesthetic Checklist: ,, timeout performed, Correct Patient, Correct Site, Correct Laterality, Correct Procedure, Correct Position, site marked, Risks and benefits discussed,  Surgical consent,  Pre-op evaluation,  At surgeon's request and post-op pain management  Laterality: Right  Prep: chloraprep       Needles:  Injection technique: Single-shot  Needle Type: Echogenic Stimulator Needle          Additional Needles:  Procedures: ultrasound guided (picture in chart) Pectoralis block Narrative:  Injection made incrementally with aspirations every 5 mL.  Performed by: Personally  Anesthesiologist: Savina Olshefski  Additional Notes: H+P and labs reviewed, risks and benefits discussed with patient, procedure tolerated well without complications

## 2016-05-25 NOTE — Transfer of Care (Signed)
Immediate Anesthesia Transfer of Care Note  Patient: Jennifer Garrett  Procedure(s) Performed: Procedure(s): RIGHT MASTECTOMY MODIFIED RADICAL (Right) complex repair of 25cm wound (Right)  Patient Location: PACU  Anesthesia Type:General and Regional  Level of Consciousness: awake and alert   Airway & Oxygen Therapy: Patient Spontanous Breathing and Patient connected to nasal cannula oxygen  Post-op Assessment: Report given to RN, Post -op Vital signs reviewed and stable and Patient moving all extremities X 4  Post vital signs: Reviewed and stable  Last Vitals:  Vitals:   05/25/16 0603 05/25/16 1045  BP: (!) 189/56 (!) 163/79  Pulse: (!) 58 (!) 54  Resp: 20 13  Temp: 36.6 C 36.3 C    Last Pain:  Vitals:   05/25/16 0603  TempSrc: Oral         Complications: No apparent anesthesia complications

## 2016-05-25 NOTE — Progress Notes (Signed)
Patient's BP 180s/70s.  SB 45-50. SO2 100% on 2L nasal cannula.  Dr. Ermalene Postin made aware and will defer treatment to cardiology, currently at bedside.  Will continue to monitor.

## 2016-05-25 NOTE — Progress Notes (Signed)
CRITICAL VALUE ALERT  Critical value received:  Troponin 0.07  Date of notification:  05/25/16  Time of notification:  2105  Critical value read back:Yes.    Nurse who received alert:  Bryson Dames, RN  MD notified (1st page):  2110, Dr. Kieth Brightly   Time MD responded:  2110

## 2016-05-25 NOTE — Interval H&P Note (Signed)
History and Physical Interval Note:  05/25/2016 6:52 AM  Jennifer Garrett  has presented today for surgery, with the diagnosis of RIGHT BREAST CANCER  The various methods of treatment have been discussed with the patient and family. After consideration of risks, benefits and other options for treatment, the patient has consented to local fasciocutaneous flap from abdomen, possible skin graft from right or left thigh as a surgical intervention .  The patient's history has been reviewed, patient examined, no change in status, stable for surgery.  I have reviewed the patient's chart and labs.  Questions were answered to the patient's satisfaction.     Gerren Hoffmeier

## 2016-05-25 NOTE — Op Note (Signed)
Operative Note   DATE OF OPERATION: 11.7.17  LOCATION: [facility]-in[facility]  SURGICAL DIVISION: Plastic Surgery  PREOPERATIVE DIAGNOSES:  1. Right breast caner  POSTOPERATIVE DIAGNOSES:  same  PROCEDURE:  Complex repair chest 25 cm  SURGEON: Irene Limbo MD MBA  ASSISTANT: Reynaldo Minium RN  ANESTHESIA:  General.   EBL: 500 ml for entire case  COMPLICATIONS: None immediate.   INDICATIONS FOR PROCEDURE:  The patient, Jennifer Garrett, is a 82 y.o. female born on 1931-11-25, is here for right modified radical mastectomy for treatment large fungating breast cancer that has failed to respond to neoadjuvant chemotherapy.   FINDINGS: Following tumor resection, able to elevate soft tissue for primary closure with final Wise pattern scar, vertical limb centered over anterior axillary line. Patient demonstrated SVT and ST depression during procedure and Cardiology will consult post operatively.  DESCRIPTION OF PROCEDURE:  The patient's operative site was marked with the patient in the preoperative area. The patient was taken to the operating room. SCDs were placed and IV antibiotics were given. Foley catheter placed. The patient's operative site was prepped and draped in a sterile fashion. A time out was performed and all information was confirmed to be correct. Following completion mastectomy, hemostasis obtained and 19 Fr and 15 Fr JP drains placed in subcutaneous position and secured to skin with 2-0 nylon. Inferiorly, the skin flap was elevated in subfascial plane over abdominal wall and advanced cephalad. Patient tailor tacked closed with final skin scar converted to vertical and transverse limbs. Redundant skin excised. Closured completed in layers with interrupted 2-0 PDS, 3-0 vicryl, and 4-0 vicryl in superficial fascia and dermal layers. Skin closure completed with 4-0 monocryl subcuticular and tissue adhesive applied. Dry dressing applied.    Patient was extubated and transferred to  PACU.  SPECIMENS: none  DRAINS: 15 Fr JP right lateral chest and axilla, 19 Fr JP right medial chest  Irene Limbo, MD [facility] Plastic & Reconstructive Surgery [phone], pin [phone]

## 2016-05-25 NOTE — Interval H&P Note (Signed)
History and Physical Interval Note:  05/25/2016 6:39 AM  Jennifer Garrett  has presented today for surgery, with the diagnosis of RIGHT BREAST CANCER  The various methods of treatment have been discussed with the patient and family. After consideration of risks, benefits and other options for treatment, the patient has consented to  Procedure(s): RIGHT MASTECTOMY MODIFIED RADICAL (Right) SKIN GRAFT SPLIT THICKNESS FROM RIGHT LEG TO CHEST, POSSIBLE JASON TISSUE TO TRANSFER POSSIBLE FASCIOCUTANEOUS FLAP FROM ABDOMEN TO CHEST (Right) as a surgical intervention .  The patient's history has been reviewed, patient examined, no change in status, stable for surgery.  I have reviewed the patient's chart and labs.  Questions were answered to the patient's satisfaction.     Adin Hector

## 2016-05-25 NOTE — Op Note (Signed)
Patient Name:           Jennifer Garrett   Date of Surgery:        05/25/2016  Pre op Diagnosis:      Primary malignant neoplasm of right breast with metastasis to movable ipsilateral level one and level II axillary lymph nodes                                      Status post neoadjuvant chemotherapy  Post op Diagnosis:    Same  Procedure:                 Right modified radical mastectomy  Surgeon:                     Edsel Petrin. Dalbert Batman, M.D., FACS  Assistant:                      Celedonio Miyamoto, M.D.   Indication for Assistant: Complex breast cancer, stage TIVb, N2,  with multiple skin ulcers requiring complex incision, hemorrhage control due to neovascularity and complex right axillary disease.  Assistant necessary for exposure and to reduce intraoperative and postop complications  Operative Indications:  . This is a 82 year old African female who returns to the office with her daughter to make final plans for scheduling her right mastectomy. Doctor Avbuere is her PCP. Dr. Jana Hakim is her oncologist. Dr. Percival Spanish is her cardiologist. Dr. Audelia Acton is her neurologist.      She initially presented in January 12, 2016 with a neglected breast cancer with an ulcerated mass at 6 o'clock position and some satellite nodules as well the mass was biopsied and showed invasive adenocarcinoma, triple negative breast cancer. Right axillary masses biopsied and showed invasive carcinoma and a lymph node, also triple negative. Stage TIV, N1, stage IIIB. She received neoadjuvant chemotherapy since that time and has progressed. She has developed some satellite nodules in the central and lower breast as well as the skin below the inframammary crease. These are draining and bleeding. Dr. Jana Hakim referred her back to proceed with mastectomy, thinking that was preferable to intensifying her chemotherapy.      Significant comorbidities include hypertension, non-insulin-dependent diabetes, asthma,  hyperlipidemia. Cerebrovascular accident earlier this year treated as outpatient. She has memory and gait problems with does walk with a cane. Mostly wheelchair. She has fallen. Past history coronary artery disease. Recently evaluated by Dr. Percival Spanish with stress testing thought to be low risk for surgical procedure. Remote abdominal hysterectomy but does not know whether her ovaries were removed. Cardiac catheterization and angioplasty with stent in the past by Dr. Einar Gip..      Nuclear medicine cardiac study is low risk with a fixed defect. Stated to be acceptable risk for planned surgical procedure. CT scan head with contrast on February 13, 2016 shows mild atrophy with no focal or acute problems. CT chest dated April 12, 2016 shows right breast mass, increased in size. Right axillary metastasis are stable. No new sites of nodal metastatic disease in the chest. 4 scattered pulmonary nodules which are felt to be stable. No new significant pulmonary nodules.       She has been seeing Dr. Iran Planas who feels that she will at least tolerate skin grafting, possible abdominal rotation flap, less likely latissimus flap. I think the tumor on her chest wall is resectable because it is mobile but  I do not think we can achieve primary closure. I have discussed operative plan with Dr.Thimmappa and we are ready to proceed.  The patient and her daughter are willing to undergo the surgery. She'll be scheduled for right modified radical mastectomy with immediate reconstruction and skin coverage. I discussed the indications, details, techniques, and numerous risk of the surgery. They're aware the risk of bleeding, infection, arm numbness, arm swelling, shoulder disability, worsening of her mental status, stroke, myocardial infarction, pulmonary and thromboembolic problems. The daughter understands that the surgery is more likely palliative and likely not curative due to its metastatic spread to lymph  nodes and advanced local stage. She knows that we may get positive skin margins. She knows that she is at increased risk for wound breakdown and CV complications. She knows we are not sure she would tolerate radiation therapy. They understand these issues well. All of his questions are answered. They agree with this plan.  Operative Findings:       We were able to make a very generous, somewhat transverse elliptical incision across the upper breast and then inferiorly extending well below the inframammary crease and out toward the axilla.  There was excessive neovascularity and we had a great deal of bleeding, losing probably 2 units of blood.  I felt like all gross disease was removed, but there were multiple nodules in the skin and in the breast.  There was bulky adenopathy in the right axilla but it was not fixed to the chest wall or the axillary vein.  Procedure in Detail:          A right pectoral block was performed by the anesthesia department prior to the surgery.  The patient underwent general endotracheal anesthesia and a Foley catheter was placed.      Prior to the initiation the procedure the patient became hypertensive which was controlled.  She had ST segment depression which resolved.  Dr. Ermalene Postin   carried out a discussion with cardiology, Dr. Serita Sheller, who felt that we should go ahead but try to not do a lengthy procedure and they would see her postop.  They felt this was stress related and not a cardiac event otherwise.  The ST segment depression resolved and the patient remained stable throughout the procedure.      The chest, abdomen, and right thigh were prepped and draped in a sterile sterile fashion in case we needed to do abdominal flap or skin graft.  Surgical timeout was performed.  Intravenous antibiotic given.  We planned our incision to go widely around all visible and palpable tumor.  I made a generous transverse elliptical incision which extended well down onto the rib cage  well below the tumor at the inframammary crease.  Skin flaps were raised superiorly to the infraclavicular area, medially to the parasternal area, inferiorly down onto the anterior rectus sheath and laterally to the anterior border of the latissimus dorsi muscle.  The breast was then dissected off of the pectoralis major and pectoralis minor muscles with cautery.  Cautery and metal clips were used to control bleeders.  We divided the clavipectoral fascia and retracted the pectoralis major and minor muscles medially.  I can palpate the bulky adenopathy.  I carried the dissection all the way  to just below the axillary vein.  Some venous tributaries were clamped divided and ligated with 2-0 Vicryl ties.  Smaller vascular bleeders were controlled either with electrocautery or metal clips.  The specimen was marked with sutures to orient  the pathologist and the specimen was passed off the field.  The wound was copiously irrigated with saline.  Hemostasis appeared to be excellent.  Dr. Iran Planas felt that we could do a primary closure with a little bit of skin mobilization and so she was going to place drains and closed the incision without be dictated separately.      Other than the transient hypertension and ST segment depression, the patient remained stable and seemed to tolerate the procedure well.  EBL 500 mL.  Counts correct.  Complications none otherwise none.  Cardiology consult has been called.     Edsel Petrin. Dalbert Batman, M.D., FACS General and Minimally Invasive Surgery Breast and Colorectal Surgery  05/25/2016 9:25 AM

## 2016-05-26 ENCOUNTER — Encounter (HOSPITAL_COMMUNITY): Payer: Self-pay | Admitting: General Surgery

## 2016-05-26 DIAGNOSIS — R748 Abnormal levels of other serum enzymes: Secondary | ICD-10-CM

## 2016-05-26 LAB — BASIC METABOLIC PANEL
Anion gap: 7 (ref 5–15)
BUN: 12 mg/dL (ref 6–20)
CHLORIDE: 103 mmol/L (ref 101–111)
CO2: 26 mmol/L (ref 22–32)
Calcium: 8.5 mg/dL — ABNORMAL LOW (ref 8.9–10.3)
Creatinine, Ser: 0.94 mg/dL (ref 0.44–1.00)
GFR calc Af Amer: >60 mL/min (ref >60)
GFR calc non Af Amer: 55 mL/min — ABNORMAL LOW (ref >60)
GLUCOSE: 176 mg/dL — AB (ref 65–99)
POTASSIUM: 4.4 mmol/L (ref 3.5–5.1)
Sodium: 136 mmol/L (ref 135–145)

## 2016-05-26 LAB — TROPONIN I
Troponin I: <0.03 ng/mL (ref <0.03)
Troponin I: <0.03 ng/mL (ref <0.03)

## 2016-05-26 LAB — GLUCOSE, CAPILLARY
GLUCOSE-CAPILLARY: 249 mg/dL — AB (ref 65–99)
GLUCOSE-CAPILLARY: 116 mg/dL — AB (ref 65–99)
Glucose-Capillary: 186 mg/dL — ABNORMAL HIGH (ref 65–99)
Glucose-Capillary: 165 mg/dL — ABNORMAL HIGH (ref 65–99)

## 2016-05-26 LAB — CBC
HEMATOCRIT: 23.5 % — AB (ref 36.0–46.0)
Hemoglobin: 7.7 g/dL — ABNORMAL LOW (ref 12.0–15.0)
MCH: 28.3 pg (ref 26.0–34.0)
MCHC: 32.8 g/dL (ref 30.0–36.0)
MCV: 86.4 fL (ref 78.0–100.0)
Platelets: 210 10*3/uL (ref 150–400)
RBC: 2.72 MIL/uL — ABNORMAL LOW (ref 3.87–5.11)
RDW: 13.3 % (ref 11.5–15.5)
WBC: 11.9 10*3/uL — ABNORMAL HIGH (ref 4.0–10.5)

## 2016-05-26 MED ORDER — SODIUM CHLORIDE 0.9 % IV SOLN
Freq: Once | INTRAVENOUS | Status: AC
  Administered 2016-05-26: 10 mL/h via INTRAVENOUS

## 2016-05-26 NOTE — Evaluation (Addendum)
Physical Therapy Evaluation Patient Details Name: Jennifer Garrett MRN: [id number] DOB: 03-22-1932 Today's Date: 05/26/2016   History of Present Illness  Pt is an 82 y/o female s/p R modified radical mastectomy. PMH including but not limited to HTN, CAD, DM and hx of CVA earlier this year per surgeon's H&P.  Clinical Impression  Pt presented supine in bed with HOB elevated, awake and willing to participate in therapy session. Prior to admission, pt stated that she was independent with use of RW to ambulate when ambulating outside of her home. She stated that she did not need an AD to ambulate within her home. Pt moving very well during evaluation, but was limited secondary to fatigue. Pt would continue to benefit from skilled physical therapy services at this time while admitted and after d/c to address her below listed limitations in order to improve her overall safety and independence with functional mobility.     Follow Up Recommendations Home health PT;Supervision for mobility/OOB    Equipment Recommendations  None recommended by PT    Recommendations for Other Services       Precautions / Restrictions Precautions Precautions: Fall Restrictions Weight Bearing Restrictions: No      Mobility  Bed Mobility Overal bed mobility: Needs Assistance Bed Mobility: Supine to Sit     Supine to sit: Min guard;HOB elevated     General bed mobility comments: pt required increased time and min guard for safety  Transfers Overall transfer level: Needs assistance Equipment used: Rolling walker (2 wheeled) Transfers: Sit to/from Stand Sit to Stand: Min assist         General transfer comment: pt required increased time, VC'ing for bilateral hand placement and min A to achieve full standing from bed  Ambulation/Gait Ambulation/Gait assistance: Min guard Ambulation Distance (Feet): 40 Feet Assistive device: Rolling walker (2 wheeled) Gait Pattern/deviations: Step-through  pattern;Decreased step length - right;Decreased step length - left;Decreased stride length;Trunk flexed Gait velocity: decreased   General Gait Details: pt required occasional VC'ing for safety with RW (i.e. - maintaining a safe distance and keeping RW on the floor)  Stairs            Wheelchair Mobility    Modified Rankin (Stroke Patients Only)       Balance Overall balance assessment: Needs assistance Sitting-balance support: Feet supported;No upper extremity supported Sitting balance-Leahy Scale: Fair     Standing balance support: During functional activity;Bilateral upper extremity supported Standing balance-Leahy Scale: Poor Standing balance comment: pt reliant on bilateral UEs on RW                             Pertinent Vitals/Pain Pain Assessment: No/denies pain  VSS throughout evaluation.    Home Living Family/patient expects to be discharged to:: Private residence Living Arrangements: Children Available Help at Discharge: Family;Available PRN/intermittently Type of Home: House Home Access: Stairs to enter   Entrance Stairs-Number of Steps: 1 Home Layout: One level Home Equipment: Walker - 2 wheels;Cane - single point;Bedside commode;Shower seat      Prior Function Level of Independence: Independent with assistive device(s)         Comments: pt reported that she ambulated within her home without an AD and ambulated within her community using a RW.     Hand Dominance        Extremity/Trunk Assessment   Upper Extremity Assessment: Overall WFL for tasks assessed  Lower Extremity Assessment: Generalized weakness         Communication   Communication: No difficulties  Cognition Arousal/Alertness: Awake/alert Behavior During Therapy: WFL for tasks assessed/performed Overall Cognitive Status: No family/caregiver present to determine baseline cognitive functioning       Memory: Decreased short-term memory               General Comments      Exercises     Assessment/Plan    PT Assessment Patient needs continued PT services  PT Problem List Decreased strength;Decreased activity tolerance;Decreased balance;Decreased mobility;Decreased coordination;Decreased knowledge of use of DME;Decreased safety awareness          PT Treatment Interventions DME instruction;Gait training;Stair training;Functional mobility training;Therapeutic activities;Therapeutic exercise;Balance training;Neuromuscular re-education;Patient/family education    PT Goals (Current goals can be found in the Care Plan section)  Acute Rehab PT Goals Patient Stated Goal: return home PT Goal Formulation: With patient Time For Goal Achievement: 06/09/16 Potential to Achieve Goals: Good    Frequency Min 3X/week   Barriers to discharge        Co-evaluation               End of Session Equipment Utilized During Treatment: Gait belt Activity Tolerance: Patient limited by fatigue Patient left: in chair;with call bell/phone within reach;with chair alarm set;with family/visitor present;with SCD's reapplied Nurse Communication: Mobility status         Time: 4944-9675 PT Time Calculation (min) (ACUTE ONLY): 18 min   Charges:   PT Evaluation $PT Eval Moderate Complexity: 1 Procedure     PT G CodesClearnce Garrett Jennifer Garrett 05/26/2016, 11:26 AM Jennifer Garrett, PT, DPT [phone]

## 2016-05-26 NOTE — Progress Notes (Signed)
Patient Name: Jennifer Garrett Date of Encounter: 05/26/2016  Primary Cardiologist: Dr. [facility] Problem List     Principal Problem:   Breast cancer of lower-inner quadrant of right female breast [facility]) Active Problems:   Cancer of overlapping sites of right female breast [facility])     Subjective   Patient feels well today, no chest pain or shortness of breath. She worked well with PT today.  Inpatient Medications    Scheduled Meds: . aspirin EC  81 mg Oral q morning - 10a  . atorvastatin  40 mg Oral Daily  . calcium carbonate  1 tablet Oral BID WC  . donepezil  5 mg Oral QHS  . enoxaparin (LOVENOX) injection  40 mg Subcutaneous Q24H  . hydrochlorothiazide  12.5 mg Oral Daily  . insulin aspart  0-15 Units Subcutaneous TID WC  . irbesartan  37.5 mg Oral Daily  . pantoprazole  40 mg Oral Daily  . potassium chloride SA  20 mEq Oral Daily  . senna  1 tablet Oral BID   Continuous Infusions: . lactated ringers 100 mL/hr at 05/26/16 0700   PRN Meds: albuterol, glipiZIDE, HYDROcodone-acetaminophen, methocarbamol, morphine injection, ondansetron **OR** ondansetron (ZOFRAN) IV   Vital Signs    Vitals:   05/26/16 1012 05/26/16 1024 05/26/16 1100 05/26/16 1211  BP:  (!) 105/51 (!) 104/47 111/77  Pulse:   72 70  Resp:   (!) 21 19  Temp: 98.1 F (36.7 C)   98.2 F (36.8 C)  TempSrc: Oral   Oral  SpO2:   98% 100%  Weight:      Height:        Intake/Output Summary (Last 24 hours) at 05/26/16 1302 Last data filed at 05/26/16 1136  Gross per 24 hour  Intake          1961.67 ml  Output             1190 ml  Net           771.67 ml   Filed Weights   05/25/16 0603 05/25/16 0629 05/25/16 1715  Weight: 162 lb (73.5 kg) 160 lb (72.6 kg) 164 lb 14.5 oz (74.8 kg)    Physical Exam    GEN: Well nourished, well developed, resting in bed, in no acute distress.  HEENT: Grossly normal.  Neck: Supple, no JVD, carotid bruits, or masses. Cardiac: Systolic murmur heard best at  the RUS border, no rubs, or gallops. No clubbing, cyanosis, edema.   Respiratory:  Respirations regular and unlabored, clear to auscultation bilaterally. GI: Soft, nontender, nondistended. Skin: warm Psych: AAOx3.  Normal affect.  Labs    CBC  Recent Labs  05/25/16 1441 05/26/16 0218  WBC 14.6* 11.9*  HGB 9.2* 7.7*  HCT 28.1* 23.5*  MCV 85.7 86.4  PLT PLATELET CLUMPS NOTED ON SMEAR, COUNT APPEARS ADEQUATE 662   Basic Metabolic Panel  Recent Labs  05/25/16 1639 05/26/16 0218  NA  --  136  K  --  4.4  CL  --  103  CO2  --  26  GLUCOSE  --  176*  BUN  --  12  CREATININE 0.85 0.94  CALCIUM  --  8.5*   Liver Function Tests No results for input(s): AST, ALT, ALKPHOS, BILITOT, PROT, ALBUMIN in the last 72 hours. No results for input(s): LIPASE, AMYLASE in the last 72 hours. Cardiac Enzymes  Recent Labs  05/25/16 1639 05/25/16 1951 05/26/16 0218 05/26/16 1119  CKTOTAL 206  --   --   --  CKMB 2.2  --   --   --   TROPONINI  --  0.07* <0.03 <0.03   BNP Invalid input(s): POCBNP D-Dimer No results for input(s): DDIMER in the last 72 hours. Hemoglobin A1C No results for input(s): HGBA1C in the last 72 hours. Fasting Lipid Panel No results for input(s): CHOL, HDL, LDLCALC, TRIG, CHOLHDL, LDLDIRECT in the last 72 hours. Thyroid Function Tests No results for input(s): TSH, T4TOTAL, T3FREE, THYROIDAB in the last 72 hours.  Invalid input(s): FREET3  Telemetry    Sinus rhythm, no SVT - Personally Reviewed  ECG    Sinus bradycardia, LAD, poor R-wave progression anterior leads, LVH - Personally Reviewed  Radiology    No results found.  Cardiac Studies   Myocardial Perfusion Imaging 02/27/2016:   The left ventricular ejection fraction is mildly decreased (45-54%).  Nuclear stress EF: 54%.  There was no ST segment deviation noted during stress.  Defect 1: There is a small defect of severe severity present in the apex location.  This is a low risk  study.   Low risk stress nuclear study with prominent apical thinning vs small prior infarct; no ischemia; EF 54 with normal wall motion.  Patient Profile     82 yo female with PMH of remote CAD (DES to OM3, 90% RCA stenosis), HTN, HLD, breast CA, DM who presented for right breast mastectomy. While in surgery it was reported that she had an episode of SVT and noted ST depression on her monitoring strips.  Assessment & Plan    Abnormal EKG/Telemetry: SVT and ST depression noted while she was undergoing right breast surgery on 05/25/2016. F/u EKG shows SB with nonspecific T wave changes noted on previous EKG from 2015. Initial Troponin 0.07, no negative x 2. -No change in therapy at this time, continue to monitor  CAD (DES to OM3, 90% RCA 2003): Denies any anginal pain.  -Continue Aspirin 81 mg daily -Continue Atorvastatin 40 mg daily  HTN: Normotensive to slightly decreased this morning. -Continue HCTZ 12.5 mg daily -Continue Irbesartan 37.5 mg daily (on Olmesartan 40 mg daily at home)  Signed, Zada Finders, MD  05/26/2016, 1:02 PM   History and all data above reviewed.  Patient examined.  I agree with the findings as above. The patient feels well post op.  She had SVT with some mild troponin elevation with surgery.  However, no further pain.  No evidence of active ischemia.   The patient exam reveals COR:RRR  ,  Lungs: Clear  ,  Abd: Positive bowel sounds, no rebound no guarding , Ext No edema  .  All available labs, radiology testing, previous records reviewed. Agree with documented assessment and plan. Elevated troponin.  Secondary to SVT.  No further arrhythmias.  No further work up. We will follow as needed.   Jeneen Rinks [place]  1:49 PM  05/26/2016

## 2016-05-26 NOTE — Progress Notes (Signed)
  POD# 1 right MRM, complex closure chest wound  Temp:  [97.3 F (36.3 C)-99.4 F (37.4 C)] 98.1 F (36.7 C) (11/08 0700) Pulse Rate:  [51-79] 70 (11/08 0700) Resp:  [11-24] 18 (11/08 0700) BP: (92-179)/(45-88) 111/51 (11/08 0700) SpO2:  [91 %-100 %] 96 % (11/08 0700) Weight:  [74.8 kg (164 lb 14.5 oz)] 74.8 kg (164 lb 14.5 oz) (11/07 1715)   JP 60/170 PO 240  PE Awake, alert, denies pain Not OOB yet Chest flat, incisions dry, no drainage on dressing JP serosanguinous  A/P Receiving 1 u PRBC currently No cardiac event likely Ambulate, pulm toilet Foley out today Has follow up with me arranged next week. Ok to shower from Plastics stand point starting tomorrow.  Irene Limbo, MD [facility] Plastic & Reconstructive Surgery [phone], pin [phone]

## 2016-05-26 NOTE — Progress Notes (Addendum)
1 Day Post-Op  Subjective: Stable and alert. Denies shortness of breath or anginal type chest pain. Minimal incisional pain Vital signs look fine.  Urine output or little low.  No problems with wound or drains.  I greatly appreciate involvement of cardiology. Hemoglobin down to 7.7.  BUN 12.  Creatinine 0.94.  Glucose 176.  On sliding scale insulin potassium 4.4. One troponin 0.07.  Repeat < 0.03. EKG yesterday showed nonspecific changes.  No ST segment elevation or depression.  Sinus rhythm.  Objective: Vital signs in last 24 hours: Temp:  [97.3 F (36.3 C)-99.4 F (37.4 C)] 99 F (37.2 C) (11/08 0328) Pulse Rate:  [51-79] 78 (11/08 0400) Resp:  [11-24] 24 (11/08 0400) BP: (92-189)/(45-88) 114/51 (11/08 0400) SpO2:  [91 %-100 %] 94 % (11/08 0400) Weight:  [72.6 kg (160 lb)-74.8 kg (164 lb 14.5 oz)] 74.8 kg (164 lb 14.5 oz) (11/07 1715) Last BM Date: 05/25/16 (PTA per patient )  Intake/Output from previous day: 11/07 0701 - 11/08 0700 In: 2701.7 [I.V.:2601.7; IV Piggyback:100] Out: 1515 [Urine:805; Drains:210; Blood:500] Intake/Output this shift: Total I/O In: 901.7 [I.V.:901.7] Out: 325 [Urine:290; Drains:35]  General appearance: Alert.  Cooperative.  Oriented.  No distress.  Mental status seems normal Resp: clear to auscultation bilaterally Chest wall: no tenderness, Right mastectomy wound looks good.  Skin flaps viable.  No hematoma.  Drainage serosanguineous.  Both drains functioning GI: soft, non-tender; bowel sounds normal; no masses,  no organomegaly Neurologic: Grossly normal  Lab Results:  Results for orders placed or performed during the hospital encounter of 05/25/16 (from the past 24 hour(s))  Glucose, capillary     Status: None   Collection Time: 05/25/16  6:09 AM  Result Value Ref Range   Glucose-Capillary 85 65 - 99 mg/dL   Comment 1 Notify RN    Comment 2 Document in Chart   Type and screen     Status: None (Preliminary result)   Collection Time:  05/25/16  8:55 AM  Result Value Ref Range   ABO/RH(D) A POS    Antibody Screen NEG    Sample Expiration 05/28/2016    Unit Number [id number]    Blood Component Type RED CELLS,LR    Unit division 00    Status of Unit ALLOCATED    Transfusion Status OK TO TRANSFUSE    Crossmatch Result Compatible    Unit Number [id number]    Blood Component Type RED CELLS,LR    Unit division 00    Status of Unit ALLOCATED    Transfusion Status OK TO TRANSFUSE    Crossmatch Result Compatible   Prepare RBC     Status: None   Collection Time: 05/25/16  8:55 AM  Result Value Ref Range   Order Confirmation ORDER PROCESSED BY BLOOD BANK   ABO/Rh     Status: None   Collection Time: 05/25/16  8:55 AM  Result Value Ref Range   ABO/RH(D) A POS   Glucose, capillary     Status: Abnormal   Collection Time: 05/25/16 10:45 AM  Result Value Ref Range   Glucose-Capillary 138 (H) 65 - 99 mg/dL  CBC     Status: Abnormal   Collection Time: 05/25/16  2:41 PM  Result Value Ref Range   WBC 14.6 (H) 4.0 - 10.5 K/uL   RBC 3.28 (L) 3.87 - 5.11 MIL/uL   Hemoglobin 9.2 (L) 12.0 - 15.0 g/dL   HCT 28.1 (L) 36.0 - 46.0 %   MCV 85.7 78.0 - 100.0 fL  MCH 28.0 26.0 - 34.0 pg   MCHC 32.7 30.0 - 36.0 g/dL   RDW 13.3 11.5 - 15.5 %   Platelets  150 - 400 K/uL    PLATELET CLUMPS NOTED ON SMEAR, COUNT APPEARS ADEQUATE  CK total and CKMB (cardiac)not at [facility]     Status: None   Collection Time: 05/25/16  4:39 PM  Result Value Ref Range   Total CK 206 38 - 234 U/L   CK, MB 2.2 0.5 - 5.0 ng/mL   Relative Index 1.1 0.0 - 2.5  Creatinine, serum     Status: None   Collection Time: 05/25/16  4:39 PM  Result Value Ref Range   Creatinine, Ser 0.85 0.44 - 1.00 mg/dL   GFR calc non Af Amer >60 >60 mL/min   GFR calc Af Amer >60 >60 mL/min  Glucose, capillary     Status: Abnormal   Collection Time: 05/25/16  5:12 PM  Result Value Ref Range   Glucose-Capillary 148 (H) 65 - 99 mg/dL   Comment 1 Capillary Specimen   MRSA PCR  Screening     Status: None   Collection Time: 05/25/16  5:21 PM  Result Value Ref Range   MRSA by PCR NEGATIVE NEGATIVE  Surgical PCR screen     Status: None   Collection Time: 05/25/16  6:31 PM  Result Value Ref Range   MRSA, PCR NEGATIVE NEGATIVE   Staphylococcus aureus NEGATIVE NEGATIVE  Troponin I     Status: Abnormal   Collection Time: 05/25/16  7:51 PM  Result Value Ref Range   Troponin I 0.07 (HH) <0.03 ng/mL  Glucose, capillary     Status: Abnormal   Collection Time: 05/25/16 10:07 PM  Result Value Ref Range   Glucose-Capillary 189 (H) 65 - 99 mg/dL   Comment 1 Capillary Specimen   Basic metabolic panel     Status: Abnormal   Collection Time: 05/26/16  2:18 AM  Result Value Ref Range   Sodium 136 135 - 145 mmol/L   Potassium 4.4 3.5 - 5.1 mmol/L   Chloride 103 101 - 111 mmol/L   CO2 26 22 - 32 mmol/L   Glucose, Bld 176 (H) 65 - 99 mg/dL   BUN 12 6 - 20 mg/dL   Creatinine, Ser 0.94 0.44 - 1.00 mg/dL   Calcium 8.5 (L) 8.9 - 10.3 mg/dL   GFR calc non Af Amer 55 (L) >60 mL/min   GFR calc Af Amer >60 >60 mL/min   Anion gap 7 5 - 15  CBC     Status: Abnormal   Collection Time: 05/26/16  2:18 AM  Result Value Ref Range   WBC 11.9 (H) 4.0 - 10.5 K/uL   RBC 2.72 (L) 3.87 - 5.11 MIL/uL   Hemoglobin 7.7 (L) 12.0 - 15.0 g/dL   HCT 23.5 (L) 36.0 - 46.0 %   MCV 86.4 78.0 - 100.0 fL   MCH 28.3 26.0 - 34.0 pg   MCHC 32.8 30.0 - 36.0 g/dL   RDW 13.3 11.5 - 15.5 %   Platelets 210 150 - 400 K/uL  Troponin I     Status: None   Collection Time: 05/26/16  2:18 AM  Result Value Ref Range   Troponin I <0.03 <0.03 ng/mL     Studies/Results: No results found.  . sodium chloride   Intravenous Once  . aspirin EC  81 mg Oral q morning - 10a  . atorvastatin  40 mg Oral Daily  . calcium carbonate  1 tablet Oral BID WC  . donepezil  5 mg Oral QHS  . enoxaparin (LOVENOX) injection  40 mg Subcutaneous Q24H  . hydrochlorothiazide  12.5 mg Oral Daily  . insulin aspart  0-15 Units  Subcutaneous TID WC  . irbesartan  37.5 mg Oral Daily  . pantoprazole  40 mg Oral Daily  . potassium chloride SA  20 mEq Oral Daily  . senna  1 tablet Oral BID     Assessment/Plan: s/p Procedure(s): RIGHT MASTECTOMY MODIFIED RADICAL complex repair of 25cm wound  POD #1.  Modified radical mastectomy right side.  Locally advanced cancer, multifocal, skin ulceration.  Complex wound closure. No surgical problems Mobilize out of bed. Discontinue Foley Diet as tolerated  Check pathology Leave IV rate 100 mL per hour due to oliguria  EKG changes during surgery.  No obvious evidence of cardiac event.  Theorize  stress related and transient hypertensive event upon anesthesia induction. Continue to monitor Transfer to 6 N  When okay with cardiology  Anemia.  Hgb. 11.1  preop to 7.7 today.  Acute blood loss.  EBL 500 cc.   Considering her known coronary artery disease we'll transfuse 1 unit PRBC  History CVA.  Reasonably functional at home PT and OT consults  Hyperglycemia.  Continue SSI. Glc. 186.  Reasonable control but would prefer 120-180.  Memory disorder.  Mild Hypertension.  Controlled Asthma.  Controlled    @PROBHOSP @  LOS: 1 day    Miliano Cotten M 05/26/2016  . .prob

## 2016-05-26 NOTE — Care Management Note (Signed)
Case Management Note  Patient Details  Name: ONEY TATLOCK MRN: [id number] Date of Birth: 03/26/1932  Subjective/Objective:                    Action/Plan:  Confirmed face sheet information with patient. Expected Discharge Date:                  Expected Discharge Plan:  [place]  In-House Referral:     Discharge planning Services  CM Consult  Post Acute Care Choice:  Home Health Choice offered to:  Patient  DME Arranged:    DME Agency:     HH Arranged:  RN, PT, OT HH Agency:  [place]  Status of Service:  Completed, signed off  If discussed at [place] of Stay Meetings, dates discussed:    Additional Comments:  Marilu Favre, RN 05/26/2016, 3:55 PM

## 2016-05-26 NOTE — Anesthesia Postprocedure Evaluation (Signed)
Anesthesia Post Note  Patient: KYOKO ELSEA  Procedure(s) Performed: Procedure(s) (LRB): RIGHT MASTECTOMY MODIFIED RADICAL (Right) complex repair of 25cm wound (Right)  Patient location during evaluation: PACU Anesthesia Type: General and Regional Level of consciousness: awake Pain management: pain level controlled Vital Signs Assessment: post-procedure vital signs reviewed and stable Respiratory status: spontaneous breathing Cardiovascular status: stable Postop Assessment: no signs of nausea or vomiting Anesthetic complications: no    Last Vitals:  Vitals:   05/26/16 1211 05/26/16 1540  BP: 111/77 (!) 106/46  Pulse: 70 67  Resp: 19 18  Temp: 36.8 C 37.3 C    Last Pain:  Vitals:   05/26/16 1540  TempSrc: Oral  PainSc:                  Kerly Rigsbee

## 2016-05-26 NOTE — Progress Notes (Addendum)
Received pt from [place] via bed.  Right chest dressing dry and intact, 2 JP drains in place. Pt denies pain at this time. Left hand with bruising and swelling, per report from [place], swelling was from infiltrated Iv. Elevated to pillows. [place] pt walking to the hall, confused, IV got pulled. Walked to the bathroom. Reoriented to the room.  Pt is confused and gets disoriented esp in the evening per daughter.

## 2016-05-27 LAB — BASIC METABOLIC PANEL
Anion gap: 6 (ref 5–15)
BUN: 13 mg/dL (ref 6–20)
CALCIUM: 8.7 mg/dL — AB (ref 8.9–10.3)
CO2: 28 mmol/L (ref 22–32)
CREATININE: 0.99 mg/dL (ref 0.44–1.00)
Chloride: 104 mmol/L (ref 101–111)
GFR calc Af Amer: 60 mL/min — ABNORMAL LOW (ref >60)
GFR, EST NON AFRICAN AMERICAN: 52 mL/min — AB (ref >60)
GLUCOSE: 138 mg/dL — AB (ref 65–99)
POTASSIUM: 4 mmol/L (ref 3.5–5.1)
SODIUM: 138 mmol/L (ref 135–145)

## 2016-05-27 LAB — CBC
HCT: 24.4 % — ABNORMAL LOW (ref 36.0–46.0)
Hemoglobin: 7.8 g/dL — ABNORMAL LOW (ref 12.0–15.0)
MCH: 27.7 pg (ref 26.0–34.0)
MCHC: 32 g/dL (ref 30.0–36.0)
MCV: 86.5 fL (ref 78.0–100.0)
PLATELETS: 212 10*3/uL (ref 150–400)
RBC: 2.82 MIL/uL — AB (ref 3.87–5.11)
RDW: 13.7 % (ref 11.5–15.5)
WBC: 11.4 10*3/uL — AB (ref 4.0–10.5)

## 2016-05-27 LAB — GLUCOSE, CAPILLARY
GLUCOSE-CAPILLARY: 142 mg/dL — AB (ref 65–99)
GLUCOSE-CAPILLARY: 100 mg/dL — AB (ref 65–99)

## 2016-05-27 MED ORDER — FERROUS SULFATE 325 (65 FE) MG PO TABS
325.0000 mg | ORAL_TABLET | Freq: Two times a day (BID) | ORAL | Status: DC
  Administered 2016-05-27: 325 mg via ORAL
  Filled 2016-05-27: qty 1

## 2016-05-27 MED ORDER — FERROUS SULFATE 325 (65 FE) MG PO TABS: 325.0000 mg | ORAL_TABLET | Freq: Two times a day (BID) | ORAL | 3 refills | Status: DC

## 2016-05-27 NOTE — Discharge Instructions (Signed)
°  Ok to remove dressings and shower 11.9.17. Soap and water ok, pat incisions dry. No creams or ointments over incisions. Do not let drains dangle in shower, attach to lanyard or similar.Strip and record drains twice daily and bring log to clinic visit.  Breast binder or soft compression bra all other times.  Ok to raise arms above shoulders for bathing and dressing.  No house yard work or exercise until cleared by MD.

## 2016-05-27 NOTE — Progress Notes (Signed)
  POD# 12right MRM, complex closure chest wound  Temp:  [98.1 F (36.7 C)-99.7 F (37.6 C)] 98.4 F (36.9 C) (11/09 0459) Pulse Rate:  [57-72] 66 (11/09 0459) Resp:  [14-21] 18 (11/09 0459) BP: (104-160)/(46-77) 160/50 (11/09 0459) SpO2:  [94 %-100 %] 94 % (11/09 0459)   JP 70/185   PE Sleeping, easily arousable Chest flat, incisions dry, no drainage on dressing JP serosanguinous  A/P Plan d/c home today. Has follow up with me arranged next week. Ok to shower from Plastics stand point starting tomorrow.  Irene Limbo, MD [facility] Plastic & Reconstructive Surgery [phone], pin [phone]

## 2016-05-27 NOTE — Discharge Summary (Addendum)
Patient ID: Jennifer Garrett [id number] 82 y.o. 22-Apr-1932  Admit date: 05/25/2016  Discharge date and time: 05/27/2016  Admitting Physician: Adin Hector  Discharge Physician: Adin Hector  Admission Diagnoses: RIGHT BREAST CANCER  Discharge Diagnoses: Primary malignant neoplasm right breast with metastasis to movable level I and level II axillary lymph nodes (stage T4b, N2a)                                         Memory disorder                                         History cerebrovascular accident                                         Type 2 diabetes treated without insulin                                          Perioperative EKG changes, resolved without cardiac event                                          Asthma, moderate                                          Coronary artery disease                                          Hypertension                                          Hyperlipidemia  Operations: Procedure(s): RIGHT MASTECTOMY MODIFIED RADICAL complex repair of 25cm wound  Admission Condition: fair  Discharged Condition: fair  Indication for Admission: . This is a 82 year old African female is admitted electively for right mastectomy and wound coverage, possible skin graft  Doctor [name] is her PCP. Dr. Jana Hakim is her oncologist. Dr. Percival Spanish is her cardiologist. Dr. Audelia Acton is her neurologist.      She initially presented in January 12, 2016 with a neglected breast cancer with an ulcerated mass at 6 o'clock position and some satellite nodules as well the mass was biopsied and showed invasive adenocarcinoma, triple negative breast cancer. Right axillary masses biopsied and showed invasive carcinoma and a lymph node, also triple negative. Clinical Stage TIV, N1, stage IIIB. She received neoadjuvant chemotherapy since that time but  has progressed. She has developed some satellite nodules in the central and lower breast as well as the skin below  the inframammary crease. These are draining and bleeding. Dr. Jana Hakim referred her back to proceed with mastectomy, thinking that was preferable to intensifying her chemotherapy.      Significant comorbidities include hypertension,  non-insulin-dependent diabetes, asthma, hyperlipidemia. Cerebrovascular accident earlier this year treated as outpatient. She has memory and gait problems with does walk with a cane. Mostly wheelchair. She has fallen. Past history coronary artery disease. Recently evaluated by Dr. Percival Spanish with stress testing thought to be low risk for surgical procedure. Remote abdominal hysterectomy but does not know whether her ovaries were removed. Cardiac catheterization and angioplasty with stent in the past by Dr. Einar Gip..      Nuclear medicine cardiac study is low risk with a fixed defect. Stated to be acceptable risk for planned surgical procedure. CT scan head with contrast on February 13, 2016 shows mild atrophy with no focal or acute problems. CT chest dated April 12, 2016 shows right breast mass, increased in size. Right axillary metastasis are stable. No new sites of nodal metastatic disease in the chest. 4 scattered pulmonary nodules which are felt to be stable. No new significant pulmonary nodules.       She has been seeing Dr. Maceo Pro feels that she will at least tolerate skin grafting, possible abdominal rotation flap, less likely latissimus flap. I think the tumor on her chest wall is resectable because it is mobile but I do not think we can achieve primary closure. I have discussed operative plan with Dr.Thimmappa and we are ready to proceed.      The patient and her daughter are willing to undergo the surgery. She'll be scheduled for right modified radical mastectomy with immediate reconstruction and skin coverage. I discussed the indications, details, techniques, and numerous risk of the surgery. They're aware the risk of bleeding, infection, arm  numbness, arm swelling, shoulder disability, worsening of her mental status, stroke, myocardial infarction, pulmonary and thromboembolic problems. The daughter understands that the surgery is more likely palliative and likely not curative due to its metastatic spread to lymph nodes and advanced local stage. She knows that we may get positive skin margins. She knows that she is at increased risk for wound breakdown and CV complications. She knows we are not sure she would tolerate radiation therapy. They understand these issues well. All of his questions are answered. They agree with this plan.  Hospital Course: On the day of admission the patient was taken to the operating room.  Upon induction of general anesthesia she had significant hypertension which was treated and resolved.  She had mild ST segment depression.  Dr. Ermalene Postin from anesthesia, Dr. Merlene Pulling from cardiology, and I discussed this.  It was felt that we could proceed with surgery.  We went ahead and performed a right modified radical mastectomy.  This was a fairly generous incision that went down onto the costal margin to get around all of the tumor but with some skin flap mobilization by Dr. Iran Planas we were able to achieve primary closure.       Final pathology shows  a 7.7 cm primary tumor with multiple skin and dermal nodules, 6 out of 21 lymph nodes are positive with extracapsular extension, surprisingly, all of the skin margins are negative.  This is a triple negative tumor.  Pathology was discussed with the patient's daughter.     For the first 24 hours postop she was observed in a cardiac unit and she was seen by cardiology.  It was felt that she had not suffered an event and no further management was necessary.  She was transferred to the regular floor on postop day 1 and did well.  She has a cognitive disorder and was a little  bit more confused than usual at home.  Her family stayed with her and she did well.  She was able to  ambulate in the hall.  She had no pain.  She moved her shoulder around fairly well.  The drains were functioning normally with serosanguineous drainage.  The time of discharge both upper and lower skin flaps were viable and intact.  There is no hematoma or seroma.     It should be noted that she had significant blood loss at the time of surgery due to neovascularity.  Probably loss 500 mL of blood.  She tolerated this well.  She was given 1 unit of blood postop and discharge hemoglobin is 7.8.  She is given a prescription for ferrous sulfate.  She has no shortness of breath or chest pain.    At the time of this dictation she meets all discharge criteria.  Diet and activities were discussed.  Wound care was discussed.  Daughter was present throughout this conversation.  Pathology was discussed.  She will return to see Dr. Iran Planas in one week.  She will return to see me in 2 weeks.  She is post to see Dr. Jana Hakim the first week of December.    She will continue all of her usual medications without any changes.  Because of her cognitive disorder and minimal pain I chose not to give her any narcotics and recommended Tylenol for pain.  She was given a prescription for ferrous sulfate 325 mg twice a day with meals.  Consults: cardiology  Significant Diagnostic Studies: Lab work and pathology.  EKG  Treatments: surgery: As listed above  Disposition: Home  Patient Instructions:    Activity: activity as tolerated Diet: diabetic diet Wound Care: as directed  Follow-up:  With Dr. Dalbert Batman in 2 weeks.  Signed: Edsel Petrin. Dalbert Batman, M.D., FACS General and minimally invasive surgery Breast and Colorectal Surgery  05/27/2016, 6:37 AM

## 2016-05-27 NOTE — Progress Notes (Signed)
D/C papers gone over with pt. And her daughter. JP drain education done. JP drain supplies provided as well as record sheet. Daughter states she understands. No questions/complaints. Wound care given to daughter. Supplies provided.Prescription given to pt.'s daughter. NO IV access.

## 2016-05-27 NOTE — Progress Notes (Signed)
Patient Name: Jennifer Garrett Date of Encounter: 05/27/2016  Primary Cardiologist:    [facility] Problem List     Principal Problem:   Breast cancer of lower-inner quadrant of right female breast [facility]) Active Problems:   Cancer of overlapping sites of right female breast ([place])     Subjective   She feels OK.  No chest pain.  No SOB.    Inpatient Medications    Scheduled Meds: . aspirin EC  81 mg Oral q morning - 10a  . atorvastatin  40 mg Oral Daily  . calcium carbonate  1 tablet Oral BID WC  . donepezil  5 mg Oral QHS  . enoxaparin (LOVENOX) injection  40 mg Subcutaneous Q24H  . ferrous sulfate  325 mg Oral BID WC  . hydrochlorothiazide  12.5 mg Oral Daily  . insulin aspart  0-15 Units Subcutaneous TID WC  . irbesartan  37.5 mg Oral Daily  . pantoprazole  40 mg Oral Daily  . potassium chloride SA  20 mEq Oral Daily  . senna  1 tablet Oral BID   Continuous Infusions: . lactated ringers 100 mL/hr at 05/26/16 1539   PRN Meds: albuterol, glipiZIDE, HYDROcodone-acetaminophen, methocarbamol, morphine injection, ondansetron **OR** ondansetron (ZOFRAN) IV   Vital Signs    Vitals:   05/26/16 1211 05/26/16 1540 05/26/16 2054 05/27/16 0459  BP: 111/77 (!) 106/46 (!) 132/51 (!) 160/50  Pulse: 70 67 72 66  Resp: 19 18 15 18   Temp: 98.2 F (36.8 C) 99.2 F (37.3 C) 99.7 F (37.6 C) 98.4 F (36.9 C)  TempSrc: Oral Oral Oral Oral  SpO2: 100% 97% 94% 94%  Weight:      Height:        Intake/Output Summary (Last 24 hours) at 05/27/16 0950 Last data filed at 05/27/16 0502  Gross per 24 hour  Intake              920 ml  Output              980 ml  Net              -60 ml   Filed Weights   05/25/16 0603 05/25/16 0629 05/25/16 1715  Weight: 162 lb (73.5 kg) 160 lb (72.6 kg) 164 lb 14.5 oz (74.8 kg)    Physical Exam    GEN: NAD.  Neck:  No JVD Cardiac: Regular Rate and Rhythm, No murmurs, rubs, or gallops.  No edema.  Radials/DP/PT 2+  and equal bilaterally.   Respiratory:  Respirations  regular and unlabored, clear to auscultation bilaterally. GI: Soft, nontender, nondistended, BS + x 4. Skin: warm and dry, no rash. Neuro:   Strength and sensation are intact. Psych:  AAOx3.  Normal affect.  Labs    CBC  Recent Labs  05/26/16 0218 05/27/16 0251  WBC 11.9* 11.4*  HGB 7.7* 7.8*  HCT 23.5* 24.4*  MCV 86.4 86.5  PLT 210 453   Basic Metabolic Panel  Recent Labs  05/26/16 0218 05/27/16 0251  NA 136 138  K 4.4 4.0  CL 103 104  CO2 26 28  GLUCOSE 176* 138*  BUN 12 13  CREATININE 0.94 0.99  CALCIUM 8.5* 8.7*   Liver Function Tests No results for input(s): AST, ALT, ALKPHOS, BILITOT, PROT, ALBUMIN in the last 72 hours. No results for input(s): LIPASE, AMYLASE in the last 72 hours. Cardiac Enzymes  Recent Labs  05/25/16 1639 05/25/16 1951 05/26/16 0218 05/26/16 1119  CKTOTAL 206  --   --   --  CKMB 2.2  --   --   --   TROPONINI  --  0.07* <0.03 <0.03   BNP Invalid input(s): POCBNP D-Dimer No results for input(s): DDIMER in the last 72 hours. Hemoglobin A1C No results for input(s): HGBA1C in the last 72 hours. Fasting Lipid Panel No results for input(s): CHOL, HDL, LDLCALC, TRIG, CHOLHDL, LDLDIRECT in the last 72 hours. Thyroid Function Tests No results for input(s): TSH, T4TOTAL, T3FREE, THYROIDAB in the last 72 hours.  Invalid input(s): FREET3  Telemetry    Not on tele  ECG    NA  Radiology    No results found.  Cardiac Studies   None  Patient Profile     82 yo female with PMH of remote CAD (DES to OM3, 90% RCA stenosis), HTN, HLD, breast CA, DM who presented for right breast mastectomy. While in surgery it was reported that she had an episode of SVT and noted ST depression on her monitoring strips.  Assessment & Plan    SVT:  No symptomatic arrhythmias.  She is not on telemetry.  No further change in therapy.    ELEVATED TROPONIN:  Trended down.  No evidence of ACS,  No further work up.  Call  Ms. Iona Beard with the results and send results to Philis Fendt, MD   Signed, Minus Breeding, MD  05/27/2016, 9:50 AM

## 2016-05-27 NOTE — Progress Notes (Signed)
Pt. D/c'd successfully via w/c.

## 2016-05-27 NOTE — Evaluation (Addendum)
Occupational Therapy Evaluation Patient Details Name: Jennifer Garrett MRN: [id number] DOB: December 25, 1931 Today's Date: 05/27/2016    History of Present Illness Pt is an 82 y/o female s/p R modified radical mastectomy. PMH including but not limited to HTN, CAD, DM and hx of CVA earlier this year per surgeon's H&P.   Clinical Impression   PTA, pt reports using RW for community mobility and independence with ADL and mobility in her home. Pt currently requires min guard assist for ADL and functional mobility. Additionally, pt demonstrates decreased activity tolerance at this time. Educated pt on bathing/dressing techniques and energy conservation post-operatively. She reports understanding and would benefit from continued reinforcement. Pt has support from family at home but unable to determine if they will provide intermittent or 24 hour assistance/supervision. Recommend home health OT for OT follow up to increase independence with ADL. OT will continue to follow while admitted to address increased activity tolerance for ADL to ultimately increase independence prior to D/C home.    Follow Up Recommendations  Home health OT;Supervision/Assistance - 24 hour    Equipment Recommendations  None recommended by OT (Has all needs met)       Precautions / Restrictions Precautions Precautions: Fall Restrictions Weight Bearing Restrictions: No      Mobility Bed Mobility Overal bed mobility: Needs Assistance Bed Mobility: Supine to Sit;Sit to Supine     Supine to sit: Min guard;HOB elevated Sit to supine: HOB elevated;Min guard   General bed mobility comments: Increased time, verbal cues, and min guard for safety.  Transfers Overall transfer level: Needs assistance Equipment used: Rolling walker (2 wheeled) Transfers: Sit to/from Stand Sit to Stand: Min guard         General transfer comment: VC's for hand placement and min guard assist for safety.    Balance Overall balance assessment:  Needs assistance Sitting-balance support: Feet supported;No upper extremity supported Sitting balance-Leahy Scale: Fair     Standing balance support: Single extremity supported;During functional activity Standing balance-Leahy Scale: Poor Standing balance comment: Single UE support required while standing at sink for grooming tasks.                            ADL Overall ADL's : Needs assistance/impaired Eating/Feeding: Set up;Sitting   Grooming: Wash/dry hands;Sitting   Upper Body Bathing: Min guard;Sitting   Lower Body Bathing: Min guard;Sit to/from stand   Upper Body Dressing : Min guard;Sitting   Lower Body Dressing: Min guard;Sit to/from stand   Toilet Transfer: Min guard;RW;Ambulation   Toileting- Water quality scientist and Hygiene: Min guard;Sit to/from stand       Functional mobility during ADLs: Min guard;Rolling walker General ADL Comments: Pt educated on safety during ADL post-operatively. Pt with decreased short-term memory at baseline per H&P however no caregiver present to determine baseline.     Vision Vision Assessment?: No apparent visual deficits          Pertinent Vitals/Pain Pain Assessment: No/denies pain     Hand Dominance Right   Extremity/Trunk Assessment Upper Extremity Assessment Upper Extremity Assessment: Overall WFL for tasks assessed (RUE restricted s/p mastectomy)   Lower Extremity Assessment Lower Extremity Assessment: Generalized weakness       Communication Communication Communication: No difficulties   Cognition Arousal/Alertness: Awake/alert Behavior During Therapy: WFL for tasks assessed/performed Overall Cognitive Status: No family/caregiver present to determine baseline cognitive functioning       Memory: Decreased short-term memory  Home Living Family/patient expects to be discharged to:: Private residence Living Arrangements: Children Available Help at Discharge:  Family;Available PRN/intermittently (Reports son there all the time, unsure if reliable report) Type of Home: House Home Access: Stairs to enter [employer] of Steps: 1   Home Layout: One level     Bathroom Shower/Tub: Tub/shower unit Shower/tub characteristics: Curtain       Home Equipment: Environmental consultant - 2 wheels;Cane - single point;Bedside commode;Shower seat          Prior Functioning/Environment Level of Independence: Independent with assistive device(s)        Comments: pt reported that she ambulated within her home without an AD and ambulated within her community using a RW.        OT Problem List: Decreased strength;Decreased range of motion;Decreased activity tolerance;Impaired balance (sitting and/or standing);Decreased safety awareness;Decreased knowledge of use of DME or AE;Decreased knowledge of precautions;Pain   OT Treatment/Interventions: Self-care/ADL training;Therapeutic exercise;Energy conservation;Therapeutic activities;Patient/family education;Balance training    OT Goals(Current goals can be found in the care plan section) Acute Rehab OT Goals Patient Stated Goal: return home OT Goal Formulation: With patient Time For Goal Achievement: 06/10/16 Potential to Achieve Goals: Good ADL Goals Pt Will Perform Upper Body Dressing: with modified independence;sitting Pt Will Perform Lower Body Dressing: with modified independence;sit to/from stand Pt Will Transfer to Toilet: with modified independence;ambulating;regular height toilet Pt Will Perform Toileting - Clothing Manipulation and hygiene: with modified independence;sit to/from stand Pt Will Perform Tub/Shower Transfer: shower seat;ambulating;with supervision;rolling walker  OT Frequency: Min 1X/week    End of Session Equipment Utilized During Treatment: Gait belt;Rolling walker Nurse Communication: Mobility status;Other (comment) (Pt does not have chair alarm in room)  Activity Tolerance:  Patient tolerated treatment well Patient left: in bed;with bed alarm set   Time: 0301-3143 OT Time Calculation (min): 25 min Charges:  OT General Charges $OT Visit: 1 Procedure OT Evaluation $OT Eval Moderate Complexity: 1 Procedure OT Treatments $Self Care/Home Management : 8-22 mins  Norman Herrlich, OTR/L [phone] 05/27/2016, 8:28 AM

## 2016-05-29 LAB — TYPE AND SCREEN
ABO/RH(D): A POS
ANTIBODY SCREEN: NEGATIVE
UNIT DIVISION: 0
Unit division: 0

## 2016-06-15 ENCOUNTER — Ambulatory Visit: Payer: Medicare Other | Attending: General Surgery | Admitting: Physical Therapy

## 2016-06-15 DIAGNOSIS — M6281 Muscle weakness (generalized): Secondary | ICD-10-CM | POA: Insufficient documentation

## 2016-06-15 DIAGNOSIS — R293 Abnormal posture: Secondary | ICD-10-CM | POA: Insufficient documentation

## 2016-06-15 DIAGNOSIS — R222 Localized swelling, mass and lump, trunk: Secondary | ICD-10-CM | POA: Insufficient documentation

## 2016-06-15 DIAGNOSIS — M25611 Stiffness of right shoulder, not elsewhere classified: Secondary | ICD-10-CM | POA: Insufficient documentation

## 2016-06-15 NOTE — Therapy (Signed)
Jennifer Garrett, [place], [address] Phone: [phone]   Fax:  [phone]  Physical Therapy Evaluation  Patient Details  Name: Jennifer Garrett MRN: [id number] Date of Birth: 80/10/1931 Referring Provider: Dr. Fanny Skates   Encounter Date: 06/15/2016      PT End of Session - 06/15/16 1259    Visit Number 1   Number of Visits 9   PT Start Time 1030  room not available til 10:30   PT Stop Time 1105   PT Time Calculation (min) 35 min   Activity Tolerance Patient tolerated treatment well   Behavior During Therapy [facility] for tasks assessed/performed      Past Medical History:  Diagnosis Date  . Arthritis   . Asthma    rarely uses neb  . Breast cancer of lower-inner quadrant of right female breast ([place]) 01/22/2016  . Carpal tunnel syndrome   . Coronary artery disease    DES to OM3 90% stenosis 2003, 90% small RCA stenosis.    . Diabetes mellitus   . History of stomach ulcers   . Hyperlipemia   . Hypertension   . Irregular heart beat     Past Surgical History:  Procedure Laterality Date  . ABDOMINAL HYSTERECTOMY    . CARDIAC CATHETERIZATION  2003   stent-  . CARPAL TUNNEL RELEASE  03/29/2012   Procedure: CARPAL TUNNEL RELEASE;  Surgeon: Wynonia Sours, MD;  Location: [facility];  Service: Orthopedics;  Laterality: Left;  . EYE SURGERY     catracts  . LESION EXCISION WITH COMPLEX REPAIR Right 05/25/2016   Procedure: complex repair of 25cm wound;  Surgeon: Irene Limbo, MD;  Location: [facility];  Service: Plastics;  Laterality: Right;  . MASTECTOMY MODIFIED RADICAL Right 05/25/2016   Procedure: RIGHT MASTECTOMY MODIFIED RADICAL;  Surgeon: Fanny Skates, MD;  Location: [facility];  Service: General;  Laterality: Right;  . SHOULDER ARTHROSCOPY     right and left  . TRIGGER FINGER RELEASE  03/29/2012   Procedure: RELEASE TRIGGER FINGER/A-1 PULLEY;  Surgeon: Wynonia Sours, MD;  Location: [facility];  Service: Orthopedics;  Laterality: Left;    There were no vitals filed for this visit.       Subjective Assessment - 06/15/16 1042    Subjective pt says she is doing OK after breast surgery,   Patient is accompained by: Family member  daugter Jennifer Garrett    Pertinent History Hx includes memory problems and multiple medical problem including hip pain.  Breast cancer found in June 2017, did not respond to chemotherapy.  Extended right radical mastectomy with cmplex wound closure on Nov. 7, 2017 with 6 out of 21 nodes positive.    Patient Stated Goals daughter wants pt to get stronger and walk better and to get right arm stronger.   Currently in Pain? No/denies            [facility] PT Assessment - 06/15/16 0001      Assessment   Medical Diagnosis right breast cancer    Referring Provider Dr. Fanny Skates    Onset Date/Surgical Date 05/25/16   Hand Dominance Right   Prior Therapy Just completed home health OT/PT     Precautions   Precautions Other (comment)   Precaution Comments lifting precautions      Restrictions   Weight Bearing Restrictions No     Balance Screen   Has the patient fallen in the past 6 months No  Has the patient had a decrease in activity level because of a fear of falling?  No   Is the patient reluctant to leave their home because of a fear of falling?  No     Home Social worker Private residence   Living Arrangements Parent  pt lives with daughter.    Available Help at Discharge Available 24 hours/day   Type of Home House   Home Access Stairs to enter   Entrance Stairs-Number of Steps 2     Prior Function   Level of Independence Independent with basic ADLs  just completed home health OT      Cognition   Overall Cognitive Status History of cognitive impairments - at baseline   Memory Impaired   Memory Impairment --  daughter reports short term memory problems    Behaviors Other (comment)  limited voluntary  conversation, lets daughter answer for her     Observation/Other Assessments   Observations Pt comes in wearing pink compression bandeau with ABD across incision and drain sites. healing incision across right chest with scabbing in multple areas.  skin under axilla is contated and less moveable. She has some fullness at right posterior axilla and upper lateral chest    Skin Integrity no open areas    Quick DASH  38.64     Sensation   Light Touch Not tested     Coordination   Gross Motor Movements are Fluid and Coordinated No  minor slowness with movement      Posture/Postural Control   Posture/Postural Control Postural limitations   Postural Limitations Increased thoracic kyphosis     ROM / Strength   AROM / PROM / Strength AROM;Strength     AROM   Overall AROM  Within functional limits for tasks performed   Overall AROM Comments pt appears to have generalized muscle deconditioning    Right Shoulder Flexion 114 Degrees   Right Shoulder ABduction 90 Degrees   Right Shoulder External Rotation 80 Degrees   Left Shoulder Flexion 145 Degrees   Left Shoulder ABduction 110 Degrees   Left Shoulder External Rotation 70 Degrees     Strength   Overall Strength Deficits   Right/Left Shoulder Right   Right Shoulder Flexion 3-/5   Right Shoulder ABduction 3-/5   Left Shoulder Flexion 3+/5   Left Shoulder ABduction 3+/5     Ambulation/Gait   Assistive device None  pt states she uses a cane occasionally    Gait Pattern Step-through pattern;Decreased step length - right;Decreased step length - left;Wide base of support   Ambulation Surface Level;Indoor   Gait velocity .50m/s : limited community ambulator            LYMPHEDEMA/ONCOLOGY QUESTIONNAIRE - 06/15/16 1054      Right Upper Extremity Lymphedema   10 cm Proximal to Olecranon Process 33.5 cm   Olecranon Process 27 cm   10 cm Proximal to Ulnar Styloid Process 26 cm   Just Proximal to Ulnar Styloid Process 16 cm   Across  Hand at [employer] 20 cm   At [place] of 2nd Digit 6.2 cm     Left Upper Extremity Lymphedema   Olecranon Process 26.5 cm   15 cm Proximal to Ulnar Styloid Process 25.5 cm   Just Proximal to Ulnar Styloid Process 16 cm   Across Hand at [employer] 20 cm   At [place] of 2nd Digit 6 cm  Jennifer Garrett - 06/15/16 0001    Open a tight or new jar Severe difficulty   Do heavy household chores (wash walls, wash floors) Unable   Carry a shopping bag or briefcase No difficulty   Wash your back Severe difficulty   Use a knife to cut food Mild difficulty   Recreational activities in which you take some force or impact through your arm, shoulder, or hand (golf, hammering, tennis) Severe difficulty   During the past week, to what extent has your arm, shoulder or hand problem interfered with your normal social activities with family, friends, neighbors, or groups? Modererately   During the past week, to what extent has your arm, shoulder or hand problem limited your work or other regular daily activities Not at all   Arm, shoulder, or hand pain. Mild   Tingling (pins and needles) in your arm, shoulder, or hand None   Difficulty Sleeping No difficulty   DASH Score 38.64 %                             Long Term Clinic Goals - 06/15/16 1419      CC Long Term Goal  #1   Title Patient / daughter will verbalize an understanding of lymphedema risk reduction precautions   Time 4   Period Weeks   Status New     CC Long Term Goal  #2   Title Patient will be independent in a  home exercise program for right shoulder range of motion and strength, LEstrength and general strength.   Time 4   Period Weeks   Status New     CC Long Term Goal  #3   Title Pt will decrease Quick DASH score to 25 indicating an improvment in Right UE function   Baseline 38.64   Time 4   Period Weeks   Status New     CC Long Term Goal  #4   Title Pt will increase right shoulder abduction  to 125 so that she will be able to receive radiation treatment if needed.    Baseline 110 degrees   Time 4   Period Weeks   Status New            Plan - 06/15/16 1406    Clinical Impression Statement 82 yo female with cormorbidities of memory problems and other medical issues including DM and arthritis has had a reacent right mastectomy with 21 lymph nodes removed.  She may need to have radiation and futher chemotherapy so expect condition to be evolving, but stable.  She has decrease rigth shoulder range of motion and strength with overal deconditioning evidenced by decreased gait velocity.  Becasue of these factors this eval was of  moderate complexity.    Rehab Potential Good   Clinical Impairments Affecting Rehab Potential 21 lymph nodes removed, dereased memory    PT Frequency 2x / week   PT Duration 4 weeks   PT Treatment/Interventions ADLs/Self Care Home Management;Patient/family education;Orthotic Fit/Training;DME Instruction;Manual techniques;Therapeutic activities;Manual lymph drainage;Therapeutic exercise;Scar mobilization;Passive range of motion   PT Next Visit Plan Active, Assisted and Passive range of motion to right shoulder with instruction in HEP and manual techniques as indicated. Consider Nustep , Instruct pt/daughter in ABC class and assist with getting them signed up. then, instruct in hip stregthening exercise and teach LE HEP, Explore community excercise options at [place] and Agree with Plan of Care Patient;Family member/caregiver  Family Member Lincolnia      Patient will benefit from skilled therapeutic intervention in order to improve the following deficits and impairments:  Decreased endurance, Abnormal gait, Increased edema, Decreased knowledge of precautions, Decreased activity tolerance, Decreased knowledge of use of DME, Impaired UE functional use, Decreased strength, Difficulty walking, Decreased cognition, Decreased range of motion, Postural  dysfunction  Visit Diagnosis: Stiffness of right shoulder, not elsewhere classified - Plan: PT plan of care cert/re-cert  Abnormal posture - Plan: PT plan of care cert/re-cert  Muscle weakness (generalized) - Plan: PT plan of care cert/re-cert  Localized swelling, mass and lump, trunk - Plan: PT plan of care cert/re-cert      G-Codes - 53/29/92 1300    Functional Assessment Tool Used Quick DASH    Functional Limitation Carrying, moving and handling objects   Carrying, Moving and Handling Objects Current Status (E2683) At least 40 percent but less than 60 percent impaired, limited or restricted   Carrying, Moving and Handling Objects Goal Status (M1962) At least 20 percent but less than 40 percent impaired, limited or restricted       Problem List Patient Active Problem List   Diagnosis Date Noted  . Cancer of overlapping sites of right female breast ([place]) 05/25/2016  . Breast cancer of lower-inner quadrant of right female breast ([place]) 01/22/2016   Donato Heinz. Owens Shark PT  Norwood Levo 06/15/2016, 2:32 PM  [place], [place], [address] Phone: [phone]   Fax:  [phone]  Name: NIURKA BENECKE MRN: [id number] Date of Birth: [birth year]

## 2016-06-16 ENCOUNTER — Ambulatory Visit: Payer: Medicare Other

## 2016-06-16 DIAGNOSIS — R222 Localized swelling, mass and lump, trunk: Secondary | ICD-10-CM

## 2016-06-16 DIAGNOSIS — M25611 Stiffness of right shoulder, not elsewhere classified: Secondary | ICD-10-CM | POA: Diagnosis not present

## 2016-06-16 DIAGNOSIS — R293 Abnormal posture: Secondary | ICD-10-CM

## 2016-06-16 DIAGNOSIS — M6281 Muscle weakness (generalized): Secondary | ICD-10-CM

## 2016-06-16 NOTE — Therapy (Signed)
[place], [place], [address] Phone: [phone]   Fax:  [phone]  Physical Therapy Treatment  Patient Details  Name: Jennifer Garrett MRN: [id number] Date of Birth: 05/01/1932 Referring Provider: Dr. Fanny Skates   Encounter Date: 06/16/2016      PT End of Session - 06/16/16 1348    Visit Number 2   Number of Visits 9   PT Start Time 1301   PT Stop Time 5397   PT Time Calculation (min) 46 min   Activity Tolerance Patient tolerated treatment well   Behavior During Therapy [facility] for tasks assessed/performed      Past Medical History:  Diagnosis Date  . Arthritis   . Asthma    rarely uses neb  . Breast cancer of lower-inner quadrant of right female breast ([place]) 01/22/2016  . Carpal tunnel syndrome   . Coronary artery disease    DES to OM3 90% stenosis 2003, 90% small RCA stenosis.    . Diabetes mellitus   . History of stomach ulcers   . Hyperlipemia   . Hypertension   . Irregular heart beat     Past Surgical History:  Procedure Laterality Date  . ABDOMINAL HYSTERECTOMY    . CARDIAC CATHETERIZATION  2003   stent-  . CARPAL TUNNEL RELEASE  03/29/2012   Procedure: CARPAL TUNNEL RELEASE;  Surgeon: Wynonia Sours, MD;  Location: [facility];  Service: Orthopedics;  Laterality: Left;  . EYE SURGERY     catracts  . LESION EXCISION WITH COMPLEX REPAIR Right 05/25/2016   Procedure: complex repair of 25cm wound;  Surgeon: Irene Limbo, MD;  Location: [facility];  Service: Plastics;  Laterality: Right;  . MASTECTOMY MODIFIED RADICAL Right 05/25/2016   Procedure: RIGHT MASTECTOMY MODIFIED RADICAL;  Surgeon: Fanny Skates, MD;  Location: [facility];  Service: General;  Laterality: Right;  . SHOULDER ARTHROSCOPY     right and left  . TRIGGER FINGER RELEASE  03/29/2012   Procedure: RELEASE TRIGGER FINGER/A-1 PULLEY;  Surgeon: Wynonia Sours, MD;  Location: [facility];  Service:  Orthopedics;  Laterality: Left;    There were no vitals filed for this visit.      Subjective Assessment - 06/16/16 1303    Subjective No changes from yesterday. Rt chest/axilla feels tight.    Patient is accompained by: Family member  daughter Jennifer Garrett   Pertinent History Hx includes memory problems and multiple medical problem including hip pain.  Breast cancer found in June 2017, did not respond to chemotherapy.  Extended right radical mastectomy with cmplex wound closure on Nov. 7, 2017 with 6 out of 21 nodes positive.    Patient Stated Goals daughter wants pt to get stronger and walk better and to get right arm stronger.   Currently in Pain? No/denies               LYMPHEDEMA/ONCOLOGY QUESTIONNAIRE - 06/16/16 1305      Right Upper Extremity Lymphedema   10 cm Proximal to Olecranon Process 33.1 cm   Olecranon Process 25.5 cm   10 cm Proximal to Ulnar Styloid Process 21.2 cm   Just Proximal to Ulnar Styloid Process 15.7 cm   Across Hand at [employer] 19.4 cm   At [place] of 2nd Digit 6.1 cm     Left Upper Extremity Lymphedema   10 cm Proximal to Olecranon Process 30.2 cm   Olecranon Process 25 cm   10 cm Proximal  to Ulnar Styloid Process 20.4 cm   Just Proximal to Ulnar Styloid Process 16.4 cm   Across Hand at [employer] 18.8 cm   At [place] of 2nd Digit 6 cm                  [facility] Adult PT Treatment/Exercise - 06/16/16 0001      Shoulder Exercises: Supine   Horizontal ABduction AAROM;Right;5 reps  5 second holds with dowel rod   External Rotation AAROM;Right;5 reps  5 second holds with dowel rod   External Rotation Limitations Placed small towel roll for pt to hold at side to help keep UE in proper position.   Flexion AAROM;Both;5 reps  5 second holds with dowel rod     Manual Therapy   Myofascial Release Rt UE pulling during P/ROM   Manual Lymphatic Drainage (MLD) In Supine: Short neck, Rt inguinal nodes and Lt axilla nodes, Rt  axillo-inguinal and anterior inter-axillary anastomosis then Rt upper arm retracing all steps.    Passive ROM In Supine to Rt shoulder into flexion, abduction and er to pts tolerance.                PT Education - 06/16/16 1348    Education provided Yes   Education Details Supine cane exercises   Person(s) Educated Patient;Child(ren)  Daughter Jennifer Garrett   Methods Explanation;Demonstration;Handout   Comprehension Verbalized understanding;Returned demonstration;Need further instruction                [facility] Goals - 06/15/16 1419      CC Long Term Goal  #1   Title Patient / daughter will verbalize an understanding of lymphedema risk reduction precautions   Time 4   Period Weeks   Status New     CC Long Term Goal  #2   Title Patient will be independent in a  home exercise program for right shoulder range of motion and strength, LEstrength and general strength.   Time 4   Period Weeks   Status New     CC Long Term Goal  #3   Title Pt will decrease Quick DASH score to 25 indicating an improvment in Right UE function   Baseline 38.64   Time 4   Period Weeks   Status New     CC Long Term Goal  #4   Title Pt will increase right shoulder abduction to 125 so that she will be able to receive radiation treatment if needed.    Baseline 110 degrees   Time 4   Period Weeks   Status New            Plan - 06/16/16 1349    Clinical Impression Statement Pt tolerated first session of stretching well and did well with supine cane exercises so issued these for HEP.    Rehab Potential Good   Clinical Impairments Affecting Rehab Potential 21 lymph nodes removed, dereased memory    PT Frequency 2x / week   PT Duration 4 weeks   PT Treatment/Interventions ADLs/Self Care Home Management;Patient/family education;Orthotic Fit/Training;DME Instruction;Manual techniques;Therapeutic activities;Manual lymph drainage;Therapeutic exercise;Scar mobilization;Passive range of  motion   PT Next Visit Plan A/AA/P/ROM to right shoulder with review of HEP and manual techniques as indicated. Consider Nustep , Instruct pt/daughter in ABC class and assist with getting them signed up. then, instruct in hip stregthening exercise and teach LE HEP, Explore community excercise options at [place] see education section  Consulted and Agree with Plan of Care Patient;Family member/caregiver   Family Member Consulted Margaret      Patient will benefit from skilled therapeutic intervention in order to improve the following deficits and impairments:  Decreased endurance, Abnormal gait, Increased edema, Decreased knowledge of precautions, Decreased activity tolerance, Decreased knowledge of use of DME, Impaired UE functional use, Decreased strength, Difficulty walking, Decreased cognition, Decreased range of motion, Postural dysfunction  Visit Diagnosis: Stiffness of right shoulder, not elsewhere classified  Abnormal posture  Muscle weakness (generalized)  Localized swelling, mass and lump, trunk       G-Codes - 07-12-16 1300    Functional Assessment Tool Used Quick DASH    Functional Limitation Carrying, moving and handling objects   Carrying, Moving and Handling Objects Current Status (H0865) At least 40 percent but less than 60 percent impaired, limited or restricted   Carrying, Moving and Handling Objects Goal Status (H8469) At least 20 percent but less than 40 percent impaired, limited or restricted      Problem List Patient Active Problem List   Diagnosis Date Noted  . Cancer of overlapping sites of right female breast ([place]) 05/25/2016  . Breast cancer of lower-inner quadrant of right female breast ([place] 01/22/2016    Otelia Limes, PTA 06/16/2016, 1:53 PM  [place], [place], [address] Phone: [phone]   Fax:  [phone]  Name: REYANNE HUSSAR MRN: [id number] Date of Birth: Sep 12, 1931

## 2016-06-16 NOTE — Patient Instructions (Addendum)
Cancer Rehab [phone] SHOULDER: External Rotation - Supine (Cane)  Hold cane with both hands. Rotate arm away from body. Keep elbow on floor and next to body. __5-10_ reps per set hold 5 seconds, _2-3__ sets per day.  Add towel to keep elbow at side.  Copyright  VHI. All rights reserved.  Cane Horizontal - Supine  With straight arms holding cane above shoulders, bring cane out to right, center, out to left, and back to above head. Repeat _5-10__ times holding 5 seconds.. Do _2-3__ times per day.  Copyright  VHI. All rights reserved.  Cane Exercise: Flexion  Lie on back, holding cane above chest. Keeping arms as straight as possible, lower cane toward floor beyond head. Hold __5__ seconds. Repeat _5-10___ times. Do _2-3___ sessions per day.  [email or web address].exer.[email or web address]   Copyright  VHI. All rights reserved.

## 2016-06-23 ENCOUNTER — Other Ambulatory Visit (HOSPITAL_BASED_OUTPATIENT_CLINIC_OR_DEPARTMENT_OTHER): Payer: Medicare Other

## 2016-06-23 ENCOUNTER — Ambulatory Visit (HOSPITAL_BASED_OUTPATIENT_CLINIC_OR_DEPARTMENT_OTHER): Payer: Medicare Other | Admitting: Oncology

## 2016-06-23 ENCOUNTER — Ambulatory Visit: Payer: Medicare Other | Attending: General Surgery

## 2016-06-23 VITALS — BP 132/50 | HR 58 | Temp 98.0°F | Resp 18 | Ht 62.5 in | Wt 155.3 lb

## 2016-06-23 DIAGNOSIS — C7801 Secondary malignant neoplasm of right lung: Secondary | ICD-10-CM

## 2016-06-23 DIAGNOSIS — R918 Other nonspecific abnormal finding of lung field: Secondary | ICD-10-CM | POA: Diagnosis not present

## 2016-06-23 DIAGNOSIS — R293 Abnormal posture: Secondary | ICD-10-CM | POA: Diagnosis present

## 2016-06-23 DIAGNOSIS — C50311 Malignant neoplasm of lower-inner quadrant of right female breast: Secondary | ICD-10-CM

## 2016-06-23 DIAGNOSIS — M25611 Stiffness of right shoulder, not elsewhere classified: Secondary | ICD-10-CM

## 2016-06-23 DIAGNOSIS — M6281 Muscle weakness (generalized): Secondary | ICD-10-CM

## 2016-06-23 DIAGNOSIS — C50811 Malignant neoplasm of overlapping sites of right female breast: Secondary | ICD-10-CM

## 2016-06-23 DIAGNOSIS — R222 Localized swelling, mass and lump, trunk: Secondary | ICD-10-CM | POA: Diagnosis present

## 2016-06-23 DIAGNOSIS — C773 Secondary and unspecified malignant neoplasm of axilla and upper limb lymph nodes: Secondary | ICD-10-CM | POA: Diagnosis not present

## 2016-06-23 DIAGNOSIS — Z171 Estrogen receptor negative status [ER-]: Secondary | ICD-10-CM | POA: Diagnosis not present

## 2016-06-23 DIAGNOSIS — C7800 Secondary malignant neoplasm of unspecified lung: Secondary | ICD-10-CM | POA: Insufficient documentation

## 2016-06-23 LAB — CBC WITH DIFFERENTIAL/PLATELET
BASO%: 0.8 % (ref 0.0–2.0)
Basophils Absolute: 0.1 10*3/uL (ref 0.0–0.1)
EOS ABS: 0.3 10*3/uL (ref 0.0–0.5)
EOS%: 4 % (ref 0.0–7.0)
HCT: 29.4 % — ABNORMAL LOW (ref 34.8–46.6)
HEMOGLOBIN: 9.2 g/dL — AB (ref 11.6–15.9)
LYMPH%: 41.1 % (ref 14.0–49.7)
MCH: 27.1 pg (ref 25.1–34.0)
MCHC: 31.3 g/dL — ABNORMAL LOW (ref 31.5–36.0)
MCV: 86.5 fL (ref 79.5–101.0)
MONO#: 0.8 10*3/uL (ref 0.1–0.9)
MONO%: 12.6 % (ref 0.0–14.0)
NEUT%: 41.5 % (ref 38.4–76.8)
NEUTROS ABS: 2.7 10*3/uL (ref 1.5–6.5)
PLATELETS: 220 10*3/uL (ref 145–400)
RBC: 3.4 10*6/uL — AB (ref 3.70–5.45)
RDW: 13.1 % (ref 11.2–14.5)
WBC: 6.5 10*3/uL (ref 3.9–10.3)
lymph#: 2.7 10*3/uL (ref 0.9–3.3)

## 2016-06-23 LAB — COMPREHENSIVE METABOLIC PANEL
ALBUMIN: 3.1 g/dL — AB (ref 3.5–5.0)
ALK PHOS: 74 U/L (ref 40–150)
ALT: 12 U/L (ref 0–55)
ANION GAP: 9 meq/L (ref 3–11)
AST: 19 U/L (ref 5–34)
BILIRUBIN TOTAL: 0.29 mg/dL (ref 0.20–1.20)
BUN: 13.3 mg/dL (ref 7.0–26.0)
CO2: 26 meq/L (ref 22–29)
CREATININE: 0.9 mg/dL (ref 0.6–1.1)
Calcium: 9.2 mg/dL (ref 8.4–10.4)
Chloride: 108 mEq/L (ref 98–109)
EGFR: 68 mL/min/{1.73_m2} — AB (ref >90)
GLUCOSE: 161 mg/dL — AB (ref 70–140)
Potassium: 4.3 mEq/L (ref 3.5–5.1)
SODIUM: 143 meq/L (ref 136–145)
TOTAL PROTEIN: 7.3 g/dL (ref 6.4–8.3)

## 2016-06-23 NOTE — Progress Notes (Signed)
.  [facility]  [phone] [phone]     ID: Jennifer Garrett DOB: 1932/07/05  MR#: [id number]  HYI#:[id number]  Patient Care Team: Nolene Ebbs, MD as PCP - General (Internal Medicine) Fanny Skates, MD as Consulting Physician (General Surgery) Chauncey Cruel, MD as Consulting Physician (Oncology) Minus Breeding, MD as Consulting Physician (Cardiology) Irene Limbo, MD as Consulting Physician (Plastic Surgery) PCP: Philis Fendt, MD Chauncey Cruel, MD GYN: SU:  OTHER MD:  CHIEF COMPLAINT: Locally advanced triple negative breast cancer  CURRENT TREATMENT: Awaiting adjuvant radiation   BREAST CANCER HISTORY: From the original intake note:  The patient presented to the emergency room 11/20/2015 with a complaint of a mass in the lower aspect of her right breast, which was painful and bleeding. The patient stated the mass had been present approximately a month. An ultrasound of the breast was obtained which found a 4.4 cm mass in the right breast at the 6:00 position. This was read as most consistent with an abscess. The patient was started on clindamycin and referred to the [facility], where on 01/05/2016 she underwent bilateral diagnostic mammography with tomography and right  ultrasonography.   Mammography showed far posteriorly in the 5:00 region of the right breast a 4.1 cm mass with enlarged axillary adenopathy. On exam there was a fungating ulcerated bleeding mass in the right breast at the 5:00 position 7 cm from the nipple. The right axilla was "full" on palpation. Ultrasound showed a 5.4 cm mass at the 5:00 position of the right breast 7 cm from the nipple, with at least 3 enlarged abnormal axillary lymph nodes, the largest measuring 3.7 cm.  Biopsy of the right breast mass and one axillary lymph node on 01/06/2016 showed ([id number]) both to be positive for an invasive ductal carcinoma, grade 3, triple negative, with the HER-2  signals ratio between 1.21-1.40 and the number per cell 2.85-3.00. The MIB-1-2 was 70%.  The patient's subsequent history is as detailed below  INTERVAL HISTORY:  Jennifer Garrett returns today with her daughter Jennifer Garrett for follow-up of Jennifer Garrett's breast cancer. Since her last visit here Jennifer Garrett underwent a right modified radical mastectomy, on 05/25/2016. The final pathology from this procedure (SZA [id number]) showed an invasive ductal carcinoma, grade 3, measuring 7.7 cm. Margins were negative, although 6 out of 21 lymph nodes removed were positive, with evidence of extracapsular extension. I have requested repeat prognostic panel on this tissue, though I expected to be still triple negative.   REVIEW OF SYSTEMS: [name] did remarkably well with her surgery, without unusual pain, fever, or bleeding complications. According to her daghter, the patient is back to her baseline functional status, sweeping the rock and doing some dishes, but not doing any cooking or heavy housework. Currently she is not having any shortness of breath, cough, pleurisy, or hemoptysis.A detailed review of systems today was otherwise stable  PAST MEDICAL HISTORY: Past Medical History:  Diagnosis Date  . Arthritis   . Asthma    rarely uses neb  . Breast cancer of lower-inner quadrant of right female breast ([place]) 01/22/2016  . Carpal tunnel syndrome   . Coronary artery disease    DES to OM3 90% stenosis 2003, 90% small RCA stenosis.    . Diabetes mellitus   . History of stomach ulcers   . Hyperlipemia   . Hypertension   . Irregular heart beat     PAST SURGICAL HISTORY: Past Surgical History:  Procedure Laterality Date  . ABDOMINAL  HYSTERECTOMY    . CARDIAC CATHETERIZATION  2003   stent-  . CARPAL TUNNEL RELEASE  03/29/2012   Procedure: CARPAL TUNNEL RELEASE;  Surgeon: Wynonia Sours, MD;  Location: [facility];  Service: Orthopedics;  Laterality: Left;  . EYE SURGERY     catracts  . LESION EXCISION WITH COMPLEX REPAIR  Right 05/25/2016   Procedure: complex repair of 25cm wound;  Surgeon: Irene Limbo, MD;  Location: [facility];  Service: Plastics;  Laterality: Right;  . MASTECTOMY MODIFIED RADICAL Right 05/25/2016   Procedure: RIGHT MASTECTOMY MODIFIED RADICAL;  Surgeon: Fanny Skates, MD;  Location: [facility];  Service: General;  Laterality: Right;  . SHOULDER ARTHROSCOPY     right and left  . TRIGGER FINGER RELEASE  03/29/2012   Procedure: RELEASE TRIGGER FINGER/A-1 PULLEY;  Surgeon: Wynonia Sours, MD;  Location: [facility];  Service: Orthopedics;  Laterality: Left;    FAMILY HISTORY Family History  Problem Relation Age of Onset  . Heart disease Mother     No details   The patient's father died from "old age" at age 41. The patient's mother died at age 7 from complications of high blood pressure and diabetes. The patient had one brother, 4 sisters. There is no cancer in the family to her knowledge   GYNECOLOGIC HISTORY:  No LMP recorded. Patient has had a hysterectomy.  menarche age 84, first live birth age 46. The patient is GX P2. She underwent abdominal hysterectomy with bilateral salpingo-oophorectomy in her 70s. She did not undergo hormone replacement  SOCIAL HISTORY:  Keary used to work for [employer]. She lives with her daughter Jennifer Garrett, who is Medical sales representative for [employer]. The patient's son Jennifer Garrett lives in [place]. The patient has 1 grandchild, no great-grandchildren. She attends a local [place] DIRECTIVES:  not in place. At the 01/22/2016 visit the patient was given the appropriate documents to complete. She tells me she intends to name her daughter Jennifer Garrett as her healthcare part of attorney    HEALTH MAINTENANCE: Social History  Substance Use Topics  . Smoking status: Former Smoker    Quit date: 03/24/1978  . Smokeless tobacco: Never Used  . Alcohol use No     Colonoscopy: Never   PAP: status post hysterectomy   Bone  density: Never    Allergies  Allergen Reactions  . Lyrica [Pregabalin] Swelling    SWELLING REACTION UNSPECIFIED   . Feldene [Piroxicam] Rash  . Lisinopril Other (See Comments)    States that it makes her "sensitive to light"  . Orudis [Ketoprofen] Rash  . Vibramycin [Doxycycline Calcium] Rash and Hives    Current Outpatient Prescriptions  Medication Sig Dispense Refill  . albuterol (PROVENTIL HFA;VENTOLIN HFA) 108 (90 BASE) MCG/ACT inhaler Inhale 1 puff into the lungs every 6 (six) hours as needed for wheezing or shortness of breath. Reported on 02/04/2016    . aspirin EC 81 MG tablet Take 1 tablet by mouth every morning.    Marland Kitchen atorvastatin (LIPITOR) 40 MG tablet Take 1 tablet (40 mg total) by mouth daily. 90 tablet 3  . Blood Glucose Monitoring Suppl (ACCU-CHEK AVIVA PLUS) w/Device KIT 1 Device by Does not apply route 2 (two) times daily. 1 kit 0  . calcium carbonate (OS-CAL) 600 MG TABS Take 600 mg by mouth 2 (two) times daily with a meal.    . diclofenac sodium (VOLTAREN) 1 % GEL Apply 1 application topically  every other day.    . donepezil (ARICEPT) 5 MG tablet Take 5 mg by mouth at bedtime.    . ferrous sulfate 325 (65 FE) MG tablet Take 1 tablet (325 mg total) by mouth 2 (two) times daily with a meal. 60 tablet 3  . glipiZIDE (GLUCOTROL) 10 MG tablet Take 10 mg by mouth daily as needed (for high blood sugar (greater than 150)).     Marland Kitchen glucose blood (ACCU-CHEK AVIVA) test strip Use as instructed 100 each 12  . hydrochlorothiazide (MICROZIDE) 12.5 MG capsule Take 12.5 mg by mouth daily.    Marland Kitchen KLOR-CON M20 20 MEQ tablet Take 20 mEq by mouth daily.    . Lancets (ACCU-CHEK SOFT TOUCH) lancets Use as instructed 100 each 12  . naproxen sodium (ANAPROX) 220 MG tablet Take 220 mg by mouth 2 (two) times daily as needed (for pain.).    Marland Kitchen olmesartan (BENICAR) 40 MG tablet Take 40 mg by mouth daily.     No current facility-administered medications for this visit.     OBJECTIVE: Older  African-American woman in no acute distress Vitals:   06/23/16 0815  BP: (!) 132/50  Pulse: (!) 58  Resp: 18  Temp: 98 F (36.7 C)     Body mass index is 27.95 kg/m.    ECOG FS:1 - Symptomatic but completely ambulatory  Sclerae unicteric, EOMs intact Oropharynx clear and moist No cervical or supraclavicular adenopathy Lungs no rales or rhonchi Heart regular rate and rhythm Abd soft, nontender, positive bowel sounds MSK no focal spinal tenderness, no upper extremity lymphedema Neuro: nonfocal, well oriented, appropriate affect Breasts: the right breast is status post mastectomy. The incision is healing nicely. There is no evidence of local recurrence. I have photographed this below for future reference. The right axilla is benign. Left breast is unremarkable.    Right axilla and chest wall post-op, 06/23/2016    Photo right inframammary fold 04/14/2016    Photo dorsum right breast 04/14/2016       Right breast 03/05/2016    LAB RESULTS:  CMP     Component Value Date/Time   NA 138 05/27/2016 0251   NA 141 04/14/2016 0758   K 4.0 05/27/2016 0251   K 4.5 04/14/2016 0758   CL 104 05/27/2016 0251   CO2 28 05/27/2016 0251   CO2 23 04/14/2016 0758   GLUCOSE 138 (H) 05/27/2016 0251   GLUCOSE 148 (H) 04/14/2016 0758   BUN 13 05/27/2016 0251   BUN 16.4 04/14/2016 0758   CREATININE 0.99 05/27/2016 0251   CREATININE 1.1 04/14/2016 0758   CALCIUM 8.7 (L) 05/27/2016 0251   CALCIUM 9.2 04/14/2016 0758   PROT 7.3 05/19/2016 1130   PROT 7.5 04/14/2016 0758   ALBUMIN 3.6 05/19/2016 1130   ALBUMIN 3.3 (L) 04/14/2016 0758   AST 32 05/19/2016 1130   AST 23 04/14/2016 0758   ALT 13 (L) 05/19/2016 1130   ALT 10 04/14/2016 0758   ALKPHOS 50 05/19/2016 1130   ALKPHOS 62 04/14/2016 0758   BILITOT 0.4 05/19/2016 1130   BILITOT 0.37 04/14/2016 0758   GFRNONAA 52 (L) 05/27/2016 0251   GFRAA 60 (L) 05/27/2016 0251    INo results found for: SPEP, UPEP  Lab Results    Component Value Date   WBC 11.4 (H) 05/27/2016   NEUTROABS 2.6 05/19/2016   HGB 7.8 (L) 05/27/2016   HCT 24.4 (L) 05/27/2016   MCV 86.5 05/27/2016   PLT 212 05/27/2016  Chemistry      Component Value Date/Time   NA 138 05/27/2016 0251   NA 141 04/14/2016 0758   K 4.0 05/27/2016 0251   K 4.5 04/14/2016 0758   CL 104 05/27/2016 0251   CO2 28 05/27/2016 0251   CO2 23 04/14/2016 0758   BUN 13 05/27/2016 0251   BUN 16.4 04/14/2016 0758   CREATININE 0.99 05/27/2016 0251   CREATININE 1.1 04/14/2016 0758      Component Value Date/Time   CALCIUM 8.7 (L) 05/27/2016 0251   CALCIUM 9.2 04/14/2016 0758   ALKPHOS 50 05/19/2016 1130   ALKPHOS 62 04/14/2016 0758   AST 32 05/19/2016 1130   AST 23 04/14/2016 0758   ALT 13 (L) 05/19/2016 1130   ALT 10 04/14/2016 0758   BILITOT 0.4 05/19/2016 1130   BILITOT 0.37 04/14/2016 0758       No results found for: LABCA2  No components found for: LABCA125  No results for input(s): INR in the last 168 hours.  Urinalysis    Component Value Date/Time   COLORURINE YELLOW 05/19/2016 1138   APPEARANCEUR CLEAR 05/19/2016 1138   LABSPEC 1.017 05/19/2016 1138   PHURINE 5.5 05/19/2016 1138   GLUCOSEU NEGATIVE 05/19/2016 1138   HGBUR NEGATIVE 05/19/2016 1138   BILIRUBINUR NEGATIVE 05/19/2016 1138   KETONESUR NEGATIVE 05/19/2016 1138   PROTEINUR NEGATIVE 05/19/2016 1138   UROBILINOGEN 1.0 11/28/2013 0143   NITRITE NEGATIVE 05/19/2016 1138   LEUKOCYTESUR NEGATIVE 05/19/2016 1138     STUDIES: No results found.  ELIGIBLE FOR AVAILABLE RESEARCH PROTOCOL: no   ASSESSMENT: 82 y.o. [place] woman status post right breast lower outer quadrant and right axillary lymph node biopsy 01/06/2016 both positive for a clinical T4 N1 M1, stage IV invasive ductal carcinoma, grade 3, triple negative, with an MIB-1 of 70%  (a) CT scan of the chest and bone scan 01/27/2016 showed scattered pulmonary nodules in addition to locally advanced disease  (b)  repeat CT scan of the chest 04/12/2016 shows persistent pulmonary nodules, progression in local disease  (1) neoadjuvant chemotherapy consisting of capecitabine, 1500 mg twice daily, 7 days on 7 days off, starting 01/27/2016, discontinued 04/14/2016 with progression  (2) right modified radical mastectomy 05/25/2016 showed a pT3 pN2, stage IIIA invasive ductal carcinoma, grade 3, with negative margins.  (3) adjuvant radiation to follow surgery  PLAN: Addelynn tolerated her surgery well and luckily we were able to obtain clear margins.  Nevertheless she remains at very high risk for local recurrence. I discussed this with the patient and her daughter and they are agreeable to adjuvant radiation, which is appropriate in this case.  I expect radiation will finish sometime late January. I'm going to see the patient early March and before that visit she will have a PET scan. This will allow [place] to get a better idea whether the lung nodules are "hot" and active, as well as of course to remeasure them and to look for bony disease or other areas of disease. We can then start long term observation, planning chemotherapy if the metastatic disease becomes symptomatic  The patient has a good understanding of this plan. She and her daughter agree. They know the goal of treatment in her case is control. They will call with any problems that may develop before her next visit here.  Chauncey Cruel, MD   06/23/2016 8:44 AM Medical Oncology and Hematology [facility] [address] [place], [place] [address] Tel. [phone]    Fax. [phone]

## 2016-06-23 NOTE — Therapy (Signed)
[place], [place], [address] Phone: [phone]   Fax:  [phone]  Physical Therapy Treatment  Patient Details  Name: Jennifer Garrett MRN: [id number] Date of Birth: [birth year] Referring Provider: Dr. Fanny Skates   Encounter Date: 06/23/2016      PT End of Session - 06/23/16 1240    Visit Number 3   Number of Visits 9   Date for PT Re-Evaluation 07/15/16   PT Start Time 1021   PT Stop Time 1103   PT Time Calculation (min) 42 min   Activity Tolerance Patient tolerated treatment well   Behavior During Therapy [facility] for tasks assessed/performed      Past Medical History:  Diagnosis Date  . Arthritis   . Asthma    rarely uses neb  . Breast cancer of lower-inner quadrant of right female breast ([place]) 01/22/2016  . Carpal tunnel syndrome   . Coronary artery disease    DES to OM3 90% stenosis 2003, 90% small RCA stenosis.    . Diabetes mellitus   . History of stomach ulcers   . Hyperlipemia   . Hypertension   . Irregular heart beat     Past Surgical History:  Procedure Laterality Date  . ABDOMINAL HYSTERECTOMY    . CARDIAC CATHETERIZATION  2003   stent-  . CARPAL TUNNEL RELEASE  03/29/2012   Procedure: CARPAL TUNNEL RELEASE;  Surgeon: Wynonia Sours, MD;  Location: [facility];  Service: Orthopedics;  Laterality: Left;  . EYE SURGERY     catracts  . LESION EXCISION WITH COMPLEX REPAIR Right 05/25/2016   Procedure: complex repair of 25cm wound;  Surgeon: Irene Limbo, MD;  Location: [facility];  Service: Plastics;  Laterality: Right;  . MASTECTOMY MODIFIED RADICAL Right 05/25/2016   Procedure: RIGHT MASTECTOMY MODIFIED RADICAL;  Surgeon: Fanny Skates, MD;  Location: [facility];  Service: General;  Laterality: Right;  . SHOULDER ARTHROSCOPY     right and left  . TRIGGER FINGER RELEASE  03/29/2012   Procedure: RELEASE TRIGGER FINGER/A-1 PULLEY;  Surgeon: Wynonia Sours, MD;  Location: [facility];  Service: Orthopedics;  Laterality: Left;    There were no vitals filed for this visit.      Subjective Assessment - 06/23/16 1023    Subjective Feel like I'm walking taller/better. Also been doing my HEP and my arm motion is getting better too.    Patient is accompained by: Family member  daughter Jennifer Garrett   Pertinent History Hx includes memory problems and multiple medical problem including hip pain.  Breast cancer found in June 2017, did not respond to chemotherapy.  Extended right radical mastectomy with cmplex wound closure on Nov. 7, 2017 with 6 out of 21 nodes positive.    Patient Stated Goals daughter wants pt to get stronger and walk better and to get right arm stronger.   Currently in Pain? No/denies            [facility] PT Assessment - 06/23/16 0001      AROM   Right Shoulder Flexion 128 Degrees   Right Shoulder ABduction 116 Degrees                     [facility] Adult PT Treatment/Exercise - 06/23/16 0001      Knee/Hip Exercises: Stretches   Active Hamstring Stretch Both;3 reps;20 seconds  Seated edge of chair   Active Hamstring Stretch Limitations VC and demonstration to hold  stretch 20" and keep LE extended for duration of stretch   Piriformis Stretch Right;2 reps;20 seconds  Seated edge of chair in figure 4 position     Knee/Hip Exercises: Aerobic   Nustep Level 3 x 6 minutes     Knee/Hip Exercises: Standing   Hip Flexion Stengthening;Both;10 reps;Knee bent  At counter for +1 HHA alternate marching   Hip Abduction Stengthening;Both;10 reps;Knee straight  At counter for +2 HHA   Abduction Limitations VC to decrease hip er   Hip Extension Stengthening;Both;10 reps;Knee straight  At counter for +2 HHA   Extension Limitations VC to decrease forward trunk lean     Manual Therapy   Myofascial Release Rt UE pulling during P/ROM   Passive ROM In Supine to Rt shoulder into flexion, abduction and er to pts tolerance with VC throughout  to relax.                PT Education - 06/23/16 1232    Education provided Yes   Education Details Bil hip 3 way SLR at counter and seated hamstring and piriformis stretches (printed piriformis stretch from [employer] website for pt)   Person(s) Educated Patient;Child(ren)  daughter, Jennifer Garrett   Methods Explanation;Demonstration;Handout   Comprehension Verbalized understanding;Returned demonstration;Need further instruction                [facility] Goals - 06/23/16 1245      CC Long Term Goal  #1   Title Patient / daughter will verbalize an understanding of lymphedema risk reduction precautions   Baseline They both attended ABC class 06/21/16 and have no firther questions reporting this was very educational and informative-06/23/16   Status Achieved     CC Long Term Goal  #2   Title Patient will be independent in a  home exercise program for right shoulder range of motion and strength, LEstrength and general strength.   Status On-going     CC Long Term Goal  #3   Title Pt will decrease Quick DASH score to 25 indicating an improvment in Right UE function   Status On-going     CC Long Term Goal  #4   Title Pt will increase right shoulder abduction to 125 so that she will be able to receive radiation treatment if needed.    Baseline 90 degrees; 116 degrees 06/23/16   Status On-going            Plan - 06/23/16 1241    Clinical Impression Statement Ms. Incorvaia did very well with adding LE exercises to her HEP today and reports her and her daughter have been noticing her ambulating better with more erect posture and her Rt shoulder ROM is improving. Her A/ROM measurements have improved since last measured as well. Pt is showing good progress towards goals at this time.    Rehab Potential Good   Clinical Impairments Affecting Rehab Potential 21 lymph nodes removed, dereased memory    PT Frequency 2x / week   PT Duration 4 weeks   PT Treatment/Interventions  ADLs/Self Care Home Management;Patient/family education;Orthotic Fit/Training;DME Instruction;Manual techniques;Therapeutic activities;Manual lymph drainage;Therapeutic exercise;Scar mobilization;Passive range of motion   PT Next Visit Plan A/AA/P/ROM to right shoulder and manual techniques as indicated. Cont Nustep; Cont hip strengthening exercise and review LE HEP, Explore community excercise options at [place] see education section   Consulted and Agree with Plan of Care Patient;Family member/caregiver   Family Member Consulted Jennifer Garrett  Patient will benefit from skilled therapeutic intervention in order to improve the following deficits and impairments:  Decreased endurance, Abnormal gait, Increased edema, Decreased knowledge of precautions, Decreased activity tolerance, Decreased knowledge of use of DME, Impaired UE functional use, Decreased strength, Difficulty walking, Decreased cognition, Decreased range of motion, Postural dysfunction  Visit Diagnosis: Stiffness of right shoulder, not elsewhere classified  Abnormal posture  Muscle weakness (generalized)  Localized swelling, mass and lump, trunk     Problem List Patient Active Problem List   Diagnosis Date Noted  . Lung metastases ([place]) 06/23/2016  . Cancer of overlapping sites of right female breast ([place]) 05/25/2016  . Breast cancer of lower-inner quadrant of right female breast ([place]) 01/22/2016    Otelia Limes, PTA 06/23/2016, 12:47 PM  [place], [place], [address] Phone: [phone]   Fax:  [phone]  Name: KEYIA MORETTO MRN: [id number] Date of Birth: [birth year]

## 2016-06-23 NOTE — Patient Instructions (Signed)
HIP / KNEE: Flexion - Standing    Raise knee to chest. Keep back straight. Perform slowly. _10-20__ reps per set, _2-3__ sets per day. Hold onto a support.  Copyright  VHI. All rights reserved.   EXTENSION: Standing (Active)    Stand, both feet flat. Draw right leg behind body as far as possible.  Complete _10-20__ repetitions. Perform _2-3__ sessions per day. Hold counter top.  http://gtsc.exer.us/77   Copyright  VHI. All rights reserved.   ABDUCTION: Standing (Active)    Stand, feet flat. Lift right leg out to side.  Complete  _10-20__ repetitions. Perform _2-3__ sessions per day.  http://gtsc.exer.us/111   Copyright  VHI. All rights reserved.   Hamstring Stretch    Inhale and straighten spine. Exhale and lean forward toward extended leg. Hold position for _20__ seconds. Inhale and come back to center. Repeat with other leg extended. Repeat _3__ times, alternating legs. Do _2-3__ times per day, especially after walking and leg exercises.  Copyright  VHI. All rights reserved.

## 2016-06-25 ENCOUNTER — Ambulatory Visit: Payer: Medicare Other | Admitting: Physical Therapy

## 2016-06-25 DIAGNOSIS — R293 Abnormal posture: Secondary | ICD-10-CM

## 2016-06-25 DIAGNOSIS — M6281 Muscle weakness (generalized): Secondary | ICD-10-CM

## 2016-06-25 DIAGNOSIS — M25611 Stiffness of right shoulder, not elsewhere classified: Secondary | ICD-10-CM

## 2016-06-25 DIAGNOSIS — R222 Localized swelling, mass and lump, trunk: Secondary | ICD-10-CM

## 2016-06-25 NOTE — Therapy (Signed)
[place], [place], [address] Phone: [phone]   Fax:  [phone]  Physical Therapy Treatment  Patient Details  Name: Jennifer Garrett MRN: [id number] Date of Birth: [birth year] Referring Provider: Dr. Fanny Skates   Encounter Date: 06/25/2016      PT End of Session - 06/25/16 1030    PT Start Time 0930   PT Stop Time 1025   PT Time Calculation (min) 55 min      Past Medical History:  Diagnosis Date  . Arthritis   . Asthma    rarely uses neb  . Breast cancer of lower-inner quadrant of right female breast ([place]) 01/22/2016  . Carpal tunnel syndrome   . Coronary artery disease    DES to OM3 90% stenosis 2003, 90% small RCA stenosis.    . Diabetes mellitus   . History of stomach ulcers   . Hyperlipemia   . Hypertension   . Irregular heart beat     Past Surgical History:  Procedure Laterality Date  . ABDOMINAL HYSTERECTOMY    . CARDIAC CATHETERIZATION  2003   stent-  . CARPAL TUNNEL RELEASE  03/29/2012   Procedure: CARPAL TUNNEL RELEASE;  Surgeon: Wynonia Sours, MD;  Location: [place];  Service: Orthopedics;  Laterality: Left;  . EYE SURGERY     catracts  . LESION EXCISION WITH COMPLEX REPAIR Right 05/25/2016   Procedure: complex repair of 25cm wound;  Surgeon: Irene Limbo, MD;  Location: [place];  Service: Plastics;  Laterality: Right;  . MASTECTOMY MODIFIED RADICAL Right 05/25/2016   Procedure: RIGHT MASTECTOMY MODIFIED RADICAL;  Surgeon: Fanny Skates, MD;  Location: [facility];  Service: General;  Laterality: Right;  . SHOULDER ARTHROSCOPY     right and left  . TRIGGER FINGER RELEASE  03/29/2012   Procedure: RELEASE TRIGGER FINGER/A-1 PULLEY;  Surgeon: Wynonia Sours, MD;  Location: [facility];  Service: Orthopedics;  Laterality: Left;    There were no vitals filed for this visit.      Subjective Assessment - 06/25/16 0943    Subjective feels fine  Pt  states she has been doing exercise.    Pertinent History Hx includes memory problems and multiple medical problem including hip pain.  Breast cancer found in June 2017, did not respond to chemotherapy.  Extended right radical mastectomy with cmplex wound closure on Nov. 7, 2017 with 6 out of 21 nodes positive.    Patient Stated Goals daughter wants pt to get stronger and walk better and to get right arm stronger.   Currently in Pain? No/denies                         [facility] Adult PT Treatment/Exercise - 06/25/16 0001      High Level Balance   High Level Balance Activities Side stepping;Backward walking;Direction changes;Sudden stops;Marching forwards;Other (comment)  with finger along wall for balance    High Level Balance Comments also changes in velocity      Knee/Hip Exercises: Aerobic   Nustep Level 3 x 49minutes     Knee/Hip Exercises: Standing   Knee Flexion AROM;Both;2 sets;5 reps  pt c/o pain in right hip      Shoulder Exercises: Pulleys   Flexion 2 minutes   ABduction 2 minutes     Shoulder Exercises: ROM/Strengthening   Wall Wash flexion and abduction 5 reps each, both arms up the wall with towel with weight  shift form split stance    Other ROM/Strengthening Exercises perch sitting with dowel rod both arms overhead     Moist Heat Therapy   Number Minutes Moist Heat 10 Minutes   Moist Heat Location Hip  right                         [facility] Goals - 06/23/16 1245      CC Long Term Goal  #1   Title Patient / daughter will verbalize an understanding of lymphedema risk reduction precautions   Baseline They both attended ABC class 06/21/16 and have no firther questions reporting this was very educational and informative-06/23/16   Status Achieved     CC Long Term Goal  #2   Title Patient will be independent in a  home exercise program for right shoulder range of motion and strength, LEstrength and general strength.   Status  On-going     CC Long Term Goal  #3   Title Pt will decrease Quick DASH score to 25 indicating an improvment in Right UE function   Status On-going     CC Long Term Goal  #4   Title Pt will increase right shoulder abduction to 125 so that she will be able to receive radiation treatment if needed.    Baseline 90 degrees; 116 degrees 06/23/16   Status On-going            Plan - 06/25/16 1027    Clinical Impression Statement Pt appears to be doing better overall with good participation with exercise and gait activities.  Talked to daughter about community PACE program and she will investigate to see if this is something they are interested in    Rehab Potential Good   Clinical Impairments Affecting Rehab Potential 21 lymph nodes removed, dereased memory    PT Next Visit Plan A/AA/P/ROM to right shoulder and manual techniques as indicated. Cont Nustep; Cont hip strengthening exercise and review LE HEP, Explore community excercise options at [place]   Consulted and Agree with Plan of Care Patient;Family member/caregiver   Family Member Consulted Margaret      Patient will benefit from skilled therapeutic intervention in order to improve the following deficits and impairments:  Decreased endurance, Abnormal gait, Increased edema, Decreased knowledge of precautions, Decreased activity tolerance, Decreased knowledge of use of DME, Impaired UE functional use, Decreased strength, Difficulty walking, Decreased cognition, Decreased range of motion, Postural dysfunction  Visit Diagnosis: Stiffness of right shoulder, not elsewhere classified  Abnormal posture  Muscle weakness (generalized)  Localized swelling, mass and lump, trunk     Problem List Patient Active Problem List   Diagnosis Date Noted  . Lung metastases ([place]) 06/23/2016  . Cancer of overlapping sites of right female breast ([place]) 05/25/2016  . Breast cancer of lower-inner quadrant of right female breast ([place]) 01/22/2016    Donato Heinz. Owens Shark PT  Norwood Levo 06/25/2016, 10:31 AM  [place], [place], [address] Phone: [phone]   Fax:  [phone]  Name: Jennifer Garrett MRN: [id number] Date of Birth: Nov 11, 1931

## 2016-06-28 ENCOUNTER — Ambulatory Visit: Payer: Medicare Other

## 2016-06-28 DIAGNOSIS — M25611 Stiffness of right shoulder, not elsewhere classified: Secondary | ICD-10-CM | POA: Diagnosis not present

## 2016-06-28 DIAGNOSIS — R222 Localized swelling, mass and lump, trunk: Secondary | ICD-10-CM

## 2016-06-28 DIAGNOSIS — R293 Abnormal posture: Secondary | ICD-10-CM

## 2016-06-28 DIAGNOSIS — M6281 Muscle weakness (generalized): Secondary | ICD-10-CM

## 2016-06-28 NOTE — Therapy (Signed)
[place], [place], [address] Phone: [phone]   Fax:  [phone]  Physical Therapy Treatment  Patient Details  Name: LAJOY VANAMBURG MRN: [id number] Date of Birth: 1932/04/18 Referring Provider: Dr. Fanny Skates   Encounter Date: 06/28/2016      PT End of Session - 06/28/16 1046    Visit Number 5   Number of Visits 9   Date for PT Re-Evaluation 07/15/16   PT Start Time 6387   PT Stop Time 1104   PT Time Calculation (min) 50 min   Activity Tolerance Patient tolerated treatment well   Behavior During Therapy [facility] for tasks assessed/performed      Past Medical History:  Diagnosis Date  . Arthritis   . Asthma    rarely uses neb  . Breast cancer of lower-inner quadrant of right female breast ([place]) 01/22/2016  . Carpal tunnel syndrome   . Coronary artery disease    DES to OM3 90% stenosis 2003, 90% small RCA stenosis.    . Diabetes mellitus   . History of stomach ulcers   . Hyperlipemia   . Hypertension   . Irregular heart beat     Past Surgical History:  Procedure Laterality Date  . ABDOMINAL HYSTERECTOMY    . CARDIAC CATHETERIZATION  2003   stent-  . CARPAL TUNNEL RELEASE  03/29/2012   Procedure: CARPAL TUNNEL RELEASE;  Surgeon: Wynonia Sours, MD;  Location: [place];  Service: Orthopedics;  Laterality: Left;  . EYE SURGERY     catracts  . LESION EXCISION WITH COMPLEX REPAIR Right 05/25/2016   Procedure: complex repair of 25cm wound;  Surgeon: Irene Limbo, MD;  Location: [place];  Service: Plastics;  Laterality: Right;  . MASTECTOMY MODIFIED RADICAL Right 05/25/2016   Procedure: RIGHT MASTECTOMY MODIFIED RADICAL;  Surgeon: Fanny Skates, MD;  Location: [facility];  Service: General;  Laterality: Right;  . SHOULDER ARTHROSCOPY     right and left  . TRIGGER FINGER RELEASE  03/29/2012   Procedure: RELEASE TRIGGER FINGER/A-1 PULLEY;  Surgeon: Wynonia Sours, MD;  Location: [place];  Service: Orthopedics;  Laterality: Left;    There were no vitals filed for this visit.      Subjective Assessment - 06/28/16 1017    Subjective Feel like I can do what I need to now with my Rt arm. I've been using it more around the house. And the stretches you gave me for my Rt hip really helped my pain I was having last week, I'm not limping anymore.    Pertinent History Hx includes memory problems and multiple medical problem including hip pain.  Breast cancer found in June 2017, did not respond to chemotherapy.  Extended right radical mastectomy with cmplex wound closure on Nov. 7, 2017 with 6 out of 21 nodes positive.    Patient Stated Goals daughter wants pt to get stronger and walk better and to get right arm stronger.   Currently in Pain? No/denies            [facility] PT Assessment - 06/28/16 0001      AROM   Right Shoulder Flexion 135 Degrees   Right Shoulder ABduction 128 Degrees                     [facility] Adult PT Treatment/Exercise - 06/28/16 0001      Knee/Hip Exercises: Stretches   Active Hamstring Stretch Right;2 reps;20 seconds  Seated edge of chair   Piriformis Stretch Right;2 reps;20 seconds  Seated in chair in figure 4 position     Knee/Hip Exercises: Aerobic   Nustep Level 4 x 7 minutes     Knee/Hip Exercises: Standing   Hip Flexion Stengthening;Both;20 reps;Knee straight  2 lbs each ankle in // bars   Hip Abduction Stengthening;Both;15 reps  2 lbs each ankle in // bars   Hip Extension Stengthening;Both;15 reps;Knee straight  2 lbs each ankle in // bars   Extension Limitations VC throught and demonstrationn for correct technique     Manual Therapy   Myofascial Release Rt UE pulling during P/ROM   Passive ROM In Supine to Rt shoulder into flexion, abduction and er to pts tolerance with VC throughout to relax but she did do better with this than when this therapist saw her last.                         [facility] Goals - 06/23/16 1245      CC Long Term Goal  #1   Title Patient / daughter will verbalize an understanding of lymphedema risk reduction precautions   Baseline They both attended ABC class 06/21/16 and have no firther questions reporting this was very educational and informative-06/23/16   Status Achieved     CC Long Term Goal  #2   Title Patient will be independent in a  home exercise program for right shoulder range of motion and strength, LEstrength and general strength.   Status On-going     CC Long Term Goal  #3   Title Pt will decrease Quick DASH score to 25 indicating an improvment in Right UE function   Status On-going     CC Long Term Goal  #4   Title Pt will increase right shoulder abduction to 125 so that she will be able to receive radiation treatment if needed.    Baseline 90 degrees; 116 degrees 06/23/16   Status On-going            Plan - 06/28/16 1048    Clinical Impression Statement Pt continues to tolerate therapy very well and is compliant with HEP. Her A/ROM has improved since last time measured and she reports using her Rt UE more and without limitations now.    Rehab Potential Good   Clinical Impairments Affecting Rehab Potential 21 lymph nodes removed, dereased memory    PT Frequency 2x / week   PT Duration 4 weeks   PT Treatment/Interventions ADLs/Self Care Home Management;Patient/family education;Orthotic Fit/Training;DME Instruction;Manual techniques;Therapeutic activities;Manual lymph drainage;Therapeutic exercise;Scar mobilization;Passive range of motion   PT Next Visit Plan A/AA/P/ROM to right shoulder and manual techniques as indicated. Cont Nustep; Cont hip strengthening exercise and review LE HEP, Explore community excercise options at [place]   Consulted and Agree with Plan of Care Patient      Patient will benefit from skilled therapeutic intervention in order to improve the following deficits and impairments:  Decreased endurance,  Abnormal gait, Increased edema, Decreased knowledge of precautions, Decreased activity tolerance, Decreased knowledge of use of DME, Impaired UE functional use, Decreased strength, Difficulty walking, Decreased cognition, Decreased range of motion, Postural dysfunction  Visit Diagnosis: Stiffness of right shoulder, not elsewhere classified  Abnormal posture  Muscle weakness (generalized)  Localized swelling, mass and lump, trunk     Problem List Patient Active Problem List   Diagnosis Date Noted  . Lung metastases ([place]) 06/23/2016  . Cancer of  overlapping sites of right female breast ([place]) 05/25/2016  . Breast cancer of lower-inner quadrant of right female breast ([place]) 01/22/2016    Otelia Limes, PTA 06/28/2016, 11:11 AM  [place], [place], [address] Phone: [phone]   Fax:  [phone]  Name: ANN BOHNE MRN: [id number] Date of Birth: 1932/02/08

## 2016-06-30 ENCOUNTER — Ambulatory Visit: Payer: Medicare Other

## 2016-06-30 DIAGNOSIS — M25611 Stiffness of right shoulder, not elsewhere classified: Secondary | ICD-10-CM | POA: Diagnosis not present

## 2016-06-30 DIAGNOSIS — R222 Localized swelling, mass and lump, trunk: Secondary | ICD-10-CM

## 2016-06-30 DIAGNOSIS — R293 Abnormal posture: Secondary | ICD-10-CM

## 2016-06-30 DIAGNOSIS — M6281 Muscle weakness (generalized): Secondary | ICD-10-CM

## 2016-06-30 NOTE — Therapy (Signed)
[place], [place], [address] Phone: [phone]   Fax:  [phone]  Physical Therapy Treatment  Patient Details  Name: Jennifer Garrett MRN: [id number] Date of Birth: 1932/04/15 Referring Provider: Dr. Fanny Skates   Encounter Date: 06/30/2016      PT End of Session - 06/30/16 1221    Visit Number 6   Number of Visits 9   Date for PT Re-Evaluation 07/15/16   PT Start Time 1114  Pt arrived late   PT Stop Time 1155   PT Time Calculation (min) 41 min   Activity Tolerance Patient tolerated treatment well   Behavior During Therapy [facility] for tasks assessed/performed      Past Medical History:  Diagnosis Date  . Arthritis   . Asthma    rarely uses neb  . Breast cancer of lower-inner quadrant of right female breast ([place]) 01/22/2016  . Carpal tunnel syndrome   . Coronary artery disease    DES to OM3 90% stenosis 2003, 90% small RCA stenosis.    . Diabetes mellitus   . History of stomach ulcers   . Hyperlipemia   . Hypertension   . Irregular heart beat     Past Surgical History:  Procedure Laterality Date  . ABDOMINAL HYSTERECTOMY    . CARDIAC CATHETERIZATION  2003   stent-  . CARPAL TUNNEL RELEASE  03/29/2012   Procedure: CARPAL TUNNEL RELEASE;  Surgeon: Wynonia Sours, MD;  Location: [place];  Service: Orthopedics;  Laterality: Left;  . EYE SURGERY     catracts  . LESION EXCISION WITH COMPLEX REPAIR Right 05/25/2016   Procedure: complex repair of 25cm wound;  Surgeon: Irene Limbo, MD;  Location: [place];  Service: Plastics;  Laterality: Right;  . MASTECTOMY MODIFIED RADICAL Right 05/25/2016   Procedure: RIGHT MASTECTOMY MODIFIED RADICAL;  Surgeon: Fanny Skates, MD;  Location: [place];  Service: General;  Laterality: Right;  . SHOULDER ARTHROSCOPY     right and left  . TRIGGER FINGER RELEASE  03/29/2012   Procedure: RELEASE TRIGGER FINGER/A-1 PULLEY;  Surgeon: Wynonia Sours,  MD;  Location: [place];  Service: Orthopedics;  Laterality: Left;    There were no vitals filed for this visit.      Subjective Assessment - 06/30/16 1117    Subjective No complaints, doing well. I've probably been doing too much because I'm tired!   Pertinent History Hx includes memory problems and multiple medical problem including hip pain.  Breast cancer found in June 2017, did not respond to chemotherapy.  Extended right radical mastectomy with cmplex wound closure on Nov. 7, 2017 with 6 out of 21 nodes positive.    Patient Stated Goals daughter wants pt to get stronger and walk better and to get right arm stronger.   Currently in Pain? No/denies                         [facility] Adult PT Treatment/Exercise - 06/30/16 0001      Knee/Hip Exercises: Aerobic   Nustep Level 4 x 7 minutes     Knee/Hip Exercises: Standing   Hip Flexion Stengthening;Both;10 reps;Knee bent  Alt march with 2 lbs each ankle   Hip Flexion Limitations +1 HHA at counter   Hip Abduction Stengthening;Both;15 reps  2 lbs each ankle   Abduction Limitations +1 HHA at counter   Hip Extension Stengthening;Both;10 reps  2 lbs each ankle  Extension Limitations Tactile and VC throughout for full extension   Wall Squat 10 reps;3 seconds   Wall Squat Limitations Tactile cuing to keep hips on wall   Other Standing Knee Exercises At counter: Heel toe walking 10 ft x2; crossover in front walking to Rt and then Lt 10 ft 1 time each     Manual Therapy   Myofascial Release Rt UE pulling during P/ROM   Passive ROM In Supine to Rt shoulder into flexion, abduction and er to pts tolerance with VC throughout to relax but she did do better with this than when this therapist saw her last.                        [facility] Goals - 06/23/16 1245      CC Long Term Goal  #1   Title Patient / daughter will verbalize an understanding of lymphedema risk reduction precautions    Baseline They both attended ABC class 06/21/16 and have no firther questions reporting this was very educational and informative-06/23/16   Status Achieved     CC Long Term Goal  #2   Title Patient will be independent in a  home exercise program for right shoulder range of motion and strength, LEstrength and general strength.   Status On-going     CC Long Term Goal  #3   Title Pt will decrease Quick DASH score to 25 indicating an improvment in Right UE function   Status On-going     CC Long Term Goal  #4   Title Pt will increase right shoulder abduction to 125 so that she will be able to receive radiation treatment if needed.    Baseline 90 degrees; 116 degrees 06/23/16   Status On-going            Plan - 06/30/16 1222    Clinical Impression Statement Pt continues to do well with therapy and is progressing well. Added ankle weights today and wall squats and she tolerated these well. Also issued handouts from internet about PACE and free classes offered at the [facility] for pt and her daughter to review and decide if she would like to pursue any of these.    Rehab Potential Good   Clinical Impairments Affecting Rehab Potential 21 lymph nodes removed, dereased memory    PT Frequency 2x / week   PT Duration 4 weeks   PT Treatment/Interventions ADLs/Self Care Home Management;Patient/family education;Orthotic Fit/Training;DME Instruction;Manual techniques;Therapeutic activities;Manual lymph drainage;Therapeutic exercise;Scar mobilization;Passive range of motion   PT Next Visit Plan Cont A/AA/P/ROM to right shoulder and manual techniques as indicated. Cont Nustep; Cont hip strengthening exercise/add step ups and review LE HEP. See if pt/daughter has any questions regarding community information issued today.    Recommended Other Services Community exercise programs at possibly PACE or [employer], issued handouts printed from internet today   Consulted and Agree with Plan of  Care Patient      Patient will benefit from skilled therapeutic intervention in order to improve the following deficits and impairments:  Decreased endurance, Abnormal gait, Increased edema, Decreased knowledge of precautions, Decreased activity tolerance, Decreased knowledge of use of DME, Impaired UE functional use, Decreased strength, Difficulty walking, Decreased cognition, Decreased range of motion, Postural dysfunction  Visit Diagnosis: Stiffness of right shoulder, not elsewhere classified  Abnormal posture  Muscle weakness (generalized)  Localized swelling, mass and lump, trunk     Problem List Patient Active Problem List  Diagnosis Date Noted  . Lung metastases ([place]) 06/23/2016  . Cancer of overlapping sites of right female breast ([place]) 05/25/2016  . Breast cancer of lower-inner quadrant of right female breast ([place]) 01/22/2016    Otelia Limes, PTA 06/30/2016, 12:28 PM  [place], [place], [address] Phone: [phone]   Fax:  [phone]  Name: Jennifer Garrett MRN: [id number] Date of Birth: Aug 03, 1931

## 2016-07-05 ENCOUNTER — Ambulatory Visit: Payer: Medicare Other

## 2016-07-05 DIAGNOSIS — M25611 Stiffness of right shoulder, not elsewhere classified: Secondary | ICD-10-CM | POA: Diagnosis not present

## 2016-07-05 DIAGNOSIS — M6281 Muscle weakness (generalized): Secondary | ICD-10-CM

## 2016-07-05 DIAGNOSIS — R293 Abnormal posture: Secondary | ICD-10-CM

## 2016-07-05 NOTE — Therapy (Signed)
[place], [place], [address] Phone: [phone]   Fax:  [phone]  Physical Therapy Treatment  Patient Details  Name: Jennifer Garrett MRN: [id number] Date of Birth: 09-Mar-1932 Referring Provider: Dr. Fanny Skates   Encounter Date: 07/05/2016      PT End of Session - 07/05/16 1205    Visit Number 7   Number of Visits 9   Date for PT Re-Evaluation 07/15/16   PT Start Time 1108   PT Stop Time 1202   PT Time Calculation (min) 54 min   Activity Tolerance Patient tolerated treatment well   Behavior During Therapy [facility] for tasks assessed/performed      Past Medical History:  Diagnosis Date  . Arthritis   . Asthma    rarely uses neb  . Breast cancer of lower-inner quadrant of right female breast ([place]) 01/22/2016  . Carpal tunnel syndrome   . Coronary artery disease    DES to OM3 90% stenosis 2003, 90% small RCA stenosis.    . Diabetes mellitus   . History of stomach ulcers   . Hyperlipemia   . Hypertension   . Irregular heart beat     Past Surgical History:  Procedure Laterality Date  . ABDOMINAL HYSTERECTOMY    . CARDIAC CATHETERIZATION  2003   stent-  . CARPAL TUNNEL RELEASE  03/29/2012   Procedure: CARPAL TUNNEL RELEASE;  Surgeon: Wynonia Sours, MD;  Location: [place];  Service: Orthopedics;  Laterality: Left;  . EYE SURGERY     catracts  . LESION EXCISION WITH COMPLEX REPAIR Right 05/25/2016   Procedure: complex repair of 25cm wound;  Surgeon: Irene Limbo, MD;  Location: [place];  Service: Plastics;  Laterality: Right;  . MASTECTOMY MODIFIED RADICAL Right 05/25/2016   Procedure: RIGHT MASTECTOMY MODIFIED RADICAL;  Surgeon: Fanny Skates, MD;  Location: [place];  Service: General;  Laterality: Right;  . SHOULDER ARTHROSCOPY     right and left  . TRIGGER FINGER RELEASE  03/29/2012   Procedure: RELEASE TRIGGER FINGER/A-1 PULLEY;  Surgeon: Wynonia Sours, MD;  Location: [place];  Service: Orthopedics;  Laterality: Left;    There were no vitals filed for this visit.      Subjective Assessment - 07/05/16 1116    Subjective I feel like I am doing really well. I was cleaning the stove and scrubbing it pretty good on Thursday and it hurt on Friday but then it has felt so much better since then.     Pertinent History Hx includes memory problems and multiple medical problem including hip pain.  Breast cancer found in June 2017, did not respond to chemotherapy.  Extended right radical mastectomy with cmplex wound closure on Nov. 7, 2017 with 6 out of 21 nodes positive.    Patient Stated Goals daughter wants pt to get stronger and walk better and to get right arm stronger.   Currently in Pain? No/denies                         [facility] Adult PT Treatment/Exercise - 07/05/16 0001      Knee/Hip Exercises: Stretches   Active Hamstring Stretch Right;3 reps;20 seconds  Seated edge of chair   Piriformis Stretch Right;3 reps;20 seconds  Seated edge of chair in figure 4 position     Knee/Hip Exercises: Aerobic   Nustep Level 6 x 7 minutes     Knee/Hip Exercises:  Seated   Long Arc Quad Strengthening;Both;10 reps;Weights  5 second holds   Long [employer] Weight 2 lbs.     Manual Therapy   Myofascial Release Rt UE pulling during P/ROM   Passive ROM In Supine to Rt shoulder into flexion, abduction and er to pts tolerance with VC throughout to relax but she did do better with this than when this therapist saw her last.                        [facility] Goals - 06/23/16 1245      CC Long Term Goal  #1   Title Patient / daughter will verbalize an understanding of lymphedema risk reduction precautions   Baseline They both attended ABC class 06/21/16 and have no firther questions reporting this was very educational and informative-06/23/16   Status Achieved     CC Long Term Goal  #2   Title Patient will be independent in  a  home exercise program for right shoulder range of motion and strength, LEstrength and general strength.   Status On-going     CC Long Term Goal  #3   Title Pt will decrease Quick DASH score to 25 indicating an improvment in Right UE function   Status On-going     CC Long Term Goal  #4   Title Pt will increase right shoulder abduction to 125 so that she will be able to receive radiation treatment if needed.    Baseline 90 degrees; 116 degrees 06/23/16   Status On-going            Plan - 07/05/16 1213    Clinical Impression Statement Pt has progressed very well with therapy and progressed well towards her goals. She reports feels ready for D/C at next visit.    Rehab Potential Good   Clinical Impairments Affecting Rehab Potential 21 lymph nodes removed, dereased memory    PT Frequency 2x / week   PT Duration 4 weeks   PT Treatment/Interventions ADLs/Self Care Home Management;Patient/family education;Orthotic Fit/Training;DME Instruction;Manual techniques;Therapeutic activities;Manual lymph drainage;Therapeutic exercise;Scar mobilization;Passive range of motion   PT Next Visit Plan D/C next visit. Have pt retake Quick DASH and reassess goals. Assess independence with HEP.   Consulted and Agree with Plan of Care Patient   Family Member [place]      Patient will benefit from skilled therapeutic intervention in order to improve the following deficits and impairments:  Decreased endurance, Abnormal gait, Increased edema, Decreased knowledge of precautions, Decreased activity tolerance, Decreased knowledge of use of DME, Impaired UE functional use, Decreased strength, Difficulty walking, Decreased cognition, Decreased range of motion, Postural dysfunction  Visit Diagnosis: Abnormal posture  Stiffness of right shoulder, not elsewhere classified  Muscle weakness (generalized)     Problem List Patient Active Problem List   Diagnosis Date Noted  . Lung metastases ([place])  06/23/2016  . Cancer of overlapping sites of right female breast ([place]) 05/25/2016  . Breast cancer of lower-inner quadrant of right female breast ([place]) 01/22/2016    Otelia Limes, PTA 07/05/2016, 12:16 PM  [place], [place], [address] Phone: [phone]   Fax:  [phone]  Name: Jennifer Garrett MRN: [id number] Date of Birth: [birth year]

## 2016-07-07 ENCOUNTER — Ambulatory Visit: Payer: Medicare Other | Admitting: Physical Therapy

## 2016-07-07 DIAGNOSIS — M25611 Stiffness of right shoulder, not elsewhere classified: Secondary | ICD-10-CM

## 2016-07-07 DIAGNOSIS — M6281 Muscle weakness (generalized): Secondary | ICD-10-CM

## 2016-07-07 DIAGNOSIS — R222 Localized swelling, mass and lump, trunk: Secondary | ICD-10-CM

## 2016-07-07 DIAGNOSIS — R293 Abnormal posture: Secondary | ICD-10-CM

## 2016-07-07 NOTE — Therapy (Signed)
[place], [place], [address] Phone: [phone]   Fax:  [phone]  Physical Therapy Treatment  Patient Details  Name: Jennifer Garrett MRN: [id number] Date of Birth: Apr 20, 1932 Referring Provider: Dr. Fanny Skates   Encounter Date: 07/07/2016      PT End of Session - 07/07/16 1144    Visit Number 8   Number of Visits 9   Date for PT Re-Evaluation 07/15/16   PT Start Time 1106   PT Stop Time 1144   PT Time Calculation (min) 38 min   Activity Tolerance Patient tolerated treatment well   Behavior During Therapy [facility] for tasks assessed/performed      Past Medical History:  Diagnosis Date  . Arthritis   . Asthma    rarely uses neb  . Breast cancer of lower-inner quadrant of right female breast ([place]) 01/22/2016  . Carpal tunnel syndrome   . Coronary artery disease    DES to OM3 90% stenosis 2003, 90% small RCA stenosis.    . Diabetes mellitus   . History of stomach ulcers   . Hyperlipemia   . Hypertension   . Irregular heart beat     Past Surgical History:  Procedure Laterality Date  . ABDOMINAL HYSTERECTOMY    . CARDIAC CATHETERIZATION  2003   stent-  . CARPAL TUNNEL RELEASE  03/29/2012   Procedure: CARPAL TUNNEL RELEASE;  Surgeon: Wynonia Sours, MD;  Location: [place];  Service: Orthopedics;  Laterality: Left;  . EYE SURGERY     catracts  . LESION EXCISION WITH COMPLEX REPAIR Right 05/25/2016   Procedure: complex repair of 25cm wound;  Surgeon: Irene Limbo, MD;  Location: [place];  Service: Plastics;  Laterality: Right;  . MASTECTOMY MODIFIED RADICAL Right 05/25/2016   Procedure: RIGHT MASTECTOMY MODIFIED RADICAL;  Surgeon: Fanny Skates, MD;  Location: [place];  Service: General;  Laterality: Right;  . SHOULDER ARTHROSCOPY     right and left  . TRIGGER FINGER RELEASE  03/29/2012   Procedure: RELEASE TRIGGER FINGER/A-1 PULLEY;  Surgeon: Wynonia Sours, MD;  Location: [place];  Service: Orthopedics;  Laterality: Left;    There were no vitals filed for this visit.      Subjective Assessment - 07/07/16 1109    Subjective "doing fine, just sleepy"  daughter reports her mom is doing her exercise at home and she has all the sheets if she needs to double check    Pertinent History Hx includes memory problems and multiple medical problem including hip pain.  Breast cancer found in June 2017, did not respond to chemotherapy.  Extended right radical mastectomy with cmplex wound closure on Nov. 7, 2017 with 6 out of 21 nodes positive.    Patient Stated Goals daughter wants pt to get stronger and walk better and to get right arm stronger.   Currently in Pain? No/denies            [facility] PT Assessment - 07/07/16 0001      Observation/Other Assessments   Quick DASH  0     AROM   Right Shoulder Flexion 140 Degrees   Right Shoulder ABduction 134 Degrees           LYMPHEDEMA/ONCOLOGY QUESTIONNAIRE - 07/07/16 1133      Right Upper Extremity Lymphedema   10 cm Proximal to Olecranon Process 33.3 cm   Olecranon Process 26 cm   10 cm Proximal to Ulnar Styloid Process  21.3 cm   Just Proximal to Ulnar Styloid Process 15.6 cm   Across Hand at [employer] 19 cm   At [place] of 2nd Digit 6.1 cm           [name] - 07/07/16 0001    Open a tight or new jar No difficulty   Do heavy household chores (wash walls, wash floors) No difficulty   Carry a shopping bag or briefcase No difficulty   Wash your back No difficulty   Use a knife to cut food No difficulty   Recreational activities in which you take some force or impact through your arm, shoulder, or hand (golf, hammering, tennis) No difficulty   During the past week, to what extent has your arm, shoulder or hand problem interfered with your normal social activities with family, friends, neighbors, or groups? Not at all   During the past week, to what extent has your arm, shoulder or hand  problem limited your work or other regular daily activities Not at all   Arm, shoulder, or hand pain. None   Tingling (pins and needles) in your arm, shoulder, or hand None   Difficulty Sleeping No difficulty   DASH Score 0 %               [facility] Adult PT Treatment/Exercise - 07/07/16 0001      High Level Balance   High Level Balance Activities Side stepping;Backward walking;Direction changes;Sudden stops;Marching forwards;Other (comment)  with finger along wall for balance    High Level Balance Comments also changes in velocity      Knee/Hip Exercises: Aerobic   Recumbent Bike level 1      Knee/Hip Exercises: Standing   Hip Flexion AROM;Both;2 sets;10 reps   Hip Flexion Limitations holding onto back of bike    Hip Abduction AROM;Both;1 set;10 reps     Shoulder Exercises: Standing   Flexion AROM;Right;10 reps                        [facility] Goals - 07/07/16 1110      CC Long Term Goal  #1   Title Patient / daughter will verbalize an understanding of lymphedema risk reduction precautions   Baseline They both attended ABC class 06/21/16 and have no firther questions reporting this was very educational and informative-06/23/16   Status Achieved     CC Long Term Goal  #2   Title Patient will be independent in a  home exercise program for right shoulder range of motion and strength, LEstrength and general strength.   Status Achieved     CC Long Term Goal  #3   Title Pt will decrease Quick DASH score to 25 indicating an improvment in Right UE function   Baseline 38.64, 0 at 07/07/2016   Status Achieved     CC Long Term Goal  #4   Title Pt will increase right shoulder abduction to 125 so that she will be able to receive radiation treatment if needed.    Baseline 90 degrees; 116 degrees 06/23/16,  134 degrees abduction, 140 degrees flexion    Status Achieved            Plan - 07/07/16 1145    Clinical Impression Statement Pt has done very well  and has met her goals. She has improved in shoulder range of motion and mobility.  she has not had increase in circumference, but may have minor bit of swelling  at [employer] space.  Daughter is able to recognize it.  Do not feel this warrants complete decongestive therapy or even compression sleeve/glove at this point.  Daughter will contact us if her swelling increases and she needs further treatment.    Rehab Potential Good   Clinical Impairments Affecting Rehab Potential 21 lymph nodes removed, dereased memory    PT Frequency 2x / week   PT Duration 4 weeks   PT Treatment/Interventions ADLs/Self Care Home Management;Patient/family education;Orthotic Fit/Training;DME Instruction;Manual techniques;Therapeutic activities;Manual lymph drainage;Therapeutic exercise;Scar mobilization;Passive range of motion   PT Next Visit Plan discharge this episode    Consulted and Agree with Plan of Care Patient;Family member/caregiver   Family Member Consulted [place]      Patient will benefit from skilled therapeutic intervention in order to improve the following deficits and impairments:  Decreased endurance, Abnormal gait, Increased edema, Decreased knowledge of precautions, Decreased activity tolerance, Decreased knowledge of use of DME, Impaired UE functional use, Decreased strength, Difficulty walking, Decreased cognition, Decreased range of motion, Postural dysfunction  Visit Diagnosis: Abnormal posture  Stiffness of right shoulder, not elsewhere classified  Muscle weakness (generalized)  Localized swelling, mass and lump, trunk       G-Codes - 2016-07-26 1150    Functional Assessment Tool Used Quick DASH    Functional Limitation Carrying, moving and handling objects   Carrying, Moving and Handling Objects Goal Status (C3754) At least 20 percent but less than 40 percent impaired, limited or restricted   Carrying, Moving and Handling Objects Discharge Status [phone]) 0 percent impaired, limited or  restricted      Problem List Patient Active Problem List   Diagnosis Date Noted  . Lung metastases ([place]) 06/23/2016  . Cancer of overlapping sites of right female breast ([place]) 05/25/2016  . Breast cancer of lower-inner quadrant of right female breast ([place]) 01/22/2016   PHYSICAL THERAPY DISCHARGE SUMMARY  Visits from Start of Care: 8  Current functional level related to goals / functional outcomes: Improved.    Remaining deficits: As above    Education / Equipment: Home exercise, lymphedema risk reduction  Plan: Patient agrees to discharge.  Patient goals were met. Patient is being discharged due to meeting the stated rehab goals.  ?????    Donato Heinz. Owens Shark PT  [place] 26-Jul-2016, 11:50 AM  [place], [place], [address] Phone: [phone]   Fax:  [phone]  Name: RHYLIN VENTERS MRN: [id number] Date of Birth: [birth year]

## 2016-07-16 ENCOUNTER — Encounter: Payer: Medicare Other | Admitting: Physical Therapy

## 2016-07-21 ENCOUNTER — Encounter: Payer: Medicare Other | Admitting: Physical Therapy

## 2016-07-23 ENCOUNTER — Ambulatory Visit: Payer: Medicare Other | Admitting: Physical Therapy

## 2016-08-11 NOTE — Progress Notes (Signed)
Location of Breast Cancer: Right Breast Lower Inner  Quadrant (Stage IV,metastatix to right lung)  Histology per Pathology Report: 01/06/16: Right Breast bx,lower outer quadrant=invasive ductal carcinoma,DCIS, Lymph node,bx,right axilla=Positive for ductal carcinoma  Receptor Status: ER(0%neg), PR (0%neg), Her2-neu ( neg ratio 1.41), Ki-67(90%)  Did patient present with symptoms (if so, please note symptoms) or was this found on screening mammography?:   Past/Anticipated interventions by surgeon, if XTG:GYIRSWNIO 05/25/2016: Dr. Renelda Loma Ingram,MD Breast, modified radical mastectomy , Right - INVASIVE DUCTAL CARCINOMA WITH CALCIFICATIONS, GRADE III/III, SPANNING 7.7 CM. - MULTIPLE INTRADERMAL NODULES OF SIMILAR APPEARING DUCTAL CARCINOMA. - THE SURGICAL RESECTION MARGINS ARE NEGATIVE FOR CARCINOMA. - METASTATIC CARCINOMA IN 6 OF 21 LYMPH NODES (6/21) WITH EXTRACAPSULAR EXTENSION,  Dr. Dalbert Batman was  last seen 08/10/2016   Past/Anticipated interventions by medical oncology, if any: Chemotherapy : Dr. Jana Hakim, seen 06/23/2016,    (1) neoadjuvant chemotherapy consisting of capecitabine, 1500 mg twice daily, 7 days on 7 days off, starting 01/27/2016, discontinued 04/14/2016 with progression, , referral to radiation for to right chest wall, then follow up 10/01/16 after Pet scan   Lymphedema issues, if any:  NO  Pain issues, if any: NO  SAFETY ISSUES: yes, memory  Impairment   Prior radiation? NO  Pacemaker/ICD? NO  Possible current pregnancy?N/A  Is the patient on methotrexate?NO  Current Complaints / other details:  Menarche age 51, GXP67, 95st live birth age 58,total hysterectomy with b/l salpingo-oophorectomy in her 49's, no HRT quit smoking cigarettes 1979,no alcohol or drug use Cardiac catheretization 2003,    Mother Heart disease, Father deceased old age, Roslynn Amble, Felicita Gage, RN 08/11/2016,10:35 AM BP (!) 172/54 (BP Location: Left Arm, Patient Position: Sitting, Cuff Size: Normal)    Pulse (!) 58   Temp 98.2 F (36.8 C) (Oral)   Resp 16   Ht 5' 2.5" (1.588 m)   Wt 156 lb 12.8 oz (71.1 kg)   SpO2 100% Comment: room air  BMI 28.22 kg/m   Wt Readings from Last 3 Encounters:  08/12/16 156 lb 12.8 oz (71.1 kg)  06/23/16 155 lb 4.8 oz (70.4 kg)  05/25/16 164 lb 14.5 oz (74.8 kg)

## 2016-08-12 ENCOUNTER — Ambulatory Visit
Admission: RE | Admit: 2016-08-12 | Discharge: 2016-08-12 | Disposition: A | Discharge status: Home / Self Care | Payer: Medicare Other | Source: Ambulatory Visit | Attending: Radiation Oncology | Admitting: Radiation Oncology

## 2016-08-12 ENCOUNTER — Encounter: Payer: Self-pay | Admitting: Radiation Oncology

## 2016-08-12 VITALS — BP 172/54 | HR 58 | Temp 98.2°F | Resp 16 | Ht 62.5 in | Wt 156.8 lb

## 2016-08-12 DIAGNOSIS — C7801 Secondary malignant neoplasm of right lung: Secondary | ICD-10-CM

## 2016-08-12 DIAGNOSIS — Z171 Estrogen receptor negative status [ER-]: Secondary | ICD-10-CM | POA: Diagnosis not present

## 2016-08-12 DIAGNOSIS — C50311 Malignant neoplasm of lower-inner quadrant of right female breast: Secondary | ICD-10-CM | POA: Diagnosis present

## 2016-08-12 DIAGNOSIS — C50811 Malignant neoplasm of overlapping sites of right female breast: Secondary | ICD-10-CM

## 2016-08-12 DIAGNOSIS — Z510 Encounter for antineoplastic radiation therapy: Secondary | ICD-10-CM | POA: Diagnosis not present

## 2016-08-12 HISTORY — DX: Malignant neoplasm of unspecified site of unspecified female breast: C50.919

## 2016-08-12 NOTE — Progress Notes (Signed)
Please see the Nurse Progress Note in the MD Initial Consult Encounter for this patient. 

## 2016-08-12 NOTE — Progress Notes (Signed)
Radiation Oncology         [phone] ________________________________  Name: Jennifer Garrett MRN: [id number]  Date: 08/12/2016  DOB: 09-Apr-1932  WU:[id number] A [name], MD  Fanny Skates, MD     REFERRING PHYSICIAN: Fanny Skates, MD   DIAGNOSIS: Right breast cancer  HISTORY OF PRESENT ILLNESS: Jennifer Garrett is a 83 y.o. female seen at the request of Dr. Dalbert Batman for a recently diagnosed right breast cancer. She was originally seen after a palpable mass was noted. Diagnostic imaging revealed an irregular hypoechoic mass in the right breast at 5 o'clock 7 cm from the nipple measuring 5.4 x 3.9 x 4.1 cm. Sonographic evaluation of the right axilla showed 3 nodals masses. The largest nodal mass measured 3.6 x 1.8 x 3.7 cm. Biopsy of the right breast lower outer quadrant on 01/06/2016 revealed invasive ductal carcinoma and DCIS. This was grade 3 breast carcinoma, ER 0% / PR 0% / HER-2 negative / Ki-67 70%. Biopsy of the right axilla lymph node was positive for ductal carcinoma. Her metastatic work up was negative but had locoregional lymph node involvement by imaging. She does have s a small nodule in the right upper lobe measuring 4 mm. She went on to receive neoadjuvant chemotherapy, though this was interrupted by surgery due to concerns for progressive disease, but stable lung nodues. She subsequently underwent right modified radical mastectomy and axillary dissection on 05/25/2016 with Dr. Dalbert Batman. Pathology showed invasive ductal carcinoma with calcifications, grade III/III, spanning 7.7 cm. Multiple intradermal nodules of similar appearing ductal carcinoma. The surgical resection margins are negative for carcinoma. Metastatic carcinoma in 6 of 21 lymph nodes with extracapsular extension. ER 0% / PR 0% / HER-2 negative / Ki-67 90%. She comes today to discuss the role of adjuvant radiotherapy.   PREVIOUS RADIATION THERAPY: No   PAST MEDICAL HISTORY:  Past Medical History:  Diagnosis Date  .  Arthritis   . Asthma    rarely uses neb  . Breast cancer ([place]) 05/25/2016   right breast  . Breast cancer of lower-inner quadrant of right female breast ([place]) 01/22/2016  . Carpal tunnel syndrome   . Coronary artery disease    DES to OM3 90% stenosis 2003, 90% small RCA stenosis.    . Diabetes mellitus   . History of stomach ulcers   . Hyperlipemia   . Hypertension   . Irregular heart beat        PAST SURGICAL HISTORY: Past Surgical History:  Procedure Laterality Date  . ABDOMINAL HYSTERECTOMY    . CARDIAC CATHETERIZATION  2003   stent-  . CARPAL TUNNEL RELEASE  03/29/2012   Procedure: CARPAL TUNNEL RELEASE;  Surgeon: Wynonia Sours, MD;  Location: [place];  Service: Orthopedics;  Laterality: Left;  . EYE SURGERY     catracts  . LESION EXCISION WITH COMPLEX REPAIR Right 05/25/2016   Procedure: complex repair of 25cm wound;  Surgeon: Irene Limbo, MD;  Location: [place];  Service: Plastics;  Laterality: Right;  . MASTECTOMY MODIFIED RADICAL Right 05/25/2016   Procedure: RIGHT MASTECTOMY MODIFIED RADICAL;  Surgeon: Fanny Skates, MD;  Location: [facility];  Service: General;  Laterality: Right;  . SHOULDER ARTHROSCOPY     right and left  . TRIGGER FINGER RELEASE  03/29/2012   Procedure: RELEASE TRIGGER FINGER/A-1 PULLEY;  Surgeon: Wynonia Sours, MD;  Location: [facility];  Service: Orthopedics;  Laterality: Left;     FAMILY HISTORY:  Family History  Problem Relation  Age of Onset  . Heart disease Mother     No details      SOCIAL HISTORY:  reports that she quit smoking about 38 years ago. She has never used smokeless tobacco. She reports that she does not drink alcohol or use drugs. The patient is single. She lives in [place]. She is accompanied by her daughter [name].   ALLERGIES: Lyrica [pregabalin]; Feldene [piroxicam]; Lisinopril; Orudis [ketoprofen]; and Vibramycin [doxycycline calcium]   MEDICATIONS:  Current Outpatient Prescriptions    Medication Sig Dispense Refill  . albuterol (PROVENTIL HFA;VENTOLIN HFA) 108 (90 BASE) MCG/ACT inhaler Inhale 1 puff into the lungs every 6 (six) hours as needed for wheezing or shortness of breath. Reported on 02/04/2016    . aspirin EC 81 MG tablet Take 1 tablet by mouth every morning.    . Blood Glucose Monitoring Suppl (ACCU-CHEK AVIVA PLUS) w/Device KIT 1 Device by Does not apply route 2 (two) times daily. 1 kit 0  . calcium carbonate (OS-CAL) 600 MG TABS Take 600 mg by mouth 2 (two) times daily with a meal.    . diclofenac sodium (VOLTAREN) 1 % GEL Apply 1 application topically every other day.    . donepezil (ARICEPT) 5 MG tablet Take 5 mg by mouth at bedtime.    . ferrous sulfate 325 (65 FE) MG tablet Take 1 tablet (325 mg total) by mouth 2 (two) times daily with a meal. 60 tablet 3  . glipiZIDE (GLUCOTROL) 10 MG tablet Take 10 mg by mouth daily as needed (for high blood sugar (greater than 150)).     Marland Kitchen glucose blood (ACCU-CHEK AVIVA) test strip Use as instructed 100 each 12  . hydrochlorothiazide (MICROZIDE) 12.5 MG capsule Take 12.5 mg by mouth daily.    Marland Kitchen KLOR-CON M20 20 MEQ tablet Take 20 mEq by mouth daily.    . Lancets (ACCU-CHEK SOFT TOUCH) lancets Use as instructed 100 each 12  . naproxen sodium (ANAPROX) 220 MG tablet Take 220 mg by mouth 2 (two) times daily as needed (for pain.).    Marland Kitchen olmesartan (BENICAR) 40 MG tablet Take 40 mg by mouth daily.    Marland Kitchen atorvastatin (LIPITOR) 40 MG tablet Take 1 tablet (40 mg total) by mouth daily. (Patient taking differently: Take 40 mg by mouth daily at 6 PM. ) 90 tablet 3   No current facility-administered medications for this encounter.      REVIEW OF SYSTEMS: On review of systems, the patient reports that she is doing well overall. She denies any chest pain, shortness of breath, cough, fevers, chills, night sweats, unintended weight changes. She denies any bowel or bladder disturbances, and denies abdominal pain, nausea or vomiting. She  denies any new musculoskeletal or joint aches or pains. She denies lymphedema issues. A complete review of systems is obtained and is otherwise negative.     PHYSICAL EXAM:  Wt Readings from Last 3 Encounters:  08/12/16 156 lb 12.8 oz (71.1 kg)  06/23/16 155 lb 4.8 oz (70.4 kg)  05/25/16 164 lb 14.5 oz (74.8 kg)   Temp Readings from Last 3 Encounters:  08/12/16 98.2 F (36.8 C) (Oral)  06/23/16 98 F (36.7 C) (Oral)  05/27/16 98.4 F (36.9 C) (Oral)   BP Readings from Last 3 Encounters:  08/12/16 (!) 172/54  06/23/16 (!) 132/50  05/27/16 (!) 160/50   Pulse Readings from Last 3 Encounters:  08/12/16 (!) 58  06/23/16 (!) 58  05/27/16 66    In general this is a well  appearing African American female in no acute distress. She is alert and oriented x4 and appropriate throughout the examination. HEENT reveals that the patient is normocephalic, atraumatic. EOMs are intact. PERRLA. Skin is intact without any evidence of gross lesions. Cardiovascular exam reveals a regular rate and rhythm, no clicks rubs or murmurs are auscultated. Chest is clear to auscultation bilaterally. Lymphatic assessment is performed and does not reveal any adenopathy in the cervical, supraclavicular, axillary, or inguinal chains. Abdomen has active bowel sounds in all quadrants and is intact. The abdomen is soft, non tender, non distended. Lower extremities are negative for pretibial pitting edema, deep calf tenderness, cyanosis or clubbing. Breast exam reveals right chest wall significant for interval mastectomy scar with a small incision that extends vertically about 2 cm and is well healed. No evidence of cellulitic streaking or bleeding. No palpable adenopathy or lymphedema of the right axilla or extremity is noted.    ECOG = 1  0 - Asymptomatic (Fully active, able to carry on all predisease activities without restriction)  1 - Symptomatic but completely ambulatory (Restricted in physically strenuous activity  but ambulatory and able to carry out work of a light or sedentary nature. For example, light housework, office work)  2 - Symptomatic, <50% in bed during the day (Ambulatory and capable of all self care but unable to carry out any work activities. Up and about more than 50% of waking hours)  3 - Symptomatic, >50% in bed, but not bedbound (Capable of only limited self-care, confined to bed or chair 50% or more of waking hours)  4 - Bedbound (Completely disabled. Cannot carry on any self-care. Totally confined to bed or chair)  5 - Death   Eustace Pen MM, Creech RH, Tormey DC, et al. [phone]). "Toxicity and response criteria of the [facility]". [place] Oncol. 5 (6): 649-55    LABORATORY DATA:  Lab Results  Component Value Date   WBC 6.5 06/23/2016   HGB 9.2 (L) 06/23/2016   HCT 29.4 (L) 06/23/2016   MCV 86.5 06/23/2016   PLT 220 06/23/2016   Lab Results  Component Value Date   NA 143 06/23/2016   K 4.3 06/23/2016   CL 104 05/27/2016   CO2 26 06/23/2016   Lab Results  Component Value Date   ALT 12 06/23/2016   AST 19 06/23/2016   ALKPHOS 74 06/23/2016   BILITOT 0.29 06/23/2016      RADIOGRAPHY: No results found.     IMPRESSION/PLAN: 1. Triple negative, Stage IV invasive ductal carcinoma of the lower outer quadrant of the right breast. She has completed surgery and appears to be doing satisfactorily postoperatively. We discussed with the patient the role of adjuvant radiation treatment in this setting. We discussed the potential benefit of radiation treatment, especially with regards to local control of the patient's tumor. We also discussed the possible side effects and risks of such a treatment as well. The patient wishes to proceed with radiation treatment.. The patient will proceed with simulation next Monday. Dr. Lisbeth Renshaw recommends 6 1/2 weeks of treatment to the chest wall and regional lymph nodes.  The above documentation reflects my direct findings  during this shared patient visit. Please see the separate note by Dr. Lisbeth Renshaw on this date for the remainder of the patient's plan of care.    Carola Rhine, PAC  This document serves as a record of services personally performed by Kyung Rudd, MD and Shona Simpson, PA-C. It was created on  their behalf by Arlyce Harman, a trained medical scribe. The creation of this record is based on the scribe's personal observations and the provider's statements to them. This document has been checked and approved by the attending provider.

## 2016-08-16 ENCOUNTER — Ambulatory Visit
Admission: RE | Admit: 2016-08-16 | Discharge: 2016-08-16 | Disposition: A | Discharge status: Home / Self Care | Payer: Medicare Other | Source: Ambulatory Visit | Attending: Radiation Oncology | Admitting: Radiation Oncology

## 2016-08-16 DIAGNOSIS — Z510 Encounter for antineoplastic radiation therapy: Secondary | ICD-10-CM | POA: Diagnosis not present

## 2016-08-16 DIAGNOSIS — Z171 Estrogen receptor negative status [ER-]: Principal | ICD-10-CM

## 2016-08-16 DIAGNOSIS — C50311 Malignant neoplasm of lower-inner quadrant of right female breast: Secondary | ICD-10-CM

## 2016-08-17 ENCOUNTER — Ambulatory Visit (INDEPENDENT_AMBULATORY_CARE_PROVIDER_SITE_OTHER): Payer: Medicare Other | Admitting: Podiatry

## 2016-08-17 ENCOUNTER — Encounter: Payer: Self-pay | Admitting: Podiatry

## 2016-08-17 VITALS — Ht 62.5 in | Wt 155.0 lb

## 2016-08-17 DIAGNOSIS — M79609 Pain in unspecified limb: Secondary | ICD-10-CM

## 2016-08-17 DIAGNOSIS — B351 Tinea unguium: Secondary | ICD-10-CM | POA: Diagnosis not present

## 2016-08-17 DIAGNOSIS — E1140 Type 2 diabetes mellitus with diabetic neuropathy, unspecified: Secondary | ICD-10-CM

## 2016-08-17 NOTE — Progress Notes (Signed)
Patient ID: ISATOU AGREDANO, female   DOB: [birth year], 83 y.o.   MRN: [id number] Complaint:  Visit Type: Patient returns to my office for continued preventative foot care services. Complaint: Patient states" my nails have grown long and thick and become painful to walk and wear shoes" Patient has been diagnosed with DM with no foot complications. The patient presents for preventative foot care services. No changes to ROS  Podiatric Exam: Vascular: dorsalis pedis and posterior tibial pulses are palpable bilateral. Capillary return is immediate. Temperature gradient is WNL. Skin turgor WNL  Sensorium: Decreased  Semmes Weinstein monofilament test. Normal tactile sensation bilaterally. Nail Exam: Pt has thick disfigured discolored nails with subungual debris noted bilateral entire nail hallux through fifth toenails Ulcer Exam: There is no evidence of ulcer or pre-ulcerative changes or infection. Orthopedic Exam: Muscle tone and strength are WNL. No limitations in general ROM. No crepitus or effusions noted. Foot type and digits show no abnormalities. Bony prominences are unremarkable. Skin: No Porokeratosis. No infection or ulcers  Diagnosis:  Onychomycosis, , Pain in right toe, pain in left toes  Treatment & Plan Procedures and Treatment: Consent by patient was obtained for treatment procedures. The patient understood the discussion of treatment and procedures well. All questions were answered thoroughly reviewed. Debridement of mycotic and hypertrophic toenails, 1 through 5 bilateral and clearing of subungual debris. No ulceration, no infection noted.  Return Visit-Office Procedure: Patient instructed to return to the office for a follow up visit 3 months for continued evaluation and treatment.    Gardiner Barefoot DPM

## 2016-08-19 NOTE — Progress Notes (Signed)
  Radiation Oncology         [phone]) [phone] ________________________________  Name: Jennifer Garrett MRN: [id number]  Date: 08/16/2016  DOB: January 03, 1932  DIAGNOSIS:     ICD-9-CM ICD-10-CM   1. Malignant neoplasm of lower-inner quadrant of right breast of female, estrogen receptor negative ([facility]) 174.3 C50.311    V86.1 Z17.1      SIMULATION AND TREATMENT PLANNING NOTE  The patient presented for simulation prior to beginning her course of radiation treatment for her diagnosis of right-sided breast cancer. The patient was placed in a supine position on a breast board. A customized vac-lock bag was also constructed and this complex treatment device will be used on a daily basis during her treatment. In this fashion, a CT scan was obtained through the chest area and an isocenter was placed near the chest wall at the upper aspect of the right chest.  The patient will be planned to receive a course of radiation initially to a dose of 50.4 gray. This will consist of a 4 field technique targeting the right chest wall as well as the supraclavicular region. Therefore 2 customized medial and lateral tangent fields have been created targeting the chest wall, and also 2 additional customized fields have been designed to treat the supraclavicular region both with a right supraclavicular field and a right posterior axillary boost field. A forward planning/reduced field technique will also be evaluated to determine if this significantly improves the dose homogeneity of the overall plan. Therefore, additional customized blocks/fields may be necessary.  This initial treatment will be accomplished at 1.8 gray per fraction.   The initial plan will consist of a 3-D conformal technique. The target volume/scar, heart and lungs have been contoured and dose volume histograms of each of these structures will be evaluated as part of the 3-D conformal treatment planning process.   It is anticipated that the patient will then  receive a 10 gray boost to the surgical scar. This will be accomplished at 2 gray per fraction. The final anticipated total dose therefore will correspond to 60.4 gray.    _______________________________   [name], MD, PhD

## 2016-08-19 NOTE — Progress Notes (Signed)
  Radiation Oncology         [phone]) [phone] ________________________________  Name: Jennifer Garrett MRN: [id number]  Date: 08/16/2016  DOB: April 16, 1932  Optical Surface Tracking Plan:  Since intensity modulated radiotherapy (IMRT) and 3D conformal radiation treatment methods are predicated on accurate and precise positioning for treatment, intrafraction motion monitoring is medically necessary to ensure accurate and safe treatment delivery.  The ability to quantify intrafraction motion without excessive ionizing radiation dose can only be performed with optical surface tracking. Accordingly, surface imaging offers the opportunity to obtain 3D measurements of patient position throughout IMRT and 3D treatments without excessive radiation exposure.  I am ordering optical surface tracking for this patient's upcoming course of radiotherapy. ________________________________  Kyung Rudd, MD 08/19/2016 8:30 PM    Reference:   Ursula Alert, J, et al. Surface imaging-based analysis of intrafraction motion for breast radiotherapy patients.Journal of [place], n. 6, nov. 2014. ISSN [id number].   Available at: <http://www.jacmp.org/index.php/jacmp/article/view/4957>.

## 2016-08-20 DIAGNOSIS — Z510 Encounter for antineoplastic radiation therapy: Secondary | ICD-10-CM | POA: Diagnosis not present

## 2016-08-23 ENCOUNTER — Ambulatory Visit
Admission: RE | Admit: 2016-08-23 | Discharge: 2016-08-23 | Disposition: A | Discharge status: Home / Self Care | Payer: Medicare Other | Source: Ambulatory Visit | Attending: Radiation Oncology | Admitting: Radiation Oncology

## 2016-08-23 DIAGNOSIS — Z510 Encounter for antineoplastic radiation therapy: Secondary | ICD-10-CM | POA: Diagnosis not present

## 2016-08-24 ENCOUNTER — Ambulatory Visit
Admission: RE | Admit: 2016-08-24 | Discharge: 2016-08-24 | Disposition: A | Discharge status: Home / Self Care | Payer: Medicare Other | Source: Ambulatory Visit | Attending: Radiation Oncology | Admitting: Radiation Oncology

## 2016-08-24 ENCOUNTER — Encounter (HOSPITAL_COMMUNITY)
Admission: RE | Admit: 2016-08-24 | Discharge: 2016-08-24 | Disposition: A | Discharge status: Home / Self Care | Payer: Medicare Other | Source: Ambulatory Visit | Attending: Oncology | Admitting: Oncology

## 2016-08-24 DIAGNOSIS — C50311 Malignant neoplasm of lower-inner quadrant of right female breast: Secondary | ICD-10-CM | POA: Insufficient documentation

## 2016-08-24 DIAGNOSIS — C50811 Malignant neoplasm of overlapping sites of right female breast: Secondary | ICD-10-CM | POA: Diagnosis present

## 2016-08-24 DIAGNOSIS — Z171 Estrogen receptor negative status [ER-]: Secondary | ICD-10-CM | POA: Insufficient documentation

## 2016-08-24 DIAGNOSIS — C7801 Secondary malignant neoplasm of right lung: Secondary | ICD-10-CM | POA: Insufficient documentation

## 2016-08-24 DIAGNOSIS — Z510 Encounter for antineoplastic radiation therapy: Secondary | ICD-10-CM | POA: Diagnosis not present

## 2016-08-24 LAB — GLUCOSE, CAPILLARY: Glucose-Capillary: 141 mg/dL — ABNORMAL HIGH (ref 65–99)

## 2016-08-24 MED ORDER — ALRA NON-METALLIC DEODORANT (RAD-ONC)
1.0000 | Freq: Once | TOPICAL | Status: AC
Start: 2016-08-24 — End: 2016-08-24
  Administered 2016-08-24: 1 via TOPICAL

## 2016-08-24 MED ORDER — FLUDEOXYGLUCOSE F - 18 (FDG) INJECTION: 7.7000 | Freq: Once | INTRAVENOUS | Status: DC | PRN

## 2016-08-24 MED ORDER — RADIAPLEXRX EX GEL
Freq: Once | CUTANEOUS | Status: AC
  Administered 2016-08-24: 14:00:00 via TOPICAL

## 2016-08-25 ENCOUNTER — Ambulatory Visit
Admission: RE | Admit: 2016-08-25 | Discharge: 2016-08-25 | Disposition: A | Discharge status: Home / Self Care | Payer: Medicare Other | Source: Ambulatory Visit | Attending: Radiation Oncology | Admitting: Radiation Oncology

## 2016-08-25 DIAGNOSIS — Z510 Encounter for antineoplastic radiation therapy: Secondary | ICD-10-CM | POA: Diagnosis not present

## 2016-08-25 NOTE — Progress Notes (Addendum)
Pt education done Right Breast,, alra,radiaplex gel, my business card, radiation therapy and you book given, discussed side effects,skin irritation, sharp pains in breast, fatigue, tenderness /swelling of breast, use luke warm showers/bath, no rubbing, scrubbing or scratching , no shaving unless electric shaver, no under wire bra if possible, dove soap unscented preferred, apply radiaplex daily after rad tx and again at bedtime, nothing applied to breast area 4 hours before rad tx, increase protein in diet, drink more water,stay hydrated,, sees MD weekly and prn,  daughter and patient teachback given 1:27 PM

## 2016-08-26 ENCOUNTER — Ambulatory Visit: Payer: Medicare Other

## 2016-08-26 ENCOUNTER — Ambulatory Visit
Admission: RE | Admit: 2016-08-26 | Discharge: 2016-08-26 | Disposition: A | Discharge status: Home / Self Care | Payer: Medicare Other | Source: Ambulatory Visit | Attending: Radiation Oncology | Admitting: Radiation Oncology

## 2016-08-26 DIAGNOSIS — Z510 Encounter for antineoplastic radiation therapy: Secondary | ICD-10-CM | POA: Diagnosis not present

## 2016-08-27 ENCOUNTER — Encounter: Payer: Self-pay | Admitting: Radiation Oncology

## 2016-08-27 ENCOUNTER — Ambulatory Visit
Admission: RE | Admit: 2016-08-27 | Discharge: 2016-08-27 | Disposition: A | Discharge status: Home / Self Care | Payer: Medicare Other | Source: Ambulatory Visit | Attending: Radiation Oncology | Admitting: Radiation Oncology

## 2016-08-27 VITALS — BP 180/53 | HR 55 | Temp 97.7°F | Resp 20 | Wt 160.2 lb

## 2016-08-27 DIAGNOSIS — Z171 Estrogen receptor negative status [ER-]: Principal | ICD-10-CM

## 2016-08-27 DIAGNOSIS — Z510 Encounter for antineoplastic radiation therapy: Secondary | ICD-10-CM | POA: Diagnosis not present

## 2016-08-27 DIAGNOSIS — C50311 Malignant neoplasm of lower-inner quadrant of right female breast: Secondary | ICD-10-CM

## 2016-08-27 NOTE — Progress Notes (Addendum)
Weekly rad txs right breast/ chest wall  4/33 completed, no skin changes, has radiaplex to start today, no c/o pian, appetite good no pain, just tenderness under axilla 9:15 AM BP (!) 180/53 (BP Location: Left Arm, Patient Position: Sitting, Cuff Size: Large)   Pulse (!) 55   Temp 97.7 F (36.5 C) (Oral)   Resp 20   Wt 160 lb 3.2 oz (72.7 kg)   BMI 28.83 kg/m   Wt Readings from Last 3 Encounters:  08/27/16 160 lb 3.2 oz (72.7 kg)  08/17/16 155 lb (70.3 kg)  08/12/16 156 lb 12.8 oz (71.1 kg)

## 2016-08-27 NOTE — Progress Notes (Signed)
Department of Radiation Oncology  Phone:  [phone] Fax:        [phone]  Weekly Treatment Note    Name: Jennifer Garrett Date: 08/27/2016 MRN: [id number] DOB: 01/10/1932   Diagnosis:     ICD-9-CM ICD-10-CM   1. Malignant neoplasm of lower-inner quadrant of right breast of female, estrogen receptor negative ([place]) 174.3 C50.311    V86.1 Z17.1      Current dose: 7.2 Gy  Current fraction: 4   MEDICATIONS: Current Outpatient Prescriptions  Medication Sig Dispense Refill  . albuterol (PROVENTIL HFA;VENTOLIN HFA) 108 (90 BASE) MCG/ACT inhaler Inhale 1 puff into the lungs every 6 (six) hours as needed for wheezing or shortness of breath. Reported on 02/04/2016    . aspirin EC 81 MG tablet Take 1 tablet by mouth every morning.    . Blood Glucose Monitoring Suppl (ACCU-CHEK AVIVA PLUS) w/Device KIT 1 Device by Does not apply route 2 (two) times daily. 1 kit 0  . calcium carbonate (OS-CAL) 600 MG TABS Take 600 mg by mouth 2 (two) times daily with a meal.    . diclofenac sodium (VOLTAREN) 1 % GEL Apply 1 application topically every other day.    . donepezil (ARICEPT) 5 MG tablet Take 5 mg by mouth at bedtime.    . ferrous sulfate 325 (65 FE) MG tablet Take 1 tablet (325 mg total) by mouth 2 (two) times daily with a meal. 60 tablet 3  . glipiZIDE (GLUCOTROL) 10 MG tablet Take 10 mg by mouth daily as needed (for high blood sugar (greater than 150)).     Marland Kitchen glucose blood (ACCU-CHEK AVIVA) test strip Use as instructed 100 each 12  . hyaluronate sodium (RADIAPLEXRX) GEL Apply 1 application topically.    . hydrochlorothiazide (MICROZIDE) 12.5 MG capsule Take 12.5 mg by mouth daily.    . Lancets (ACCU-CHEK SOFT TOUCH) lancets Use as instructed 100 each 12  . naproxen sodium (ANAPROX) 220 MG tablet Take 220 mg by mouth 2 (two) times daily as needed (for pain.).    Marland Kitchen non-metallic deodorant (ALRA) MISC Apply 1 application topically daily as needed.    Marland Kitchen olmesartan (BENICAR) 40 MG tablet Take  40 mg by mouth daily.    Marland Kitchen atorvastatin (LIPITOR) 40 MG tablet Take 1 tablet (40 mg total) by mouth daily. (Patient taking differently: Take 40 mg by mouth daily at 6 PM. ) 90 tablet 3  . KLOR-CON M20 20 MEQ tablet Take 20 mEq by mouth daily.     No current facility-administered medications for this encounter.    Facility-Administered Medications Ordered in Other Encounters  Medication Dose Route Frequency Provider Last Rate Last Dose  . fludeoxyglucose F - 18 (FDG) injection 7.7 millicurie  7.7 millicurie Intravenous Once PRN Earle Gell, MD         ALLERGIES: Lyrica [pregabalin]; Feldene [piroxicam]; Lisinopril; Orudis [ketoprofen]; and Vibramycin [doxycycline calcium]   LABORATORY DATA:  Lab Results  Component Value Date   WBC 6.5 06/23/2016   HGB 9.2 (L) 06/23/2016   HCT 29.4 (L) 06/23/2016   MCV 86.5 06/23/2016   PLT 220 06/23/2016   Lab Results  Component Value Date   NA 143 06/23/2016   K 4.3 06/23/2016   CL 104 05/27/2016   CO2 26 06/23/2016   Lab Results  Component Value Date   ALT 12 06/23/2016   AST 19 06/23/2016   ALKPHOS 74 06/23/2016   BILITOT 0.29 06/23/2016     NARRATIVE: Jennifer Garrett was seen  today for weekly treatment management. The chart was checked and the patient's films were reviewed.  The patient is doing very well after her first week of treatment. No difficulties so far. The patient has begun using skin cream. All of her questions were answered today.   PHYSICAL EXAMINATION: weight is 160 lb 3.2 oz (72.7 kg). Her oral temperature is 97.7 F (36.5 C). Her blood pressure is 180/53 (abnormal) and her pulse is 55 (abnormal). Her respiration is 20.        ASSESSMENT: The patient is doing satisfactorily with treatment.  PLAN: We will continue with the patient's radiation treatment as planned.

## 2016-08-30 ENCOUNTER — Ambulatory Visit
Admission: RE | Admit: 2016-08-30 | Discharge: 2016-08-30 | Disposition: A | Discharge status: Home / Self Care | Payer: Medicare Other | Source: Ambulatory Visit | Attending: Radiation Oncology | Admitting: Radiation Oncology

## 2016-08-30 DIAGNOSIS — Z510 Encounter for antineoplastic radiation therapy: Secondary | ICD-10-CM | POA: Diagnosis not present

## 2016-08-31 ENCOUNTER — Ambulatory Visit
Admission: RE | Admit: 2016-08-31 | Discharge: 2016-08-31 | Disposition: A | Discharge status: Home / Self Care | Payer: Medicare Other | Source: Ambulatory Visit | Attending: Radiation Oncology | Admitting: Radiation Oncology

## 2016-08-31 DIAGNOSIS — Z510 Encounter for antineoplastic radiation therapy: Secondary | ICD-10-CM | POA: Diagnosis not present

## 2016-09-01 ENCOUNTER — Ambulatory Visit
Admission: RE | Admit: 2016-09-01 | Discharge: 2016-09-01 | Disposition: A | Discharge status: Home / Self Care | Payer: Medicare Other | Source: Ambulatory Visit | Attending: Radiation Oncology | Admitting: Radiation Oncology

## 2016-09-01 DIAGNOSIS — Z510 Encounter for antineoplastic radiation therapy: Secondary | ICD-10-CM | POA: Diagnosis not present

## 2016-09-02 ENCOUNTER — Encounter: Payer: Self-pay | Admitting: Radiation Oncology

## 2016-09-02 ENCOUNTER — Ambulatory Visit
Admission: RE | Admit: 2016-09-02 | Discharge: 2016-09-02 | Disposition: A | Discharge status: Home / Self Care | Payer: Medicare Other | Source: Ambulatory Visit | Attending: Radiation Oncology | Admitting: Radiation Oncology

## 2016-09-02 VITALS — BP 176/62 | HR 58 | Temp 98.8°F | Resp 18 | Ht 62.5 in | Wt 157.2 lb

## 2016-09-02 DIAGNOSIS — Z510 Encounter for antineoplastic radiation therapy: Secondary | ICD-10-CM | POA: Diagnosis not present

## 2016-09-02 DIAGNOSIS — C50311 Malignant neoplasm of lower-inner quadrant of right female breast: Secondary | ICD-10-CM

## 2016-09-02 DIAGNOSIS — Z171 Estrogen receptor negative status [ER-]: Principal | ICD-10-CM

## 2016-09-02 NOTE — Progress Notes (Signed)
 Department of Radiation Oncology  Phone:  [phone] Fax:        [phone]  Weekly Treatment Note    Name: Jennifer Garrett Date: 09/03/2016 MRN: [id number] DOB: 10/08/1931   Diagnosis:     ICD-9-CM ICD-10-CM   1. Malignant neoplasm of lower-inner quadrant of right breast of female, estrogen receptor negative (HCC) 174.3 C50.311    V86.1 Z17.1      Current dose: 14.4 Gy  Current fraction: 8   MEDICATIONS: Current Outpatient Prescriptions  Medication Sig Dispense Refill  . aspirin EC 81 MG tablet Take 1 tablet by mouth every morning.    . Blood Glucose Monitoring Suppl (ACCU-CHEK AVIVA PLUS) w/Device KIT 1 Device by Does not apply route 2 (two) times daily. 1 kit 0  . calcium carbonate (OS-CAL) 600 MG TABS Take 600 mg by mouth 2 (two) times daily with a meal.    . diclofenac sodium (VOLTAREN) 1 % GEL Apply 1 application topically every other day.    . donepezil (ARICEPT) 5 MG tablet Take 5 mg by mouth at bedtime.    . ferrous sulfate 325 (65 FE) MG tablet Take 1 tablet (325 mg total) by mouth 2 (two) times daily with a meal. 60 tablet 3  . glipiZIDE (GLUCOTROL) 10 MG tablet Take 10 mg by mouth daily as needed (for high blood sugar (greater than 150)).     . glucose blood (ACCU-CHEK AVIVA) test strip Use as instructed 100 each 12  . hyaluronate sodium (RADIAPLEXRX) GEL Apply 1 application topically.    . hydrochlorothiazide (MICROZIDE) 12.5 MG capsule Take 12.5 mg by mouth daily.    . KLOR-CON M20 20 MEQ tablet Take 20 mEq by mouth daily.    . Lancets (ACCU-CHEK SOFT TOUCH) lancets Use as instructed 100 each 12  . non-metallic deodorant (ALRA) MISC Apply 1 application topically daily as needed.    . olmesartan (BENICAR) 40 MG tablet Take 40 mg by mouth daily.    . albuterol (PROVENTIL HFA;VENTOLIN HFA) 108 (90 BASE) MCG/ACT inhaler Inhale 1 puff into the lungs every 6 (six) hours as needed for wheezing or shortness of breath. Reported on 02/04/2016    . atorvastatin  (LIPITOR) 40 MG tablet Take 1 tablet (40 mg total) by mouth daily. (Patient taking differently: Take 40 mg by mouth daily at 6 PM. ) 90 tablet 3  . naproxen sodium (ANAPROX) 220 MG tablet Take 220 mg by mouth 2 (two) times daily as needed (for pain.).     No current facility-administered medications for this encounter.      ALLERGIES: Lyrica [pregabalin]; Feldene [piroxicam]; Lisinopril; Orudis [ketoprofen]; and Vibramycin [doxycycline calcium]   LABORATORY DATA:  Lab Results  Component Value Date   WBC 6.5 06/23/2016   HGB 9.2 (L) 06/23/2016   HCT 29.4 (L) 06/23/2016   MCV 86.5 06/23/2016   PLT 220 06/23/2016   Lab Results  Component Value Date   NA 143 06/23/2016   K 4.3 06/23/2016   CL 104 05/27/2016   CO2 26 06/23/2016   Lab Results  Component Value Date   ALT 12 06/23/2016   AST 19 06/23/2016   ALKPHOS 74 06/23/2016   BILITOT 0.29 06/23/2016     NARRATIVE: Jennifer Garrett was seen today for weekly treatment management. The chart was checked and the patient's films were reviewed.  The patient has completed 8/33 treatments to the right chest wall. The nurse notes the hyperpigmentation of the right axilla. The patient's tenderness of the   right chest wall has resolved. She is using radiaplex and has a good appetite.   PHYSICAL EXAMINATION: height is 5' 2.5" (1.588 m) and weight is 157 lb 3.2 oz (71.3 kg). Her oral temperature is 98.8 F (37.1 C). Her blood pressure is 176/62 (abnormal) and her pulse is 58 (abnormal). Her respiration is 18 and oxygen saturation is 97%.       No desquamation of the right chest wall.  ASSESSMENT: The patient is doing satisfactorily with treatment.  PLAN: We will continue with the patient's radiation treatment as planned.   ------------------------------------------------  [name], MD, PhD  This document serves as a record of services personally performed by Kyung Rudd, MD. It was created on his behalf by Darcus Austin, a trained  medical scribe. The creation of this record is based on the scribe's personal observations and the provider's statements to them. This document has been checked and approved by the attending provider.

## 2016-09-02 NOTE — Progress Notes (Signed)
Weekly rad txs right breast/ chest wall  8/33 completed,  skin under axilla hyperpigmentation,tenderness under the right breast has resolved.  Using  radiaplex gel.  Appetite is good. Wt Readings from Last 3 Encounters:  09/02/16 157 lb 3.2 oz (71.3 kg)  08/27/16 160 lb 3.2 oz (72.7 kg)  08/17/16 155 lb (70.3 kg)  BP (!) 176/62   Pulse (!) 58   Temp 98.8 F (37.1 C) (Oral)   Resp 18   Ht 5' 2.5" (1.588 m)   Wt 157 lb 3.2 oz (71.3 kg)   SpO2 97%   BMI 28.29 kg/m

## 2016-09-03 ENCOUNTER — Encounter: Payer: Self-pay | Admitting: Radiation Oncology

## 2016-09-03 ENCOUNTER — Ambulatory Visit
Admission: RE | Admit: 2016-09-03 | Discharge: 2016-09-03 | Disposition: A | Discharge status: Home / Self Care | Payer: Medicare Other | Source: Ambulatory Visit | Attending: Radiation Oncology | Admitting: Radiation Oncology

## 2016-09-03 DIAGNOSIS — Z510 Encounter for antineoplastic radiation therapy: Secondary | ICD-10-CM | POA: Diagnosis not present

## 2016-09-06 ENCOUNTER — Ambulatory Visit
Admission: RE | Admit: 2016-09-06 | Discharge: 2016-09-06 | Disposition: A | Discharge status: Home / Self Care | Payer: Medicare Other | Source: Ambulatory Visit | Attending: Radiation Oncology | Admitting: Radiation Oncology

## 2016-09-06 DIAGNOSIS — Z510 Encounter for antineoplastic radiation therapy: Secondary | ICD-10-CM | POA: Diagnosis not present

## 2016-09-07 ENCOUNTER — Ambulatory Visit
Admission: RE | Admit: 2016-09-07 | Discharge: 2016-09-07 | Disposition: A | Discharge status: Home / Self Care | Payer: Medicare Other | Source: Ambulatory Visit | Attending: Radiation Oncology | Admitting: Radiation Oncology

## 2016-09-07 DIAGNOSIS — Z510 Encounter for antineoplastic radiation therapy: Secondary | ICD-10-CM | POA: Diagnosis not present

## 2016-09-08 ENCOUNTER — Ambulatory Visit
Admission: RE | Admit: 2016-09-08 | Discharge: 2016-09-08 | Disposition: A | Discharge status: Home / Self Care | Payer: Medicare Other | Source: Ambulatory Visit | Attending: Radiation Oncology | Admitting: Radiation Oncology

## 2016-09-08 DIAGNOSIS — Z510 Encounter for antineoplastic radiation therapy: Secondary | ICD-10-CM | POA: Diagnosis not present

## 2016-09-09 ENCOUNTER — Ambulatory Visit
Admission: RE | Admit: 2016-09-09 | Discharge: 2016-09-09 | Disposition: A | Discharge status: Home / Self Care | Payer: Medicare Other | Source: Ambulatory Visit | Attending: Radiation Oncology | Admitting: Radiation Oncology

## 2016-09-09 DIAGNOSIS — Z510 Encounter for antineoplastic radiation therapy: Secondary | ICD-10-CM | POA: Diagnosis not present

## 2016-09-10 ENCOUNTER — Ambulatory Visit
Admission: RE | Admit: 2016-09-10 | Discharge: 2016-09-10 | Disposition: A | Discharge status: Home / Self Care | Payer: Medicare Other | Source: Ambulatory Visit | Attending: Radiation Oncology | Admitting: Radiation Oncology

## 2016-09-10 ENCOUNTER — Encounter: Payer: Self-pay | Admitting: Radiation Oncology

## 2016-09-10 VITALS — BP 137/64 | HR 60 | Temp 98.0°F | Resp 20 | Wt 153.4 lb

## 2016-09-10 DIAGNOSIS — Z510 Encounter for antineoplastic radiation therapy: Secondary | ICD-10-CM | POA: Diagnosis not present

## 2016-09-10 DIAGNOSIS — C50311 Malignant neoplasm of lower-inner quadrant of right female breast: Secondary | ICD-10-CM

## 2016-09-10 DIAGNOSIS — Z171 Estrogen receptor negative status [ER-]: Principal | ICD-10-CM

## 2016-09-10 NOTE — Progress Notes (Signed)
Weekly rad txcs left breast 14/33 completed, mild skin tanning, skin intact, no pain, appetite good, using radiaplex bid 8:39 AM BP 137/64 (BP Location: Left Arm, Patient Position: Sitting, Cuff Size: Normal)   Pulse 60   Temp 98 F (36.7 C) (Oral)   Resp 20   Wt 153 lb 6.4 oz (69.6 kg)   BMI 27.61 kg/m    Wt Readings from Last 3 Encounters:  09/10/16 153 lb 6.4 oz (69.6 kg)  09/02/16 157 lb 3.2 oz (71.3 kg)  08/27/16 160 lb 3.2 oz (72.7 kg)

## 2016-09-12 NOTE — Progress Notes (Signed)
Department of Radiation Oncology  Phone:  [phone] Fax:        [phone]  Weekly Treatment Note    Name: Jennifer Garrett Date: 09/12/2016 MRN: [id number] DOB: Jul 08, 1932   Diagnosis:     ICD-9-CM ICD-10-CM   1. Malignant neoplasm of lower-inner quadrant of right breast of female, estrogen receptor negative ([place]) 174.3 C50.311    V86.1 Z17.1      Current dose: 25.2 Gy  Current fraction: 14   MEDICATIONS: Current Outpatient Prescriptions  Medication Sig Dispense Refill  . albuterol (PROVENTIL HFA;VENTOLIN HFA) 108 (90 BASE) MCG/ACT inhaler Inhale 1 puff into the lungs every 6 (six) hours as needed for wheezing or shortness of breath. Reported on 02/04/2016    . aspirin EC 81 MG tablet Take 1 tablet by mouth every morning.    . Blood Glucose Monitoring Suppl (ACCU-CHEK AVIVA PLUS) w/Device KIT 1 Device by Does not apply route 2 (two) times daily. 1 kit 0  . calcium carbonate (OS-CAL) 600 MG TABS Take 600 mg by mouth 2 (two) times daily with a meal.    . diclofenac sodium (VOLTAREN) 1 % GEL Apply 1 application topically every other day.    . donepezil (ARICEPT) 5 MG tablet Take 5 mg by mouth at bedtime.    . ferrous sulfate 325 (65 FE) MG tablet Take 1 tablet (325 mg total) by mouth 2 (two) times daily with a meal. 60 tablet 3  . glipiZIDE (GLUCOTROL) 10 MG tablet Take 10 mg by mouth daily as needed (for high blood sugar (greater than 150)).     Marland Kitchen glucose blood (ACCU-CHEK AVIVA) test strip Use as instructed 100 each 12  . hyaluronate sodium (RADIAPLEXRX) GEL Apply 1 application topically.    . hydrochlorothiazide (MICROZIDE) 12.5 MG capsule Take 12.5 mg by mouth daily.    Marland Kitchen KLOR-CON M20 20 MEQ tablet Take 20 mEq by mouth daily.    . Lancets (ACCU-CHEK SOFT TOUCH) lancets Use as instructed 100 each 12  . naproxen sodium (ANAPROX) 220 MG tablet Take 220 mg by mouth 2 (two) times daily as needed (for pain.).    Marland Kitchen non-metallic deodorant (ALRA) MISC Apply 1 application  topically daily as needed.    Marland Kitchen olmesartan (BENICAR) 40 MG tablet Take 40 mg by mouth daily.    Marland Kitchen atorvastatin (LIPITOR) 40 MG tablet Take 1 tablet (40 mg total) by mouth daily. (Patient taking differently: Take 40 mg by mouth daily at 6 PM. ) 90 tablet 3   No current facility-administered medications for this encounter.      ALLERGIES: Lyrica [pregabalin]; Feldene [piroxicam]; Lisinopril; Orudis [ketoprofen]; and Vibramycin [doxycycline calcium]   LABORATORY DATA:  Lab Results  Component Value Date   WBC 6.5 06/23/2016   HGB 9.2 (L) 06/23/2016   HCT 29.4 (L) 06/23/2016   MCV 86.5 06/23/2016   PLT 220 06/23/2016   Lab Results  Component Value Date   NA 143 06/23/2016   K 4.3 06/23/2016   CL 104 05/27/2016   CO2 26 06/23/2016   Lab Results  Component Value Date   ALT 12 06/23/2016   AST 19 06/23/2016   ALKPHOS 74 06/23/2016   BILITOT 0.29 06/23/2016     NARRATIVE: SAMENTHA PERHAM was seen today for weekly treatment management. The chart was checked and the patient's films were reviewed.  The patient states she is doing very well this week. No pain. Good energy level.  Weekly rad txcs left breast 14/33 completed, mild skin  tanning, skin intact, no pain, appetite good, using radiaplex bid 3:00 PM BP 137/64 (BP Location: Left Arm, Patient Position: Sitting, Cuff Size: Normal)   Pulse 60   Temp 98 F (36.7 C) (Oral)   Resp 20   Wt 153 lb 6.4 oz (69.6 kg)   BMI 27.61 kg/m    Wt Readings from Last 3 Encounters:  09/10/16 153 lb 6.4 oz (69.6 kg)  09/02/16 157 lb 3.2 oz (71.3 kg)  08/27/16 160 lb 3.2 oz (72.7 kg)     PHYSICAL EXAMINATION: weight is 153 lb 6.4 oz (69.6 kg). Her oral temperature is 98 F (36.7 C). Her blood pressure is 137/64 and her pulse is 60. Her respiration is 20.      Hyperpigmentation without any significant desquamation.  ASSESSMENT: The patient is doing satisfactorily with treatment.  PLAN: We will continue with the patient's radiation  treatment as planned.

## 2016-09-12 NOTE — Addendum Note (Signed)
Encounter addended by: Kyung Rudd, MD on: 09/12/2016  3:00 PM<BR>    Actions taken: Sign clinical note

## 2016-09-13 ENCOUNTER — Ambulatory Visit
Admission: RE | Admit: 2016-09-13 | Discharge: 2016-09-13 | Disposition: A | Discharge status: Home / Self Care | Payer: Medicare Other | Source: Ambulatory Visit | Attending: Radiation Oncology | Admitting: Radiation Oncology

## 2016-09-13 DIAGNOSIS — Z510 Encounter for antineoplastic radiation therapy: Secondary | ICD-10-CM | POA: Diagnosis not present

## 2016-09-14 ENCOUNTER — Other Ambulatory Visit (HOSPITAL_BASED_OUTPATIENT_CLINIC_OR_DEPARTMENT_OTHER): Payer: Medicare Other

## 2016-09-14 ENCOUNTER — Ambulatory Visit
Admission: RE | Admit: 2016-09-14 | Discharge: 2016-09-14 | Disposition: A | Discharge status: Home / Self Care | Payer: Medicare Other | Source: Ambulatory Visit | Attending: Radiation Oncology | Admitting: Radiation Oncology

## 2016-09-14 DIAGNOSIS — C7801 Secondary malignant neoplasm of right lung: Secondary | ICD-10-CM

## 2016-09-14 DIAGNOSIS — C50311 Malignant neoplasm of lower-inner quadrant of right female breast: Secondary | ICD-10-CM

## 2016-09-14 DIAGNOSIS — C50811 Malignant neoplasm of overlapping sites of right female breast: Secondary | ICD-10-CM

## 2016-09-14 DIAGNOSIS — Z171 Estrogen receptor negative status [ER-]: Secondary | ICD-10-CM

## 2016-09-14 DIAGNOSIS — Z510 Encounter for antineoplastic radiation therapy: Secondary | ICD-10-CM | POA: Diagnosis not present

## 2016-09-14 LAB — COMPREHENSIVE METABOLIC PANEL
ALT: 12 U/L (ref 0–55)
AST: 18 U/L (ref 5–34)
Albumin: 3.7 g/dL (ref 3.5–5.0)
Alkaline Phosphatase: 74 U/L (ref 40–150)
Anion Gap: 9 mEq/L (ref 3–11)
BUN: 17.9 mg/dL (ref 7.0–26.0)
CALCIUM: 9.9 mg/dL (ref 8.4–10.4)
CHLORIDE: 104 meq/L (ref 98–109)
CO2: 28 mEq/L (ref 22–29)
Creatinine: 1.1 mg/dL (ref 0.6–1.1)
EGFR: 56 mL/min/{1.73_m2} — AB (ref >90)
GLUCOSE: 152 mg/dL — AB (ref 70–140)
POTASSIUM: 3.9 meq/L (ref 3.5–5.1)
SODIUM: 142 meq/L (ref 136–145)
Total Bilirubin: 0.42 mg/dL (ref 0.20–1.20)
Total Protein: 7.7 g/dL (ref 6.4–8.3)

## 2016-09-14 LAB — CBC WITH DIFFERENTIAL/PLATELET
BASO%: 0.6 % (ref 0.0–2.0)
BASOS ABS: 0 10*3/uL (ref 0.0–0.1)
EOS%: 5.4 % (ref 0.0–7.0)
Eosinophils Absolute: 0.3 10*3/uL (ref 0.0–0.5)
HCT: 34.7 % — ABNORMAL LOW (ref 34.8–46.6)
HGB: 11.1 g/dL — ABNORMAL LOW (ref 11.6–15.9)
LYMPH%: 29.4 % (ref 14.0–49.7)
MCH: 26.7 pg (ref 25.1–34.0)
MCHC: 32 g/dL (ref 31.5–36.0)
MCV: 83.6 fL (ref 79.5–101.0)
MONO#: 0.7 10*3/uL (ref 0.1–0.9)
MONO%: 14.7 % — AB (ref 0.0–14.0)
NEUT#: 2.3 10*3/uL (ref 1.5–6.5)
NEUT%: 49.9 % (ref 38.4–76.8)
Platelets: 207 10*3/uL (ref 145–400)
RBC: 4.15 10*6/uL (ref 3.70–5.45)
RDW: 13.5 % (ref 11.2–14.5)
WBC: 4.6 10*3/uL (ref 3.9–10.3)
lymph#: 1.4 10*3/uL (ref 0.9–3.3)

## 2016-09-15 ENCOUNTER — Encounter (HOSPITAL_COMMUNITY): Payer: Medicare Other

## 2016-09-15 ENCOUNTER — Ambulatory Visit
Admission: RE | Admit: 2016-09-15 | Discharge: 2016-09-15 | Disposition: A | Discharge status: Home / Self Care | Payer: Medicare Other | Source: Ambulatory Visit | Attending: Radiation Oncology | Admitting: Radiation Oncology

## 2016-09-15 DIAGNOSIS — Z510 Encounter for antineoplastic radiation therapy: Secondary | ICD-10-CM | POA: Diagnosis not present

## 2016-09-15 LAB — CANCER ANTIGEN 27.29: CAN 27.29: 26.8 U/mL (ref 0.0–38.6)

## 2016-09-16 ENCOUNTER — Ambulatory Visit
Admission: RE | Admit: 2016-09-16 | Discharge: 2016-09-16 | Disposition: A | Discharge status: Home / Self Care | Payer: Medicare Other | Source: Ambulatory Visit | Attending: Radiation Oncology | Admitting: Radiation Oncology

## 2016-09-16 DIAGNOSIS — Z510 Encounter for antineoplastic radiation therapy: Secondary | ICD-10-CM | POA: Diagnosis not present

## 2016-09-17 ENCOUNTER — Encounter: Payer: Self-pay | Admitting: Radiation Oncology

## 2016-09-17 ENCOUNTER — Ambulatory Visit
Admission: RE | Admit: 2016-09-17 | Discharge: 2016-09-17 | Disposition: A | Discharge status: Home / Self Care | Source: Ambulatory Visit | Attending: Radiation Oncology | Admitting: Radiation Oncology

## 2016-09-17 ENCOUNTER — Ambulatory Visit
Admission: RE | Admit: 2016-09-17 | Discharge: 2016-09-17 | Disposition: A | Discharge status: Home / Self Care | Payer: Medicare Other | Source: Ambulatory Visit | Attending: Radiation Oncology | Admitting: Radiation Oncology

## 2016-09-17 VITALS — BP 154/57 | HR 68 | Temp 98.1°F | Resp 16 | Ht 62.5 in | Wt 154.6 lb

## 2016-09-17 DIAGNOSIS — Z171 Estrogen receptor negative status [ER-]: Principal | ICD-10-CM

## 2016-09-17 DIAGNOSIS — C50311 Malignant neoplasm of lower-inner quadrant of right female breast: Secondary | ICD-10-CM

## 2016-09-17 DIAGNOSIS — Z510 Encounter for antineoplastic radiation therapy: Secondary | ICD-10-CM | POA: Diagnosis not present

## 2016-09-17 NOTE — Progress Notes (Signed)
Weekly rad txcs left breast 19/33 completed, mild skin tanning to breast, hyperpigmentation under the axilla , skin intact, no pain, appetite good, using radiaplex as instructed. Wt Readings from Last 3 Encounters:  09/17/16 154 lb 9.6 oz (70.1 kg)  09/10/16 153 lb 6.4 oz (69.6 kg)  09/02/16 157 lb 3.2 oz (71.3 kg)  BP (!) 154/57   Pulse 68   Temp 98.1 F (36.7 C) (Oral)   Resp 16   Ht 5' 2.5" (1.588 m)   Wt 154 lb 9.6 oz (70.1 kg)   SpO2 100%   BMI 27.83 kg/m

## 2016-09-17 NOTE — Progress Notes (Signed)
Department of Radiation Oncology  Phone:  [phone] Fax:        [phone]  Weekly Treatment Note    Name: Jennifer Garrett Date: 09/17/2016 MRN: [id number] DOB: 01-May-1932   Diagnosis:     ICD-9-CM ICD-10-CM   1. Malignant neoplasm of lower-inner quadrant of right breast of female, estrogen receptor negative ([place]) 174.3 C50.311    V86.1 Z17.1      Current dose: 34.2 Gy  Current fraction: 19   MEDICATIONS: Current Outpatient Prescriptions  Medication Sig Dispense Refill  . aspirin EC 81 MG tablet Take 1 tablet by mouth every morning.    . Blood Glucose Monitoring Suppl (ACCU-CHEK AVIVA PLUS) w/Device KIT 1 Device by Does not apply route 2 (two) times daily. 1 kit 0  . calcium carbonate (OS-CAL) 600 MG TABS Take 600 mg by mouth 2 (two) times daily with a meal.    . diclofenac sodium (VOLTAREN) 1 % GEL Apply 1 application topically every other day.    . donepezil (ARICEPT) 5 MG tablet Take 5 mg by mouth at bedtime.    . ferrous sulfate 325 (65 FE) MG tablet Take 1 tablet (325 mg total) by mouth 2 (two) times daily with a meal. 60 tablet 3  . glipiZIDE (GLUCOTROL) 10 MG tablet Take 10 mg by mouth daily as needed (for high blood sugar (greater than 150)).     Marland Kitchen glucose blood (ACCU-CHEK AVIVA) test strip Use as instructed 100 each 12  . hyaluronate sodium (RADIAPLEXRX) GEL Apply 1 application topically.    . hydrochlorothiazide (MICROZIDE) 12.5 MG capsule Take 12.5 mg by mouth daily.    Marland Kitchen KLOR-CON M20 20 MEQ tablet Take 20 mEq by mouth daily.    . Lancets (ACCU-CHEK SOFT TOUCH) lancets Use as instructed 100 each 12  . non-metallic deodorant (ALRA) MISC Apply 1 application topically daily as needed.    Marland Kitchen olmesartan (BENICAR) 40 MG tablet Take 40 mg by mouth daily.    Marland Kitchen albuterol (PROVENTIL HFA;VENTOLIN HFA) 108 (90 BASE) MCG/ACT inhaler Inhale 1 puff into the lungs every 6 (six) hours as needed for wheezing or shortness of breath. Reported on 02/04/2016    . atorvastatin  (LIPITOR) 40 MG tablet Take 1 tablet (40 mg total) by mouth daily. (Patient taking differently: Take 40 mg by mouth daily at 6 PM. ) 90 tablet 3  . naproxen sodium (ANAPROX) 220 MG tablet Take 220 mg by mouth 2 (two) times daily as needed (for pain.).     No current facility-administered medications for this encounter.      ALLERGIES: Lyrica [pregabalin]; Feldene [piroxicam]; Lisinopril; Orudis [ketoprofen]; and Vibramycin [doxycycline calcium]   LABORATORY DATA:  Lab Results  Component Value Date   WBC 4.6 09/14/2016   HGB 11.1 (L) 09/14/2016   HCT 34.7 (L) 09/14/2016   MCV 83.6 09/14/2016   PLT 207 09/14/2016   Lab Results  Component Value Date   NA 142 09/14/2016   K 3.9 09/14/2016   CL 104 05/27/2016   CO2 28 09/14/2016   Lab Results  Component Value Date   ALT 12 09/14/2016   AST 18 09/14/2016   ALKPHOS 74 09/14/2016   BILITOT 0.42 09/14/2016     NARRATIVE: MALIAKA BRASINGTON was seen today for weekly treatment management. The chart was checked and the patient's films were reviewed.  The patient is she denies any significant skin change.  Weekly rad txcs left breast 19/33 completed, mild skin tanning to breast, hyperpigmentation under the  axilla , skin intact, no pain, appetite good, using radiaplex as instructed. Wt Readings from Last 3 Encounters:  09/17/16 154 lb 9.6 oz (70.1 kg)  09/10/16 153 lb 6.4 oz (69.6 kg)  09/02/16 157 lb 3.2 oz (71.3 kg)  BP (!) 154/57   Pulse 68   Temp 98.1 F (36.7 C) (Oral)   Resp 16   Ht 5' 2.5" (1.588 m)   Wt 154 lb 9.6 oz (70.1 kg)   SpO2 100%   BMI 27.83 kg/m   PHYSICAL EXAMINATION: height is 5' 2.5" (1.588 m) and weight is 154 lb 9.6 oz (70.1 kg). Her oral temperature is 98.1 F (36.7 C). Her blood pressure is 154/57 (abnormal) and her pulse is 68. Her respiration is 16 and oxygen saturation is 100%.      Hyperpigmentation in the treatment area. No desquamation.  ASSESSMENT: The patient is doing satisfactorily with  treatment.  PLAN: We will continue with the patient's radiation treatment as planned.

## 2016-09-20 ENCOUNTER — Ambulatory Visit
Admission: RE | Admit: 2016-09-20 | Discharge: 2016-09-20 | Disposition: A | Discharge status: Home / Self Care | Payer: Medicare Other | Source: Ambulatory Visit | Attending: Radiation Oncology | Admitting: Radiation Oncology

## 2016-09-20 DIAGNOSIS — Z510 Encounter for antineoplastic radiation therapy: Secondary | ICD-10-CM | POA: Diagnosis not present

## 2016-09-21 ENCOUNTER — Ambulatory Visit
Admission: RE | Admit: 2016-09-21 | Discharge: 2016-09-21 | Disposition: A | Discharge status: Home / Self Care | Payer: Medicare Other | Source: Ambulatory Visit | Attending: Radiation Oncology | Admitting: Radiation Oncology

## 2016-09-21 ENCOUNTER — Encounter: Payer: Self-pay | Admitting: Oncology

## 2016-09-21 ENCOUNTER — Ambulatory Visit: Payer: Medicare Other | Admitting: Oncology

## 2016-09-21 DIAGNOSIS — Z510 Encounter for antineoplastic radiation therapy: Secondary | ICD-10-CM | POA: Diagnosis not present

## 2016-09-22 ENCOUNTER — Encounter: Payer: Self-pay | Admitting: Radiation Oncology

## 2016-09-22 ENCOUNTER — Ambulatory Visit
Admission: RE | Admit: 2016-09-22 | Discharge: 2016-09-22 | Disposition: A | Discharge status: Home / Self Care | Payer: Medicare Other | Source: Ambulatory Visit | Attending: Radiation Oncology | Admitting: Radiation Oncology

## 2016-09-22 DIAGNOSIS — Z510 Encounter for antineoplastic radiation therapy: Secondary | ICD-10-CM | POA: Diagnosis not present

## 2016-09-23 ENCOUNTER — Ambulatory Visit
Admission: RE | Admit: 2016-09-23 | Discharge: 2016-09-23 | Disposition: A | Discharge status: Home / Self Care | Payer: Medicare Other | Source: Ambulatory Visit | Attending: Radiation Oncology | Admitting: Radiation Oncology

## 2016-09-23 ENCOUNTER — Ambulatory Visit
Admission: RE | Admit: 2016-09-23 | Discharge: 2016-09-23 | Disposition: A | Discharge status: Home / Self Care | Source: Ambulatory Visit | Attending: Radiation Oncology | Admitting: Radiation Oncology

## 2016-09-23 DIAGNOSIS — C50311 Malignant neoplasm of lower-inner quadrant of right female breast: Secondary | ICD-10-CM

## 2016-09-23 DIAGNOSIS — Z171 Estrogen receptor negative status [ER-]: Principal | ICD-10-CM

## 2016-09-23 DIAGNOSIS — Z510 Encounter for antineoplastic radiation therapy: Secondary | ICD-10-CM | POA: Diagnosis not present

## 2016-09-23 NOTE — Progress Notes (Signed)
  Radiation Oncology         [phone]   Name: Jennifer Garrett MRN: [id number]   Date: 09/23/2016  DOB: 1931/09/06   Weekly Radiation Therapy Management    ICD-9-CM ICD-10-CM   1. Malignant neoplasm of lower-inner quadrant of right breast of female, estrogen receptor negative (HCC) 174.3 C50.311    V86.1 Z17.1     Current Dose: 41.4 Gy  Planned Dose:  60.4 Gy  Narrative The patient presents for routine under treatment assessment.  23/33 fractions completed to the right chest wall. She states she is fine so far. She denies itching or skin breakdown in the treatment area.  Set-up films were reviewed. The chart was checked.  Physical Findings  vitals were not taken for this visit.. Weight essentially stable.  Hyperpigmentation of the right chest wall and supraclavicular region without desquamation.  Impression The patient is tolerating radiation.  Plan Continue treatment as planned.         Sheral Apley Tammi Klippel, M.D.  This document serves as a record of services personally performed by Shona Simpson, PA-C and Tyler Pita, MD. It was created on their behalf by Darcus Austin, a trained medical scribe. The creation of this record is based on the scribe's personal observations and the providers' statements to them. This document has been checked and approved by the attending provider.

## 2016-09-24 ENCOUNTER — Ambulatory Visit
Admission: RE | Admit: 2016-09-24 | Discharge: 2016-09-24 | Disposition: A | Discharge status: Home / Self Care | Payer: Medicare Other | Source: Ambulatory Visit | Attending: Radiation Oncology | Admitting: Radiation Oncology

## 2016-09-24 ENCOUNTER — Encounter: Payer: Self-pay | Admitting: Radiation Oncology

## 2016-09-24 DIAGNOSIS — Z510 Encounter for antineoplastic radiation therapy: Secondary | ICD-10-CM | POA: Diagnosis not present

## 2016-09-27 ENCOUNTER — Ambulatory Visit
Admission: RE | Admit: 2016-09-27 | Discharge: 2016-09-27 | Disposition: A | Discharge status: Home / Self Care | Payer: Medicare Other | Source: Ambulatory Visit | Attending: Radiation Oncology | Admitting: Radiation Oncology

## 2016-09-27 DIAGNOSIS — Z510 Encounter for antineoplastic radiation therapy: Secondary | ICD-10-CM | POA: Diagnosis not present

## 2016-09-28 ENCOUNTER — Ambulatory Visit
Admission: RE | Admit: 2016-09-28 | Discharge: 2016-09-28 | Disposition: A | Discharge status: Home / Self Care | Payer: Medicare Other | Source: Ambulatory Visit | Attending: Radiation Oncology | Admitting: Radiation Oncology

## 2016-09-28 DIAGNOSIS — Z510 Encounter for antineoplastic radiation therapy: Secondary | ICD-10-CM | POA: Diagnosis not present

## 2016-09-29 ENCOUNTER — Ambulatory Visit
Admission: RE | Admit: 2016-09-29 | Discharge: 2016-09-29 | Disposition: A | Discharge status: Home / Self Care | Payer: Medicare Other | Source: Ambulatory Visit | Attending: Radiation Oncology | Admitting: Radiation Oncology

## 2016-09-29 DIAGNOSIS — Z510 Encounter for antineoplastic radiation therapy: Secondary | ICD-10-CM | POA: Diagnosis not present

## 2016-09-30 ENCOUNTER — Ambulatory Visit
Admission: RE | Admit: 2016-09-30 | Discharge: 2016-09-30 | Disposition: A | Discharge status: Home / Self Care | Payer: Medicare Other | Source: Ambulatory Visit | Attending: Radiation Oncology | Admitting: Radiation Oncology

## 2016-09-30 ENCOUNTER — Encounter: Payer: Self-pay | Admitting: Radiation Oncology

## 2016-09-30 DIAGNOSIS — Z510 Encounter for antineoplastic radiation therapy: Secondary | ICD-10-CM | POA: Diagnosis not present

## 2016-10-01 ENCOUNTER — Ambulatory Visit
Admission: RE | Admit: 2016-10-01 | Discharge: 2016-10-01 | Disposition: A | Discharge status: Home / Self Care | Payer: Medicare Other | Source: Ambulatory Visit | Attending: Radiation Oncology | Admitting: Radiation Oncology

## 2016-10-01 DIAGNOSIS — Z510 Encounter for antineoplastic radiation therapy: Secondary | ICD-10-CM | POA: Diagnosis not present

## 2016-10-01 NOTE — Progress Notes (Signed)
  Radiation Oncology         [phone] ________________________________  Name: Jennifer Garrett MRN: [id number]  Date: 09/30/2016  DOB: 12-05-1931  Complex simulation note  The patient has undergone complex simulation for her upcoming boost treatment for her diagnosis of right-sided breast cancer. The patient has initially been planned to receive 50.4 Gy. The patient will now receive a 10 Gy boost to the seroma cavity which has been contoured. This will be accomplished using an en face electron field. Based on the depth of the target area, 6 MeV electrons will be used. The patient's final total dose therefore will be 60.4 Gy. A complex isodose plan from the electron [facility] Carlo calculation is requested for the boost treatment.   _______________________________  [name], MD, PhD

## 2016-10-04 ENCOUNTER — Ambulatory Visit
Admission: RE | Admit: 2016-10-04 | Discharge: 2016-10-04 | Disposition: A | Discharge status: Home / Self Care | Payer: Medicare Other | Source: Ambulatory Visit | Attending: Radiation Oncology | Admitting: Radiation Oncology

## 2016-10-04 DIAGNOSIS — Z510 Encounter for antineoplastic radiation therapy: Secondary | ICD-10-CM | POA: Diagnosis not present

## 2016-10-05 ENCOUNTER — Ambulatory Visit
Admission: RE | Admit: 2016-10-05 | Discharge: 2016-10-05 | Disposition: A | Discharge status: Home / Self Care | Payer: Medicare Other | Source: Ambulatory Visit | Attending: Radiation Oncology | Admitting: Radiation Oncology

## 2016-10-05 DIAGNOSIS — Z510 Encounter for antineoplastic radiation therapy: Secondary | ICD-10-CM | POA: Diagnosis not present

## 2016-10-06 ENCOUNTER — Ambulatory Visit
Admission: RE | Admit: 2016-10-06 | Discharge: 2016-10-06 | Disposition: A | Discharge status: Home / Self Care | Payer: Medicare Other | Source: Ambulatory Visit | Attending: Radiation Oncology | Admitting: Radiation Oncology

## 2016-10-06 DIAGNOSIS — Z510 Encounter for antineoplastic radiation therapy: Secondary | ICD-10-CM | POA: Diagnosis not present

## 2016-10-07 ENCOUNTER — Encounter: Payer: Self-pay | Admitting: Radiation Oncology

## 2016-10-07 ENCOUNTER — Ambulatory Visit
Admission: RE | Admit: 2016-10-07 | Discharge: 2016-10-07 | Disposition: A | Discharge status: Home / Self Care | Payer: Medicare Other | Source: Ambulatory Visit | Attending: Radiation Oncology | Admitting: Radiation Oncology

## 2016-10-07 DIAGNOSIS — Z510 Encounter for antineoplastic radiation therapy: Secondary | ICD-10-CM | POA: Diagnosis not present

## 2016-10-07 NOTE — Progress Notes (Signed)
  Radiation Oncology         [phone] ________________________________  Name: Jennifer Garrett MRN: [id number]  Date: 10/07/2016  DOB: [birth year]  End of Treatment Note  Diagnosis: Triple negative, invasive ductal carcinoma of the lower outer quadrant of the right breast     Indication for treatment:  Curative       Radiation treatment dates:   08/24/16- 10/07/16  Site/dose:   1) Right chest wall / 50.4 Gy in 28 fractions   2) Right chest wall boost / 10 Gy in 5 fractions  Beams/energy:  1) 3D/ 10X, 15X, 6X    2) Depth dose/ 6 MeV  Narrative: The patient tolerated radiation treatment relatively well. Patient denied any symptoms such as itching or skin breakdown in the treatment area. She presented with hyperpigmentation of the right chest wall and supraclavicular region without desquamation.  Plan: The patient has completed radiation treatment. The patient will return to radiation oncology clinic for routine followup in one month. I advised them to call or return sooner if they have any questions or concerns related to their recovery or treatment.  ------------------------------------------------  [name], MD, PhD  This document serves as a record of services personally performed by Kyung Rudd, MD. It was created on his behalf by Bethann Humble, a trained medical scribe. The creation of this record is based on the scribe's personal observations and the provider's statements to them. This document has been checked and approved by the attending provider.

## 2016-11-02 ENCOUNTER — Telehealth: Payer: Self-pay | Admitting: *Deleted

## 2016-11-02 NOTE — Telephone Encounter (Signed)
Called patient to alter fu on 11-18-16 to 11-16-16 @ 9 am, lvm for a return call

## 2016-11-15 NOTE — Progress Notes (Signed)
Jennifer Garrett 83 y.o. woman with Triple negative, invasive ductal carcinoma of the lower outer quadrant of the right breast radiation completed 10-07-16, one month FU.    Skin status:RCW with hyperpigmentation and axilla skin intact.  Reports soreness to the middle chest at times. What lotion are you using? Using lotion with vitamin E. Have you seen med onc? If not, when is appointment:06-23-16 Dr Jana Hakim, daughter to check with office about an appointment If they are ER+, have they started Al or Tamoxifen? If not, why? Triple negative Discuss survivorship appointment:Not scheduled Have you had a mammogram scheduled? Not scheduled yet Offer referral to [facility]. Will receive at the survivorship appointment. Appetite:Good eating three meals per day Pain:None Fatigue:Energy level has improved;still has fatigue at times. Arm mobility:Reports not having any difficulty raising her right arm. Lymphedema:No Wt Readings from Last 3 Encounters:  11/16/16 152 lb (68.9 kg)  09/17/16 154 lb 9.6 oz (70.1 kg)  09/10/16 153 lb 6.4 oz (69.6 kg)  BP (!) 142/86   Pulse 61   Temp 98.3 F (36.8 C) (Oral)   Resp 16   Ht 5' 2.5" (1.588 m)   Wt 152 lb (68.9 kg)   SpO2 97%   BMI 27.36 kg/m

## 2016-11-16 ENCOUNTER — Encounter: Payer: Self-pay | Admitting: Radiation Oncology

## 2016-11-16 ENCOUNTER — Ambulatory Visit
Admission: RE | Admit: 2016-11-16 | Discharge: 2016-11-16 | Disposition: A | Discharge status: Home / Self Care | Payer: Medicare Other | Source: Ambulatory Visit | Attending: Radiation Oncology | Admitting: Radiation Oncology

## 2016-11-16 ENCOUNTER — Encounter: Payer: Self-pay | Admitting: Podiatry

## 2016-11-16 ENCOUNTER — Ambulatory Visit (INDEPENDENT_AMBULATORY_CARE_PROVIDER_SITE_OTHER): Payer: Medicare Other | Admitting: Podiatry

## 2016-11-16 VITALS — BP 142/86 | HR 61 | Temp 98.3°F | Resp 16 | Ht 62.5 in | Wt 152.0 lb

## 2016-11-16 DIAGNOSIS — M2010 Hallux valgus (acquired), unspecified foot: Secondary | ICD-10-CM

## 2016-11-16 DIAGNOSIS — C50311 Malignant neoplasm of lower-inner quadrant of right female breast: Secondary | ICD-10-CM | POA: Insufficient documentation

## 2016-11-16 DIAGNOSIS — B351 Tinea unguium: Secondary | ICD-10-CM | POA: Diagnosis not present

## 2016-11-16 DIAGNOSIS — M79609 Pain in unspecified limb: Secondary | ICD-10-CM | POA: Diagnosis not present

## 2016-11-16 DIAGNOSIS — Z171 Estrogen receptor negative status [ER-]: Secondary | ICD-10-CM | POA: Diagnosis not present

## 2016-11-16 DIAGNOSIS — E1140 Type 2 diabetes mellitus with diabetic neuropathy, unspecified: Secondary | ICD-10-CM

## 2016-11-16 DIAGNOSIS — Z510 Encounter for antineoplastic radiation therapy: Secondary | ICD-10-CM | POA: Diagnosis present

## 2016-11-16 NOTE — Progress Notes (Signed)
Radiation Oncology         [phone] ________________________________  Name: Jennifer Garrett MRN: [id number]  Date: 11/16/2016  DOB: 1931/11/29  Post Treatment Note  CC: Philis Fendt, MD  Fanny Skates, MD  Diagnosis:   Stage IV T4, N1, M1 grade 3 triple negative, invasive ductal carcinoma of the right breast.  Interval Since Last Radiation:  5 weeks   08/24/16- 10/07/16: 1.  Right chest wall / 50.4 Gy in 28 fractions 2.   Right chest wall boost / 10 Gy in 5 fractions  Narrative:  The patient returns today for routine follow-up. During treatment she did very well with radiotherapy and did not have significant desquamation.                             On review of systems, the patient states she's feeling pretty well overall. She occasionally has soreness at the site of her mastectomy scar toward the middle of her chest. She denies any skin concerns, fevers, chills, chest pain, or shortness of breath. No other complaints are verbalized.  ALLERGIES:  is allergic to lyrica [pregabalin]; feldene [piroxicam]; lisinopril; orudis [ketoprofen]; and vibramycin [doxycycline calcium].  Meds: Current Outpatient Prescriptions  Medication Sig Dispense Refill  . aspirin EC 81 MG tablet Take 1 tablet by mouth every morning.    . Blood Glucose Monitoring Suppl (ACCU-CHEK AVIVA PLUS) w/Device KIT 1 Device by Does not apply route 2 (two) times daily. 1 kit 0  . calcium carbonate (OS-CAL) 600 MG TABS Take 600 mg by mouth 2 (two) times daily with a meal.    . diclofenac sodium (VOLTAREN) 1 % GEL Apply 1 application topically every other day.    Marland Kitchen glipiZIDE (GLUCOTROL) 10 MG tablet Take 10 mg by mouth daily as needed (for high blood sugar (greater than 150)).     Marland Kitchen glucose blood (ACCU-CHEK AVIVA) test strip Use as instructed 100 each 12  . hydrochlorothiazide (MICROZIDE) 12.5 MG capsule Take 12.5 mg by mouth daily.    Marland Kitchen KLOR-CON M20 20 MEQ tablet Take 20 mEq by mouth daily.    . Lancets  (ACCU-CHEK SOFT TOUCH) lancets Use as instructed 100 each 12  . olmesartan (BENICAR) 40 MG tablet Take 40 mg by mouth daily.    Marland Kitchen albuterol (PROVENTIL HFA;VENTOLIN HFA) 108 (90 BASE) MCG/ACT inhaler Inhale 1 puff into the lungs every 6 (six) hours as needed for wheezing or shortness of breath. Reported on 02/04/2016    . atorvastatin (LIPITOR) 40 MG tablet Take 1 tablet (40 mg total) by mouth daily. (Patient taking differently: Take 40 mg by mouth daily at 6 PM. ) 90 tablet 3  . donepezil (ARICEPT) 5 MG tablet Take 5 mg by mouth at bedtime.    . ferrous sulfate 325 (65 FE) MG tablet Take 1 tablet (325 mg total) by mouth 2 (two) times daily with a meal. (Patient not taking: Reported on 11/16/2016) 60 tablet 3  . naproxen sodium (ANAPROX) 220 MG tablet Take 220 mg by mouth 2 (two) times daily as needed (for pain.).     No current facility-administered medications for this encounter.     Physical Findings:  height is 5' 2.5" (1.588 m) and weight is 152 lb (68.9 kg). Her oral temperature is 98.3 F (36.8 C). Her blood pressure is 142/86 (abnormal) and her pulse is 61. Her respiration is 16 and oxygen saturation is 97%.  Pain Assessment Pain Score:  0-No pain/10 In general this is a well appearing African American female in no acute distress. She's alert and oriented x4 and appropriate throughout the examination. Cardiopulmonary assessment is negative for acute distress and she exhibits normal effort. The right chest wall incision site is intact with no evidence of desquamation. There is moderate hyperpigmentation noted of the chest wall.  Lab Findings: Lab Results  Component Value Date   WBC 4.6 09/14/2016   HGB 11.1 (L) 09/14/2016   HCT 34.7 (L) 09/14/2016   MCV 83.6 09/14/2016   PLT 207 09/14/2016     Radiographic Findings: No results found.  Impression/Plan: 1. Stage IV T4, N1, M1 grade 3 triple negative, invasive ductal carcinoma of the right breast.. The patient has been doing well  since completion of radiotherapy. We discussed that we would be happy to continue to follow her as needed, but she will also continue to follow up with Dr. Jana Hakim in medical oncology. She was counseled on skin care as well as measures to avoid sun exposure to this area.  2. Survivorship. She will be set up for survivorship when medically appropriate. She was given information for activities of survivorship at the cancer center if she is interested.     Carola Rhine, PAC

## 2016-11-16 NOTE — Progress Notes (Signed)
Patient ID: FEMALE IAFRATE, female   DOB: 11/10/1931, 83 y.o.   MRN: [id number] Complaint:  Visit Type: Patient returns to my office for continued preventative foot care services. Complaint: Patient states" my nails have grown long and thick and become painful to walk and wear shoes" Patient has been diagnosed with DM with neuropathy.. The patient presents for preventative foot care services. No changes to ROS  Podiatric Exam: Vascular: dorsalis pedis and posterior tibial pulses are palpable bilateral. Capillary return is immediate. Temperature gradient is WNL. Skin turgor WNL  Sensorium: Decreased  Semmes Weinstein monofilament test. Normal tactile sensation bilaterally. Nail Exam: Pt has thick disfigured discolored nails with subungual debris noted bilateral entire nail hallux through fifth toenails Ulcer Exam: There is no evidence of ulcer or pre-ulcerative changes or infection. Orthopedic Exam: Muscle tone and strength are WNL. No limitations in general ROM. No crepitus or effusions noted. HAV  B/L. Skin: No Porokeratosis. No infection or ulcers  Diagnosis:  Onychomycosis, ,Diabetes with neuropathy.  HAV  B/L.  Treatment & Plan Procedures and Treatment: Consent by patient was obtained for treatment procedures. The patient understood the discussion of treatment and procedures well. All questions were answered thoroughly reviewed. Debridement of mycotic and hypertrophic toenails, 1 through 5 bilateral and clearing of subungual debris. No ulceration, no infection noted.  Initiate diabetic footgear paperwork for DPN and HAV  B/L. Return Visit-Office Procedure: Patient instructed to return to the office for a follow up visit 3 months for continued evaluation and treatment.    Gardiner Barefoot DPM

## 2016-11-18 ENCOUNTER — Ambulatory Visit: Payer: Self-pay | Admitting: Radiation Oncology

## 2016-12-03 ENCOUNTER — Telehealth: Payer: Self-pay | Admitting: Oncology

## 2016-12-03 NOTE — Telephone Encounter (Signed)
Spoke with dtr re f/u 5/23 @ 4:30 pm. Per 5/18 schedule message next available asap. 8:30am or 430pm ok. Spoke with dtr and patient not able to come 5/21 @ 8:30am - next available 5/23 @ 4:30pm.

## 2016-12-07 ENCOUNTER — Telehealth: Payer: Self-pay | Admitting: Oncology

## 2016-12-07 NOTE — Telephone Encounter (Signed)
Per desk nurse rescheduled 5/23 f/u. Spoke with dtr re new f/u date/time for 6/11.

## 2016-12-08 ENCOUNTER — Ambulatory Visit: Admitting: Oncology

## 2016-12-13 ENCOUNTER — Emergency Department (HOSPITAL_COMMUNITY)
Admission: EM | Admit: 2016-12-13 | Discharge: 2016-12-14 | Disposition: A | Discharge status: Home / Self Care | Payer: Medicare Other | Attending: Emergency Medicine | Admitting: Emergency Medicine

## 2016-12-13 ENCOUNTER — Emergency Department (HOSPITAL_COMMUNITY): Payer: Medicare Other

## 2016-12-13 ENCOUNTER — Encounter (HOSPITAL_COMMUNITY): Payer: Self-pay

## 2016-12-13 DIAGNOSIS — Z7984 Long term (current) use of oral hypoglycemic drugs: Secondary | ICD-10-CM | POA: Diagnosis not present

## 2016-12-13 DIAGNOSIS — R079 Chest pain, unspecified: Secondary | ICD-10-CM | POA: Diagnosis present

## 2016-12-13 DIAGNOSIS — Z85118 Personal history of other malignant neoplasm of bronchus and lung: Secondary | ICD-10-CM | POA: Insufficient documentation

## 2016-12-13 DIAGNOSIS — I2510 Atherosclerotic heart disease of native coronary artery without angina pectoris: Secondary | ICD-10-CM | POA: Diagnosis not present

## 2016-12-13 DIAGNOSIS — Z87891 Personal history of nicotine dependence: Secondary | ICD-10-CM | POA: Diagnosis not present

## 2016-12-13 DIAGNOSIS — Z853 Personal history of malignant neoplasm of breast: Secondary | ICD-10-CM | POA: Diagnosis not present

## 2016-12-13 DIAGNOSIS — I10 Essential (primary) hypertension: Secondary | ICD-10-CM | POA: Diagnosis not present

## 2016-12-13 DIAGNOSIS — M546 Pain in thoracic spine: Secondary | ICD-10-CM | POA: Diagnosis not present

## 2016-12-13 DIAGNOSIS — R0789 Other chest pain: Secondary | ICD-10-CM | POA: Diagnosis not present

## 2016-12-13 DIAGNOSIS — E119 Type 2 diabetes mellitus without complications: Secondary | ICD-10-CM | POA: Insufficient documentation

## 2016-12-13 DIAGNOSIS — Z7982 Long term (current) use of aspirin: Secondary | ICD-10-CM | POA: Diagnosis not present

## 2016-12-13 DIAGNOSIS — J45909 Unspecified asthma, uncomplicated: Secondary | ICD-10-CM | POA: Insufficient documentation

## 2016-12-13 LAB — CBC WITH DIFFERENTIAL/PLATELET
Basophils Absolute: 0 10*3/uL (ref 0.0–0.1)
Basophils Relative: 0 %
EOS PCT: 3 %
Eosinophils Absolute: 0.2 10*3/uL (ref 0.0–0.7)
HEMATOCRIT: 32 % — AB (ref 36.0–46.0)
Hemoglobin: 10.2 g/dL — ABNORMAL LOW (ref 12.0–15.0)
LYMPHS PCT: 31 %
Lymphs Abs: 1.6 10*3/uL (ref 0.7–4.0)
MCH: 27 pg (ref 26.0–34.0)
MCHC: 31.9 g/dL (ref 30.0–36.0)
MCV: 84.7 fL (ref 78.0–100.0)
MONO ABS: 0.5 10*3/uL (ref 0.1–1.0)
MONOS PCT: 10 %
NEUTROS ABS: 3 10*3/uL (ref 1.7–7.7)
Neutrophils Relative %: 56 %
PLATELETS: 223 10*3/uL (ref 150–400)
RBC: 3.78 MIL/uL — ABNORMAL LOW (ref 3.87–5.11)
RDW: 13.1 % (ref 11.5–15.5)
WBC: 5.3 10*3/uL (ref 4.0–10.5)

## 2016-12-13 LAB — BASIC METABOLIC PANEL
Anion gap: 7 (ref 5–15)
BUN: 9 mg/dL (ref 6–20)
CO2: 25 mmol/L (ref 22–32)
Calcium: 9.3 mg/dL (ref 8.9–10.3)
Chloride: 105 mmol/L (ref 101–111)
Creatinine, Ser: 0.82 mg/dL (ref 0.44–1.00)
GFR calc Af Amer: >60 mL/min (ref >60)
GFR calc non Af Amer: >60 mL/min (ref >60)
GLUCOSE: 115 mg/dL — AB (ref 65–99)
POTASSIUM: 3.5 mmol/L (ref 3.5–5.1)
Sodium: 137 mmol/L (ref 135–145)

## 2016-12-13 LAB — I-STAT TROPONIN, ED: Troponin i, poc: 0.01 ng/mL (ref 0.00–0.08)

## 2016-12-13 MED ORDER — HYDROCODONE-ACETAMINOPHEN 5-325 MG PO TABS
1.0000 | ORAL_TABLET | Freq: Once | ORAL | Status: AC
  Administered 2016-12-14: 1 via ORAL
  Filled 2016-12-13: qty 1

## 2016-12-13 NOTE — ED Provider Notes (Signed)
[facility] DEPT Provider Note   CSN: [id number] Arrival date & time: 12/13/16  1923     History   Chief Complaint Chief Complaint  Patient presents with  . Shortness of Breath  . Breast Pain    HPI Jennifer Garrett is a 83 y.o. female who presents with right sided chest pain. PMH significant for CAD, breast cancer s/p right mastectomy, HTN, HLD. The patient is somewhat of a poor historian and gives limited details. She states that earlier this evening she developed an acute onset of right sided chest pain while sitting at home. She states she ate a sausage earlier in the afternoon and is unsure if this is related. Nothing makes the pain better or worse. Not worse with exertion. It feels like a soreness. She has had right sided chest pain from her mastectomy in the past however she states that this feels different but cannot elaborate further. She denies fever, chills, palpitations, leg swelling, SOB, cough, abdominal pain, N/V.   HPI  Past Medical History:  Diagnosis Date  . Arthritis   . Asthma    rarely uses neb  . Breast cancer ([place]) 05/25/2016   right breast  . Breast cancer of lower-inner quadrant of right female breast ([place]) 01/22/2016  . Carpal tunnel syndrome   . Coronary artery disease    DES to OM3 90% stenosis 2003, 90% small RCA stenosis.    . Diabetes mellitus   . History of stomach ulcers   . Hyperlipemia   . Hypertension   . Irregular heart beat     Patient Active Problem List   Diagnosis Date Noted  . Lung metastases ([place]) 06/23/2016  . Breast cancer of lower-inner quadrant of right female breast ([place]) 01/22/2016    Past Surgical History:  Procedure Laterality Date  . ABDOMINAL HYSTERECTOMY    . CARDIAC CATHETERIZATION  2003   stent-  . CARPAL TUNNEL RELEASE  03/29/2012   Procedure: CARPAL TUNNEL RELEASE;  Surgeon: Wynonia Sours, MD;  Location: [facility];  Service: Orthopedics;  Laterality: Left;  . EYE SURGERY     catracts  . LESION  EXCISION WITH COMPLEX REPAIR Right 05/25/2016   Procedure: complex repair of 25cm wound;  Surgeon: Irene Limbo, MD;  Location: [facility];  Service: Plastics;  Laterality: Right;  . MASTECTOMY MODIFIED RADICAL Right 05/25/2016   Procedure: RIGHT MASTECTOMY MODIFIED RADICAL;  Surgeon: Fanny Skates, MD;  Location: [facility];  Service: General;  Laterality: Right;  . SHOULDER ARTHROSCOPY     right and left  . TRIGGER FINGER RELEASE  03/29/2012   Procedure: RELEASE TRIGGER FINGER/A-1 PULLEY;  Surgeon: Wynonia Sours, MD;  Location: [facility];  Service: Orthopedics;  Laterality: Left;    OB History    No data available       Home Medications    Prior to Admission medications   Medication Sig Start Date End Date Taking? Authorizing Provider  albuterol (PROVENTIL HFA;VENTOLIN HFA) 108 (90 BASE) MCG/ACT inhaler Inhale 1 puff into the lungs every 6 (six) hours as needed for wheezing or shortness of breath. Reported on 02/04/2016    [provider]  aspirin EC 81 MG tablet Take 1 tablet by mouth every morning.    [provider]  atorvastatin (LIPITOR) 40 MG tablet Take 1 tablet (40 mg total) by mouth daily. Patient taking differently: Take 40 mg by mouth daily at 6 PM.  02/20/16 05/20/16  Minus Breeding, MD  Blood Glucose Monitoring  Suppl (ACCU-CHEK AVIVA PLUS) w/Device KIT 1 Device by Does not apply route 2 (two) times daily. 02/13/16   Blanchie Dessert, MD  calcium carbonate (OS-CAL) 600 MG TABS Take 600 mg by mouth 2 (two) times daily with a meal.    [provider]  diclofenac sodium (VOLTAREN) 1 % GEL Apply 1 application topically every other day. 01/08/13   [provider]  donepezil (ARICEPT) 5 MG tablet Take 5 mg by mouth at bedtime. 03/14/16   [provider]  ferrous sulfate 325 (65 FE) MG tablet Take 1 tablet (325 mg total) by mouth 2 (two) times daily with a meal. Patient not taking: Reported on 11/16/2016 05/27/16   Fanny Skates, MD    glipiZIDE (GLUCOTROL) 10 MG tablet Take 10 mg by mouth daily as needed (for high blood sugar (greater than 150)).     [provider]  glucose blood (ACCU-CHEK AVIVA) test strip Use as instructed 02/13/16   Blanchie Dessert, MD  hydrochlorothiazide (MICROZIDE) 12.5 MG capsule Take 12.5 mg by mouth daily. 04/17/16   [provider]  KLOR-CON M20 20 MEQ tablet Take 20 mEq by mouth daily. 06/15/14   [provider]  Lancets (ACCU-CHEK SOFT TOUCH) lancets Use as instructed 02/13/16   Blanchie Dessert, MD  naproxen sodium (ANAPROX) 220 MG tablet Take 220 mg by mouth 2 (two) times daily as needed (for pain.).    [provider]  olmesartan (BENICAR) 40 MG tablet Take 40 mg by mouth daily.    [provider]    Family History Family History  Problem Relation Age of Onset  . Heart disease Mother        No details     Social History Social History  Substance Use Topics  . Smoking status: Former Smoker    Quit date: 03/24/1978  . Smokeless tobacco: Never Used  . Alcohol use No     Allergies   Lyrica [pregabalin]; Feldene [piroxicam]; Lisinopril; Orudis [ketoprofen]; and Vibramycin [doxycycline calcium]   Review of Systems Review of Systems  Constitutional: Negative for chills and fever.  Respiratory: Negative for cough and shortness of breath.   Cardiovascular: Positive for chest pain. Negative for palpitations and leg swelling.  Gastrointestinal: Negative for abdominal pain, nausea and vomiting.  All other systems reviewed and are negative.    Physical Exam Updated Vital Signs BP (!) 177/68   Pulse 60   Temp 98.5 F (36.9 C) (Oral)   Resp 18   SpO2 96%   Physical Exam  Constitutional: She is oriented to person, place, and time. She appears well-developed and well-nourished. No distress.  HENT:  Head: Normocephalic and atraumatic.  Eyes: Conjunctivae are normal. Pupils are equal, round, and reactive to light. Right eye exhibits no  discharge. Left eye exhibits no discharge. No scleral icterus.  Neck: Normal range of motion.  Cardiovascular: Normal rate and regular rhythm.  Exam reveals no gallop and no friction rub.   No murmur heard. Pulmonary/Chest: Effort normal and breath sounds normal. No respiratory distress. She has no wheezes. She has no rales. She exhibits tenderness (diffuse right sided chest tenderness).  Abdominal: Soft. Bowel sounds are normal. She exhibits no distension and no mass. There is no tenderness. There is no rebound and no guarding. No hernia.  Musculoskeletal:  Right-mid back tenderness  No lower leg edema  Neurological: She is alert and oriented to person, place, and time.  Skin: Skin is warm and dry.  Psychiatric: She has a normal mood  and affect. Her behavior is normal.  Nursing note and vitals reviewed.    ED Treatments / Results  Labs (all labs ordered are listed, but only abnormal results are displayed) Labs Reviewed  BASIC METABOLIC PANEL - Abnormal; Notable for the following:       Result Value   Glucose, Bld 115 (*)    All other components within normal limits  CBC WITH DIFFERENTIAL/PLATELET - Abnormal; Notable for the following:    RBC 3.78 (*)    Hemoglobin 10.2 (*)    HCT 32.0 (*)    All other components within normal limits  I-STAT TROPOININ, ED    EKG  EKG Interpretation  Date/Time:  Monday Dec 13 2016 20:04:44 EDT Ventricular Rate:  61 PR Interval:  162 QRS Duration: 116 QT Interval:  416 QTC Calculation: 418 R Axis:   -51 Text Interpretation:  Normal sinus rhythm Left anterior fascicular block Left ventricular hypertrophy with QRS widening and repolarization abnormality Cannot rule out Septal infarct , age undetermined Abnormal ECG When compared with ECG of 05/25/2016, No significant change was found Confirmed by Delora Fuel ([id number]) on 12/13/2016 10:50:30 PM       Radiology Dg Chest 2 View  Result Date: 12/13/2016 CLINICAL DATA:  Dyspnea after eating  sausage this afternoon. Right-sided breast pain after breast removal 5 months ago. EXAM: CHEST  2 VIEW COMPARISON:  04/12/2016 chest CT, CXR 06/17/2014, PET-CT 08/24/2016 FINDINGS: Heart is top-normal in size. The thoracic aorta is ectatic in appearance with atherosclerosis. Old left-sided sixth rib fracture is identified. Status post right mastectomy with postop change along the periphery the right lung. Right axillary lymph node dissection clips are also present. Degenerative changes are noted about the dorsal spine and both shoulders. IMPRESSION: 1. Right mastectomy with postop change along the periphery of the right lung and right axilla. 2. Aortic atherosclerosis. 3. Old left-sided sixth rib fracture. Electronically Signed   By: Ashley Royalty M.D.   On: 12/13/2016 21:01    Procedures Procedures (including critical care time)  Medications Ordered in ED Medications  HYDROcodone-acetaminophen (NORCO/VICODIN) 5-325 MG per tablet 1 tablet (1 tablet Oral Given 12/14/16 0005)     Initial Impression / Assessment and Plan / ED Course  I have reviewed the triage vital signs and the nursing notes.  Pertinent labs & imaging results that were available during my care of the patient were reviewed by me and considered in my medical decision making (see chart for details).  83 year old female with right sided chest wall pain. She is hypertensive but otherwise vitals are normal. She is calm and comfortable appearing. CBC remarkable for mild anemia. BMP unremarkable. Trop is 0. EKG is SR without significant change. CXR negative. Shared visit with Dr. Lita Mains. Will d/c with close PCP follow up. Return precautions given.  Final Clinical Impressions(s) / ED Diagnoses   Final diagnoses:  Right-sided chest wall pain    New Prescriptions New Prescriptions   No medications on file     Iris Pert 12/14/16 0013    Julianne Rice, MD 12/23/16 1310

## 2016-12-13 NOTE — ED Notes (Signed)
Jennifer Garrett- [phone]  Please call pts daughter for questions or for transportation

## 2016-12-13 NOTE — ED Triage Notes (Signed)
Pt states she ate some saugage this after noon and has had SOB since; pt state she is having some right side breast pain from breast removal about 5 months ago. Pt a&ox 4 on arrival Pt states pain at 6/10 on arrival. Pt was wheeled into triage by family. Pt states she ambulates independently and lives with family.

## 2016-12-13 NOTE — Discharge Instructions (Signed)
Take Tylenol as needed for pain Follow up with your doctor Return for worsening symptoms

## 2016-12-14 DIAGNOSIS — R0789 Other chest pain: Secondary | ICD-10-CM | POA: Diagnosis not present

## 2016-12-27 ENCOUNTER — Ambulatory Visit (HOSPITAL_BASED_OUTPATIENT_CLINIC_OR_DEPARTMENT_OTHER): Payer: Medicare Other | Admitting: Oncology

## 2016-12-27 VITALS — BP 182/60 | HR 59 | Temp 97.8°F | Resp 18 | Ht 62.0 in | Wt 150.3 lb

## 2016-12-27 DIAGNOSIS — C773 Secondary and unspecified malignant neoplasm of axilla and upper limb lymph nodes: Secondary | ICD-10-CM | POA: Diagnosis not present

## 2016-12-27 DIAGNOSIS — R918 Other nonspecific abnormal finding of lung field: Secondary | ICD-10-CM

## 2016-12-27 DIAGNOSIS — C50912 Malignant neoplasm of unspecified site of left female breast: Secondary | ICD-10-CM

## 2016-12-27 DIAGNOSIS — C50511 Malignant neoplasm of lower-outer quadrant of right female breast: Secondary | ICD-10-CM | POA: Diagnosis present

## 2016-12-27 DIAGNOSIS — Z171 Estrogen receptor negative status [ER-]: Secondary | ICD-10-CM

## 2016-12-27 DIAGNOSIS — C50311 Malignant neoplasm of lower-inner quadrant of right female breast: Secondary | ICD-10-CM

## 2016-12-27 NOTE — Progress Notes (Signed)
.  [facility]  Telephone:(336) [phone] Fax:(336) [phone]     ID: Jennifer Garrett DOB: 1932-03-25  MR#: [id number]  SVX#:[id number]  Patient Care Team: Nolene Ebbs, MD as PCP - General (Internal Medicine) Fanny Skates, MD as Consulting Physician (General Surgery) Magrinat, Virgie Dad, MD as Consulting Physician (Oncology) Minus Breeding, MD as Consulting Physician (Cardiology) Irene Limbo, MD as Consulting Physician (Plastic Surgery) PCP: Nolene Ebbs, MD Chauncey Cruel, MD GYN: SU:  OTHER MD:  CHIEF COMPLAINT: Locally advanced triple negative breast cancer  CURRENT TREATMENT: Observation   BREAST CANCER HISTORY: From the original intake note:  The patient presented to the emergency room 11/20/2015 with a complaint of a mass in the lower aspect of her right breast, which was painful and bleeding. The patient stated the mass had been present approximately a month. An ultrasound of the breast was obtained which found a 4.4 cm mass in the right breast at the 6:00 position. This was read as most consistent with an abscess. The patient was started on clindamycin and referred to the [facility], where on 01/05/2016 she underwent bilateral diagnostic mammography with tomography and right  ultrasonography.   Mammography showed far posteriorly in the 5:00 region of the right breast a 4.1 cm mass with enlarged axillary adenopathy. On exam there was a fungating ulcerated bleeding mass in the right breast at the 5:00 position 7 cm from the nipple. The right axilla was "full" on palpation. Ultrasound showed a 5.4 cm mass at the 5:00 position of the right breast 7 cm from the nipple, with at least 3 enlarged abnormal axillary lymph nodes, the largest measuring 3.7 cm.  Biopsy of the right breast mass and one axillary lymph node on 01/06/2016 showed ([id number]) both to be positive for an invasive ductal carcinoma, grade 3, triple negative, with the HER-2 signals  ratio between 1.21-1.40 and the number per cell 2.85-3.00. The MIB-1-2 was 70%.  The patient's subsequent history is as detailed below  INTERVAL HISTORY:  Jennifer Garrett returns today for follow-up of her right sided estrogen receptor negative breast cancer accompanied by her daughter [name] and the patient's grandson. Since her last visit here [name] underwent radiation to the right chest wall area. She generally tolerated this well. There is an area in the upper right chest wall that sometimes feels "cold" to her. Otherwise she does not have any unusual pain in the treated site of the chest wall.    REVIEW OF SYSTEMS: Aurilla had no complaints today. Her daughter also tells me she is back to her normal functional status. A detailed review of systems was otherwise noncontributory  PAST MEDICAL HISTORY: Past Medical History:  Diagnosis Date  . Arthritis   . Asthma    rarely uses neb  . Breast cancer ([facility]) 05/25/2016   right breast  . Breast cancer of lower-inner quadrant of right female breast ([facility]) 01/22/2016  . Carpal tunnel syndrome   . Coronary artery disease    DES to OM3 90% stenosis 2003, 90% small RCA stenosis.    . Diabetes mellitus   . History of stomach ulcers   . Hyperlipemia   . Hypertension   . Irregular heart beat     PAST SURGICAL HISTORY: Past Surgical History:  Procedure Laterality Date  . ABDOMINAL HYSTERECTOMY    . CARDIAC CATHETERIZATION  2003   stent-  . CARPAL TUNNEL RELEASE  03/29/2012   Procedure: CARPAL TUNNEL RELEASE;  Surgeon: Wynonia Sours, MD;  Location: [facility];  Service: Orthopedics;  Laterality: Left;  . EYE SURGERY     catracts  . LESION EXCISION WITH COMPLEX REPAIR Right 05/25/2016   Procedure: complex repair of 25cm wound;  Surgeon: Irene Limbo, MD;  Location: [facility];  Service: Plastics;  Laterality: Right;  . MASTECTOMY MODIFIED RADICAL Right 05/25/2016   Procedure: RIGHT MASTECTOMY MODIFIED RADICAL;  Surgeon: Fanny Skates, MD;   Location: [facility];  Service: General;  Laterality: Right;  . SHOULDER ARTHROSCOPY     right and left  . TRIGGER FINGER RELEASE  03/29/2012   Procedure: RELEASE TRIGGER FINGER/A-1 PULLEY;  Surgeon: Wynonia Sours, MD;  Location: [facility];  Service: Orthopedics;  Laterality: Left;    FAMILY HISTORY Family History  Problem Relation Age of Onset  . Heart disease Mother        No details   The patient's father died from "old age" at age 20. The patient's mother died at age 27 from complications of high blood pressure and diabetes. The patient had one brother, 4 sisters. There is no cancer in the family to her knowledge   GYNECOLOGIC HISTORY:  No LMP recorded. Patient has had a hysterectomy.  menarche age 52, first live birth age 39. The patient is GX P2. She underwent abdominal hysterectomy with bilateral salpingo-oophorectomy in her 64s. She did not undergo hormone replacement  SOCIAL HISTORY:  Sofie used to work for [employer]. She lives with her daughter Shannell Mikkelsen, who is Medical sales representative for [employer]. The patient's son Kendra Grissett lives in [place]. The patient has 1 grandchild, no great-grandchildren. She attends a local [employer] DIRECTIVES:  not in place. At the 01/22/2016 visit the patient was given the appropriate documents to complete. She tells me she intends to name her daughter [name] as her healthcare part of attorney    HEALTH MAINTENANCE: Social History  Substance Use Topics  . Smoking status: Former Smoker    Quit date: 03/24/1978  . Smokeless tobacco: Never Used  . Alcohol use No     Colonoscopy: Never   PAP: status post hysterectomy   Bone density: Never    Allergies  Allergen Reactions  . Lyrica [Pregabalin] Swelling    SWELLING REACTION UNSPECIFIED   . Feldene [Piroxicam] Rash  . Lisinopril Other (See Comments)    States that it makes her "sensitive to light"  . Orudis [Ketoprofen] Rash  .  Vibramycin [Doxycycline Calcium] Rash and Hives    Current Outpatient Prescriptions  Medication Sig Dispense Refill  . albuterol (PROVENTIL HFA;VENTOLIN HFA) 108 (90 BASE) MCG/ACT inhaler Inhale 1 puff into the lungs every 6 (six) hours as needed for wheezing or shortness of breath. Reported on 02/04/2016    . aspirin EC 81 MG tablet Take 1 tablet by mouth every morning.    Marland Kitchen atorvastatin (LIPITOR) 40 MG tablet Take 1 tablet (40 mg total) by mouth daily. (Patient taking differently: Take 40 mg by mouth daily at 6 PM. ) 90 tablet 3  . Blood Glucose Monitoring Suppl (ACCU-CHEK AVIVA PLUS) w/Device KIT 1 Device by Does not apply route 2 (two) times daily. 1 kit 0  . calcium carbonate (OS-CAL) 600 MG TABS Take 600 mg by mouth 2 (two) times daily with a meal.    . diclofenac sodium (VOLTAREN) 1 % GEL Apply 1 application topically every other day.    . donepezil (ARICEPT) 5 MG tablet Take 5 mg by mouth at bedtime.    Marland Kitchen  ferrous sulfate 325 (65 FE) MG tablet Take 1 tablet (325 mg total) by mouth 2 (two) times daily with a meal. 60 tablet 3  . glipiZIDE (GLUCOTROL) 10 MG tablet Take 10 mg by mouth daily as needed (for high blood sugar (greater than 150)).     Marland Kitchen glucose blood (ACCU-CHEK AVIVA) test strip Use as instructed 100 each 12  . hydrochlorothiazide (MICROZIDE) 12.5 MG capsule Take 12.5 mg by mouth daily.    Marland Kitchen KLOR-CON M20 20 MEQ tablet Take 20 mEq by mouth daily.    . Lancets (ACCU-CHEK SOFT TOUCH) lancets Use as instructed 100 each 12  . naproxen sodium (ANAPROX) 220 MG tablet Take 220 mg by mouth 2 (two) times daily as needed (for pain.).    Marland Kitchen olmesartan (BENICAR) 40 MG tablet Take 40 mg by mouth daily.     No current facility-administered medications for this visit.     OBJECTIVE: Older African-American woman Who appears stated age  53:   12/27/16 1146  BP: (!) 182/60  Pulse: (!) 59  Resp: 18  Temp: 97.8 F (36.6 C)     Body mass index is 27.49 kg/m.    ECOG FS:0 -  Asymptomatic  Sclerae unicteric, pupils round and equal Oropharynx clear, slightly dry No cervical or supraclavicular adenopathy Lungs no rales or rhonchi Heart regular rate and rhythm Abd soft, nontender, positive bowel sounds MSK no focal spinal tenderness Neuro: nonfocal, appropriate affect Breasts: The right breast is undergone mastectomy and radiation. There is some hyperpigmentation. The area where she feels "cold" sometimes is the upper medial chest wall. That area is entirely flat, there is no erythema, swelling, or tenderness. The right chest wall shows no evidence of local recurrence. The left breast is unremarkable. The left axilla however there is a large lymph node measuring approximately 2-1/2 cm. It is firm and fixed. The right axilla is benign.   Right axilla and chest wall post-op, 06/23/2016    Photo right inframammary fold 04/14/2016    Photo dorsum right breast 04/14/2016       Right breast 03/05/2016    LAB RESULTS:  CMP     Component Value Date/Time   NA 137 12/13/2016 2227   NA 142 09/14/2016 0912   K 3.5 12/13/2016 2227   K 3.9 09/14/2016 0912   CL 105 12/13/2016 2227   CO2 25 12/13/2016 2227   CO2 28 09/14/2016 0912   GLUCOSE 115 (H) 12/13/2016 2227   GLUCOSE 152 (H) 09/14/2016 0912   BUN 9 12/13/2016 2227   BUN 17.9 09/14/2016 0912   CREATININE 0.82 12/13/2016 2227   CREATININE 1.1 09/14/2016 0912   CALCIUM 9.3 12/13/2016 2227   CALCIUM 9.9 09/14/2016 0912   PROT 7.7 09/14/2016 0912   ALBUMIN 3.7 09/14/2016 0912   AST 18 09/14/2016 0912   ALT 12 09/14/2016 0912   ALKPHOS 74 09/14/2016 0912   BILITOT 0.42 09/14/2016 0912   GFRNONAA >60 12/13/2016 2227   GFRAA >60 12/13/2016 2227    INo results found for: SPEP, UPEP  Lab Results  Component Value Date   WBC 5.3 12/13/2016   NEUTROABS 3.0 12/13/2016   HGB 10.2 (L) 12/13/2016   HCT 32.0 (L) 12/13/2016   MCV 84.7 12/13/2016   PLT 223 12/13/2016      Chemistry       Component Value Date/Time   NA 137 12/13/2016 2227   NA 142 09/14/2016 0912   K 3.5 12/13/2016 2227   K 3.9 09/14/2016 0912  CL 105 12/13/2016 2227   CO2 25 12/13/2016 2227   CO2 28 09/14/2016 0912   BUN 9 12/13/2016 2227   BUN 17.9 09/14/2016 0912   CREATININE 0.82 12/13/2016 2227   CREATININE 1.1 09/14/2016 0912      Component Value Date/Time   CALCIUM 9.3 12/13/2016 2227   CALCIUM 9.9 09/14/2016 0912   ALKPHOS 74 09/14/2016 0912   AST 18 09/14/2016 0912   ALT 12 09/14/2016 0912   BILITOT 0.42 09/14/2016 0912       No results found for: LABCA2  No components found for: LABCA125  No results for input(s): INR in the last 168 hours.  Urinalysis    Component Value Date/Time   COLORURINE YELLOW 05/19/2016 1138   APPEARANCEUR CLEAR 05/19/2016 1138   LABSPEC 1.017 05/19/2016 1138   PHURINE 5.5 05/19/2016 1138   GLUCOSEU NEGATIVE 05/19/2016 1138   HGBUR NEGATIVE 05/19/2016 1138   BILIRUBINUR NEGATIVE 05/19/2016 1138   KETONESUR NEGATIVE 05/19/2016 1138   PROTEINUR NEGATIVE 05/19/2016 1138   UROBILINOGEN 1.0 11/28/2013 0143   NITRITE NEGATIVE 05/19/2016 1138   LEUKOCYTESUR NEGATIVE 05/19/2016 1138     STUDIES: Dg Chest 2 View  Result Date: 12/13/2016 CLINICAL DATA:  Dyspnea after eating sausage this afternoon. Right-sided breast pain after breast removal 5 months ago. EXAM: CHEST  2 VIEW COMPARISON:  04/12/2016 chest CT, CXR 06/17/2014, PET-CT 08/24/2016 FINDINGS: Heart is top-normal in size. The thoracic aorta is ectatic in appearance with atherosclerosis. Old left-sided sixth rib fracture is identified. Status post right mastectomy with postop change along the periphery the right lung. Right axillary lymph node dissection clips are also present. Degenerative changes are noted about the dorsal spine and both shoulders. IMPRESSION: 1. Right mastectomy with postop change along the periphery of the right lung and right axilla. 2. Aortic atherosclerosis. 3. Old left-sided  sixth rib fracture. Electronically Signed   By: Ashley Royalty M.D.   On: 12/13/2016 21:01    ELIGIBLE FOR AVAILABLE RESEARCH PROTOCOL: no   ASSESSMENT: 83 y.o. [place] woman status post right breast lower outer quadrant and right axillary lymph node biopsy 01/06/2016 both positive for a clinical T4 N1 M1, stage IV invasive ductal carcinoma, grade 3, triple negative, with an MIB-1 of 70%  (a) CT scan of the chest and bone scan 01/27/2016 showed scattered pulmonary nodules in addition to locally advanced disease  (b) repeat CT scan of the chest 04/12/2016 shows persistent pulmonary nodules, progression in local disease  (c) CA-27-29 is noninformative  (1) neoadjuvant chemotherapy consisting of capecitabine, 1500 mg twice daily, 7 days on 7 days off, starting 01/27/2016, discontinued 04/14/2016 with progression  (2) right modified radical mastectomy 05/25/2016 showed a pT3 pN2, stage IIIA invasive ductal carcinoma, grade 3, with negative margins.  (3) adjuvant radiation 08/24/16- 10/07/16 Site/dose:   1) Right chest wall / 50.4 Gy in 28 fractions                         2) Right chest wall boost / 10 Gy in 5 fractions  (4) restaging studies and follow-up:  (a) PET scan 08/24/2016 was negative except for a 1.0 cm LEFT axillary lymph node  PLAN: Rashad is now 3 months out from completing radiation to her right chest wall. She did well with her local treatment and there is no evidence of right chest wall recurrence.  She had a restaging PET scan in February obtained to check for lung nodules. There was a left  axillary lymph node noted at that time. We thought this could well be not related to the patient's breast cancer, which was on the opposite side. Today however that lymph node seems more prominent. It  warrants biopsy and I have entered the appropriate orders.  Surabhi will return in approximately 2 weeks. By then we should have that report. Given her prior discussions treatment options will be  limited unless we are dealing with an estrogen receptor positive breast cancer, in which case I would suggest anastrozole.  They know to call for any other problems that may develop before her next visit here.  Chauncey Cruel, MD   12/27/2016 11:49 AM Medical Oncology and Hematology [facility] [address] [place], [place] [address] Tel. [phone]    Fax. [phone]

## 2017-01-15 ENCOUNTER — Other Ambulatory Visit: Payer: Self-pay | Admitting: Oncology

## 2017-01-21 ENCOUNTER — Other Ambulatory Visit: Payer: Self-pay | Admitting: *Deleted

## 2017-01-21 ENCOUNTER — Ambulatory Visit (HOSPITAL_BASED_OUTPATIENT_CLINIC_OR_DEPARTMENT_OTHER): Payer: Medicare Other | Admitting: Oncology

## 2017-01-21 VITALS — BP 160/109 | HR 53 | Temp 97.8°F | Resp 18 | Ht 62.0 in | Wt 147.0 lb

## 2017-01-21 DIAGNOSIS — C773 Secondary and unspecified malignant neoplasm of axilla and upper limb lymph nodes: Secondary | ICD-10-CM

## 2017-01-21 DIAGNOSIS — C50311 Malignant neoplasm of lower-inner quadrant of right female breast: Secondary | ICD-10-CM

## 2017-01-21 DIAGNOSIS — C50912 Malignant neoplasm of unspecified site of left female breast: Secondary | ICD-10-CM

## 2017-01-21 DIAGNOSIS — C7800 Secondary malignant neoplasm of unspecified lung: Secondary | ICD-10-CM

## 2017-01-21 DIAGNOSIS — Z171 Estrogen receptor negative status [ER-]: Secondary | ICD-10-CM | POA: Diagnosis not present

## 2017-01-21 NOTE — Progress Notes (Signed)
.  [place]  [phone] [phone]) [phone]     ID: Jennifer Garrett DOB: 21-Jan-1932  MR#: [id number]  TWS#:[id number]  Patient Care Team: Nolene Ebbs, MD as PCP - General (Internal Medicine) Fanny Skates, MD as Consulting Physician (General Surgery) Neco Kling, Virgie Dad, MD as Consulting Physician (Oncology) Minus Breeding, MD as Consulting Physician (Cardiology) Irene Limbo, MD as Consulting Physician (Plastic Surgery) PCP: Nolene Ebbs, MD Chauncey Cruel, MD GYN: SU:  OTHER MD:  CHIEF COMPLAINT: Locally advanced triple negative breast cancer  CURRENT TREATMENT: Observation  INTERVAL HISTORY:  Jennifer Garrett returns today for follow-up of her triple negative breast cancer accompanied by her daughter. At the last visit here I said [place] up for biopsy of a left axillary mass noted on PET scan February 2018. However for some reason the family was never contacted and a biopsy was never done.    REVIEW OF SYSTEMS: Oral and her daughter tells me things are about "the same". The patient stays around the house, sleeps a lot, does a little bit of housework, "only light work". There has been no intercurrent pain, fever, bleeding, rash, significant weight loss, cough, phlegm production, pleurisy, persistent or intense headaches, or other new symptoms. A detailed review of systems Gen. today was stable.   BREAST CANCER HISTORY: From the original intake note:  The patient presented to the emergency room 11/20/2015 with a complaint of a mass in the lower aspect of her right breast, which was painful and bleeding. The patient stated the mass had been present approximately a month. An ultrasound of the breast was obtained which found a 4.4 cm mass in the right breast at the 6:00 position. This was read as most consistent with an abscess. The patient was started on clindamycin and referred to the [facility], where on 01/05/2016 she underwent bilateral diagnostic  mammography with tomography and right  ultrasonography.   Mammography showed far posteriorly in the 5:00 region of the right breast a 4.1 cm mass with enlarged axillary adenopathy. On exam there was a fungating ulcerated bleeding mass in the right breast at the 5:00 position 7 cm from the nipple. The right axilla was "full" on palpation. Ultrasound showed a 5.4 cm mass at the 5:00 position of the right breast 7 cm from the nipple, with at least 3 enlarged abnormal axillary lymph nodes, the largest measuring 3.7 cm.  Biopsy of the right breast mass and one axillary lymph node on 01/06/2016 showed ([id number]) both to be positive for an invasive ductal carcinoma, grade 3, triple negative, with the HER-2 signals ratio between 1.21-1.40 and the number per cell 2.85-3.00. The MIB-1-2 was 70%.  The patient's subsequent history is as detailed below    PAST MEDICAL HISTORY: Past Medical History:  Diagnosis Date  . Arthritis   . Asthma    rarely uses neb  . Breast cancer ([facility]) 05/25/2016   right breast  . Breast cancer of lower-inner quadrant of right female breast ([facility]) 01/22/2016  . Carpal tunnel syndrome   . Coronary artery disease    DES to OM3 90% stenosis 2003, 90% small RCA stenosis.    . Diabetes mellitus   . History of stomach ulcers   . Hyperlipemia   . Hypertension   . Irregular heart beat     PAST SURGICAL HISTORY: Past Surgical History:  Procedure Laterality Date  . ABDOMINAL HYSTERECTOMY    . CARDIAC CATHETERIZATION  2003   stent-  . CARPAL TUNNEL RELEASE  03/29/2012  Procedure: CARPAL TUNNEL RELEASE;  Surgeon: Wynonia Sours, MD;  Location: [facility];  Service: Orthopedics;  Laterality: Left;  . EYE SURGERY     catracts  . LESION EXCISION WITH COMPLEX REPAIR Right 05/25/2016   Procedure: complex repair of 25cm wound;  Surgeon: Irene Limbo, MD;  Location: [facility];  Service: Plastics;  Laterality: Right;  . MASTECTOMY MODIFIED RADICAL Right 05/25/2016    Procedure: RIGHT MASTECTOMY MODIFIED RADICAL;  Surgeon: Fanny Skates, MD;  Location: [facility];  Service: General;  Laterality: Right;  . SHOULDER ARTHROSCOPY     right and left  . TRIGGER FINGER RELEASE  03/29/2012   Procedure: RELEASE TRIGGER FINGER/A-1 PULLEY;  Surgeon: Wynonia Sours, MD;  Location: [facility];  Service: Orthopedics;  Laterality: Left;    FAMILY HISTORY Family History  Problem Relation Age of Onset  . Heart disease Mother        No details   The patient's father died from "old age" at age 21. The patient's mother died at age 60 from complications of high blood pressure and diabetes. The patient had one brother, 4 sisters. There is no cancer in the family to her knowledge   GYNECOLOGIC HISTORY:  No LMP recorded. Patient has had a hysterectomy.  menarche age 44, first live birth age 7. The patient is GX P2. She underwent abdominal hysterectomy with bilateral salpingo-oophorectomy in her 44s. She did not undergo hormone replacement  SOCIAL HISTORY:  Jennifer Garrett used to work for [employer]. She lives with her daughter Jennifer Garrett, who is Medical sales representative for [employer]. The patient's son Jennifer Garrett lives in [place]. The patient has 1 grandchild, no great-grandchildren. She attends a local [employer] DIRECTIVES:  not in place. At the 01/22/2016 visit the patient was given the appropriate documents to complete. She tells me she intends to name her daughter Jennifer Garrett as her healthcare part of attorney    HEALTH MAINTENANCE: Social History  Substance Use Topics  . Smoking status: Former Smoker    Quit date: 03/24/1978  . Smokeless tobacco: Never Used  . Alcohol use No     Colonoscopy: Never   PAP: status post hysterectomy   Bone density: Never    Allergies  Allergen Reactions  . Lyrica [Pregabalin] Swelling    SWELLING REACTION UNSPECIFIED   . Feldene [Piroxicam] Rash  . Lisinopril Other (See Comments)    States that  it makes her "sensitive to light"  . Orudis [Ketoprofen] Rash  . Vibramycin [Doxycycline Calcium] Rash and Hives    Current Outpatient Prescriptions  Medication Sig Dispense Refill  . albuterol (PROVENTIL HFA;VENTOLIN HFA) 108 (90 BASE) MCG/ACT inhaler Inhale 1 puff into the lungs every 6 (six) hours as needed for wheezing or shortness of breath. Reported on 02/04/2016    . aspirin EC 81 MG tablet Take 1 tablet by mouth every morning.    Marland Kitchen atorvastatin (LIPITOR) 40 MG tablet Take 1 tablet (40 mg total) by mouth daily. (Patient taking differently: Take 40 mg by mouth daily at 6 PM. ) 90 tablet 3  . Blood Glucose Monitoring Suppl (ACCU-CHEK AVIVA PLUS) w/Device KIT 1 Device by Does not apply route 2 (two) times daily. 1 kit 0  . calcium carbonate (OS-CAL) 600 MG TABS Take 600 mg by mouth 2 (two) times daily with a meal.    . diclofenac sodium (VOLTAREN) 1 % GEL Apply 1 application topically every other day.    Marland Kitchen  donepezil (ARICEPT) 5 MG tablet Take 5 mg by mouth at bedtime.    . ferrous sulfate 325 (65 FE) MG tablet Take 1 tablet (325 mg total) by mouth 2 (two) times daily with a meal. 60 tablet 3  . glipiZIDE (GLUCOTROL) 10 MG tablet Take 10 mg by mouth daily as needed (for high blood sugar (greater than 150)).     Marland Kitchen glucose blood (ACCU-CHEK AVIVA) test strip Use as instructed 100 each 12  . hydrochlorothiazide (MICROZIDE) 12.5 MG capsule Take 12.5 mg by mouth daily.    Marland Kitchen KLOR-CON M20 20 MEQ tablet Take 20 mEq by mouth daily.    . Lancets (ACCU-CHEK SOFT TOUCH) lancets Use as instructed 100 each 12  . naproxen sodium (ANAPROX) 220 MG tablet Take 220 mg by mouth 2 (two) times daily as needed (for pain.).    Marland Kitchen olmesartan (BENICAR) 40 MG tablet Take 40 mg by mouth daily.     No current facility-administered medications for this visit.     OBJECTIVE: Older African-American womanWho appears stated age  40:   01/21/17 1125  BP: (!) 160/109  Pulse: (!) 53  Resp: 18  Temp: 97.8 F (36.6  C)     Body mass index is 26.89 kg/m.    ECOG FS:1 - Symptomatic but completely ambulatory  Sclerae unicteric, EOMs intact Oropharynx clear and moist No cervical or supraclavicular adenopathy Lungs no rales or rhonchi Heart regular rate and rhythm Abd soft, nontender, positive bowel sounds MSK no focal spinal tenderness, no upper extremity lymphedema Neuro: nonfocal, well oriented, appropriate affect Breasts: The right breast is status post mastectomy followed by radiation. There is still hyperpigmentation. I do not palpate or observe any evidence of right-sided local recurrence. The left breast is unremarkable. In the left axilla however there is a rubbery firm and fixed mass measuring at least 2 cm. There are no skin changes.   LAB RESULTS:  CMP     Component Value Date/Time   NA 137 12/13/2016 2227   NA 142 09/14/2016 0912   K 3.5 12/13/2016 2227   K 3.9 09/14/2016 0912   CL 105 12/13/2016 2227   CO2 25 12/13/2016 2227   CO2 28 09/14/2016 0912   GLUCOSE 115 (H) 12/13/2016 2227   GLUCOSE 152 (H) 09/14/2016 0912   BUN 9 12/13/2016 2227   BUN 17.9 09/14/2016 0912   CREATININE 0.82 12/13/2016 2227   CREATININE 1.1 09/14/2016 0912   CALCIUM 9.3 12/13/2016 2227   CALCIUM 9.9 09/14/2016 0912   PROT 7.7 09/14/2016 0912   ALBUMIN 3.7 09/14/2016 0912   AST 18 09/14/2016 0912   ALT 12 09/14/2016 0912   ALKPHOS 74 09/14/2016 0912   BILITOT 0.42 09/14/2016 0912   GFRNONAA >60 12/13/2016 2227   GFRAA >60 12/13/2016 2227    INo results found for: SPEP, UPEP  Lab Results  Component Value Date   WBC 5.3 12/13/2016   NEUTROABS 3.0 12/13/2016   HGB 10.2 (L) 12/13/2016   HCT 32.0 (L) 12/13/2016   MCV 84.7 12/13/2016   PLT 223 12/13/2016      Chemistry      Component Value Date/Time   NA 137 12/13/2016 2227   NA 142 09/14/2016 0912   K 3.5 12/13/2016 2227   K 3.9 09/14/2016 0912   CL 105 12/13/2016 2227   CO2 25 12/13/2016 2227   CO2 28 09/14/2016 0912   BUN 9  12/13/2016 2227   BUN 17.9 09/14/2016 0912   CREATININE 0.82 12/13/2016  2227   CREATININE 1.1 09/14/2016 0912      Component Value Date/Time   CALCIUM 9.3 12/13/2016 2227   CALCIUM 9.9 09/14/2016 0912   ALKPHOS 74 09/14/2016 0912   AST 18 09/14/2016 0912   ALT 12 09/14/2016 0912   BILITOT 0.42 09/14/2016 0912       No results found for: LABCA2  No components found for: LABCA125  No results for input(s): INR in the last 168 hours.  Urinalysis    Component Value Date/Time   COLORURINE YELLOW 05/19/2016 1138   APPEARANCEUR CLEAR 05/19/2016 1138   LABSPEC 1.017 05/19/2016 1138   PHURINE 5.5 05/19/2016 1138   GLUCOSEU NEGATIVE 05/19/2016 1138   HGBUR NEGATIVE 05/19/2016 1138   BILIRUBINUR NEGATIVE 05/19/2016 1138   KETONESUR NEGATIVE 05/19/2016 1138   PROTEINUR NEGATIVE 05/19/2016 1138   UROBILINOGEN 1.0 11/28/2013 0143   NITRITE NEGATIVE 05/19/2016 1138   LEUKOCYTESUR NEGATIVE 05/19/2016 1138     STUDIES: Chest x-ray 12/05/2016 showed no evidence of measurable disease.  ELIGIBLE FOR AVAILABLE RESEARCH PROTOCOL: no   ASSESSMENT: 83 y.o. [place] woman status post right breast lower outer quadrant and right axillary lymph node biopsy 01/06/2016 both positive for a clinical T4 N1 M1, stage IV invasive ductal carcinoma, grade 3, triple negative, with an MIB-1 of 70%  (a) CT scan of the chest and bone scan 01/27/2016 showed scattered pulmonary nodules in addition to locally advanced disease  (b) repeat CT scan of the chest 04/12/2016 shows persistent pulmonary nodules, progression in local disease  (c) CA-27-29 is noninformative  (1) neoadjuvant chemotherapy consisting of capecitabine, 1500 mg twice daily, 7 days on 7 days off, starting 01/27/2016, discontinued 04/14/2016 with progression  (2) right modified radical mastectomy 05/25/2016 showed a pT3 pN2, stage IIIA invasive ductal carcinoma, grade 3, with negative margins.  (3) adjuvant radiation 08/24/16-  10/07/16 Site/dose:   1) Right chest wall / 50.4 Gy in 28 fractions                         2) Right chest wall boost / 10 Gy in 5 fractions  (4) restaging studies and follow-up:  (a) PET scan 08/24/2016 was negative except for a 1.0 cm LEFT axillary lymph node  PLAN: [facility] was supposed to have had the biopsy already of the left axillary mass, but that wasn't done for some reason--apparently they had some difficulty contacting the patient's daughter Jennifer Garrett.  I have reentered those orders hopefully to be done next week, 01/26/2017. I gave Jennifer Garrett the phone number of the [facility] so if she hasn't heard by late 01/24/2017 she will call them and I also make sure that we had the right phone number for them to call.  It is easiest on Jennifer Garrett if her mother is seen on Wednesdays. The next Wednesday that I have available is in August and that's the appointment we made, but as soon as I have a copy of the pathology report particularly with the estrogen and progesterone receptor status, I will give them a call  They know to call for any other issues that may develop before the next visit here.  Chauncey Cruel, MD   01/21/2017 11:50 AM Medical Oncology and Hematology [facility] [address] [place], [place] [address] Tel. [phone]    Fax. [phone]

## 2017-01-27 ENCOUNTER — Inpatient Hospital Stay: Admission: RE | Admit: 2017-01-27 | Source: Ambulatory Visit

## 2017-01-27 ENCOUNTER — Encounter

## 2017-02-15 ENCOUNTER — Encounter: Payer: Self-pay | Admitting: Podiatry

## 2017-02-15 ENCOUNTER — Ambulatory Visit (INDEPENDENT_AMBULATORY_CARE_PROVIDER_SITE_OTHER): Payer: Medicare Other | Admitting: Podiatry

## 2017-02-15 DIAGNOSIS — M2010 Hallux valgus (acquired), unspecified foot: Secondary | ICD-10-CM

## 2017-02-15 DIAGNOSIS — B351 Tinea unguium: Secondary | ICD-10-CM

## 2017-02-15 DIAGNOSIS — E1140 Type 2 diabetes mellitus with diabetic neuropathy, unspecified: Secondary | ICD-10-CM

## 2017-02-15 DIAGNOSIS — M79676 Pain in unspecified toe(s): Secondary | ICD-10-CM | POA: Diagnosis not present

## 2017-02-15 DIAGNOSIS — M79609 Pain in unspecified limb: Principal | ICD-10-CM

## 2017-02-15 NOTE — Progress Notes (Signed)
Patient ID: Jennifer Garrett, female   DOB: June 29, 1932, 83 y.o.   MRN: [id number] Complaint:  Visit Type: Patient returns to my office for continued preventative foot care services. Complaint: Patient states" my nails have grown long and thick and become painful to walk and wear shoes" Patient has been diagnosed with DM with neuropathy.. The patient presents for preventative foot care services. No changes to ROS  Podiatric Exam: Vascular: dorsalis pedis and posterior tibial pulses are palpable bilateral. Capillary return is immediate. Temperature gradient is WNL. Skin turgor WNL  Sensorium: Decreased  Semmes Weinstein monofilament test. Normal tactile sensation bilaterally. Nail Exam: Pt has thick disfigured discolored nails with subungual debris noted bilateral entire nail hallux through fifth toenails Ulcer Exam: There is no evidence of ulcer or pre-ulcerative changes or infection. Orthopedic Exam: Muscle tone and strength are WNL. No limitations in general ROM. No crepitus or effusions noted. HAV  B/L. Skin: No Porokeratosis. No infection or ulcers  Diagnosis:  Onychomycosis, ,Diabetes with neuropathy.  HAV  B/L.  Treatment & Plan Procedures and Treatment: Consent by patient was obtained for treatment procedures. The patient understood the discussion of treatment and procedures well. All questions were answered thoroughly reviewed. Debridement of mycotic and hypertrophic toenails, 1 through 5 bilateral and clearing of subungual debris. No ulceration, no infection noted.   Return Visit-Office Procedure: Patient instructed to return to the office for a follow up visit 3 months for continued evaluation and treatment.    Gardiner Barefoot DPM

## 2017-02-17 ENCOUNTER — Telehealth: Payer: Self-pay | Admitting: Emergency Medicine

## 2017-02-17 ENCOUNTER — Other Ambulatory Visit: Payer: Self-pay | Admitting: Oncology

## 2017-02-17 DIAGNOSIS — Z171 Estrogen receptor negative status [ER-]: Principal | ICD-10-CM

## 2017-02-17 DIAGNOSIS — C773 Secondary and unspecified malignant neoplasm of axilla and upper limb lymph nodes: Secondary | ICD-10-CM

## 2017-02-17 DIAGNOSIS — C50912 Malignant neoplasm of unspecified site of left female breast: Secondary | ICD-10-CM

## 2017-02-17 DIAGNOSIS — C50311 Malignant neoplasm of lower-inner quadrant of right female breast: Secondary | ICD-10-CM

## 2017-02-17 NOTE — Telephone Encounter (Signed)
Return phone call made to Jennifer Garrett daughter of patient Jennifer Garrett regarding ultra sound biopsy that was never scheduled per patient.    This nurse called [facility] to schedule ultra sound biopsy for patient. appt made for 8/7 @ 2:45pm. Results can be in next day after 12 noon. 11:30 appt moved to 3:15 so that results will be available for MD day of appointment.

## 2017-02-22 ENCOUNTER — Ambulatory Visit
Admission: RE | Admit: 2017-02-22 | Discharge: 2017-02-22 | Disposition: A | Discharge status: Home / Self Care | Payer: Medicare Other | Source: Ambulatory Visit | Attending: Oncology | Admitting: Oncology

## 2017-02-22 ENCOUNTER — Other Ambulatory Visit: Payer: Self-pay | Admitting: Oncology

## 2017-02-22 ENCOUNTER — Ambulatory Visit: Admission: RE | Admit: 2017-02-22 | Payer: Medicare Other | Source: Ambulatory Visit

## 2017-02-22 DIAGNOSIS — C50311 Malignant neoplasm of lower-inner quadrant of right female breast: Secondary | ICD-10-CM

## 2017-02-22 DIAGNOSIS — C773 Secondary and unspecified malignant neoplasm of axilla and upper limb lymph nodes: Secondary | ICD-10-CM

## 2017-02-22 DIAGNOSIS — C50912 Malignant neoplasm of unspecified site of left female breast: Secondary | ICD-10-CM

## 2017-02-22 DIAGNOSIS — Z171 Estrogen receptor negative status [ER-]: Principal | ICD-10-CM

## 2017-02-23 ENCOUNTER — Ambulatory Visit (HOSPITAL_BASED_OUTPATIENT_CLINIC_OR_DEPARTMENT_OTHER): Payer: Medicare Other | Admitting: Oncology

## 2017-02-23 VITALS — BP 155/55 | HR 57 | Temp 98.1°F | Resp 18 | Ht 62.0 in | Wt 146.5 lb

## 2017-02-23 DIAGNOSIS — C50311 Malignant neoplasm of lower-inner quadrant of right female breast: Secondary | ICD-10-CM

## 2017-02-23 DIAGNOSIS — Z171 Estrogen receptor negative status [ER-]: Secondary | ICD-10-CM | POA: Diagnosis not present

## 2017-02-23 DIAGNOSIS — R918 Other nonspecific abnormal finding of lung field: Secondary | ICD-10-CM

## 2017-02-23 DIAGNOSIS — C773 Secondary and unspecified malignant neoplasm of axilla and upper limb lymph nodes: Secondary | ICD-10-CM | POA: Diagnosis not present

## 2017-02-23 NOTE — Progress Notes (Signed)
.  [facility]  [phone] [phone]     ID: Jennifer Garrett DOB: September 30, 1931  MR#: [id number]  JGG#:[id number]  Patient Care Team: Nolene Ebbs, MD as PCP - General (Internal Medicine) Fanny Skates, MD as Consulting Physician (General Surgery) Magrinat, Virgie Dad, MD as Consulting Physician (Oncology) Minus Breeding, MD as Consulting Physician (Cardiology) Irene Limbo, MD as Consulting Physician (Plastic Surgery) PCP: Nolene Ebbs, MD Chauncey Cruel, MD GYN: SU:  OTHER MD:  CHIEF COMPLAINT: Locally advanced triple negative breast cancer  CURRENT TREATMENT: Observation  INTERVAL HISTORY:  Jennifer Garrett returns today for follow-up of her triple negative breast cancer accompanied by her daughter and by her daughters nephew. Jennifer Garrett had a hypermetabolic left axillary lymph node noted on PET scan in February and yesterday ultrasound of the left axilla confirmed an enlarged and morphologically abnormal lymph node there measuring 2.9 cm. it has been difficult to schedule a biopsy but this was finally done yesterday, with final results pending.     REVIEW OF SYSTEMS: Jennifer Garrett tolerated the biopsy well. She is remarkably stable and has a very good quality of life overall. She still does some house work and takes care of her own activities of daily living. The bad problem is memory. The daughter is concerned that she has progressive dementia. The daughter does make sure the patient takes her medicines appropriately. However there is no significant pain, no unusual headaches, visual changes, nausea, vomiting, only minimal cough, which is nonproductive, no worsening shortness of breath, or pleurisy. Overall the patient's review of systems is benign except as noted   BREAST CANCER HISTORY: From the original intake note:  The patient presented to the emergency room 11/20/2015 with a complaint of a mass in the lower aspect of her right breast, which was painful  and bleeding. The patient stated the mass had been present approximately a month. An ultrasound of the breast was obtained which found a 4.4 cm mass in the right breast at the 6:00 position. This was read as most consistent with an abscess. The patient was started on clindamycin and referred to the [facility], where on 01/05/2016 she underwent bilateral diagnostic mammography with tomography and right  ultrasonography.   Mammography showed far posteriorly in the 5:00 region of the right breast a 4.1 cm mass with enlarged axillary adenopathy. On exam there was a fungating ulcerated bleeding mass in the right breast at the 5:00 position 7 cm from the nipple. The right axilla was "full" on palpation. Ultrasound showed a 5.4 cm mass at the 5:00 position of the right breast 7 cm from the nipple, with at least 3 enlarged abnormal axillary lymph nodes, the largest measuring 3.7 cm.  Biopsy of the right breast mass and one axillary lymph node on 01/06/2016 showed ([id number]) both to be positive for an invasive ductal carcinoma, grade 3, triple negative, with the HER-2 signals ratio between 1.21-1.40 and the number per cell 2.85-3.00. The MIB-1-2 was 70%.  The patient's subsequent history is as detailed below    PAST MEDICAL HISTORY: Past Medical History:  Diagnosis Date  . Arthritis   . Asthma    rarely uses neb  . Breast cancer ([facility]) 05/25/2016   right breast  . Breast cancer of lower-inner quadrant of right female breast ([facility]) 01/22/2016  . Carpal tunnel syndrome   . Coronary artery disease    DES to OM3 90% stenosis 2003, 90% small RCA stenosis.    . Diabetes mellitus   .  History of stomach ulcers   . Hyperlipemia   . Hypertension   . Irregular heart beat     PAST SURGICAL HISTORY: Past Surgical History:  Procedure Laterality Date  . ABDOMINAL HYSTERECTOMY    . CARDIAC CATHETERIZATION  2003   stent-  . CARPAL TUNNEL RELEASE  03/29/2012   Procedure: CARPAL TUNNEL RELEASE;  Surgeon:  Wynonia Sours, MD;  Location: [facility];  Service: Orthopedics;  Laterality: Left;  . EYE SURGERY     catracts  . LESION EXCISION WITH COMPLEX REPAIR Right 05/25/2016   Procedure: complex repair of 25cm wound;  Surgeon: Irene Limbo, MD;  Location: [facility];  Service: Plastics;  Laterality: Right;  . MASTECTOMY MODIFIED RADICAL Right 05/25/2016   Procedure: RIGHT MASTECTOMY MODIFIED RADICAL;  Surgeon: Fanny Skates, MD;  Location: [facility];  Service: General;  Laterality: Right;  . SHOULDER ARTHROSCOPY     right and left  . TRIGGER FINGER RELEASE  03/29/2012   Procedure: RELEASE TRIGGER FINGER/A-1 PULLEY;  Surgeon: Wynonia Sours, MD;  Location: [facility];  Service: Orthopedics;  Laterality: Left;    FAMILY HISTORY Family History  Problem Relation Age of Onset  . Heart disease Mother        No details   The patient's father died from "old age" at age 81. The patient's mother died at age 3 from complications of high blood pressure and diabetes. The patient had one brother, 4 sisters. There is no cancer in the family to her knowledge   GYNECOLOGIC HISTORY:  No LMP recorded. Patient has had a hysterectomy.  menarche age 45, first live birth age 30. The patient is GX P2. She underwent abdominal hysterectomy with bilateral salpingo-oophorectomy in her 27s. She did not undergo hormone replacement  SOCIAL HISTORY:  Ferris used to work for [employer]. She lives with her daughter Jennifer Garrett, who is Medical sales representative for [employer]. The patient's son Jennifer Garrett lives in [place]. The patient has 1 grandchild, no great-grandchildren. She attends a local [place] DIRECTIVES:  not in place. At the 01/22/2016 visit the patient was given the appropriate documents to complete. She tells me she intends to name her daughter Jennifer Garrett as her healthcare part of attorney    HEALTH MAINTENANCE: Social History  Substance Use Topics  .  Smoking status: Former Smoker    Quit date: 03/24/1978  . Smokeless tobacco: Never Used  . Alcohol use No     Colonoscopy: Never   PAP: status post hysterectomy   Bone density: Never    Allergies  Allergen Reactions  . Lyrica [Pregabalin] Swelling    SWELLING REACTION UNSPECIFIED   . Feldene [Piroxicam] Rash  . Lisinopril Other (See Comments)    States that it makes her "sensitive to light"  . Orudis [Ketoprofen] Rash  . Vibramycin [Doxycycline Calcium] Rash and Hives    Current Outpatient Prescriptions  Medication Sig Dispense Refill  . albuterol (PROVENTIL HFA;VENTOLIN HFA) 108 (90 BASE) MCG/ACT inhaler Inhale 1 puff into the lungs every 6 (six) hours as needed for wheezing or shortness of breath. Reported on 02/04/2016    . aspirin EC 81 MG tablet Take 1 tablet by mouth every morning.    Marland Kitchen atorvastatin (LIPITOR) 40 MG tablet Take 1 tablet (40 mg total) by mouth daily. (Patient taking differently: Take 40 mg by mouth daily at 6 PM. ) 90 tablet 3  . Blood Glucose Monitoring Suppl (ACCU-CHEK AVIVA  PLUS) w/Device KIT 1 Device by Does not apply route 2 (two) times daily. 1 kit 0  . calcium carbonate (OS-CAL) 600 MG TABS Take 600 mg by mouth 2 (two) times daily with a meal.    . cephALEXin (KEFLEX) 500 MG capsule Take 1 capsule (500 mg total) by mouth 2 (two) times daily. 14 capsule 0  . diclofenac sodium (VOLTAREN) 1 % GEL Apply 1 application topically every other day.    . donepezil (ARICEPT) 5 MG tablet Take 5 mg by mouth at bedtime.    . ferrous sulfate 325 (65 FE) MG tablet Take 1 tablet (325 mg total) by mouth 2 (two) times daily with a meal. 60 tablet 3  . glipiZIDE (GLUCOTROL) 10 MG tablet Take 10 mg by mouth daily as needed (for high blood sugar (greater than 150)).     Marland Kitchen glucose blood (ACCU-CHEK AVIVA) test strip Use as instructed 100 each 12  . hydrochlorothiazide (MICROZIDE) 12.5 MG capsule Take 12.5 mg by mouth daily.    Marland Kitchen KLOR-CON M20 20 MEQ tablet Take 20 mEq by mouth  daily.    . Lancets (ACCU-CHEK SOFT TOUCH) lancets Use as instructed 100 each 12  . metroNIDAZOLE (FLAGYL) 500 MG tablet CRUSH 1 TABLET DAILY TO USE ON OPEN WOUND 90 tablet 0  . naproxen sodium (ANAPROX) 220 MG tablet Take 220 mg by mouth 2 (two) times daily as needed (for pain.).    Marland Kitchen olmesartan (BENICAR) 40 MG tablet Take 40 mg by mouth daily.     No current facility-administered medications for this visit.     OBJECTIVE: Older African-American woman in no acute distress  Note that EPIC was down at the time of this visit. The patient's vitals will be entered separately.   There were no vitals filed for this visit.   There is no height or weight on file to calculate BMI.      ECOG FS:1 - Symptomatic but completely ambulatory  Sclerae unicteric, pupils round and equal Oropharynx clear and moist No cervical or supraclavicular adenopathy Lungs no rales or rhonchi Heart regular rate and rhythm Abd soft, nontender, positive bowel sounds MSK no focal spinal tenderness, no upper extremity lymphedema Neuro: nonfocal, well oriented, appropriate affect Breasts: The right breast is status post mastectomy and radiation. There is no evidence of chest wall recurrence. The left breast is entirely benign on exam. I do not palpate a well-defined mass in the left axilla. The right axilla is benign.  LAB RESULTS:  CMP     Component Value Date/Time   NA 137 12/13/2016 2227   NA 142 09/14/2016 0912   K 3.5 12/13/2016 2227   K 3.9 09/14/2016 0912   CL 105 12/13/2016 2227   CO2 25 12/13/2016 2227   CO2 28 09/14/2016 0912   GLUCOSE 115 (H) 12/13/2016 2227   GLUCOSE 152 (H) 09/14/2016 0912   BUN 9 12/13/2016 2227   BUN 17.9 09/14/2016 0912   CREATININE 0.82 12/13/2016 2227   CREATININE 1.1 09/14/2016 0912   CALCIUM 9.3 12/13/2016 2227   CALCIUM 9.9 09/14/2016 0912   PROT 7.7 09/14/2016 0912   ALBUMIN 3.7 09/14/2016 0912   AST 18 09/14/2016 0912   ALT 12 09/14/2016 0912   ALKPHOS 74  09/14/2016 0912   BILITOT 0.42 09/14/2016 0912   GFRNONAA >60 12/13/2016 2227   GFRAA >60 12/13/2016 2227    INo results found for: SPEP, UPEP  Lab Results  Component Value Date   WBC 5.3 12/13/2016  NEUTROABS 3.0 12/13/2016   HGB 10.2 (L) 12/13/2016   HCT 32.0 (L) 12/13/2016   MCV 84.7 12/13/2016   PLT 223 12/13/2016      Chemistry      Component Value Date/Time   NA 137 12/13/2016 2227   NA 142 09/14/2016 0912   K 3.5 12/13/2016 2227   K 3.9 09/14/2016 0912   CL 105 12/13/2016 2227   CO2 25 12/13/2016 2227   CO2 28 09/14/2016 0912   BUN 9 12/13/2016 2227   BUN 17.9 09/14/2016 0912   CREATININE 0.82 12/13/2016 2227   CREATININE 1.1 09/14/2016 0912      Component Value Date/Time   CALCIUM 9.3 12/13/2016 2227   CALCIUM 9.9 09/14/2016 0912   ALKPHOS 74 09/14/2016 0912   AST 18 09/14/2016 0912   ALT 12 09/14/2016 0912   BILITOT 0.42 09/14/2016 0912       No results found for: LABCA2  No components found for: LABCA125  No results for input(s): INR in the last 168 hours.  Urinalysis    Component Value Date/Time   COLORURINE YELLOW 05/19/2016 1138   APPEARANCEUR CLEAR 05/19/2016 1138   LABSPEC 1.017 05/19/2016 1138   PHURINE 5.5 05/19/2016 1138   GLUCOSEU NEGATIVE 05/19/2016 1138   HGBUR NEGATIVE 05/19/2016 1138   BILIRUBINUR NEGATIVE 05/19/2016 1138   KETONESUR NEGATIVE 05/19/2016 1138   PROTEINUR NEGATIVE 05/19/2016 1138   UROBILINOGEN 1.0 11/28/2013 0143   NITRITE NEGATIVE 05/19/2016 1138   LEUKOCYTESUR NEGATIVE 05/19/2016 1138     STUDIES: [place] Axillary Node Core Biopsy Left  Result Date: 02/22/2017 CLINICAL DATA:  83 year old female with history of known metastatic right breast cancer. PET-CT from February 2018 demonstrated a hypermetabolic 1 cm left axillary lymph node. The patient returns today for biopsy of the abnormal lymph node identified in the left axilla earlier today. EXAM: ULTRASOUND LEFT AXILLA ULTRASOUND GUIDED CORE NEEDLE BIOPSY OF A  LEFT AXILLARY NODE COMPARISON:  Previous exam(s). FINDINGS: Initially, and ultrasound of the left axilla was performed demonstrating an enlarged and morphologically abnormal lymph node measuring approximately 2.9 x 1.5 x 2.7 cm. This likely corresponds with the abnormal lymph node seen on PET scan from February 2018. No definite additional abnormal lymph nodes are identified in the left axilla. I met with the patient and we discussed the procedure of ultrasound-guided biopsy, including benefits and alternatives. We discussed the high likelihood of a successful procedure. We discussed the risks of the procedure, including infection, bleeding, tissue injury, clip migration, and inadequate sampling. Informed written consent was given. The usual time-out protocol was performed immediately prior to the procedure. Using sterile technique and 1% Lidocaine as local anesthetic, under direct ultrasound visualization, a 14 gauge spring-loaded device was used to perform biopsy of the abnormal lymph node in the left axilla using a lateral to medial approach. At the conclusion of the procedure a spiral shaped HydroMARK tissue marker clip was deployed into the biopsy cavity. Follow up 2 view mammogram was performed and dictated separately. IMPRESSION: Ultrasound guided biopsy of the abnormal an enlarged lymph node in the left axilla. No apparent complications. Electronically Signed   By: Everlean Alstrom M.D.   On: 02/22/2017 15:13   [place] Axilla Left  Result Date: 02/22/2017 CLINICAL DATA:  83 year old female with history of known metastatic right breast cancer. PET-CT from February 2018 demonstrated a hypermetabolic 1 cm left axillary lymph node. The patient returns today for biopsy of the abnormal lymph node identified in the left axilla earlier today. EXAM:  ULTRASOUND LEFT AXILLA ULTRASOUND GUIDED CORE NEEDLE BIOPSY OF A LEFT AXILLARY NODE COMPARISON:  Previous exam(s). FINDINGS: Initially, and ultrasound of the left axilla  was performed demonstrating an enlarged and morphologically abnormal lymph node measuring approximately 2.9 x 1.5 x 2.7 cm. This likely corresponds with the abnormal lymph node seen on PET scan from February 2018. No definite additional abnormal lymph nodes are identified in the left axilla. I met with the patient and we discussed the procedure of ultrasound-guided biopsy, including benefits and alternatives. We discussed the high likelihood of a successful procedure. We discussed the risks of the procedure, including infection, bleeding, tissue injury, clip migration, and inadequate sampling. Informed written consent was given. The usual time-out protocol was performed immediately prior to the procedure. Using sterile technique and 1% Lidocaine as local anesthetic, under direct ultrasound visualization, a 14 gauge spring-loaded device was used to perform biopsy of the abnormal lymph node in the left axilla using a lateral to medial approach. At the conclusion of the procedure a spiral shaped HydroMARK tissue marker clip was deployed into the biopsy cavity. Follow up 2 view mammogram was performed and dictated separately. IMPRESSION: Ultrasound guided biopsy of the abnormal an enlarged lymph node in the left axilla. No apparent complications. Electronically Signed   By: Everlean Alstrom M.D.   On: 02/22/2017 15:13   Mm Clip Placement Left  Result Date: 02/22/2017 CLINICAL DATA:  Post ultrasound-guided biopsy of an abnormal lymph node in the left axilla. EXAM: DIAGNOSTIC LEFT MAMMOGRAM POST ULTRASOUND BIOPSY COMPARISON:  Previous exam(s). FINDINGS: Mammographic images were obtained following ultrasound guided biopsy of an abnormal lymph node in the left axilla. Only a small portion of the lymph node could be included in the field of view due to the far posterosuperior location and therefore the [facility] biopsy marking clip was not identified. IMPRESSION: Nonvisualization of HydroMARK biopsy marking clip as only a  small portion of the lymph node could be included in field of view due to far posterosuperior location. Final Assessment: Post Procedure Mammograms for Marker Placement Electronically Signed   By: Everlean Alstrom M.D.   On: 02/22/2017 15:41     ASSESSMENT: 83 y.o. [place] woman status post right breast lower outer quadrant and right axillary lymph node biopsy 01/06/2016 both positive for a clinical T4 N1 M1, stage IV invasive ductal carcinoma, grade 3, triple negative, with an MIB-1 of 70%  (a) CT scan of the chest and bone scan 01/27/2016 showed scattered pulmonary nodules in addition to locally advanced disease  (b) repeat CT scan of the chest 04/12/2016 shows persistent pulmonary nodules, progression in local disease  (c) CA-27-29 is noninformative  (1) neoadjuvant chemotherapy consisting of capecitabine, 1500 mg twice daily, 7 days on 7 days off, starting 01/27/2016, discontinued 04/14/2016 with progression  (2) right modified radical mastectomy 05/25/2016 showed a pT3 pN2, stage IIIA invasive ductal carcinoma, grade 3, with negative margins.  (3) adjuvant radiation 08/24/16- 10/07/16 Site/dose:   1) Right chest wall / 50.4 Gy in 28 fractions                         2) Right chest wall boost / 10 Gy in 5 fractions  (4) restaging studies and follow-up:  (a) PET scan 08/24/2016 was negative except for a 1.0 cm LEFT axillary lymph node  (b)  biopsy of the left axillary lymph node 02/22/2017, pending results   PLAN: Vlada is doing remarkably well postoperatively and aside from the  memory problems is doing terrific for her age. She herself has no complaints.  She has lost some weight and is not eating very poorly though she still keeps a taste for sweet's. The daughter tells me they had to stop the Glucotrol he can is a very low readings and Genene has essentially normal numbers on her sugar when tested on no treatment at present.  The results of the left axillary mass biopsy should be  ready by next week. I will call her with those results. It could be that she has a different cancer on that side, but this is not cancer related, or that it is cancer but it is not breast cancer.   Tentatively I am making her a return appointment with me in approximately 3 months, but this will depend on the biopsy results. Certainly if we have any estrogen receptor expression she will be started on anastrozole. Otherwise a watch and wait approach may be more appropriate given the daughter's reports regarding progressive dementia  As far as that is concerned I did suggest that the daughter read "to 36 hour day", which will be helpful to them anticipating possible further deterioration of the patient  They know to call for any other issues that may develop before the next visit here.  Chauncey Cruel, MD   02/23/2017 8:26 PM Medical Oncology and Hematology [facility] [address] [place], [place] [address] Tel. [phone]    Fax. [phone]

## 2017-02-28 ENCOUNTER — Other Ambulatory Visit: Payer: Self-pay | Admitting: *Deleted

## 2017-03-01 ENCOUNTER — Other Ambulatory Visit: Payer: Self-pay | Admitting: Oncology

## 2017-03-01 DIAGNOSIS — C50311 Malignant neoplasm of lower-inner quadrant of right female breast: Secondary | ICD-10-CM

## 2017-03-01 DIAGNOSIS — C7800 Secondary malignant neoplasm of unspecified lung: Secondary | ICD-10-CM

## 2017-03-01 DIAGNOSIS — Z171 Estrogen receptor negative status [ER-]: Principal | ICD-10-CM

## 2017-03-01 NOTE — Progress Notes (Unsigned)
The biopsy report on Jennifer Garrett ([id number]) shows metastatic carcinoma with patchy weak staining for the estrogen receptor. I have requested a prognostic panel.

## 2017-03-04 ENCOUNTER — Other Ambulatory Visit: Payer: Self-pay | Admitting: *Deleted

## 2017-03-04 DIAGNOSIS — Z171 Estrogen receptor negative status [ER-]: Principal | ICD-10-CM

## 2017-03-04 DIAGNOSIS — C50311 Malignant neoplasm of lower-inner quadrant of right female breast: Secondary | ICD-10-CM

## 2017-03-07 ENCOUNTER — Telehealth: Payer: Self-pay | Admitting: Oncology

## 2017-03-07 ENCOUNTER — Other Ambulatory Visit: Payer: Self-pay | Admitting: Oncology

## 2017-03-07 NOTE — Progress Notes (Unsigned)
I called the patient's daughter and reviewed the pathology results with her. This shows triple negative disease again. This means we don't have a simple treatment option.  The 3 ways to go about this are 1 try chemotherapy; the daughter feels this was not helpful before and it certainly would affect her mother's quality of life; 2 consider surgery for local control and 3 observation alone.  After much discussion we decided that we would set the patient opted see Dr. Dalbert Batman her surgeon for consideration of surgical resection of the active disease in the axilla plus or minus breast. They will then return to see me in a few weeks  They know to call if any other issue develops before that

## 2017-03-07 NOTE — Telephone Encounter (Signed)
Called patient and they did not have a voicemail set up.   Sending them a reminder letter for their appts in October.

## 2017-03-08 ENCOUNTER — Other Ambulatory Visit: Payer: Self-pay | Admitting: General Surgery

## 2017-03-10 ENCOUNTER — Other Ambulatory Visit: Payer: Medicare Other

## 2017-03-11 ENCOUNTER — Telehealth: Payer: Self-pay | Admitting: Cardiology

## 2017-03-11 ENCOUNTER — Telehealth: Payer: Self-pay | Admitting: *Deleted

## 2017-03-11 NOTE — Telephone Encounter (Signed)
Returning your  Call

## 2017-03-11 NOTE — Telephone Encounter (Signed)
Call and leave a message for pt to call back to schedule appt for surgery clearance with Dr Percival Spanish.

## 2017-03-15 ENCOUNTER — Other Ambulatory Visit: Payer: Self-pay | Admitting: Oncology

## 2017-03-15 ENCOUNTER — Ambulatory Visit: Payer: Medicare Other

## 2017-03-15 ENCOUNTER — Ambulatory Visit
Admission: RE | Admit: 2017-03-15 | Discharge: 2017-03-15 | Disposition: A | Discharge status: Home / Self Care | Payer: Medicare Other | Source: Ambulatory Visit | Attending: Oncology | Admitting: Oncology

## 2017-03-15 DIAGNOSIS — C50311 Malignant neoplasm of lower-inner quadrant of right female breast: Secondary | ICD-10-CM

## 2017-03-15 DIAGNOSIS — Z171 Estrogen receptor negative status [ER-]: Principal | ICD-10-CM

## 2017-03-15 DIAGNOSIS — C50912 Malignant neoplasm of unspecified site of left female breast: Secondary | ICD-10-CM

## 2017-03-15 DIAGNOSIS — C773 Secondary and unspecified malignant neoplasm of axilla and upper limb lymph nodes: Secondary | ICD-10-CM

## 2017-03-15 MED ORDER — GADOBENATE DIMEGLUMINE 529 MG/ML IV SOLN
15.0000 mL | Freq: Once | INTRAVENOUS | Status: AC | PRN
  Administered 2017-03-15: 15 mL via INTRAVENOUS

## 2017-03-15 MED ORDER — GADOBENATE DIMEGLUMINE 529 MG/ML IV SOLN: 14.0000 mL | Freq: Once | INTRAVENOUS | Status: DC | PRN

## 2017-03-15 NOTE — Telephone Encounter (Signed)
Appt schedule for 8/30 @ 4pm

## 2017-03-16 NOTE — Progress Notes (Signed)
Cardiology Office Note   Date:  03/18/2017   ID:  Jennifer Garrett, DOB 01-08-1932, MRN [id number]  PCP:  Nolene Ebbs, MD  Cardiologist:   Minus Breeding, MD   Chief Complaint  Patient presents with  . Pre-op Exam      History of Present Illness: Jennifer Garrett is a 83 y.o. female who presented for preoperative evaluation prior to breast surgery for newly diagnosed cancer.   She had a cath in  2003 that demonstrated a cardiac cath with 80% small right coronary artery narrowing, 20-30% hazy proximal LAD stenosis and a 90% OM 3 stenosis. This was treated with a Cypher stent.   I sent her for a Lexiscan Myoview and this was negative for ischemia in 2017.   She is going to have lymph node dissection.  Since I last saw her she has done well.  She has no cardiovascular complaints.  The patient denies any new symptoms such as chest discomfort, neck or arm discomfort. There has been no new shortness of breath, PND or orthopnea. There have been no reported palpitations, presyncope or syncope.  She likes to work in the yard.    Past Medical History:  Diagnosis Date  . Arthritis   . Asthma    rarely uses neb  . Breast cancer ([place]) 05/25/2016   right breast  . Breast cancer of lower-inner quadrant of right female breast ([place]) 01/22/2016  . Carpal tunnel syndrome   . Coronary artery disease    DES to OM3 90% stenosis 2003, 90% small RCA stenosis.    . Diabetes mellitus   . History of stomach ulcers   . Hyperlipemia   . Hypertension   . Irregular heart beat     Past Surgical History:  Procedure Laterality Date  . ABDOMINAL HYSTERECTOMY    . CARDIAC CATHETERIZATION  2003   stent-  . CARPAL TUNNEL RELEASE  03/29/2012   Procedure: CARPAL TUNNEL RELEASE;  Surgeon: Wynonia Sours, MD;  Location: [place];  Service: Orthopedics;  Laterality: Left;  . EYE SURGERY     catracts  . LESION EXCISION WITH COMPLEX REPAIR Right 05/25/2016   Procedure: complex repair of 25cm wound;   Surgeon: Irene Limbo, MD;  Location: [place];  Service: Plastics;  Laterality: Right;  . MASTECTOMY     right  . MASTECTOMY MODIFIED RADICAL Right 05/25/2016   Procedure: RIGHT MASTECTOMY MODIFIED RADICAL;  Surgeon: Fanny Skates, MD;  Location: [place];  Service: General;  Laterality: Right;  . SHOULDER ARTHROSCOPY     right and left  . TRIGGER FINGER RELEASE  03/29/2012   Procedure: RELEASE TRIGGER FINGER/A-1 PULLEY;  Surgeon: Wynonia Sours, MD;  Location: [place];  Service: Orthopedics;  Laterality: Left;     Current Outpatient Prescriptions  Medication Sig Dispense Refill  . albuterol (PROVENTIL HFA;VENTOLIN HFA) 108 (90 BASE) MCG/ACT inhaler Inhale 1 puff into the lungs every 6 (six) hours as needed for wheezing or shortness of breath. Reported on 02/04/2016    . aspirin EC 81 MG tablet Take 1 tablet by mouth every morning.    . Blood Glucose Monitoring Suppl (ACCU-CHEK AVIVA PLUS) w/Device KIT 1 Device by Does not apply route 2 (two) times daily. 1 kit 0  . calcium carbonate (OS-CAL) 600 MG TABS Take 600 mg by mouth 2 (two) times daily with a meal.    . cephALEXin (KEFLEX) 500 MG capsule Take 1 capsule (500 mg total) by mouth  2 (two) times daily. 14 capsule 0  . diclofenac sodium (VOLTAREN) 1 % GEL Apply 1 application topically every other day.    . donepezil (ARICEPT) 5 MG tablet Take 5 mg by mouth at bedtime.    . ferrous sulfate 325 (65 FE) MG tablet Take 1 tablet (325 mg total) by mouth 2 (two) times daily with a meal. 60 tablet 3  . glipiZIDE (GLUCOTROL) 10 MG tablet Take 10 mg by mouth daily as needed (for high blood sugar (greater than 150)).     Marland Kitchen glucose blood (ACCU-CHEK AVIVA) test strip Use as instructed 100 each 12  . hydrochlorothiazide (MICROZIDE) 12.5 MG capsule Take 12.5 mg by mouth daily.    Marland Kitchen KLOR-CON M20 20 MEQ tablet Take 20 mEq by mouth daily.    . Lancets (ACCU-CHEK SOFT TOUCH) lancets Use as instructed 100 each 12  . metroNIDAZOLE (FLAGYL) 500  MG tablet CRUSH 1 TABLET DAILY TO USE ON OPEN WOUND 90 tablet 0  . naproxen sodium (ANAPROX) 220 MG tablet Take 220 mg by mouth 2 (two) times daily as needed (for pain.).    Marland Kitchen olmesartan (BENICAR) 40 MG tablet Take 40 mg by mouth daily.    Marland Kitchen atorvastatin (LIPITOR) 40 MG tablet Take 1 tablet (40 mg total) by mouth daily. (Patient taking differently: Take 40 mg by mouth daily at 6 PM. ) 90 tablet 3   No current facility-administered medications for this visit.     Allergies:   Lyrica [pregabalin]; Feldene [piroxicam]; Lisinopril; Orudis [ketoprofen]; and Vibramycin [doxycycline calcium]    ROS:  Please see the history of present illness.   Otherwise, review of systems are positive for none.   All other systems are reviewed and negative.    PHYSICAL EXAM: VS:  BP 132/62   Pulse (!) 57   Ht _0  (1.575 m)   Wt 149 lb (67.6 kg)   BMI 27.25 kg/m  , BMI Body mass index is 27.25 kg/m.  GENERAL:  Well appearing NECK:  No jugular venous distention, waveform within normal limits, carotid upstroke brisk and symmetric, left carotid bruit, no thyromegaly LUNGS:  Clear to auscultation bilaterally BACK:  No CVA tenderness CHEST:  Right mastectomy HEART:  PMI not displaced or sustained,S1 and S2 within normal limits, no S3, no S4, no clicks, no rubs, right upper sternal border brief systolic murmur, no diastolic murmurs ABD:  Flat, positive bowel sounds normal in frequency in pitch, no bruits, no rebound, no guarding, no midline pulsatile mass, no hepatomegaly, no splenomegaly EXT:  2 plus pulses throughout, no edema, no cyanosis no clubbing SKIN:  No rashes no nodules   EKG:  EKG is not  ordered today.     Recent Labs: 09/14/2016: ALT 12 12/13/2016: BUN 9; Creatinine, Ser 0.82; Hemoglobin 10.2; Platelets 223; Potassium 3.5; Sodium 137    Lipid Panel No results found for: CHOL, TRIG, HDL, CHOLHDL, VLDL, LDLCALC, LDLDIRECT    Wt Readings from Last 3 Encounters:  03/17/17 149 lb (67.6 kg)   02/23/17 146 lb 8 oz (66.5 kg)  01/21/17 147 lb (66.7 kg)      Other studies Reviewed: Additional studies/ records that were reviewed today include: None Review of the above records demonstrates:      ASSESSMENT AND PLAN:   CAD:   She had a low risk nuclear study last year.  She has no new symptoms.  No further testing is indicated.    PREOP:  She has no symptoms since the last  stress test.  Therefore, based on ACC/AHA guidelines, the patient would be at acceptable risk for the planned procedure without further cardiovascular testing.  HTN:  The blood pressure is at target. No change in medications is indicated. We will continue with therapeutic lifestyle changes (TLC).  DYSLIPIDEMIA:   I switched her to Lipitor last year.  She is going to see Nolene Ebbs, MD and get lipids.  The goal will be an LDL less than 70.   BRUIT:  She will have Dopplers.  Current medicines are reviewed at length with the patient today.  The patient does not have concerns regarding medicines.  The following changes have been made:  None  Labs/ tests ordered today include:    No orders of the defined types were placed in this encounter.    Disposition:   FU with me in 12 months.     Signed, Minus Breeding, MD  03/18/2017 2:11 PM    [facility]

## 2017-03-17 ENCOUNTER — Encounter: Payer: Self-pay | Admitting: Cardiology

## 2017-03-17 ENCOUNTER — Ambulatory Visit (INDEPENDENT_AMBULATORY_CARE_PROVIDER_SITE_OTHER): Payer: Medicare Other | Admitting: Cardiology

## 2017-03-17 VITALS — BP 132/62 | HR 57 | Ht 62.0 in | Wt 149.0 lb

## 2017-03-17 DIAGNOSIS — I10 Essential (primary) hypertension: Secondary | ICD-10-CM | POA: Diagnosis not present

## 2017-03-17 DIAGNOSIS — Z01810 Encounter for preprocedural cardiovascular examination: Secondary | ICD-10-CM

## 2017-03-17 DIAGNOSIS — E785 Hyperlipidemia, unspecified: Secondary | ICD-10-CM | POA: Diagnosis not present

## 2017-03-17 DIAGNOSIS — R0989 Other specified symptoms and signs involving the circulatory and respiratory systems: Secondary | ICD-10-CM

## 2017-03-17 DIAGNOSIS — I2510 Atherosclerotic heart disease of native coronary artery without angina pectoris: Secondary | ICD-10-CM

## 2017-03-17 NOTE — Patient Instructions (Signed)
Medication Instructions:  Continue current medications  If you need a refill on your cardiac medications before your next appointment, please call your pharmacy.  Labwork: None Ordered   Testing/Procedures: Your physician has requested that you have a carotid duplex. This test is an ultrasound of the carotid arteries in your neck. It looks at blood flow through these arteries that supply the brain with blood. Allow one hour for this exam. There are no restrictions or special instructions.  Follow-Up: Your physician wants you to follow-up in: 1 Year. You should receive a reminder letter in the mail two months in advance. If you do not receive a letter, please call our office [phone].    Thank you for choosing [facility]!!      

## 2017-03-18 ENCOUNTER — Encounter: Payer: Self-pay | Admitting: Cardiology

## 2017-03-18 DIAGNOSIS — I10 Essential (primary) hypertension: Secondary | ICD-10-CM | POA: Insufficient documentation

## 2017-03-18 DIAGNOSIS — R0989 Other specified symptoms and signs involving the circulatory and respiratory systems: Secondary | ICD-10-CM | POA: Insufficient documentation

## 2017-03-18 DIAGNOSIS — E785 Hyperlipidemia, unspecified: Secondary | ICD-10-CM | POA: Insufficient documentation

## 2017-03-18 DIAGNOSIS — I2510 Atherosclerotic heart disease of native coronary artery without angina pectoris: Secondary | ICD-10-CM | POA: Insufficient documentation

## 2017-03-18 DIAGNOSIS — Z01810 Encounter for preprocedural cardiovascular examination: Secondary | ICD-10-CM | POA: Insufficient documentation

## 2017-03-22 ENCOUNTER — Other Ambulatory Visit: Payer: Self-pay | Admitting: General Surgery

## 2017-03-22 DIAGNOSIS — C50912 Malignant neoplasm of unspecified site of left female breast: Secondary | ICD-10-CM

## 2017-03-22 DIAGNOSIS — C773 Secondary and unspecified malignant neoplasm of axilla and upper limb lymph nodes: Principal | ICD-10-CM

## 2017-03-23 ENCOUNTER — Other Ambulatory Visit: Payer: Self-pay | Admitting: General Surgery

## 2017-03-23 ENCOUNTER — Other Ambulatory Visit: Payer: Medicare Other

## 2017-03-23 DIAGNOSIS — C50912 Malignant neoplasm of unspecified site of left female breast: Secondary | ICD-10-CM

## 2017-03-23 DIAGNOSIS — C773 Secondary and unspecified malignant neoplasm of axilla and upper limb lymph nodes: Principal | ICD-10-CM

## 2017-03-29 ENCOUNTER — Ambulatory Visit (HOSPITAL_COMMUNITY)
Admission: RE | Admit: 2017-03-29 | Discharge: 2017-03-29 | Disposition: A | Discharge status: Home / Self Care | Payer: Medicare Other | Source: Ambulatory Visit | Attending: Cardiovascular Disease | Admitting: Cardiovascular Disease

## 2017-03-29 ENCOUNTER — Encounter (HOSPITAL_BASED_OUTPATIENT_CLINIC_OR_DEPARTMENT_OTHER): Payer: Self-pay | Admitting: *Deleted

## 2017-03-29 DIAGNOSIS — Z8673 Personal history of transient ischemic attack (TIA), and cerebral infarction without residual deficits: Secondary | ICD-10-CM | POA: Insufficient documentation

## 2017-03-29 DIAGNOSIS — I2510 Atherosclerotic heart disease of native coronary artery without angina pectoris: Secondary | ICD-10-CM | POA: Insufficient documentation

## 2017-03-29 DIAGNOSIS — Z87891 Personal history of nicotine dependence: Secondary | ICD-10-CM | POA: Insufficient documentation

## 2017-03-29 DIAGNOSIS — I10 Essential (primary) hypertension: Secondary | ICD-10-CM | POA: Diagnosis not present

## 2017-03-29 DIAGNOSIS — I6523 Occlusion and stenosis of bilateral carotid arteries: Secondary | ICD-10-CM | POA: Diagnosis not present

## 2017-03-29 DIAGNOSIS — E785 Hyperlipidemia, unspecified: Secondary | ICD-10-CM | POA: Insufficient documentation

## 2017-03-29 DIAGNOSIS — R0989 Other specified symptoms and signs involving the circulatory and respiratory systems: Secondary | ICD-10-CM | POA: Insufficient documentation

## 2017-03-29 DIAGNOSIS — E119 Type 2 diabetes mellitus without complications: Secondary | ICD-10-CM | POA: Diagnosis not present

## 2017-03-30 ENCOUNTER — Encounter (HOSPITAL_BASED_OUTPATIENT_CLINIC_OR_DEPARTMENT_OTHER)
Admission: RE | Admit: 2017-03-30 | Discharge: 2017-03-30 | Disposition: A | Discharge status: Home / Self Care | Payer: Medicare Other | Source: Ambulatory Visit | Attending: General Surgery | Admitting: General Surgery

## 2017-03-30 DIAGNOSIS — Z01812 Encounter for preprocedural laboratory examination: Secondary | ICD-10-CM | POA: Diagnosis not present

## 2017-03-30 DIAGNOSIS — E785 Hyperlipidemia, unspecified: Secondary | ICD-10-CM | POA: Insufficient documentation

## 2017-03-30 DIAGNOSIS — C7800 Secondary malignant neoplasm of unspecified lung: Secondary | ICD-10-CM | POA: Insufficient documentation

## 2017-03-30 DIAGNOSIS — C50311 Malignant neoplasm of lower-inner quadrant of right female breast: Secondary | ICD-10-CM | POA: Insufficient documentation

## 2017-03-30 DIAGNOSIS — Z171 Estrogen receptor negative status [ER-]: Secondary | ICD-10-CM | POA: Diagnosis not present

## 2017-03-30 DIAGNOSIS — I10 Essential (primary) hypertension: Secondary | ICD-10-CM | POA: Insufficient documentation

## 2017-03-30 DIAGNOSIS — I2510 Atherosclerotic heart disease of native coronary artery without angina pectoris: Secondary | ICD-10-CM | POA: Insufficient documentation

## 2017-03-30 LAB — CBC WITH DIFFERENTIAL/PLATELET
BASOS PCT: 1 %
Basophils Absolute: 0.1 10*3/uL (ref 0.0–0.1)
Eosinophils Absolute: 0.2 10*3/uL (ref 0.0–0.7)
Eosinophils Relative: 4 %
HEMATOCRIT: 34 % — AB (ref 36.0–46.0)
HEMOGLOBIN: 10.6 g/dL — AB (ref 12.0–15.0)
LYMPHS ABS: 1.3 10*3/uL (ref 0.7–4.0)
Lymphocytes Relative: 26 %
MCH: 26.8 pg (ref 26.0–34.0)
MCHC: 31.2 g/dL (ref 30.0–36.0)
MCV: 86.1 fL (ref 78.0–100.0)
Monocytes Absolute: 0.7 10*3/uL (ref 0.1–1.0)
Monocytes Relative: 14 %
NEUTROS ABS: 2.8 10*3/uL (ref 1.7–7.7)
NEUTROS PCT: 55 %
Platelets: 208 10*3/uL (ref 150–400)
RBC: 3.95 MIL/uL (ref 3.87–5.11)
RDW: 13.5 % (ref 11.5–15.5)
WBC: 5.1 10*3/uL (ref 4.0–10.5)

## 2017-03-30 LAB — BASIC METABOLIC PANEL
Anion gap: 6 (ref 5–15)
BUN: 12 mg/dL (ref 6–20)
CALCIUM: 9 mg/dL (ref 8.9–10.3)
CO2: 25 mmol/L (ref 22–32)
CREATININE: 0.99 mg/dL (ref 0.44–1.00)
Chloride: 109 mmol/L (ref 101–111)
GFR calc non Af Amer: 52 mL/min — ABNORMAL LOW (ref >60)
GFR, EST AFRICAN AMERICAN: 60 mL/min — AB (ref >60)
Glucose, Bld: 97 mg/dL (ref 65–99)
POTASSIUM: 4.2 mmol/L (ref 3.5–5.1)
Sodium: 140 mmol/L (ref 135–145)

## 2017-03-30 NOTE — Progress Notes (Signed)
Ensure pre surgery drink given with instructions to complete by 0400 dos, pt verbalized understanding. 

## 2017-03-31 ENCOUNTER — Other Ambulatory Visit: Payer: Self-pay | Admitting: General Surgery

## 2017-03-31 ENCOUNTER — Ambulatory Visit
Admission: RE | Admit: 2017-03-31 | Discharge: 2017-03-31 | Disposition: A | Discharge status: Home / Self Care | Payer: Medicare Other | Source: Ambulatory Visit | Attending: General Surgery | Admitting: General Surgery

## 2017-03-31 DIAGNOSIS — C50912 Malignant neoplasm of unspecified site of left female breast: Secondary | ICD-10-CM

## 2017-03-31 DIAGNOSIS — C773 Secondary and unspecified malignant neoplasm of axilla and upper limb lymph nodes: Principal | ICD-10-CM

## 2017-04-03 NOTE — Anesthesia Preprocedure Evaluation (Addendum)
Anesthesia Evaluation  Patient identified by MRN, date of birth, ID band Patient awake    Reviewed: Allergy & Precautions, NPO status , Patient's Chart, lab work & pertinent test results  History of Anesthesia Complications Negative for: history of anesthetic complications  Airway Mallampati: II  TM Distance: >3 FB Neck ROM: Full    Dental  (+) Edentulous Upper, Edentulous Lower, Dental Advisory Given   Pulmonary asthma , former smoker,    Pulmonary exam normal        Cardiovascular hypertension, Pt. on medications + CAD and + Cardiac Stents  Normal cardiovascular exam     Neuro/Psych Left weakness, memory loss CVA, Residual Symptoms    GI/Hepatic negative GI ROS, Neg liver ROS,   Endo/Other  diabetes, Type 2, Oral Hypoglycemic Agents  Renal/GU negative Renal ROS     Musculoskeletal  (+) Arthritis ,   Abdominal   Peds  Hematology negative hematology ROS (+)   Anesthesia Other Findings   Reproductive/Obstetrics                            Anesthesia Physical  Anesthesia Plan  ASA: III  Anesthesia Plan: General   Post-op Pain Management:    Induction: Intravenous  PONV Risk Score and Plan: 3 and Ondansetron, Dexamethasone and Scopolamine patch - Pre-op  Airway Management Planned: LMA  Additional Equipment: None  Intra-op Plan:   Post-operative Plan: Extubation in OR  Informed Consent: I have reviewed the patients History and Physical, chart, labs and discussed the procedure including the risks, benefits and alternatives for the proposed anesthesia with the patient or authorized representative who has indicated his/her understanding and acceptance.   Dental advisory given  Plan Discussed with: CRNA, Anesthesiologist and Surgeon  Anesthesia Plan Comments:       Anesthesia Quick Evaluation

## 2017-04-03 NOTE — H&P (Signed)
Jennifer Garrett Location: [facility] Patient #: [id number] DOB: 12-25-1931 Widowed / Language: Jennifer Garrett / Race: Black or African American Female      History of Present Illness  The patient is a 83 year old female who presents with a complaint of metastatic breast cancer left axilla. This is a 83 year old female, referred back to me by Dr. Jana Hakim because of metastatic breast cancer in the left axilla is triple negative. Consideration is to be given to excision of her left axillary mass for local control. Dr. [name] is her PCP. Dr. Cy Blamer listed as her cardiologist.  She presented with locally advanced cancer of her right breast back in July 2017. MRI showed locally advanced disease on the right but there was no disease of the left breast or the left axilla. Image guided biopsy on the right side showed invasive ductal carcinoma, triple negative breast cancer. She did not respond to chemotherapy. On May 25, 2016 she underwent extended right modified radical mastectomy with complex primary wound closure. Dr. Raelene Bott was involved. She healed that wound and there has not been any local recurrence. She had radiation therapy to the right chest wall.  Final pathology on the right showed 7.7 cm invasive duct carcinoma. Resection margins were negative at 6 out of 21 lymph nodes were positive with extracapsular extension. She has small pulmonary nodules but they have not progressed. She is followed closely by Dr. Jana Hakim.  PET scan earlier this year showed increased uptake in the left axilla. Other staging workup shows no disease in the brain or bones. Small pulmonary nodules are stable on his chest CT.  On February 22, 2017 she underwent image guided biopsy of left axillary lymph node which was obvious and palpable and this shows metastatic carcinoma consistent with invasive ductal carcinoma, also triple negative breast cancer. There is no known disease within the  left breast but MRI is scheduled on September 5. We are trying to move that up. Dr. Jana Hakim has referred her for excision of the left axillary mass for local control  Comorbidities include memory disorder, hypertension. Non-insulin-dependent diabetes. Asthma. She had CVA last year. Did not have to be hospitalized. History coronary artery disease and has had cardiac catheterization and angioplasty and stent in the past. She takes aspirin only.  Socially she lives with her daughter who is here with her today. Does not drive. Denies alcohol or tobacco. Family history is negative for breast, ovarian, pancreatic, or colon cancer  I had a long talk with the patient and her daughter. The patient has very poor insight and memory. The daughter has good insight and wants [place] to do whatever is appropriate. She will be referred to cardiology for urgent cardiac clearance She will be scheduled for excision deep left axillary mass. This is obvious and palpable and I do not think we need to put her through radioactive seed localization technique.  We will need to wait for the breast MRI to do the surgery. Hopefully we can expedite both the surgery and the MRI. Hopefully we will not see any disease in the breast and we can simply perform the axillary surgery If we do see disease in the left breast we will need to reconsider her treatment plan.  I discussed the indications, details, techniques, and risk of surgery with the patient and her daughter. They're aware the risks of bleeding, infection, arm swelling, arm numbness, shoulder disability, deterioration of neurologic function, drain placement and recurrence of cancer. They understand these issues  well. All other questions were answered. They agree with this plan.   Addendum Note Breast MRI was performed on August 28.. There is no enhancing mass or signs of malignancy within the left breast Again, they comment on nonenhancing skin  thickening. The radiologist differential is edema versus inflammatory breast cancer. Left axillary mass is again noted  Consideration will be given to punch biopsy of the left breast skin, however I am not sure that this is indicated since my last exam notes no skin changes. Plan: reexamine left breast skin preop the morning of surgery  Addendum Note Her case was presented in breast conference on September 5. Dr. Lisbeth Renshaw and Dr. Jana Hakim want a skin punch biopsy if there is anything abnormal with the skin as that may influence radiation therapy I will reexamine the patient the morning of surgery and add skin punch biopsy to her permit if indicated.   Allergies  Doxycycline *DERMATOLOGICALS*  Ketoprofen *CHEMICALS*  Lisinopril *CHEMICALS*  Piroxicam *ANALGESICS - ANTI-INFLAMMATORY*  Pregabalin *CHEMICALS*  Allergies Reconciled   Medication History  Iron (325 (65 Fe)MG Tablet, Oral two times daily) Active. HydroCHLOROthiazide (12.5MG  Capsule, Oral) Active. Simvastatin (20MG  Tablet, Oral daily) Active. Voltaren (1% Gel, Transdermal daily) Active. Aspirin (81MG  Tablet, Oral daily) Active. Calcium 600 (1500 (600 Ca)MG Tablet, Oral daily) Active. Albuterol Sulfate HFA (108MCG/ACT Aerosol Soln, Inhalation daily) Active. Benicar (40MG  Tablet, Oral daily) Active. Clindamycin HCl (300MG  Capsule, Oral daily) Active. GlipiZIDE (10MG  Tablet, Oral daily) Active. Januvia (100MG  Tablet, Oral daily) Active. Medications Reconciled  Vitals  Weight: 145 lb Height: 60.5in Body Surface Area: 1.64 m Body Mass Index: 27.85 kg/m  Temp.: 34F  Pulse: 67 (Regular)  BP: 118/72 (Sitting, Left Arm, Standard)   Physical Exam General Mental Status-Alert. General Appearance-Consistent with stated age. Hydration-Well hydrated. Voice-Normal.  Head and Neck Head-normocephalic, atraumatic with no lesions or palpable masses. Trachea-midline. Thyroid Gland  Characteristics - normal size and consistency.  Eye Eyeball - Bilateral-Extraocular movements intact. Sclera/Conjunctiva - Bilateral-No scleral icterus.  Chest and Lung Exam Chest and lung exam reveals -quiet, even and easy respiratory effort with no use of accessory muscles and on auscultation, normal breath sounds, no adventitious sounds and normal vocal resonance. Inspection Chest Wall - Normal. Back - normal.  Breast Note: Right mastectomy wound well healed. Obvious pending from radiation therapy. No palpable nodules or ulceration. No axillary mass. No evidence of cancer recurrence on the right side. On the left side of the left breast is atrophic but doesn't have any skin change or palpable mass. There is a 3 cm or less obvious palpable mobile nontender mass mass in the left axilla. This is quite obviously the dominant process. There may be some other tiny nodules.   Cardiovascular Cardiovascular examination reveals -normal heart sounds, regular rate and rhythm with no murmurs and normal pedal pulses bilaterally.  Abdomen Inspection Inspection of the abdomen reveals - No Hernias. Skin - Scar - no surgical scars. Palpation/Percussion Palpation and Percussion of the abdomen reveal - Soft, Non Tender, No Rebound tenderness, No Rigidity (guarding) and No hepatosplenomegaly. Auscultation Auscultation of the abdomen reveals - Bowel sounds normal.  Peripheral Vascular Note: Palpable radial and femoral pulses bilaterally.   Neurologic Neurologic evaluation reveals -alert and oriented x 3 with no impairment of recent or remote memory. Mental Status-Normal.  Neuropsychiatric Note: Awake and alert. Very poor insight. Oriented to person but not to place or situation. Does not understand her diagnosis. Ambulatory.   Musculoskeletal Normal Exam - Left-Upper Extremity Strength Normal and  Lower Extremity Strength Normal. Normal Exam - Right-Upper Extremity Strength  Normal and Lower Extremity Strength Normal.  Lymphatic Head & Neck  General Head & Neck Lymphatics: Bilateral - Description - Normal. Axillary  General Axillary Region: Bilateral - Description - Normal. Tenderness - Non Tender. Femoral & Inguinal  Generalized Femoral & Inguinal Lymphatics: Bilateral - Description - Normal. Tenderness - Non Tender.    Assessment & Plan  PRIMARY MALIGNANT NEOPLASM OF RIGHT BREAST WITH METASTASIS TO MOVABLE IPSILATERAL LEVEL 1 OR 2 AXILLARY LYMPH NODES (N1) (C50.911)   You have a large palpable lymph node in your left axilla, or underarm This has been biopsied and shows metastatic breast cancer Your left breast exam is normal Exam of the right mastectomy wound is normal. I don't feel any cancer on the right side  you will be scheduled for excision of deep left axillary lymph node The preop MRI suggests skin thickening. If there is anything wrong with the skin I will do a small punch biopsy. Your cardiologist will need to give [place] a risk assessment and clearance to proceed with the surgery Hopefully the surgery can be done in the next 2 weeks or so  We have discussed the indications, techniques, and risks of the surgery in detail.  AXILLARY LUMP, LEFT (R22.32) Impression: Palpable mass left axilla. Biopsy shows invasive cancer consistent with invasive ductal carcinoma of the breast. Breast diagnostic profile shows this to be a Triple negative breast cancer also Breast exam normal but MRI pending MEMORY DISORDER (R41.3) TYPE 2 DIABETES MELLITUS TREATED WITHOUT INSULIN (E11.9) ASTHMA, MODERATE (J45.909) CORONARY ARTERY DISEASE, OCCLUSIVE (I25.10) HISTORY OF CEREBROVASCULAR ACCIDENT (Z86.73) HYPERTENSION, ESSENTIAL (I10) HYPERLIPIDEMIA, ACQUIRED (E78.5)   Emogene Muratalla M. Dalbert Batman, M.D., [facility] General and Minimally invasive Surgery Breast and Colorectal Surgery Office:   [phone] Pager:   [phone]

## 2017-04-04 ENCOUNTER — Encounter (HOSPITAL_BASED_OUTPATIENT_CLINIC_OR_DEPARTMENT_OTHER): Payer: Self-pay | Admitting: Anesthesiology

## 2017-04-04 ENCOUNTER — Ambulatory Visit (HOSPITAL_BASED_OUTPATIENT_CLINIC_OR_DEPARTMENT_OTHER): Payer: Medicare Other | Admitting: Anesthesiology

## 2017-04-04 ENCOUNTER — Ambulatory Visit
Admission: RE | Admit: 2017-04-04 | Discharge: 2017-04-04 | Disposition: A | Discharge status: Home / Self Care | Payer: Medicare Other | Source: Ambulatory Visit | Attending: General Surgery | Admitting: General Surgery

## 2017-04-04 ENCOUNTER — Encounter (HOSPITAL_BASED_OUTPATIENT_CLINIC_OR_DEPARTMENT_OTHER)
Admission: RE | Disposition: A | Discharge status: Home / Self Care | Payer: Self-pay | Source: Ambulatory Visit | Attending: General Surgery

## 2017-04-04 ENCOUNTER — Ambulatory Visit (HOSPITAL_BASED_OUTPATIENT_CLINIC_OR_DEPARTMENT_OTHER)
Admission: RE | Admit: 2017-04-04 | Discharge: 2017-04-04 | Disposition: A | Discharge status: Home / Self Care | Payer: Medicare Other | Source: Ambulatory Visit | Attending: General Surgery | Admitting: General Surgery

## 2017-04-04 DIAGNOSIS — E119 Type 2 diabetes mellitus without complications: Secondary | ICD-10-CM | POA: Diagnosis not present

## 2017-04-04 DIAGNOSIS — C7989 Secondary malignant neoplasm of other specified sites: Secondary | ICD-10-CM | POA: Diagnosis not present

## 2017-04-04 DIAGNOSIS — I10 Essential (primary) hypertension: Secondary | ICD-10-CM | POA: Insufficient documentation

## 2017-04-04 DIAGNOSIS — Z955 Presence of coronary angioplasty implant and graft: Secondary | ICD-10-CM | POA: Diagnosis not present

## 2017-04-04 DIAGNOSIS — C50911 Malignant neoplasm of unspecified site of right female breast: Secondary | ICD-10-CM | POA: Insufficient documentation

## 2017-04-04 DIAGNOSIS — E785 Hyperlipidemia, unspecified: Secondary | ICD-10-CM | POA: Diagnosis not present

## 2017-04-04 DIAGNOSIS — C773 Secondary and unspecified malignant neoplasm of axilla and upper limb lymph nodes: Principal | ICD-10-CM

## 2017-04-04 DIAGNOSIS — I2510 Atherosclerotic heart disease of native coronary artery without angina pectoris: Secondary | ICD-10-CM | POA: Insufficient documentation

## 2017-04-04 DIAGNOSIS — Z8673 Personal history of transient ischemic attack (TIA), and cerebral infarction without residual deficits: Secondary | ICD-10-CM | POA: Diagnosis not present

## 2017-04-04 DIAGNOSIS — M1990 Unspecified osteoarthritis, unspecified site: Secondary | ICD-10-CM | POA: Diagnosis not present

## 2017-04-04 DIAGNOSIS — C50912 Malignant neoplasm of unspecified site of left female breast: Secondary | ICD-10-CM

## 2017-04-04 DIAGNOSIS — Z171 Estrogen receptor negative status [ER-]: Secondary | ICD-10-CM

## 2017-04-04 DIAGNOSIS — C50311 Malignant neoplasm of lower-inner quadrant of right female breast: Secondary | ICD-10-CM

## 2017-04-04 HISTORY — PX: PUNCH BIOPSY OF SKIN: SHX6390

## 2017-04-04 HISTORY — PX: RADIOACTIVE SEED GUIDED AXILLARY SENTINEL LYMPH NODE: SHX6735

## 2017-04-04 LAB — GLUCOSE, CAPILLARY
GLUCOSE-CAPILLARY: 190 mg/dL — AB (ref 65–99)
Glucose-Capillary: 143 mg/dL — ABNORMAL HIGH (ref 65–99)

## 2017-04-04 SURGERY — RADIOACTIVE SEED GUIDED AXILLARY SENTINEL LYMPH NODE BIOPSY
Anesthesia: General | Site: Breast | Laterality: Left

## 2017-04-04 MED ORDER — BUPIVACAINE-EPINEPHRINE 0.5% -1:200000 IJ SOLN
INTRAMUSCULAR | Status: DC | PRN
  Administered 2017-04-04: 10 mL

## 2017-04-04 MED ORDER — EPHEDRINE SULFATE 50 MG/ML IJ SOLN
INTRAMUSCULAR | Status: DC | PRN
  Administered 2017-04-04 (×2): 10 mg via INTRAVENOUS

## 2017-04-04 MED ORDER — SODIUM CHLORIDE 0.9% FLUSH: 3.0000 mL | INTRAVENOUS | Status: DC | PRN

## 2017-04-04 MED ORDER — FENTANYL CITRATE (PF) 100 MCG/2ML IJ SOLN: 25.0000 ug | INTRAMUSCULAR | Status: DC | PRN

## 2017-04-04 MED ORDER — CEFAZOLIN SODIUM-DEXTROSE 2-4 GM/100ML-% IV SOLN
2.0000 g | INTRAVENOUS | Status: AC
  Administered 2017-04-04: 2 g via INTRAVENOUS

## 2017-04-04 MED ORDER — CHLORHEXIDINE GLUCONATE CLOTH 2 % EX PADS: 6.0000 | MEDICATED_PAD | Freq: Once | CUTANEOUS | Status: DC

## 2017-04-04 MED ORDER — PROPOFOL 10 MG/ML IV BOLUS
INTRAVENOUS | Status: AC
  Filled 2017-04-04: qty 40

## 2017-04-04 MED ORDER — FENTANYL CITRATE (PF) 100 MCG/2ML IJ SOLN
50.0000 ug | INTRAMUSCULAR | Status: DC | PRN
  Administered 2017-04-04 (×2): 50 ug via INTRAVENOUS

## 2017-04-04 MED ORDER — PROMETHAZINE HCL 25 MG/ML IJ SOLN: 6.2500 mg | INTRAMUSCULAR | Status: DC | PRN

## 2017-04-04 MED ORDER — SODIUM CHLORIDE 0.9 % IV SOLN: 250.0000 mL | INTRAVENOUS | Status: DC | PRN

## 2017-04-04 MED ORDER — GABAPENTIN 300 MG PO CAPS
ORAL_CAPSULE | ORAL | Status: AC
  Filled 2017-04-04: qty 1

## 2017-04-04 MED ORDER — SODIUM CHLORIDE 0.9 % IJ SOLN
INTRAMUSCULAR | Status: AC
  Filled 2017-04-04: qty 10

## 2017-04-04 MED ORDER — CELECOXIB 100 MG PO CAPS
ORAL_CAPSULE | ORAL | Status: AC
  Filled 2017-04-04: qty 1

## 2017-04-04 MED ORDER — HEPARIN SOD (PORK) LOCK FLUSH 100 UNIT/ML IV SOLN
INTRAVENOUS | Status: AC
  Filled 2017-04-04: qty 5

## 2017-04-04 MED ORDER — ACETAMINOPHEN 500 MG PO TABS
1000.0000 mg | ORAL_TABLET | ORAL | Status: AC
  Administered 2017-04-04: 1000 mg via ORAL

## 2017-04-04 MED ORDER — ONDANSETRON HCL 4 MG/2ML IJ SOLN
INTRAMUSCULAR | Status: DC | PRN
  Administered 2017-04-04: 4 mg via INTRAVENOUS

## 2017-04-04 MED ORDER — SCOPOLAMINE 1 MG/3DAYS TD PT72: 1.0000 | MEDICATED_PATCH | Freq: Once | TRANSDERMAL | Status: DC | PRN

## 2017-04-04 MED ORDER — DEXAMETHASONE SODIUM PHOSPHATE 4 MG/ML IJ SOLN
INTRAMUSCULAR | Status: DC | PRN
  Administered 2017-04-04: 5 mg via INTRAVENOUS

## 2017-04-04 MED ORDER — MIDAZOLAM HCL 2 MG/2ML IJ SOLN
INTRAMUSCULAR | Status: AC
  Filled 2017-04-04: qty 2

## 2017-04-04 MED ORDER — CELECOXIB 100 MG PO CAPS
100.0000 mg | ORAL_CAPSULE | ORAL | Status: AC
  Administered 2017-04-04: 100 mg via ORAL

## 2017-04-04 MED ORDER — LACTATED RINGERS IV SOLN
INTRAVENOUS | Status: DC
  Administered 2017-04-04 (×2): via INTRAVENOUS

## 2017-04-04 MED ORDER — CEFAZOLIN SODIUM-DEXTROSE 2-4 GM/100ML-% IV SOLN
INTRAVENOUS | Status: AC
  Filled 2017-04-04: qty 100

## 2017-04-04 MED ORDER — HEPARIN (PORCINE) IN NACL 2-0.9 UNIT/ML-% IJ SOLN
INTRAMUSCULAR | Status: AC
  Filled 2017-04-04: qty 500

## 2017-04-04 MED ORDER — ACETAMINOPHEN 325 MG PO TABS: 650.0000 mg | ORAL_TABLET | ORAL | Status: DC | PRN

## 2017-04-04 MED ORDER — SCOPOLAMINE 1 MG/3DAYS TD PT72: 1.0000 | MEDICATED_PATCH | TRANSDERMAL | Status: DC

## 2017-04-04 MED ORDER — ACETAMINOPHEN 650 MG RE SUPP: 650.0000 mg | RECTAL | Status: DC | PRN

## 2017-04-04 MED ORDER — HYDROMORPHONE HCL 1 MG/ML IJ SOLN: 0.2500 mg | INTRAMUSCULAR | Status: DC | PRN

## 2017-04-04 MED ORDER — METHYLENE BLUE 0.5 % INJ SOLN
INTRAVENOUS | Status: AC
  Filled 2017-04-04: qty 10

## 2017-04-04 MED ORDER — OXYCODONE HCL 5 MG PO TABS: 5.0000 mg | ORAL_TABLET | ORAL | Status: DC | PRN

## 2017-04-04 MED ORDER — SODIUM CHLORIDE 0.9% FLUSH: 3.0000 mL | Freq: Two times a day (BID) | INTRAVENOUS | Status: DC

## 2017-04-04 MED ORDER — BUPIVACAINE-EPINEPHRINE (PF) 0.5% -1:200000 IJ SOLN
INTRAMUSCULAR | Status: AC
  Filled 2017-04-04: qty 60

## 2017-04-04 MED ORDER — LACTATED RINGERS IV SOLN: INTRAVENOUS | Status: DC

## 2017-04-04 MED ORDER — PROPOFOL 10 MG/ML IV BOLUS
INTRAVENOUS | Status: DC | PRN
  Administered 2017-04-04: 150 mg via INTRAVENOUS

## 2017-04-04 MED ORDER — LIDOCAINE HCL (CARDIAC) 20 MG/ML IV SOLN
INTRAVENOUS | Status: DC | PRN
  Administered 2017-04-04: 100 mg via INTRAVENOUS

## 2017-04-04 MED ORDER — CEFAZOLIN SODIUM-DEXTROSE 2-3 GM-% IV SOLR
INTRAVENOUS | Status: DC | PRN
  Administered 2017-04-04: 2 g via INTRAVENOUS

## 2017-04-04 MED ORDER — FENTANYL CITRATE (PF) 100 MCG/2ML IJ SOLN
INTRAMUSCULAR | Status: AC
  Filled 2017-04-04: qty 2

## 2017-04-04 MED ORDER — MIDAZOLAM HCL 2 MG/2ML IJ SOLN: 1.0000 mg | INTRAMUSCULAR | Status: DC | PRN

## 2017-04-04 MED ORDER — ACETAMINOPHEN 500 MG PO TABS
ORAL_TABLET | ORAL | Status: AC
  Filled 2017-04-04: qty 2

## 2017-04-04 MED ORDER — GABAPENTIN 300 MG PO CAPS
300.0000 mg | ORAL_CAPSULE | ORAL | Status: AC
  Administered 2017-04-04: 300 mg via ORAL

## 2017-04-04 SURGICAL SUPPLY — 69 items
ADH SKN CLS APL DERMABOND .7 (GAUZE/BANDAGES/DRESSINGS) ×2
APL SKNCLS STERI-STRIP NONHPOA (GAUZE/BANDAGES/DRESSINGS)
APPLIER CLIP 11 MED OPEN (CLIP) ×3
APR CLP MED 11 20 MLT OPN (CLIP) ×2
BANDAGE ACE 6X5 VEL STRL LF (GAUZE/BANDAGES/DRESSINGS) IMPLANT
BENZOIN TINCTURE PRP APPL 2/3 (GAUZE/BANDAGES/DRESSINGS) IMPLANT
BLADE HEX COATED 2.75 (ELECTRODE) ×3 IMPLANT
BLADE SURG 10 STRL SS (BLADE) IMPLANT
BLADE SURG 15 STRL LF DISP TIS (BLADE) ×4 IMPLANT
BLADE SURG 15 STRL SS (BLADE) ×3
CANISTER SUCT 1200ML W/VALVE (MISCELLANEOUS) ×3 IMPLANT
CHLORAPREP W/TINT 26ML (MISCELLANEOUS) ×3 IMPLANT
CLIP APPLIE 11 MED OPEN (CLIP) ×2 IMPLANT
COVER BACK TABLE 60X90IN (DRAPES) ×3 IMPLANT
COVER MAYO STAND STRL (DRAPES) ×3 IMPLANT
COVER PROBE W GEL 5X96 (DRAPES) ×2 IMPLANT
DECANTER SPIKE VIAL GLASS SM (MISCELLANEOUS) IMPLANT
DERMABOND ADVANCED (GAUZE/BANDAGES/DRESSINGS) ×1
DERMABOND ADVANCED .7 DNX12 (GAUZE/BANDAGES/DRESSINGS) IMPLANT
DEVICE DUBIN W/COMP PLATE 8390 (MISCELLANEOUS) ×2 IMPLANT
DISPOSABLE BIOPSY PUNCH 6MM ×1 IMPLANT
DRAIN CHANNEL 19F RND (DRAIN) IMPLANT
DRAIN HEMOVAC 1/8 X 5 (WOUND CARE) IMPLANT
DRAPE LAPAROSCOPIC ABDOMINAL (DRAPES) ×3 IMPLANT
DRAPE UTILITY XL STRL (DRAPES) ×3 IMPLANT
DRSG PAD ABDOMINAL 8X10 ST (GAUZE/BANDAGES/DRESSINGS) ×1 IMPLANT
ELECT REM PT RETURN 9FT ADLT (ELECTROSURGICAL) ×3
ELECTRODE REM PT RTRN 9FT ADLT (ELECTROSURGICAL) ×2 IMPLANT
EVACUATOR SILICONE 100CC (DRAIN) IMPLANT
GAUZE SPONGE 4X4 12PLY STRL (GAUZE/BANDAGES/DRESSINGS) ×3 IMPLANT
GAUZE SPONGE 4X4 12PLY STRL LF (GAUZE/BANDAGES/DRESSINGS) IMPLANT
GAUZE SPONGE 4X4 16PLY XRAY LF (GAUZE/BANDAGES/DRESSINGS) IMPLANT
GLOVE BIO SURGEON STRL SZ 6.5 (GLOVE) ×2 IMPLANT
GLOVE EUDERMIC 7 POWDERFREE (GLOVE) ×3 IMPLANT
GOWN STRL REUS W/ TWL LRG LVL3 (GOWN DISPOSABLE) ×2 IMPLANT
GOWN STRL REUS W/ TWL XL LVL3 (GOWN DISPOSABLE) ×2 IMPLANT
GOWN STRL REUS W/TWL LRG LVL3 (GOWN DISPOSABLE) ×3
GOWN STRL REUS W/TWL XL LVL3 (GOWN DISPOSABLE) ×3
ILLUMINATOR WAVEGUIDE N/F (MISCELLANEOUS) IMPLANT
KIT MARKER MARGIN INK (KITS) ×2 IMPLANT
LIGHT WAVEGUIDE WIDE FLAT (MISCELLANEOUS) IMPLANT
NDL HYPO 25X1 1.5 SAFETY (NEEDLE) ×4 IMPLANT
NDL SAFETY ECLIPSE 18X1.5 (NEEDLE) ×2 IMPLANT
NEEDLE HYPO 18GX1.5 SHARP (NEEDLE)
NEEDLE HYPO 25X1 1.5 SAFETY (NEEDLE) ×3 IMPLANT
NS IRRIG 1000ML POUR BTL (IV SOLUTION) ×3 IMPLANT
PACK BASIN DAY SURGERY FS (CUSTOM PROCEDURE TRAY) ×3 IMPLANT
PAD ALCOHOL SWAB (MISCELLANEOUS) ×2 IMPLANT
PENCIL BUTTON HOLSTER BLD 10FT (ELECTRODE) ×3 IMPLANT
PIN SAFETY STERILE (MISCELLANEOUS) IMPLANT
SHEET MEDIUM DRAPE 40X70 STRL (DRAPES) ×3 IMPLANT
SLEEVE SCD COMPRESS KNEE MED (MISCELLANEOUS) IMPLANT
SPONGE LAP 18X18 X RAY DECT (DISPOSABLE) IMPLANT
SPONGE LAP 4X18 X RAY DECT (DISPOSABLE) ×3 IMPLANT
STRIP CLOSURE SKIN 1/2X4 (GAUZE/BANDAGES/DRESSINGS) IMPLANT
SUT ETHILON 3 0 FSL (SUTURE) IMPLANT
SUT ETHILON 4 0 PS 2 18 (SUTURE) ×1 IMPLANT
SUT MNCRL AB 4-0 PS2 18 (SUTURE) ×5 IMPLANT
SUT SILK 2 0 SH (SUTURE) ×2 IMPLANT
SUT VIC AB 2-0 CT1 27 (SUTURE)
SUT VIC AB 2-0 CT1 TAPERPNT 27 (SUTURE) IMPLANT
SUT VIC AB 3-0 SH 27 (SUTURE)
SUT VIC AB 3-0 SH 27X BRD (SUTURE) IMPLANT
SUT VICRYL 3-0 CR8 SH (SUTURE) ×3 IMPLANT
SYR 10ML LL (SYRINGE) ×5 IMPLANT
TOWEL OR 17X24 6PK STRL BLUE (TOWEL DISPOSABLE) ×5 IMPLANT
TOWEL OR NON WOVEN STRL DISP B (DISPOSABLE) ×2 IMPLANT
TUBE CONNECTING 20X1/4 (TUBING) ×3 IMPLANT
YANKAUER SUCT BULB TIP NO VENT (SUCTIONS) ×3 IMPLANT

## 2017-04-04 NOTE — Anesthesia Postprocedure Evaluation (Signed)
Anesthesia Post Note  Patient: Jennifer Garrett  Procedure(s) Performed: Procedure(s) (LRB): RADIOACTIVE SEED TARGETED LEFT AXILLARY LYMPH NODE EXCISION (Left) PUNCH BIOPSY OF LEFT BREAST SKIN (Left)     Patient location during evaluation: PACU Anesthesia Type: General Level of consciousness: sedated Pain management: pain level controlled Vital Signs Assessment: post-procedure vital signs reviewed and stable Respiratory status: spontaneous breathing and respiratory function stable Cardiovascular status: stable Postop Assessment: no apparent nausea or vomiting Anesthetic complications: no    Last Vitals:  Vitals:   04/04/17 0904 04/04/17 0928  BP:  (!) 151/68  Pulse: 60 (!) 57  Resp: 18 16  Temp:  36.7 C  SpO2: 96% 97%    Last Pain:  Vitals:   04/04/17 0928  TempSrc:   PainSc: 0-No pain                 Rhayne Chatwin DANIEL

## 2017-04-04 NOTE — Op Note (Signed)
Patient Name:           Jennifer Garrett   Date of Surgery:        04/04/2017  Pre op Diagnosis:      Metastatic breast cancer, left axilla                                      Left breast skin thickening, rule out skin involvement                                      History right breast cancer  Post op Diagnosis:    Same  Procedure:                 Punch biopsy skin, left breast                                      Excision deep left axillary lymph node with radioactive seed localization                                      Excision of additional left axillary tissue  Surgeon:                     Edsel Petrin. Dalbert Batman, M.D., FACS  Assistant:                      OR staff   Indication for Assistant: N/A  Operative Indications:    This is a 83 year old female, referred back to me by Dr. Jana Hakim because of metastatic breast cancer in the left axilla is triple negative. Consideration is to be given to excision of her left axillary mass for local control. Dr. [name] is her PCP. Dr. Percival Spanish is listed as her cardiologist.      She presented with locally advanced cancer of her right breast back in July 2017. MRI showed locally advanced disease on the right but there was no disease of the left breast or the left axilla. Image guided biopsy on the right side showed invasive ductal carcinoma, triple negative breast cancer. She did not respond to chemotherapy. On May 25, 2016 she underwent extended right modified radical mastectomy with complex primary wound closure, and recovered uneventfully.       Final pathology on the right showed 7.7 cm invasive duct carcinoma. Resection margins were negative at 6 out of 21 lymph nodes were positive with extracapsular extension. She has small pulmonary nodules but they have not progressed.       PET scan earlier this year showed increased uptake in the left axilla. Other staging workup shows no disease in the brain or bones. Small pulmonary nodules  are stable on his chest CT.  On February 22, 2017 she underwent image guided biopsy of left axillary lymph node which was obvious and palpable and this shows metastatic carcinoma consistent with invasive ductal carcinoma, also triple negative breast cancer. There is no known disease within the left breast but MRI shows some nonenhancing skin thickening, concerning for skin involvement.  There is no mass in the left breast. Dr. Jana Hakim has referred her for excision of the left  axillary mass and left breast skin biopsy for local control.  This is the consensus plan following presentation in breast conference. .    I discussed the indications, details, techniques, and risk of surgery with the patient and her daughter. . They agree with this plan, both the skin biopsy and the axillary mass excision  Operative Findings:       The scan of the left breast is a little bit thickened suggesting mild lymphatic obstruction but it is not inflamed and does not look like inflammatory breast cancer.  Skin punch biopsy was performed at the 12:00 position just outside the areolar margin.  The left axillary mass containing radioactive seed was removed and the seed was confirmed.  Additional tissue was taken because it felt slightly abnormal by palpation.  There was no residual mass in the left axilla at the completion of the case.  The specimen mammogram showed the biopsy clip and the radioactive seed within the excised mass.  The Faxitron failed to transmit the image to radiology.  I discussed the findings with Dr. Lovey Newcomer and he was comfortable from a radiology standpoint so long as the image was transmitted later.  The seed was confirmed by the pathologist.  Procedure in Detail:          Following the induction of general LMA anesthesia the patient's left breast and axilla were prepped and draped in a sterile fashion.  Surgical timeout was performed.  Intravenous antibodies were given.  0.5% Marcaine with epinephrine  was used as local infiltration anesthetic.    A 6 mm punch biopsy of the left breast skin at the 12:00 position just outside the areolar margin was performed and the specimen was sent to pathology.  The wound was closed with a single suture of 4-0 nylon and hemostasis was excellent.     A transverse incision was made in the left axilla at the hairline.  Dissection was carried down through the subcutaneous tissue.  The clavipectoral fascia was incised.  I dissected out a large 3-4 cm mass in the left axilla and confirmed that it contained the radioactive seed.  Specimen mammogram confirmed the seed and the original biopsy clip with discussion as above.  This was sent as a separate specimen to pathology.  There was one other lymph node that felt slightly enlarged and so I excised that and sent that to the lab able to additional axillary contents.  There is no more palpable abnormality.  Wound was irrigated with saline.  Hemostasis was excellent and achieved electrocautery and a metal clip.  The clavipectoral fascia was closed with interrupted sutures of 3-0 Vicryl and skin closed with a running subcutaneous taken of 4-0 Monocryl and Dermabond.  Clean bandages were placed and the patient taken to PACU in stable condition.  EBL 15 mL.  Counts correct.  Complications none.     Edsel Petrin. Dalbert Batman, M.D., FACS General and Minimally Invasive Surgery Breast and Colorectal Surgery   Addendum: I logged onto the [facility] website and reviewed her prescription medication history  04/04/2017 8:26 AM

## 2017-04-04 NOTE — Transfer of Care (Signed)
Immediate Anesthesia Transfer of Care Note  Patient: Jennifer Garrett  Procedure(s) Performed: Procedure(s): RADIOACTIVE SEED TARGETED LEFT AXILLARY LYMPH NODE EXCISION (Left) PUNCH BIOPSY OF LEFT BREAST SKIN (Left)  Patient Location: PACU  Anesthesia Type:General  Level of Consciousness: sedated  Airway & Oxygen Therapy: Patient Spontanous Breathing and Patient connected to face mask oxygen  Post-op Assessment: Report given to RN and Post -op Vital signs reviewed and stable  Post vital signs: Reviewed and stable  Last Vitals:  Vitals:   04/04/17 0708 04/04/17 0820  BP: (!) 162/53   Pulse: (!) 55   Resp: 18   Temp: 36.9 C (!) (P) 36.2 C  SpO2: 99%     Last Pain:  Vitals:   04/04/17 0708  TempSrc: Oral      Patients Stated Pain Goal: 0 (50/35/46 [id number])  Complications: No apparent anesthesia complications

## 2017-04-04 NOTE — Addendum Note (Signed)
Addendum  created 04/04/17 1117 by Duane Boston, MD   Sign clinical note

## 2017-04-04 NOTE — Discharge Instructions (Signed)
[place] Office Phone Number [phone]  BREAST BIOPSY/ PARTIAL MASTECTOMY: POST OP INSTRUCTIONS  Always review your discharge instruction sheet given to you by the facility where your surgery was performed.  IF YOU HAVE DISABILITY OR FAMILY LEAVE FORMS, YOU MUST BRING THEM TO THE OFFICE FOR PROCESSING.  DO NOT GIVE THEM TO YOUR DOCTOR.  1. A prescription for pain medication may be given to you upon discharge.  Take your pain medication as prescribed, if needed.  If narcotic pain medicine is not needed, then you may take acetaminophen (Tylenol) or ibuprofen (Advil) as needed. 2. Take your usually prescribed medications unless otherwise directed 3. If you need a refill on your pain medication, please contact your pharmacy.  They will contact our office to request authorization.  Prescriptions will not be filled after 5pm or on week-ends. 4. You should eat very light the first 24 hours after surgery, such as soup, crackers, pudding, etc.  Resume your normal diet the day after surgery. 5. Most patients will experience some swelling and bruising in the axilla, or under arm  Ice packs and a good support bra will help.  Swelling and bruising can take several days to resolve.  6. It is common to experience some constipation if taking pain medication after surgery.  Increasing fluid intake and taking a stool softener will usually help or prevent this problem from occurring.  A mild laxative (Milk of Magnesia or Miralax) should be taken according to package directions if there are no bowel movements after 48 hours. 7. Unless discharge instructions indicate otherwise, you may remove your bandages 24-48 hours after surgery, and you may shower at that time.  You may have steri-strips (small skin tapes) in place directly over the incision.  These strips should be left on the skin for 7-10 days.  If your surgeon used skin glue on the incision, you may shower in 24 hours.  The glue will  flake off over the next 2-3 weeks.  Any sutures or staples will be removed at the office during your follow-up visit. 8. ACTIVITIES:  You may resume regular daily activities (gradually increasing) beginning the next day.  Wearing a good support bra or sports bra minimizes pain and swelling.  You may have sexual intercourse when it is comfortable. a. You may drive when you no longer are taking prescription pain medication, you can comfortably wear a seatbelt, and you can safely maneuver your car and apply brakes. b. RETURN TO WORK:  __N/A____________________________________________________________________________________ 9. You should see your doctor in the office for a follow-up appointment approximately two weeks after your surgery.  Your doctors nurse will typically make your follow-up appointment when she calls you with your pathology report.  Expect your pathology report 2-3 business days after your surgery.  You may call to check if you do not hear from [place] after three days. 10. OTHER INSTRUCTIONS: ______ Dr. Dalbert Batman does not advise the use of narcotics for pain control in your case.  The use of the ice pack, Tylenol, and ibuprofen should be more than adequate _________________________________________________________________________________________ _____________________________________________________________________________________________________________________________________ _____________________________________________________________________________________________________________________________________ _____________________________________________________________________________________________________________________________________  WHEN TO CALL YOUR DOCTOR: 1. Fever over 101.0 2. Nausea and/or vomiting. 3. Extreme swelling or bruising. 4. Continued bleeding from incision. 5. Increased pain, redness, or drainage from the incision.  The clinic staff is available to answer your questions  during regular business hours.  Please dont hesitate to call and ask to speak to one of the nurses for clinical concerns.  If you have a medical emergency, go to the nearest emergency room or call 911.  A surgeon from [facility] is always on call at the hospital.  For further questions, please visit [email or web address]             Post Anesthesia Home Care Instructions  Activity: Get plenty of rest for the remainder of the day. A responsible individual must stay with you for 24 hours following the procedure.  For the next 24 hours, DO NOT: -Drive a car -Paediatric nurse -Drink alcoholic beverages -Take any medication unless instructed by your physician -Make any legal decisions or sign important papers.  Meals: Start with liquid foods such as gelatin or soup. Progress to regular foods as tolerated. Avoid greasy, spicy, heavy foods. If nausea and/or vomiting occur, drink only clear liquids until the nausea and/or vomiting subsides. Call your physician if vomiting continues.  Special Instructions/Symptoms: Your throat may feel dry or sore from the anesthesia or the breathing tube placed in your throat during surgery. If this causes discomfort, gargle with warm salt water. The discomfort should disappear within 24 hours.  If you had a scopolamine patch placed behind your ear for the management of post- operative nausea and/or vomiting:  1. The medication in the patch is effective for 72 hours, after which it should be removed.  Wrap patch in a tissue and discard in the trash. Wash hands thoroughly with soap and water. 2. You may remove the patch earlier than 72 hours if you experience unpleasant side effects which may include dry mouth, dizziness or visual disturbances. 3. Avoid touching the patch. Wash your hands with soap and water after contact with the patch.

## 2017-04-04 NOTE — Anesthesia Procedure Notes (Signed)
Procedure Name: LMA Insertion Date/Time: 04/04/2017 7:31 AM Performed by: Maryella Shivers Pre-anesthesia Checklist: Patient identified, Emergency Drugs available, Suction available and Patient being monitored Patient Re-evaluated:Patient Re-evaluated prior to induction Oxygen Delivery Method: Circle system utilized Preoxygenation: Pre-oxygenation with 100% oxygen Induction Type: IV induction Ventilation: Mask ventilation without difficulty LMA: LMA inserted LMA Size: 4.0 Number of attempts: 1 Airway Equipment and Method: Bite block Placement Confirmation: positive ETCO2 Tube secured with: Tape Dental Injury: Teeth and Oropharynx as per pre-operative assessment

## 2017-04-04 NOTE — Interval H&P Note (Signed)
History and Physical Interval Note:  04/04/2017 6:22 AM  Jennifer Garrett  has presented today for surgery, with the diagnosis of CANCER LEFT AXILLARY LYMPH NODES  The various methods of treatment have been discussed with the patient and family. After consideration of risks, benefits and other options for treatment, the patient has consented to  Procedure(s): RADIOACTIVE SEED TARGETED LEFT AXILLARY [place] (Left) as a surgical intervention .  The patient's history has been reviewed, patient examined, no change in status, stable for surgery.  I have reviewed the patient's chart and labs.  Questions were answered to the patient's satisfaction.     Adin Hector

## 2017-04-05 ENCOUNTER — Encounter (HOSPITAL_BASED_OUTPATIENT_CLINIC_OR_DEPARTMENT_OTHER): Payer: Self-pay | Admitting: General Surgery

## 2017-04-06 ENCOUNTER — Encounter (HOSPITAL_BASED_OUTPATIENT_CLINIC_OR_DEPARTMENT_OTHER): Payer: Self-pay | Admitting: General Surgery

## 2017-04-14 ENCOUNTER — Other Ambulatory Visit: Payer: Self-pay | Admitting: Cardiology

## 2017-05-01 NOTE — Progress Notes (Signed)
.  [place]  Telephone:([phone]) [phone] Fax:(336) [phone]     ID: Jennifer Garrett DOB: 1931-09-17  MR#: [id number]  RFF#:[id number]  Patient Care Team: Nolene Ebbs, MD as PCP - General (Internal Medicine) Fanny Skates, MD as Consulting Physician (General Surgery) Affan Callow, Virgie Dad, MD as Consulting Physician (Oncology) Minus Breeding, MD as Consulting Physician (Cardiology) Irene Limbo, MD as Consulting Physician (Plastic Surgery) PCP: Nolene Ebbs, MD  GYN: SU:  OTHER MD:  CHIEF COMPLAINT: Locally advanced triple negative breast cancer  CURRENT TREATMENT: Observation  INTERVAL HISTORY:  Jennifer Garrett returns today for follow-up of her triple negative breast cancer. She is accompanied by her daughter at this time. She notes that she is doing well overall and notes that she doesn't have any pain, discharge, or bleeding following her recent surgery.   Since her last visit to the office, she underwent diagnostic left breast mammography with tomography at [facility] on 03/15/2017. There was some left breast skin thickening of concern and the main issue was the left axillary mass which appeared increased. Subsequently, she had a MR bilateral breast completed on 03/16/2017. Left breast skin punch and left axilla biopsy ([id number]) on 04/04/2017 that showed: 1. Skin-Punch, Left breast. Benign breast terminal lobular glands. Squamous epithelial and subcutaneous soft tissue. Negative for carcinoma. 2. Lymph node, biopsy, Left axillary. Metastatic ductal carcinoma of the breast (4.8 cm, 1/1). 3. Lymph node, biopsy, Additional left axillary contents. Two benign lymph nodes (0/2).    REVIEW OF SYSTEMS: Jennifer Garrett's daughter reports that Jennifer Garrett's activity level is unchanged at this time. She continues with her daily home activities with no issues. Denies decreased appetite levels. She denies unusual headaches, visual changes, nausea, vomiting, or dizziness. There has been no unusual  cough, phlegm production, or pleurisy. This been no change in bowel or bladder habits. She denies unexplained fatigue or unexplained weight loss, bleeding, rash, or fever. A detailed review of systems was otherwise negative.     BREAST CANCER HISTORY: From the original intake note:  The patient presented to the emergency room 11/20/2015 with a complaint of a mass in the lower aspect of her right breast, which was painful and bleeding. The patient stated the mass had been present approximately a month. An ultrasound of the breast was obtained which found a 4.4 cm mass in the right breast at the 6:00 position. This was read as most consistent with an abscess. The patient was started on clindamycin and referred to the [facility], where on 01/05/2016 she underwent bilateral diagnostic mammography with tomography and right  ultrasonography.   Mammography showed far posteriorly in the 5:00 region of the right breast a 4.1 cm mass with enlarged axillary adenopathy. On exam there was a fungating ulcerated bleeding mass in the right breast at the 5:00 position 7 cm from the nipple. The right axilla was "full" on palpation. Ultrasound showed a 5.4 cm mass at the 5:00 position of the right breast 7 cm from the nipple, with at least 3 enlarged abnormal axillary lymph nodes, the largest measuring 3.7 cm.  Biopsy of the right breast mass and one axillary lymph node on 01/06/2016 showed ([id number]) both to be positive for an invasive ductal carcinoma, grade 3, triple negative, with the HER-2 signals ratio between 1.21-1.40 and the number per cell 2.85-3.00. The MIB-1-2 was 70%.  The patient's subsequent history is as detailed below    PAST MEDICAL HISTORY: Past Medical History:  Diagnosis Date  . Arthritis   .  Asthma    rarely uses neb  . Breast cancer ([place]) 05/25/2016   right breast  . Breast cancer of lower-inner quadrant of right female breast ([place]) 01/22/2016  . Carpal tunnel syndrome   .  Coronary artery disease    DES to OM3 90% stenosis 2003, 90% small RCA stenosis.    . Diabetes mellitus    not taken glipizide in over a year, checks surgars daily  . History of stomach ulcers   . Hyperlipemia   . Hypertension   . Irregular heart beat     PAST SURGICAL HISTORY: Past Surgical History:  Procedure Laterality Date  . ABDOMINAL HYSTERECTOMY    . CARDIAC CATHETERIZATION  2003   stent-  . CARPAL TUNNEL RELEASE  03/29/2012   Procedure: CARPAL TUNNEL RELEASE;  Surgeon: Wynonia Sours, MD;  Location: [facility];  Service: Orthopedics;  Laterality: Left;  . EYE SURGERY     catracts  . LESION EXCISION WITH COMPLEX REPAIR Right 05/25/2016   Procedure: complex repair of 25cm wound;  Surgeon: Irene Limbo, MD;  Location: [facility];  Service: Plastics;  Laterality: Right;  . MASTECTOMY     right  . MASTECTOMY MODIFIED RADICAL Right 05/25/2016   Procedure: RIGHT MASTECTOMY MODIFIED RADICAL;  Surgeon: Fanny Skates, MD;  Location: [facility];  Service: General;  Laterality: Right;  . PUNCH BIOPSY OF SKIN Left 04/04/2017   Procedure: PUNCH BIOPSY OF LEFT BREAST SKIN;  Surgeon: Fanny Skates, MD;  Location: [facility];  Service: General;  Laterality: Left;  . RADIOACTIVE SEED GUIDED AXILLARY SENTINEL LYMPH NODE Left 04/04/2017   Procedure: RADIOACTIVE SEED TARGETED LEFT AXILLARY LYMPH NODE EXCISION;  Surgeon: Fanny Skates, MD;  Location: [facility];  Service: General;  Laterality: Left;  . SHOULDER ARTHROSCOPY     right and left  . TRIGGER FINGER RELEASE  03/29/2012   Procedure: RELEASE TRIGGER FINGER/A-1 PULLEY;  Surgeon: Wynonia Sours, MD;  Location: [facility];  Service: Orthopedics;  Laterality: Left;    FAMILY HISTORY Family History  Problem Relation Age of Onset  . Heart disease Mother        No details   The patient's father died from "old age" at age 36. The patient's mother died at age 2 from complications of high  blood pressure and diabetes. The patient had one brother, 4 sisters. There is no cancer in the family to her knowledge   GYNECOLOGIC HISTORY:  No LMP recorded. Patient has had a hysterectomy.  menarche age 47, first live birth age 54. The patient is GX P2. She underwent abdominal hysterectomy with bilateral salpingo-oophorectomy in her 47s. She did not undergo hormone replacement  SOCIAL HISTORY:  Jennifer Garrett used to work for [employer]. She lives with her daughter Jennifer Garrett, who is Medical sales representative for [employer]. The patient's son Tulani Kidney lives in [place]. The patient has 1 grandchild, no great-grandchildren. She attends a local [place] DIRECTIVES:  not in place. At the 01/22/2016 visit the patient was given the appropriate documents to complete. She tells me she intends to name her daughter [name] as her healthcare part of attorney    HEALTH MAINTENANCE: Social History  Substance Use Topics  . Smoking status: Former Smoker    Quit date: 03/24/1978  . Smokeless tobacco: Never Used  . Alcohol use No     Colonoscopy: Never   PAP: status post hysterectomy   Bone density:  Never    Allergies  Allergen Reactions  . Lyrica [Pregabalin] Swelling    SWELLING REACTION UNSPECIFIED   . Feldene [Piroxicam] Rash  . Lisinopril Other (See Comments)    States that it makes her "sensitive to light"  . Orudis [Ketoprofen] Rash  . Vibramycin [Doxycycline Calcium] Rash and Hives    Current Outpatient Prescriptions  Medication Sig Dispense Refill  . albuterol (PROVENTIL HFA;VENTOLIN HFA) 108 (90 BASE) MCG/ACT inhaler Inhale 1 puff into the lungs every 6 (six) hours as needed for wheezing or shortness of breath. Reported on 02/04/2016    . aspirin EC 81 MG tablet Take 1 tablet by mouth every morning.    Marland Kitchen atorvastatin (LIPITOR) 40 MG tablet TAKE 1 TABLET (40 MG TOTAL) BY MOUTH DAILY. 90 tablet 3  . Blood Glucose Monitoring Suppl (ACCU-CHEK AVIVA PLUS)  w/Device KIT 1 Device by Does not apply route 2 (two) times daily. 1 kit 0  . calcium carbonate (OS-CAL) 600 MG TABS Take 600 mg by mouth 2 (two) times daily with a meal.    . diclofenac sodium (VOLTAREN) 1 % GEL Apply 1 application topically every other day.    . donepezil (ARICEPT) 5 MG tablet Take 5 mg by mouth at bedtime.    . ferrous sulfate 325 (65 FE) MG tablet Take 1 tablet (325 mg total) by mouth 2 (two) times daily with a meal. 60 tablet 3  . glipiZIDE (GLUCOTROL) 10 MG tablet Take 10 mg by mouth daily as needed (for high blood sugar (greater than 150)).     Marland Kitchen glucose blood (ACCU-CHEK AVIVA) test strip Use as instructed 100 each 12  . hydrochlorothiazide (MICROZIDE) 12.5 MG capsule Take 12.5 mg by mouth daily.    Marland Kitchen KLOR-CON M20 20 MEQ tablet Take 20 mEq by mouth daily.    . Lancets (ACCU-CHEK SOFT TOUCH) lancets Use as instructed 100 each 12  . naproxen sodium (ANAPROX) 220 MG tablet Take 220 mg by mouth 2 (two) times daily as needed (for pain.).    Marland Kitchen olmesartan (BENICAR) 40 MG tablet Take 40 mg by mouth daily.     No current facility-administered medications for this visit.     OBJECTIVE: Older African-American womanWho appears stated age   83:   05/04/17 1444  BP: (!) 176/64  Pulse: 60  Resp: 18  Temp: 97.6 F (36.4 C)  SpO2: 97%     Body mass index is 30.68 kg/m.      ECOG FS:1 - Symptomatic but completely ambulatory  Sclerae unicteric, EOMs intact Oropharynx clear and moist No cervical or supraclavicular adenopathy Lungs no rales or rhonchi Heart regular rate and rhythm Abd soft, nontender, positive bowel sounds MSK no focal spinal tenderness, no upper extremity lymphedema Neuro: nonfocal, well oriented, appropriate affect Breasts: The right side has undergone mastectomy and chest wall radiation. There is no evidence of local recurrence. The left breast is benign. The left axilla is status post recent surgery. The incision is healing very nicely. There is no  erythema, swelling, or tenderness.  LAB RESULTS:  CMP     Component Value Date/Time   NA 140 03/30/2017 0919   NA 142 09/14/2016 0912   K 4.2 03/30/2017 0919   K 3.9 09/14/2016 0912   CL 109 03/30/2017 0919   CO2 25 03/30/2017 0919   CO2 28 09/14/2016 0912   GLUCOSE 97 03/30/2017 0919   GLUCOSE 152 (H) 09/14/2016 0912   BUN 12 03/30/2017 0919   BUN 17.9 09/14/2016 0912  CREATININE 0.99 03/30/2017 0919   CREATININE 1.1 09/14/2016 0912   CALCIUM 9.0 03/30/2017 0919   CALCIUM 9.9 09/14/2016 0912   PROT 7.7 09/14/2016 0912   ALBUMIN 3.7 09/14/2016 0912   AST 18 09/14/2016 0912   ALT 12 09/14/2016 0912   ALKPHOS 74 09/14/2016 0912   BILITOT 0.42 09/14/2016 0912   GFRNONAA 52 (L) 03/30/2017 0919   GFRAA 60 (L) 03/30/2017 0919    INo results found for: SPEP, UPEP  Lab Results  Component Value Date   WBC 5.4 05/04/2017   NEUTROABS 2.7 05/04/2017   HGB 10.2 (L) 05/04/2017   HCT 31.9 (L) 05/04/2017   MCV 87.6 05/04/2017   PLT 212 05/04/2017      Chemistry      Component Value Date/Time   NA 140 03/30/2017 0919   NA 142 09/14/2016 0912   K 4.2 03/30/2017 0919   K 3.9 09/14/2016 0912   CL 109 03/30/2017 0919   CO2 25 03/30/2017 0919   CO2 28 09/14/2016 0912   BUN 12 03/30/2017 0919   BUN 17.9 09/14/2016 0912   CREATININE 0.99 03/30/2017 0919   CREATININE 1.1 09/14/2016 0912      Component Value Date/Time   CALCIUM 9.0 03/30/2017 0919   CALCIUM 9.9 09/14/2016 0912   ALKPHOS 74 09/14/2016 0912   AST 18 09/14/2016 0912   ALT 12 09/14/2016 0912   BILITOT 0.42 09/14/2016 0912       No results found for: LABCA2  No components found for: LABCA125  No results for input(s): INR in the last 168 hours.  Urinalysis    Component Value Date/Time   COLORURINE YELLOW 05/19/2016 1138   APPEARANCEUR CLEAR 05/19/2016 1138   LABSPEC 1.017 05/19/2016 1138   PHURINE 5.5 05/19/2016 1138   GLUCOSEU NEGATIVE 05/19/2016 1138   HGBUR NEGATIVE 05/19/2016 1138    BILIRUBINUR NEGATIVE 05/19/2016 1138   KETONESUR NEGATIVE 05/19/2016 1138   PROTEINUR NEGATIVE 05/19/2016 1138   UROBILINOGEN 1.0 11/28/2013 0143   NITRITE NEGATIVE 05/19/2016 1138   LEUKOCYTESUR NEGATIVE 05/19/2016 1138     STUDIES: No results found. Diagnostic mammography with tomography, Left on 03/15/2017 that showed: Breast density B. 1. Marked diffuse skin thickening in the left breast which could be related to edema versus tumor involvement (ie inflammatory breast cancer). No suspicious masses or distortions are identified within the left breast. 2. Non visualization of the biopsy proven metastatic left axillary lymph node. 3. The patient is status post right breast mastectomy.  MR Breast bilateral on 03/16/2017 that showed: 1. The known metastatic node in the left axilla is identified. No suspicious masses seen in the left breast. 2. Skin thickening in the left breast without obvious mass or enhancement. This could represent edema. However, inflammatory breast cancer is not excluded.  ASSESSMENT: 83 y.o. [place] woman status post right breast lower outer quadrant and right axillary lymph node biopsy 01/06/2016 both positive for a clinical T4 N1 M1, stage IV invasive ductal carcinoma, grade 3, triple negative, with an MIB-1 of 70%  (a) CT scan of the chest and bone scan 01/27/2016 showed scattered pulmonary nodules in addition to locally advanced disease  (b) repeat CT scan of the chest 04/12/2016 shows persistent pulmonary nodules, progression in local disease  (c) CA-27-29 is noninformative  (1) neoadjuvant chemotherapy consisting of capecitabine, 1500 mg twice daily, 7 days on 7 days off, starting 01/27/2016, discontinued 04/14/2016 with progression  (2) right modified radical mastectomy 05/25/2016 showed a pT3 pN2, stage IIIA invasive ductal  carcinoma, grade 3, with negative margins.  (3) adjuvant radiation 08/24/16- 10/07/16 Site/dose:   1) Right chest wall / 50.4 Gy in 28  fractions                         2) Right chest wall boost / 10 Gy in 5 fractions  (4) restaging studies and follow-up:  (a) PET scan 08/24/2016 was negative except for a 1.0 cm LEFT axillary lymph node  (b)  biopsy of the left axillary lymph node 02/22/2017 showed breast carcinoma, again triple negative  (5) on 04/04/2017 she underwent removal of the left axillary mass(measuring 4.8 cm,) together with 2 benign lymph nodes.  (a) skin punch biopsy on the same day was benign  (6) adjuvant radiation to the left axillary tumor bed pending  PLAN: Lillah is doing very well clinically and tolerated her recent surgery without difficulty.  We reviewed the results in detail. She and her daughter understand that from a systemic point of view the only treatment we have to offer [place] is chemotherapy and that we did chemotherapy before with very little response.  The question is whether we should proceed to adjuvant radiation to optimize local control. I think that would be prudent in this case, since if we have uncontrolled disease locally in this patient and her quality of life would be simply terrible and if we can prevent that with a brief course of radiation and that certainly would be worthwhile. They agree.  Accordingly I am placing a referral to Dr. Lisbeth Renshaw with that in mind. She will return to see me in January. At that time we will reassess and very likely was set her up for repeat PET scan sometime in March.  If and when she has distant disease recurrence we will have to decide whether to proceed to chemotherapy or 2 comfort care  They know to call for any other issues that may develop before the next visit here. Camela Wich, Virgie Dad, MD  05/04/17 3:06 PM Medical Oncology and Hematology [facility] [address] [place], [place] [address] Tel. [phone]    Fax. [phone]  This document serves as a record of services personally performed by Lurline Del, MD. It was  created on her behalf by Steva Colder, a trained medical scribe. The creation of this record is based on the scribe's personal observations and the provider's statements to them. This document has been checked and approved by the attending provider.

## 2017-05-04 ENCOUNTER — Ambulatory Visit (HOSPITAL_BASED_OUTPATIENT_CLINIC_OR_DEPARTMENT_OTHER): Payer: Medicare Other | Admitting: Oncology

## 2017-05-04 ENCOUNTER — Other Ambulatory Visit (HOSPITAL_BASED_OUTPATIENT_CLINIC_OR_DEPARTMENT_OTHER): Payer: Medicare Other

## 2017-05-04 VITALS — BP 176/64 | HR 60 | Temp 97.6°F | Resp 18 | Ht 60.0 in | Wt 157.1 lb

## 2017-05-04 DIAGNOSIS — C7800 Secondary malignant neoplasm of unspecified lung: Secondary | ICD-10-CM

## 2017-05-04 DIAGNOSIS — Z171 Estrogen receptor negative status [ER-]: Secondary | ICD-10-CM

## 2017-05-04 DIAGNOSIS — C50311 Malignant neoplasm of lower-inner quadrant of right female breast: Secondary | ICD-10-CM

## 2017-05-04 DIAGNOSIS — C773 Secondary and unspecified malignant neoplasm of axilla and upper limb lymph nodes: Secondary | ICD-10-CM

## 2017-05-04 LAB — COMPREHENSIVE METABOLIC PANEL
ALT: 11 U/L (ref 0–55)
ANION GAP: 5 meq/L (ref 3–11)
AST: 20 U/L (ref 5–34)
Albumin: 3.3 g/dL — ABNORMAL LOW (ref 3.5–5.0)
Alkaline Phosphatase: 67 U/L (ref 40–150)
BUN: 22.7 mg/dL (ref 7.0–26.0)
CO2: 28 meq/L (ref 22–29)
Calcium: 9.3 mg/dL (ref 8.4–10.4)
Chloride: 110 mEq/L — ABNORMAL HIGH (ref 98–109)
Creatinine: 1.1 mg/dL (ref 0.6–1.1)
EGFR: 54 mL/min/{1.73_m2} — AB (ref >60)
Glucose: 125 mg/dl (ref 70–140)
POTASSIUM: 4.2 meq/L (ref 3.5–5.1)
Sodium: 143 mEq/L (ref 136–145)
TOTAL PROTEIN: 7.2 g/dL (ref 6.4–8.3)
Total Bilirubin: 0.32 mg/dL (ref 0.20–1.20)

## 2017-05-04 LAB — CBC WITH DIFFERENTIAL/PLATELET
BASO%: 0.9 % (ref 0.0–2.0)
Basophils Absolute: 0.1 10*3/uL (ref 0.0–0.1)
EOS ABS: 0.2 10*3/uL (ref 0.0–0.5)
EOS%: 4.2 % (ref 0.0–7.0)
HEMATOCRIT: 31.9 % — AB (ref 34.8–46.6)
HGB: 10.2 g/dL — ABNORMAL LOW (ref 11.6–15.9)
LYMPH%: 33.1 % (ref 14.0–49.7)
MCH: 28 pg (ref 25.1–34.0)
MCHC: 32 g/dL (ref 31.5–36.0)
MCV: 87.6 fL (ref 79.5–101.0)
MONO#: 0.7 10*3/uL (ref 0.1–0.9)
MONO%: 12 % (ref 0.0–14.0)
NEUT%: 49.8 % (ref 38.4–76.8)
NEUTROS ABS: 2.7 10*3/uL (ref 1.5–6.5)
NRBC: 0 % (ref 0–0)
Platelets: 212 10*3/uL (ref 145–400)
RBC: 3.64 10*6/uL — AB (ref 3.70–5.45)
RDW: 13.9 % (ref 11.2–14.5)
WBC: 5.4 10*3/uL (ref 3.9–10.3)
lymph#: 1.8 10*3/uL (ref 0.9–3.3)

## 2017-05-04 LAB — TECHNOLOGIST REVIEW

## 2017-05-05 ENCOUNTER — Telehealth: Payer: Self-pay | Admitting: Oncology

## 2017-05-05 ENCOUNTER — Encounter: Payer: Self-pay | Admitting: Radiation Oncology

## 2017-05-05 NOTE — Telephone Encounter (Signed)
Scheduled appt per 10/17 los - left message with apt date and time .

## 2017-05-09 NOTE — Progress Notes (Signed)
Location of Breast Cancer:Left axillary. Metastatic ductal carcinoma of the breast   Histology per Pathology Report:  Diagnosis 04-04-17 1. Skin-Punch, Left breast BENIGN BREAST TERMINAL LOBULAR GLANDS SQUAMOUS EPITHELIAL AND SUBCUTANOUS SOFT TISSUE NEGATIVE FOR CARCINOMA 2. Lymph node, biopsy, Left axillary METASTATIC DUCTAL CARCINOMA OF THE BREAST (4.8 CM, 1/1) 3. Lymph node, biopsy, Additional left axillary contents TWO BENIGN LYMPH NODES (0/2)  Receptor Status: ER(), PR (), Her2-neu (), Ki-()  Did patient present with symptoms (if so, please note symptoms) or was this found on screening mammography?: diagnostic left breast mammography with tomography at [facility] on 03/15/2017. There was some left breast skin thickening of concern and the main issue was the left axillary mass which appeared increased. Subsequently, she had a MR bilateral breast completed on 03/16/2017.   Past/Anticipated interventions by surgeon, if any:    Dr. Fanny Skates 04-04-17  Radioactive seed targeted left axillary lymph node excision ,Skin-Punch, Left breast  Past/Anticipated interventions by medical oncology, if any: Chemotherapy  No, Referral for adjuvant radiation to the left axillary, PET scan 08/24/2016 was negative except for a 1.0 cm LEFT axillary lymph node             (b)  biopsy of the left axillary lymph node 02/22/2017 showed breast carcinoma, again triple negative  Lymphedema issues, if any:   No  Pain issues, if any: No  SAFETY ISSUES:No Prior radiation? adjuvant radiation 08/24/16- 10/07/16                        Right chest wall / 50.4 Gy in 28 fractions   Right chest wall boost / 10 Gy in 5 fractions     Pacemaker/ICD? : No  Possible current pregnancy?: No  Is the patient on methotrexate? : No  Menarche 12, G2 P2, BC No , Menopause 52,HRT No   Current Complaints / other details:[name] woman status post right breast lower outer quadrant  and right axillary lymph node biopsy 01/06/2016 both positive for a clinical T4 N1 M1, stage IV invasive ductal carcinoma, grade 3, triple negative, with an MIB-1 of 70%             (a) CT scan of the chest and bone scan 01/27/2016 showed scattered pulmonary nodules in addition to locally advanced disease             (b) repeat CT scan of the chest 04/12/2016 shows persistent pulmonary nodules, progression in local disease             (c) CA-27-29 is noninformative  (1) neoadjuvant chemotherapy consisting of capecitabine, 1500 mg twice daily, 7 days on 7 days off, starting 01/27/2016, discontinued 04/14/2016 with progression  (2) right modified radical mastectomy 05/25/2016 showed a pT3 pN2, stage IIIA invasive ductal carcinoma, grade 3, with negative margins.  (3) adjuvant radiation 08/24/16- 10/07/16 Site/dose:1) Right chest wall / 50.4 Gy in 28 fractions 2) Right chest wall boost / 10 Gy in 5 fractions  Site/dose:1) Right chest wall / 50.4 Gy in 28 fractions 2) Right chest wall boost / 10 Gy in 5 fractions  (2) right modified radical mastectomy 05/25/2016 showed a pT3 pN2, stage IIIA invasive ductal carcinoma, grade 3, with negative margins.  (3) adjuvant radiation 08/24/16- 10/07/16 Site/dose:1) Right chest wall / 50.4 Gy in 28 fractions 2) Right chest wall boost / 10 Gy in 5 fractions Wt Readings from Last 3 Encounters:  05/11/17 153 lb 12.8 oz (69.8 kg)  05/04/17  157 lb 1.6 oz (71.3 kg)  04/04/17 152 lb (68.9 kg)  BP (!) 153/98   Pulse 60   Temp 97.7 F (36.5 C) (Oral)   Resp 18   Ht (!) 5" (0.127 m)   Wt 153 lb 12.8 oz (69.8 kg)   SpO2 100%   BMI 4325.33 kg/m   Jennifer Spurling, RN 05/09/2017,5:48 PM                      Jennifer Spurling, RN 05/09/2017,5:48 PM

## 2017-05-11 ENCOUNTER — Encounter: Payer: Self-pay | Admitting: Radiation Oncology

## 2017-05-11 ENCOUNTER — Ambulatory Visit
Admission: RE | Admit: 2017-05-11 | Discharge: 2017-05-11 | Disposition: A | Discharge status: Home / Self Care | Payer: Medicare Other | Source: Ambulatory Visit | Attending: Radiation Oncology | Admitting: Radiation Oncology

## 2017-05-11 DIAGNOSIS — Z510 Encounter for antineoplastic radiation therapy: Secondary | ICD-10-CM | POA: Insufficient documentation

## 2017-05-11 DIAGNOSIS — Z90710 Acquired absence of both cervix and uterus: Secondary | ICD-10-CM | POA: Insufficient documentation

## 2017-05-11 DIAGNOSIS — Z9011 Acquired absence of right breast and nipple: Secondary | ICD-10-CM | POA: Diagnosis not present

## 2017-05-11 DIAGNOSIS — C773 Secondary and unspecified malignant neoplasm of axilla and upper limb lymph nodes: Secondary | ICD-10-CM | POA: Diagnosis not present

## 2017-05-11 DIAGNOSIS — Z79899 Other long term (current) drug therapy: Secondary | ICD-10-CM | POA: Insufficient documentation

## 2017-05-11 DIAGNOSIS — I10 Essential (primary) hypertension: Secondary | ICD-10-CM | POA: Insufficient documentation

## 2017-05-11 DIAGNOSIS — E119 Type 2 diabetes mellitus without complications: Secondary | ICD-10-CM | POA: Insufficient documentation

## 2017-05-11 DIAGNOSIS — Z853 Personal history of malignant neoplasm of breast: Secondary | ICD-10-CM | POA: Diagnosis not present

## 2017-05-11 DIAGNOSIS — Z9889 Other specified postprocedural states: Secondary | ICD-10-CM | POA: Insufficient documentation

## 2017-05-11 DIAGNOSIS — Z7982 Long term (current) use of aspirin: Secondary | ICD-10-CM | POA: Diagnosis not present

## 2017-05-11 DIAGNOSIS — G5600 Carpal tunnel syndrome, unspecified upper limb: Secondary | ICD-10-CM | POA: Insufficient documentation

## 2017-05-11 DIAGNOSIS — Z87891 Personal history of nicotine dependence: Secondary | ICD-10-CM | POA: Diagnosis not present

## 2017-05-11 NOTE — Progress Notes (Addendum)
Radiation Oncology         [phone]) [phone] ________________________________  Name: Jennifer Garrett MRN: [id number]  Date: 05/11/2017  DOB: 1932-02-06  Follow Up Note  CC: Jennifer Ebbs, MD  Magrinat, Virgie Dad, MD  Diagnosis:   Stage IV T4, N1, M1 grade 3 triple negative, invasive ductal carcinoma of the right breast with left axillary adenopathy consistent with triple negative invasive ductal carcinoma.  Interval Since Last Radiation: 7 months 08/24/16- 10/07/16: 1.  Right chest wall / 50.4 Gy in 28 fractions 2.   Right chest wall boost / 10 Gy in 5 fractions  Narrative:  [name] is a pleasant 83 y.o. female with a history of advanced right triple negative breast cancer. She was originally seen after a palpable mass was noted. Diagnostic imaging revealed an irregular hypoechoic mass in the right breast at 5 o'clock 7 cm from the nipple measuring 5.4 x 3.9 x 4.1 cm. Sonographic evaluation of the right axilla showed 3 nodals masses. The largest nodal mass measured 3.6 x 1.8 x 3.7 cm. Biopsy of the right breast lower outer quadrant on 01/06/2016 revealed invasive ductal carcinoma and DCIS. This was grade 3 breast carcinoma, ER 0% / PR 0% / HER-2 negative / Ki-67 70%. Biopsy of the right axilla lymph node was positive for ductal carcinoma. Her metastatic work up was negative but had locoregional lymph node involvement by imaging. She does have s a small nodule in the right upper lobe measuring 4 mm. She went on to receive neoadjuvant chemotherapy, though this was interrupted by surgery due to concerns for progressive disease, but stable lung nodues. She subsequently underwent right modified radical mastectomy and axillary dissection on 05/25/2016 with Dr. Dalbert Batman. Pathology showed invasive ductal carcinoma with calcifications, grade III/III, spanning 7.7 cm. Multiple intradermal nodules of similar appearing ductal carcinoma. The surgical resection margins were negative for carcinoma. Metastatic  carcinoma in 6 of 21 lymph nodes with extracapsular extension. ER 0% / PR 0% / HER-2 negative / Ki-67 90%. Following surgery she completed 6 1/2 weeks of radiotherapy to the right chest wall and regional nodes. She was found in February 2018 on PET scan to have mild axillary adenopathy on the left side. She subsequently was followed clinically, and the decision was made to proceed with biopsy of these nodes. Additional imaging revealed thickening of the skin of the left breast. Because of these findings she was taken to the OR for sentinel node and puch biopsy on  04/04/17. The skin was benign, and 1 out of three sampled nodes was involved with invasive ducal carcinoma, triple negative and was felt to be the same cancer as her right sided disease.  She comes to discuss options of radiotherapy to the left axilla.                   On review of systems, the patient reports that she is doing well overall. She is tired but denies any progressive changes with this. She denies upper extremity edema. She has good range of motion since surgery. She denies any chest pain, shortness of breath, cough, fevers, chills, night sweats, unintended weight changes. She denies any bowel or bladder disturbances, and denies abdominal pain, nausea or vomiting. She denies any new musculoskeletal or joint aches or pains, new skin lesions or concerns. A complete review of systems is obtained and is otherwise negative.  Past Medical History:  Past Medical History:  Diagnosis Date  . Arthritis   . Asthma  rarely uses neb  . Breast cancer ([place]) 05/25/2016   right breast  . Breast cancer of lower-inner quadrant of right female breast ([place]) 01/22/2016  . Carpal tunnel syndrome   . Coronary artery disease    DES to OM3 90% stenosis 2003, 90% small RCA stenosis.    . Diabetes mellitus    not taken glipizide in over a year, checks surgars daily  . History of stomach ulcers   . Hyperlipemia   . Hypertension   . Irregular heart  beat     Past Surgical History: Past Surgical History:  Procedure Laterality Date  . ABDOMINAL HYSTERECTOMY    . CARDIAC CATHETERIZATION  2003   stent-  . CARPAL TUNNEL RELEASE  03/29/2012   Procedure: CARPAL TUNNEL RELEASE;  Surgeon: Wynonia Sours, MD;  Location: [facility];  Service: Orthopedics;  Laterality: Left;  . EYE SURGERY     catracts  . LESION EXCISION WITH COMPLEX REPAIR Right 05/25/2016   Procedure: complex repair of 25cm wound;  Surgeon: Irene Limbo, MD;  Location: [facility];  Service: Plastics;  Laterality: Right;  . MASTECTOMY     right  . MASTECTOMY MODIFIED RADICAL Right 05/25/2016   Procedure: RIGHT MASTECTOMY MODIFIED RADICAL;  Surgeon: Fanny Skates, MD;  Location: [facility];  Service: General;  Laterality: Right;  . PUNCH BIOPSY OF SKIN Left 04/04/2017   Procedure: PUNCH BIOPSY OF LEFT BREAST SKIN;  Surgeon: Fanny Skates, MD;  Location: [facility];  Service: General;  Laterality: Left;  . RADIOACTIVE SEED GUIDED AXILLARY SENTINEL LYMPH NODE Left 04/04/2017   Procedure: RADIOACTIVE SEED TARGETED LEFT AXILLARY LYMPH NODE EXCISION;  Surgeon: Fanny Skates, MD;  Location: [facility];  Service: General;  Laterality: Left;  . SHOULDER ARTHROSCOPY     right and left  . TRIGGER FINGER RELEASE  03/29/2012   Procedure: RELEASE TRIGGER FINGER/A-1 PULLEY;  Surgeon: Wynonia Sours, MD;  Location: [facility];  Service: Orthopedics;  Laterality: Left;    Social History:  Social History   Social History  . Marital status: Single    Spouse name: N/A  . Number of children: 2  . Years of education: N/A   Occupational History  . Not on file.   Social History Main Topics  . Smoking status: Former Smoker    Quit date: 03/24/1978  . Smokeless tobacco: Never Used  . Alcohol use No  . Drug use: No  . Sexual activity: Not on file   Other Topics Concern  . Not on file   Social History Narrative   Lives with daughter.   One grandson.   She comes today accompanied by her daughter.  Family History: Family History  Problem Relation Age of Onset  . Heart disease Mother        No details      ALLERGIES:  is allergic to lyrica [pregabalin]; feldene [piroxicam]; lisinopril; orudis [ketoprofen]; and vibramycin [doxycycline calcium].  Meds: Current Outpatient Prescriptions  Medication Sig Dispense Refill  . albuterol (PROVENTIL HFA;VENTOLIN HFA) 108 (90 BASE) MCG/ACT inhaler Inhale 1 puff into the lungs every 6 (six) hours as needed for wheezing or shortness of breath. Reported on 02/04/2016    . aspirin EC 81 MG tablet Take 1 tablet by mouth every morning.    Marland Kitchen atorvastatin (LIPITOR) 40 MG tablet TAKE 1 TABLET (40 MG TOTAL) BY MOUTH DAILY. 90 tablet 3  . Blood Glucose Monitoring Suppl (ACCU-CHEK AVIVA PLUS) w/Device KIT 1 Device  by Does not apply route 2 (two) times daily. 1 kit 0  . calcium carbonate (OS-CAL) 600 MG TABS Take 600 mg by mouth 2 (two) times daily with a meal.    . diclofenac sodium (VOLTAREN) 1 % GEL Apply 1 application topically every other day.    . donepezil (ARICEPT) 5 MG tablet Take 5 mg by mouth at bedtime.    . ferrous sulfate 325 (65 FE) MG tablet Take 1 tablet (325 mg total) by mouth 2 (two) times daily with a meal. 60 tablet 3  . glipiZIDE (GLUCOTROL) 10 MG tablet Take 10 mg by mouth daily as needed (for high blood sugar (greater than 150)).     Marland Kitchen glucose blood (ACCU-CHEK AVIVA) test strip Use as instructed 100 each 12  . hydrochlorothiazide (MICROZIDE) 12.5 MG capsule Take 12.5 mg by mouth daily.    Marland Kitchen KLOR-CON M20 20 MEQ tablet Take 20 mEq by mouth daily.    . Lancets (ACCU-CHEK SOFT TOUCH) lancets Use as instructed 100 each 12  . naproxen sodium (ANAPROX) 220 MG tablet Take 220 mg by mouth 2 (two) times daily as needed (for pain.).    Marland Kitchen olmesartan (BENICAR) 40 MG tablet Take 40 mg by mouth daily.     No current facility-administered medications for this encounter.     Physical  Findings:  vitals were not taken for this visit.  /10 In general this is a well appearing African American female in no acute distress. She's alert and oriented x4 and appropriate throughout the examination. Cardiopulmonary assessment is negative for acute distress and she exhibits normal effort. The right chest wall incision site is intact with no evidence of desquamation. There is moderate hyperpigmentation noted of the chest wall.  Lab Findings: Lab Results  Component Value Date   WBC 5.4 05/04/2017   HGB 10.2 (L) 05/04/2017   HCT 31.9 (L) 05/04/2017   MCV 87.6 05/04/2017   PLT 212 05/04/2017     Radiographic Findings: No results found.  Impression/Plan: 1. Stage IV T4, N1, M1 grade 3 triple negative, invasive ductal carcinoma of the right breast with disease in the chest with concerns of locoregional disease in the left axilla. Dr. Lisbeth Renshaw discusses the findings from her recent sentinel node evaluation and discusses that there are several cases described in the literature to support findings of this spread pattern to be consistent with locoregional recurrence, and as such should be treated aggressively. We discussed the role of radiotherapy for curative intent and Dr. Lisbeth Renshaw would recommend a course of 5 weeks of treatment to the left axilla and supraclavicular nodes. She is in agreement.  We discussed the risks, benefits, short, and long term effects of radiotherapy, and logistics of radiotherapy. She will return tomorrow for simulation. Written consent is obtained and placed in the chart, a copy was provided to the patient.   In a visit lasting 30 minutes, greater than 50% of the time was spent face to face discussing the findings and role of radiotherapy, and coordinating the patient's care.    Carola Rhine, PAC

## 2017-05-12 ENCOUNTER — Ambulatory Visit
Admission: RE | Admit: 2017-05-12 | Discharge: 2017-05-12 | Disposition: A | Discharge status: Home / Self Care | Payer: Medicare Other | Source: Ambulatory Visit | Attending: Radiation Oncology | Admitting: Radiation Oncology

## 2017-05-12 DIAGNOSIS — C773 Secondary and unspecified malignant neoplasm of axilla and upper limb lymph nodes: Secondary | ICD-10-CM | POA: Insufficient documentation

## 2017-05-12 DIAGNOSIS — Z510 Encounter for antineoplastic radiation therapy: Secondary | ICD-10-CM | POA: Diagnosis not present

## 2017-05-18 DIAGNOSIS — Z510 Encounter for antineoplastic radiation therapy: Secondary | ICD-10-CM | POA: Diagnosis not present

## 2017-05-20 ENCOUNTER — Ambulatory Visit
Admission: RE | Admit: 2017-05-20 | Discharge: 2017-05-20 | Disposition: A | Discharge status: Home / Self Care | Payer: Medicare Other | Source: Ambulatory Visit | Attending: Radiation Oncology | Admitting: Radiation Oncology

## 2017-05-20 DIAGNOSIS — Z510 Encounter for antineoplastic radiation therapy: Secondary | ICD-10-CM | POA: Diagnosis not present

## 2017-05-23 ENCOUNTER — Ambulatory Visit
Admission: RE | Admit: 2017-05-23 | Discharge: 2017-05-23 | Disposition: A | Discharge status: Home / Self Care | Payer: Medicare Other | Source: Ambulatory Visit | Attending: Radiation Oncology | Admitting: Radiation Oncology

## 2017-05-23 DIAGNOSIS — Z510 Encounter for antineoplastic radiation therapy: Secondary | ICD-10-CM | POA: Diagnosis not present

## 2017-05-23 DIAGNOSIS — C773 Secondary and unspecified malignant neoplasm of axilla and upper limb lymph nodes: Secondary | ICD-10-CM

## 2017-05-23 MED ORDER — RADIAPLEXRX EX GEL
Freq: Once | CUTANEOUS | Status: AC
  Administered 2017-05-23: 11:00:00 via TOPICAL

## 2017-05-23 MED ORDER — ALRA NON-METALLIC DEODORANT (RAD-ONC)
1.0000 "application " | Freq: Once | TOPICAL | Status: AC
  Administered 2017-05-23: 1 via TOPICAL

## 2017-05-23 NOTE — Progress Notes (Signed)
Pt education done, my business card, booklet, alra deodorant and radiaplex gel cream, apply after rad tx and bedtime to left axilla area,  alra after rad tx  And prn, skin irritation and fatigue side effects, get plenty rest,sleep, exercise daily, increase protein in diet, unscented mild soap, luke warm water showering or bath, no rubbing,scrubbing or scratching that area, no shaving left axilla  sees MD weekly and prn Teach back given

## 2017-05-24 ENCOUNTER — Ambulatory Visit: Payer: Medicare Other

## 2017-05-25 ENCOUNTER — Ambulatory Visit
Admission: RE | Admit: 2017-05-25 | Discharge: 2017-05-25 | Disposition: A | Discharge status: Home / Self Care | Payer: Medicare Other | Source: Ambulatory Visit | Attending: Radiation Oncology | Admitting: Radiation Oncology

## 2017-05-25 DIAGNOSIS — Z510 Encounter for antineoplastic radiation therapy: Secondary | ICD-10-CM | POA: Diagnosis not present

## 2017-05-26 ENCOUNTER — Ambulatory Visit
Admission: RE | Admit: 2017-05-26 | Discharge: 2017-05-26 | Disposition: A | Discharge status: Home / Self Care | Payer: Medicare Other | Source: Ambulatory Visit | Attending: Radiation Oncology | Admitting: Radiation Oncology

## 2017-05-26 DIAGNOSIS — Z510 Encounter for antineoplastic radiation therapy: Secondary | ICD-10-CM | POA: Diagnosis not present

## 2017-05-27 ENCOUNTER — Ambulatory Visit
Admission: RE | Admit: 2017-05-27 | Discharge: 2017-05-27 | Disposition: A | Discharge status: Home / Self Care | Payer: Medicare Other | Source: Ambulatory Visit | Attending: Radiation Oncology | Admitting: Radiation Oncology

## 2017-05-27 DIAGNOSIS — Z510 Encounter for antineoplastic radiation therapy: Secondary | ICD-10-CM | POA: Diagnosis not present

## 2017-05-30 ENCOUNTER — Ambulatory Visit
Admission: RE | Admit: 2017-05-30 | Discharge: 2017-05-30 | Disposition: A | Discharge status: Home / Self Care | Payer: Medicare Other | Source: Ambulatory Visit | Attending: Radiation Oncology | Admitting: Radiation Oncology

## 2017-05-30 DIAGNOSIS — Z510 Encounter for antineoplastic radiation therapy: Secondary | ICD-10-CM | POA: Diagnosis not present

## 2017-05-31 ENCOUNTER — Ambulatory Visit
Admission: RE | Admit: 2017-05-31 | Discharge: 2017-05-31 | Disposition: A | Discharge status: Home / Self Care | Payer: Medicare Other | Source: Ambulatory Visit | Attending: Radiation Oncology | Admitting: Radiation Oncology

## 2017-05-31 DIAGNOSIS — Z510 Encounter for antineoplastic radiation therapy: Secondary | ICD-10-CM | POA: Diagnosis not present

## 2017-06-01 ENCOUNTER — Ambulatory Visit
Admission: RE | Admit: 2017-06-01 | Discharge: 2017-06-01 | Disposition: A | Discharge status: Home / Self Care | Payer: Medicare Other | Source: Ambulatory Visit | Attending: Radiation Oncology | Admitting: Radiation Oncology

## 2017-06-01 DIAGNOSIS — Z510 Encounter for antineoplastic radiation therapy: Secondary | ICD-10-CM | POA: Diagnosis not present

## 2017-06-02 ENCOUNTER — Ambulatory Visit
Admission: RE | Admit: 2017-06-02 | Discharge: 2017-06-02 | Disposition: A | Discharge status: Home / Self Care | Payer: Medicare Other | Source: Ambulatory Visit | Attending: Radiation Oncology | Admitting: Radiation Oncology

## 2017-06-02 DIAGNOSIS — Z510 Encounter for antineoplastic radiation therapy: Secondary | ICD-10-CM | POA: Diagnosis not present

## 2017-06-03 ENCOUNTER — Ambulatory Visit
Admission: RE | Admit: 2017-06-03 | Discharge: 2017-06-03 | Disposition: A | Discharge status: Home / Self Care | Payer: Medicare Other | Source: Ambulatory Visit | Attending: Radiation Oncology | Admitting: Radiation Oncology

## 2017-06-03 DIAGNOSIS — Z510 Encounter for antineoplastic radiation therapy: Secondary | ICD-10-CM | POA: Diagnosis not present

## 2017-06-03 NOTE — Progress Notes (Signed)
  Radiation Oncology         [phone] ________________________________  Name: Jennifer Garrett MRN: [id number]  Date: 05/12/2017  DOB: September 23, 1931  Optical Surface Tracking Plan:  Since intensity modulated radiotherapy (IMRT) and 3D conformal radiation treatment methods are predicated on accurate and precise positioning for treatment, intrafraction motion monitoring is medically necessary to ensure accurate and safe treatment delivery.  The ability to quantify intrafraction motion without excessive ionizing radiation dose can only be performed with optical surface tracking. Accordingly, surface imaging offers the opportunity to obtain 3D measurements of patient position throughout IMRT and 3D treatments without excessive radiation exposure.  I am ordering optical surface tracking for this patient's upcoming course of radiotherapy. ________________________________  Kyung Rudd, MD 06/03/2017 1:21 PM    Reference:   Ursula Alert, J, et al. Surface imaging-based analysis of intrafraction motion for breast radiotherapy patients.Journal of [place], n. 6, nov. 2014. ISSN [id number].   Available at: <http://www.jacmp.org/index.php/jacmp/article/view/4957>.

## 2017-06-03 NOTE — Progress Notes (Signed)
  Radiation Oncology         ([phone]) [phone] ________________________________  Name: Jennifer Garrett MRN: [id number]  Date: 05/12/2017  DOB: Jul 15, 1932  SIMULATION AND TREATMENT PLANNING NOTE  DIAGNOSIS:     ICD-10-CM   1. 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  Identity was confirmed.  All relevant records and images related to the planned course of therapy were reviewed.   Written consent to proceed with treatment was confirmed which was freely given after reviewing the details related to the planned course of therapy had been reviewed with the patient.  Then, the patient was set-up in a stable reproducible  supine position for radiation therapy.  CT images were obtained.  Surface markings were placed.    Medically necessary complex treatment device(s) for immobilization:  Vac lock bag.   The CT images were loaded into the planning software.  Then the target and avoidance structures were contoured.  Treatment planning then occurred.  The radiation prescription was entered and confirmed.  A total of 3 complex treatment devices were fabricated which relate to the designed radiation treatment fields, including a reduced field to improve dose homogeneity. Each of these customized fields/ complex treatment devices will be used on a daily basis during the radiation course. I have requested : 3D Simulation  I have requested a DVH of the following structures: target volume, lungs, spinal cord, heart.   PLAN:  The patient will receive 50 Gy in 25 fractions.  ________________________________   [name], MD, PhD

## 2017-06-05 ENCOUNTER — Ambulatory Visit
Admission: RE | Admit: 2017-06-05 | Discharge: 2017-06-05 | Disposition: A | Discharge status: Home / Self Care | Payer: Medicare Other | Source: Ambulatory Visit | Attending: Radiation Oncology | Admitting: Radiation Oncology

## 2017-06-05 DIAGNOSIS — Z510 Encounter for antineoplastic radiation therapy: Secondary | ICD-10-CM | POA: Diagnosis not present

## 2017-06-06 ENCOUNTER — Ambulatory Visit
Admission: RE | Admit: 2017-06-06 | Discharge: 2017-06-06 | Disposition: A | Discharge status: Home / Self Care | Payer: Medicare Other | Source: Ambulatory Visit | Attending: Radiation Oncology | Admitting: Radiation Oncology

## 2017-06-06 DIAGNOSIS — Z510 Encounter for antineoplastic radiation therapy: Secondary | ICD-10-CM | POA: Diagnosis not present

## 2017-06-07 ENCOUNTER — Ambulatory Visit
Admission: RE | Admit: 2017-06-07 | Discharge: 2017-06-07 | Disposition: A | Discharge status: Home / Self Care | Payer: Medicare Other | Source: Ambulatory Visit | Attending: Radiation Oncology | Admitting: Radiation Oncology

## 2017-06-07 DIAGNOSIS — Z510 Encounter for antineoplastic radiation therapy: Secondary | ICD-10-CM | POA: Diagnosis not present

## 2017-06-08 ENCOUNTER — Ambulatory Visit
Admission: RE | Admit: 2017-06-08 | Discharge: 2017-06-08 | Disposition: A | Discharge status: Home / Self Care | Payer: Medicare Other | Source: Ambulatory Visit | Attending: Radiation Oncology | Admitting: Radiation Oncology

## 2017-06-08 DIAGNOSIS — Z510 Encounter for antineoplastic radiation therapy: Secondary | ICD-10-CM | POA: Diagnosis not present

## 2017-06-13 ENCOUNTER — Ambulatory Visit
Admission: RE | Admit: 2017-06-13 | Discharge: 2017-06-13 | Disposition: A | Discharge status: Home / Self Care | Payer: Medicare Other | Source: Ambulatory Visit | Attending: Radiation Oncology | Admitting: Radiation Oncology

## 2017-06-13 DIAGNOSIS — Z510 Encounter for antineoplastic radiation therapy: Secondary | ICD-10-CM | POA: Diagnosis not present

## 2017-06-14 ENCOUNTER — Ambulatory Visit
Admission: RE | Admit: 2017-06-14 | Discharge: 2017-06-14 | Disposition: A | Discharge status: Home / Self Care | Payer: Medicare Other | Source: Ambulatory Visit | Attending: Radiation Oncology | Admitting: Radiation Oncology

## 2017-06-14 DIAGNOSIS — Z510 Encounter for antineoplastic radiation therapy: Secondary | ICD-10-CM | POA: Diagnosis not present

## 2017-06-15 ENCOUNTER — Ambulatory Visit
Admission: RE | Admit: 2017-06-15 | Discharge: 2017-06-15 | Disposition: A | Discharge status: Home / Self Care | Payer: Medicare Other | Source: Ambulatory Visit | Attending: Radiation Oncology | Admitting: Radiation Oncology

## 2017-06-15 DIAGNOSIS — Z510 Encounter for antineoplastic radiation therapy: Secondary | ICD-10-CM | POA: Diagnosis not present

## 2017-06-16 ENCOUNTER — Ambulatory Visit
Admission: RE | Admit: 2017-06-16 | Discharge: 2017-06-16 | Disposition: A | Discharge status: Home / Self Care | Payer: Medicare Other | Source: Ambulatory Visit | Attending: Radiation Oncology | Admitting: Radiation Oncology

## 2017-06-16 DIAGNOSIS — Z510 Encounter for antineoplastic radiation therapy: Secondary | ICD-10-CM | POA: Diagnosis not present

## 2017-06-17 ENCOUNTER — Ambulatory Visit
Admission: RE | Admit: 2017-06-17 | Discharge: 2017-06-17 | Disposition: A | Discharge status: Home / Self Care | Payer: Medicare Other | Source: Ambulatory Visit | Attending: Radiation Oncology | Admitting: Radiation Oncology

## 2017-06-17 DIAGNOSIS — Z510 Encounter for antineoplastic radiation therapy: Secondary | ICD-10-CM | POA: Diagnosis not present

## 2017-06-20 ENCOUNTER — Ambulatory Visit
Admission: RE | Admit: 2017-06-20 | Discharge: 2017-06-20 | Disposition: A | Discharge status: Home / Self Care | Payer: Medicare Other | Source: Ambulatory Visit | Attending: Radiation Oncology | Admitting: Radiation Oncology

## 2017-06-20 DIAGNOSIS — Z510 Encounter for antineoplastic radiation therapy: Secondary | ICD-10-CM | POA: Diagnosis not present

## 2017-06-21 ENCOUNTER — Ambulatory Visit (INDEPENDENT_AMBULATORY_CARE_PROVIDER_SITE_OTHER): Payer: Medicare Other | Admitting: Podiatry

## 2017-06-21 ENCOUNTER — Ambulatory Visit: Payer: Medicare Other

## 2017-06-21 ENCOUNTER — Encounter: Payer: Self-pay | Admitting: Podiatry

## 2017-06-21 DIAGNOSIS — M79609 Pain in unspecified limb: Secondary | ICD-10-CM

## 2017-06-21 DIAGNOSIS — E1140 Type 2 diabetes mellitus with diabetic neuropathy, unspecified: Secondary | ICD-10-CM

## 2017-06-21 DIAGNOSIS — B351 Tinea unguium: Secondary | ICD-10-CM | POA: Diagnosis not present

## 2017-06-21 NOTE — Progress Notes (Signed)
Patient ID: Jennifer Garrett, female   DOB: [birth year], 83 y.o.   MRN: [id number]   Subjective: This patient presents for scheduled visit complaining of toenails that are thick, long and uncomfortable walking wearing shoes and request toenail debridement Patient is a diabetic with a history of neuropathy  Objective: Orientated 3 DP and PT pulses are palpable bilaterally Reflex within normal limits bilaterally Decreased sensation to 10 g monofilament wire No open skin lesions bilaterally The toenails are hypertrophic, elongated, deformed and tender direct palpation 6-10 HAV bilaterally Dorsi flexion, plantar flexion 5/5 bilaterally  Assessment: Diabetic with peripheral neuropathy Symptomatic mycotic toenails  Plan: Debridement of toenails 6-10 mechanically an electrical without any bleeding  Reappoint 3 months

## 2017-06-21 NOTE — Patient Instructions (Signed)

## 2017-06-22 ENCOUNTER — Ambulatory Visit
Admission: RE | Admit: 2017-06-22 | Discharge: 2017-06-22 | Disposition: A | Discharge status: Home / Self Care | Payer: Medicare Other | Source: Ambulatory Visit | Attending: Radiation Oncology | Admitting: Radiation Oncology

## 2017-06-22 DIAGNOSIS — Z510 Encounter for antineoplastic radiation therapy: Secondary | ICD-10-CM | POA: Diagnosis not present

## 2017-06-23 ENCOUNTER — Ambulatory Visit
Admission: RE | Admit: 2017-06-23 | Discharge: 2017-06-23 | Disposition: A | Discharge status: Home / Self Care | Payer: Medicare Other | Source: Ambulatory Visit | Attending: Radiation Oncology | Admitting: Radiation Oncology

## 2017-06-23 DIAGNOSIS — Z510 Encounter for antineoplastic radiation therapy: Secondary | ICD-10-CM | POA: Diagnosis not present

## 2017-06-24 ENCOUNTER — Ambulatory Visit
Admission: RE | Admit: 2017-06-24 | Discharge: 2017-06-24 | Disposition: A | Discharge status: Home / Self Care | Payer: Medicare Other | Source: Ambulatory Visit | Attending: Radiation Oncology | Admitting: Radiation Oncology

## 2017-06-24 DIAGNOSIS — Z510 Encounter for antineoplastic radiation therapy: Secondary | ICD-10-CM | POA: Diagnosis not present

## 2017-06-27 ENCOUNTER — Ambulatory Visit: Payer: Medicare Other

## 2017-06-28 ENCOUNTER — Ambulatory Visit: Payer: Medicare Other

## 2017-06-29 ENCOUNTER — Ambulatory Visit
Admission: RE | Admit: 2017-06-29 | Discharge: 2017-06-29 | Disposition: A | Discharge status: Home / Self Care | Payer: Medicare Other | Source: Ambulatory Visit | Attending: Radiation Oncology | Admitting: Radiation Oncology

## 2017-06-29 ENCOUNTER — Ambulatory Visit: Payer: Medicare Other

## 2017-06-29 DIAGNOSIS — Z510 Encounter for antineoplastic radiation therapy: Secondary | ICD-10-CM | POA: Diagnosis not present

## 2017-06-30 ENCOUNTER — Ambulatory Visit
Admission: RE | Admit: 2017-06-30 | Discharge: 2017-06-30 | Disposition: A | Discharge status: Home / Self Care | Payer: Medicare Other | Source: Ambulatory Visit | Attending: Radiation Oncology | Admitting: Radiation Oncology

## 2017-06-30 ENCOUNTER — Ambulatory Visit: Payer: Medicare Other

## 2017-06-30 DIAGNOSIS — Z510 Encounter for antineoplastic radiation therapy: Secondary | ICD-10-CM | POA: Diagnosis not present

## 2017-07-01 ENCOUNTER — Ambulatory Visit
Admission: RE | Admit: 2017-07-01 | Discharge: 2017-07-01 | Disposition: A | Discharge status: Home / Self Care | Payer: Medicare Other | Source: Ambulatory Visit | Attending: Radiation Oncology | Admitting: Radiation Oncology

## 2017-07-01 ENCOUNTER — Encounter: Payer: Self-pay | Admitting: Radiation Oncology

## 2017-07-01 DIAGNOSIS — Z510 Encounter for antineoplastic radiation therapy: Secondary | ICD-10-CM | POA: Diagnosis not present

## 2017-07-05 NOTE — Progress Notes (Signed)
  Radiation Oncology         [phone]) [phone] ________________________________  Name: Jennifer Garrett MRN: [id number]  Date: 07/01/2017  DOB: Dec 05, 1931  End of Treatment Note  Diagnosis:    Stage IV T4, N1, M1 grade 3 triple negative, invasive ductal carcinoma of the right breast with left axillary adenopathy consistent with triple negative invasive ductal carcinoma.     Indication for treatment:  Palliative        Radiation treatment dates:   05/23/17 - 07/01/17  Site/dose:   Left axilla // 50 Gy in 25 fy  Beams/energy:   Photon // 3D  Narrative: The patient tolerated radiation treatment relatively well. Patient denied any pain and reported having mild fatigue. Skin is hyperpigmented, she reports using Radiaplex cream as directed. Blood pressure was elevated, she reports not taking her blood pressure medication as directed because she ran out of medication. Patient denied any headaches, numbness in extremities or chest pain. She reported having diarhhea, advised her to use Imodium.  Plan: The patient has completed radiation treatment. The patient will return to radiation oncology clinic for routine followup in one month. I advised them to call or return sooner if they have any questions or concerns related to their recovery or treatment.  ------------------------------------------------  [name], MD, PhD  This document serves as a record of services personally performed by Kyung Rudd MD. It was created on his behalf by Delton Coombes, a trained medical scribe. The creation of this record is based on the scribe's personal observations and the provider's statements to them.

## 2017-07-28 NOTE — Progress Notes (Signed)
.  [facility]  [phone] [phone]     ID: Jennifer Garrett DOB: October 29, 1931  MR#: [id number]  JJH#:[id number]  Patient Care Team: Jennifer Ebbs, MD as PCP - General (Internal Medicine) Jennifer Skates, MD as Consulting Physician (General Surgery) Jennifer Garrett, Jennifer Dad, MD as Consulting Physician (Oncology) Jennifer Breeding, MD as Consulting Physician (Cardiology) Jennifer Limbo, MD as Consulting Physician (Plastic Surgery) PCP: Jennifer Ebbs, MD  CHIEF COMPLAINT: Locally advanced triple negative breast cancer  CURRENT TREATMENT: Observation  INTERVAL HISTORY:  Jennifer Garrett returns today for follow-up of her triple negative breast cancer accompanied by her daughter. She completed radiation treatments as of 07/01/2017. She denies being fatigued or having blistered or irritated skin from radiation.    REVIEW OF SYSTEMS: Jennifer Garrett reports that she enjoyed the holidays and she ate good food.She reports that she isn't on any medication for sugar. She notes that her appetite has improved. She notes that she has left hip pain from a fall. She notes that she was outside in the garden and stepped back and hit the ground. She denies unusual headaches, visual changes, nausea, vomiting, or dizziness. There has been no unusual cough, phlegm production, or pleurisy. This been no change in bowel or bladder habits. She denies unexplained fatigue or unexplained weight loss, bleeding, rash, or fever. A detailed review of systems was otherwise stable.     BREAST CANCER HISTORY: From the original intake note:  The patient presented to the emergency room 11/20/2015 with a complaint of a mass in the lower aspect of her right breast, which was painful and bleeding. The patient stated the mass had been present approximately a month. An ultrasound of the breast was obtained which found a 4.4 cm mass in the right breast at the 6:00 position. This was read as most consistent with an abscess.  The patient was started on clindamycin and referred to the [facility], where on 01/05/2016 she underwent bilateral diagnostic mammography with tomography and right  ultrasonography.   Mammography showed far posteriorly in the 5:00 region of the right breast a 4.1 cm mass with enlarged axillary adenopathy. On exam there was a fungating ulcerated bleeding mass in the right breast at the 5:00 position 7 cm from the nipple. The right axilla was "full" on palpation. Ultrasound showed a 5.4 cm mass at the 5:00 position of the right breast 7 cm from the nipple, with at least 3 enlarged abnormal axillary lymph nodes, the largest measuring 3.7 cm.  Biopsy of the right breast mass and one axillary lymph node on 01/06/2016 showed ([id number]) both to be positive for an invasive ductal carcinoma, grade 3, triple negative, with the HER-2 signals ratio between 1.21-1.40 and the number per cell 2.85-3.00. The MIB-1-2 was 70%.  The patient's subsequent history is as detailed below    PAST MEDICAL HISTORY: Past Medical History:  Diagnosis Date  . Arthritis   . Asthma    rarely uses neb  . Breast cancer ([facility]) 05/25/2016   right breast  . Breast cancer of lower-inner quadrant of right female breast ([facility]) 01/22/2016  . Carpal tunnel syndrome   . Coronary artery disease    DES to OM3 90% stenosis 2003, 90% small RCA stenosis.    . Diabetes mellitus    not taken glipizide in over a year, checks surgars daily  . History of stomach ulcers   . Hyperlipemia   . Hypertension   . Irregular heart beat     PAST SURGICAL  HISTORY: Past Surgical History:  Procedure Laterality Date  . ABDOMINAL HYSTERECTOMY    . CARDIAC CATHETERIZATION  2003   stent-  . CARPAL TUNNEL RELEASE  03/29/2012   Procedure: CARPAL TUNNEL RELEASE;  Surgeon: Jennifer Garrett Sours, MD;  Location: [facility];  Service: Orthopedics;  Laterality: Left;  . EYE SURGERY     catracts  . LESION EXCISION WITH COMPLEX REPAIR Right  05/25/2016   Procedure: complex repair of 25cm wound;  Surgeon: Jennifer Limbo, MD;  Location: [facility];  Service: Plastics;  Laterality: Right;  . MASTECTOMY     right  . MASTECTOMY MODIFIED RADICAL Right 05/25/2016   Procedure: RIGHT MASTECTOMY MODIFIED RADICAL;  Surgeon: Jennifer Skates, MD;  Location: [facility];  Service: General;  Laterality: Right;  . PUNCH BIOPSY OF SKIN Left 04/04/2017   Procedure: PUNCH BIOPSY OF LEFT BREAST SKIN;  Surgeon: Jennifer Skates, MD;  Location: [facility];  Service: General;  Laterality: Left;  . RADIOACTIVE SEED GUIDED AXILLARY SENTINEL LYMPH NODE Left 04/04/2017   Procedure: RADIOACTIVE SEED TARGETED LEFT AXILLARY LYMPH NODE EXCISION;  Surgeon: Jennifer Skates, MD;  Location: [facility];  Service: General;  Laterality: Left;  . SHOULDER ARTHROSCOPY     right and left  . TRIGGER FINGER RELEASE  03/29/2012   Procedure: RELEASE TRIGGER FINGER/A-1 PULLEY;  Surgeon: Jennifer Garrett Sours, MD;  Location: [facility];  Service: Orthopedics;  Laterality: Left;    FAMILY HISTORY Family History  Problem Relation Age of Onset  . Heart disease Mother        No details   The patient's father died from "old age" at age 59. The patient's mother died at age 82 from complications of high blood pressure and diabetes. The patient had one brother, 4 sisters. There is no cancer in the family to her knowledge   GYNECOLOGIC HISTORY:  No LMP recorded. Patient has had a hysterectomy.  menarche age 89, first live birth age 29. The patient is GX P2. She underwent abdominal hysterectomy with bilateral salpingo-oophorectomy in her 24s. She did not undergo hormone replacement  SOCIAL HISTORY:  Jennifer Garrett used to work for [employer]. She lives with her daughter Jennifer Garrett, who is Medical sales representative for [employer]. The patient's son Jennifer Garrett lives in [place]. The patient has 1 grandchild, no great-grandchildren. She attends a local [place] DIRECTIVES:  not in place. At the 01/22/2016 visit the patient was given the appropriate documents to complete. She tells me she intends to name her daughter Jennifer Garrett as her healthcare part of attorney    HEALTH MAINTENANCE: Social History   Tobacco Use  . Smoking status: Former Smoker    Last attempt to quit: 03/24/1978    Years since quitting: 39.3  . Smokeless tobacco: Never Used  Substance Use Topics  . Alcohol use: No  . Drug use: No     Colonoscopy: Never   PAP: status post hysterectomy   Bone density: Never    Allergies  Allergen Reactions  . Lyrica [Pregabalin] Swelling    SWELLING REACTION UNSPECIFIED   . Feldene [Piroxicam] Rash  . Lisinopril Other (See Comments)    States that it makes her "sensitive to light"  . Orudis [Ketoprofen] Rash  . Vibramycin [Doxycycline Calcium] Rash and Hives    Current Outpatient Medications  Medication Sig Dispense Refill  . albuterol (PROVENTIL HFA;VENTOLIN HFA) 108 (90 BASE) MCG/ACT inhaler Inhale 1 puff into  the lungs every 6 (six) hours as needed for wheezing or shortness of breath. Reported on 02/04/2016    . aspirin EC 81 MG tablet Take 1 tablet by mouth every morning.    Marland Kitchen atorvastatin (LIPITOR) 40 MG tablet TAKE 1 TABLET (40 MG TOTAL) BY MOUTH DAILY. 90 tablet 3  . Blood Glucose Monitoring Suppl (ACCU-CHEK AVIVA PLUS) w/Device KIT 1 Device by Does not apply route 2 (two) times daily. 1 kit 0  . calcium carbonate (OS-CAL) 600 MG TABS Take 600 mg by mouth 2 (two) times daily with a meal.    . diclofenac sodium (VOLTAREN) 1 % GEL Apply 1 application topically every other day.    . donepezil (ARICEPT) 5 MG tablet Take 5 mg by mouth at bedtime.    . ferrous sulfate 325 (65 FE) MG tablet Take 1 tablet (325 mg total) by mouth 2 (two) times daily with a meal. 60 tablet 3  . glipiZIDE (GLUCOTROL) 10 MG tablet Take 10 mg by mouth daily as needed (for high blood sugar (greater than 150)).     Marland Kitchen glucose blood  (ACCU-CHEK AVIVA) test strip Use as instructed 100 each 12  . hyaluronate sodium (RADIAPLEXRX) GEL Apply 1 application 2 (two) times daily topically. Apply left axilla after rad txa nd bedtimes daily, nothing 4 hours prior to rad txs    . hydrochlorothiazide (MICROZIDE) 12.5 MG capsule Take 12.5 mg by mouth daily.    Marland Kitchen KLOR-CON M20 20 MEQ tablet Take 20 mEq by mouth daily.    . Lancets (ACCU-CHEK SOFT TOUCH) lancets Use as instructed 100 each 12  . naproxen sodium (ANAPROX) 220 MG tablet Take 220 mg by mouth 2 (two) times daily as needed (for pain.).    Marland Kitchen non-metallic deodorant (ALRA) MISC Apply 1 application topically. Apply after  Rad tx and prn    . olmesartan (BENICAR) 40 MG tablet Take 40 mg by mouth daily.     No current facility-administered medications for this visit.     OBJECTIVE: Older African-American woman walking with a mild limp   Vitals:   08/01/17 1529  BP: (!) 168/80  Pulse: (!) 59  Resp: 20  Temp: 98.3 F (36.8 C)  SpO2: 98%     Body mass index is 30.78 kg/m.      ECOG FS:1 - Symptomatic but completely ambulatory  Sclerae unicteric, pupils round and equal No cervical or supraclavicular adenopathy Lungs no rales or rhonchi Heart regular rate and rhythm Abd soft, nontender, positive bowel sounds MSK no focal spinal tenderness, no upper extremity lymphedema, discomfort on weightbearing on right Neuro: nonfocal, well oriented, appropriate affect Breasts: The right breast is status post mastectomy followed by radiation.  There is no evidence of chest wall recurrence.  The left breast is unremarkable.  The left axilla is status post radiation.  There is no evidence of local recurrence  LAB RESULTS:  CMP     Component Value Date/Time   NA 143 05/04/2017 1418   K 4.2 05/04/2017 1418   CL 109 03/30/2017 0919   CO2 28 05/04/2017 1418   GLUCOSE 125 05/04/2017 1418   BUN 22.7 05/04/2017 1418   CREATININE 1.1 05/04/2017 1418   CALCIUM 9.3 05/04/2017 1418   PROT  7.2 05/04/2017 1418   ALBUMIN 3.3 (L) 05/04/2017 1418   AST 20 05/04/2017 1418   ALT 11 05/04/2017 1418   ALKPHOS 67 05/04/2017 1418   BILITOT 0.32 05/04/2017 1418   GFRNONAA 52 (L) 03/30/2017 0919  GFRAA 60 (L) 03/30/2017 0919    INo results found for: SPEP, UPEP  Lab Results  Component Value Date   WBC 4.0 08/01/2017   NEUTROABS 2.0 08/01/2017   HGB 11.0 (L) 08/01/2017   HCT 34.5 (L) 08/01/2017   MCV 87.0 08/01/2017   PLT 225 08/01/2017      Chemistry      Component Value Date/Time   NA 143 05/04/2017 1418   K 4.2 05/04/2017 1418   CL 109 03/30/2017 0919   CO2 28 05/04/2017 1418   BUN 22.7 05/04/2017 1418   CREATININE 1.1 05/04/2017 1418      Component Value Date/Time   CALCIUM 9.3 05/04/2017 1418   ALKPHOS 67 05/04/2017 1418   AST 20 05/04/2017 1418   ALT 11 05/04/2017 1418   BILITOT 0.32 05/04/2017 1418       No results found for: LABCA2  No components found for: LABCA125  No results for input(s): INR in the last 168 hours.  Urinalysis    Component Value Date/Time   COLORURINE YELLOW 05/19/2016 1138   APPEARANCEUR CLEAR 05/19/2016 1138   LABSPEC 1.017 05/19/2016 1138   PHURINE 5.5 05/19/2016 1138   GLUCOSEU NEGATIVE 05/19/2016 1138   HGBUR NEGATIVE 05/19/2016 1138   BILIRUBINUR NEGATIVE 05/19/2016 1138   KETONESUR NEGATIVE 05/19/2016 1138   PROTEINUR NEGATIVE 05/19/2016 1138   UROBILINOGEN 1.0 11/28/2013 0143   NITRITE NEGATIVE 05/19/2016 1138   LEUKOCYTESUR NEGATIVE 05/19/2016 1138     STUDIES: We will obtain plain films of her left hip today  ASSESSMENT: 84 y.o. [place] woman status post right breast lower outer quadrant and right axillary lymph node biopsy 01/06/2016 both positive for a clinical T4 N1 M1, stage IV invasive ductal carcinoma, grade 3, triple negative, with an MIB-1 of 70%  (a) CT scan of the chest and bone scan 01/27/2016 showed scattered pulmonary nodules in addition to locally advanced disease  (b) repeat CT scan of  the chest 04/12/2016 shows persistent pulmonary nodules, progression in local disease  (c) CA-27-29 is noninformative  (1) neoadjuvant chemotherapy consisting of capecitabine, 1500 mg twice daily, 7 days on 7 days off, starting 01/27/2016, discontinued 04/14/2016 with progression  (2) right modified radical mastectomy 05/25/2016 showed a pT3 pN2, stage IIIA invasive ductal carcinoma, grade 3, with negative margins.  (3) adjuvant radiation 08/24/16- 10/07/16 Site/dose:   1) Right chest wall / 50.4 Gy in 28 fractions                         2) Right chest wall boost / 10 Gy in 5 fractions  (4) restaging studies and follow-up:  (a) PET scan 08/24/2016 was negative except for a 1.0 cm LEFT axillary lymph node  (b)  biopsy of the left axillary lymph node 02/22/2017 showed breast carcinoma, again triple negative  (5) on 04/04/2017 she underwent removal of the left axillary mass (measuring 4.8 cm,) together with 2 benign lymph nodes.  (a) skin punch biopsy on the same day was benign  (6) adjuvant radiation to the left axillary tumor bed completed 07/01/2017:  Site/dose:   Left axilla / 50 Gy in 25 fy  PLAN: Leveta is now a year and a half out from definitive diagnosis breast cancer and 4 months out from her most recent surgery she has recovered well from her surgeries and from the radiation and has a normal functional status for her.  She remains at very high risk for disease recurrence/progression.  I am  going to set her up for a CT of the chest before her return visit here in 3 months but I have encouraged him to call with any questions or concerns before that.  I think the pain in her left hip is going to be related to her recent fall and not to cancer.  Nevertheless we will obtain plain films today to show there is no fracture and also to make sure secondarily that there is no metastatic disease there  She will return to see me in 3 months.  She knows to call for any other issues that may  develop before then.   Zeeshan Korte, Jennifer Dad, MD  08/01/17 3:49 PM Medical Oncology and Hematology [facility] [address] [place], [place] [address] Tel. [phone]    Fax. [phone]  This document serves as a record of services personally performed by Lurline Del, MD. It was created on his behalf by Sheron Nightingale, a trained medical scribe. The creation of this record is based on the scribe's personal observations and the provider's statements to them.   I have reviewed the above documentation for accuracy and completeness, and I agree with the above.

## 2017-08-01 ENCOUNTER — Ambulatory Visit (HOSPITAL_COMMUNITY)
Admission: RE | Admit: 2017-08-01 | Discharge: 2017-08-01 | Disposition: A | Discharge status: Home / Self Care | Payer: Medicare Other | Source: Ambulatory Visit | Attending: Oncology | Admitting: Oncology

## 2017-08-01 ENCOUNTER — Inpatient Hospital Stay: Payer: Medicare Other | Attending: Oncology | Admitting: Oncology

## 2017-08-01 ENCOUNTER — Inpatient Hospital Stay: Payer: Medicare Other

## 2017-08-01 VITALS — BP 168/80 | HR 59 | Temp 98.3°F | Resp 20 | Ht 60.0 in | Wt 157.6 lb

## 2017-08-01 DIAGNOSIS — W19XXXA Unspecified fall, initial encounter: Secondary | ICD-10-CM | POA: Insufficient documentation

## 2017-08-01 DIAGNOSIS — C7951 Secondary malignant neoplasm of bone: Secondary | ICD-10-CM | POA: Diagnosis not present

## 2017-08-01 DIAGNOSIS — M25552 Pain in left hip: Secondary | ICD-10-CM | POA: Insufficient documentation

## 2017-08-01 DIAGNOSIS — Z171 Estrogen receptor negative status [ER-]: Secondary | ICD-10-CM

## 2017-08-01 DIAGNOSIS — M1612 Unilateral primary osteoarthritis, left hip: Secondary | ICD-10-CM | POA: Diagnosis not present

## 2017-08-01 DIAGNOSIS — C773 Secondary and unspecified malignant neoplasm of axilla and upper limb lymph nodes: Secondary | ICD-10-CM | POA: Insufficient documentation

## 2017-08-01 DIAGNOSIS — C7800 Secondary malignant neoplasm of unspecified lung: Secondary | ICD-10-CM | POA: Diagnosis not present

## 2017-08-01 DIAGNOSIS — C50311 Malignant neoplasm of lower-inner quadrant of right female breast: Secondary | ICD-10-CM | POA: Diagnosis present

## 2017-08-01 DIAGNOSIS — Z87891 Personal history of nicotine dependence: Secondary | ICD-10-CM | POA: Diagnosis not present

## 2017-08-01 LAB — COMPREHENSIVE METABOLIC PANEL
ALBUMIN: 3.6 g/dL (ref 3.5–5.0)
ALT: 12 U/L (ref 0–55)
AST: 18 U/L (ref 5–34)
Alkaline Phosphatase: 77 U/L (ref 40–150)
Anion gap: 7 (ref 3–11)
BUN: 15 mg/dL (ref 7–26)
CHLORIDE: 109 mmol/L (ref 98–109)
CO2: 26 mmol/L (ref 22–29)
CREATININE: 1.18 mg/dL — AB (ref 0.60–1.10)
Calcium: 9.9 mg/dL (ref 8.4–10.4)
GFR calc Af Amer: 48 mL/min — ABNORMAL LOW (ref >60)
GFR, EST NON AFRICAN AMERICAN: 41 mL/min — AB (ref >60)
Glucose, Bld: 125 mg/dL (ref 70–140)
Potassium: 5.3 mmol/L — ABNORMAL HIGH (ref 3.3–4.7)
Sodium: 142 mmol/L (ref 136–145)
Total Bilirubin: 0.3 mg/dL (ref 0.2–1.2)
Total Protein: 7.5 g/dL (ref 6.4–8.3)

## 2017-08-01 LAB — CBC WITH DIFFERENTIAL/PLATELET
Basophils Absolute: 0.1 10*3/uL (ref 0.0–0.1)
Basophils Relative: 1 %
EOS ABS: 0.1 10*3/uL (ref 0.0–0.5)
Eosinophils Relative: 3 %
HCT: 34.5 % — ABNORMAL LOW (ref 34.8–46.6)
Hemoglobin: 11 g/dL — ABNORMAL LOW (ref 11.6–15.9)
LYMPHS ABS: 1.2 10*3/uL (ref 0.9–3.3)
LYMPHS PCT: 29 %
MCH: 27.7 pg (ref 25.1–34.0)
MCHC: 31.9 g/dL (ref 31.5–36.0)
MCV: 87 fL (ref 79.5–101.0)
Monocytes Absolute: 0.7 10*3/uL (ref 0.1–0.9)
Monocytes Relative: 17 %
Neutro Abs: 2 10*3/uL (ref 1.5–6.5)
Neutrophils Relative %: 50 %
PLATELETS: 225 10*3/uL (ref 145–400)
RBC: 3.97 MIL/uL (ref 3.70–5.45)
RDW: 13.3 % (ref 11.2–16.1)
WBC: 4 10*3/uL (ref 3.9–10.3)

## 2017-08-02 ENCOUNTER — Telehealth: Payer: Self-pay | Admitting: Oncology

## 2017-08-02 NOTE — Telephone Encounter (Signed)
Scheduled appts per 1/14 los - sending confirmation letter in the mail with the letters.

## 2017-08-10 ENCOUNTER — Ambulatory Visit
Admission: RE | Admit: 2017-08-10 | Discharge: 2017-08-10 | Disposition: A | Discharge status: Home / Self Care | Payer: Medicare Other | Source: Ambulatory Visit | Attending: Radiation Oncology | Admitting: Radiation Oncology

## 2017-08-12 ENCOUNTER — Telehealth: Payer: Self-pay | Admitting: *Deleted

## 2017-08-12 NOTE — Telephone Encounter (Signed)
Called patient's daughter, [name] to reschedule missed fu, lvm for a return call

## 2017-08-17 ENCOUNTER — Other Ambulatory Visit: Payer: Self-pay | Admitting: Internal Medicine

## 2017-08-17 DIAGNOSIS — E2839 Other primary ovarian failure: Secondary | ICD-10-CM

## 2017-08-22 ENCOUNTER — Other Ambulatory Visit: Payer: Self-pay

## 2017-08-22 ENCOUNTER — Ambulatory Visit
Admission: RE | Admit: 2017-08-22 | Discharge: 2017-08-22 | Disposition: A | Discharge status: Home / Self Care | Payer: Medicare Other | Source: Ambulatory Visit | Attending: Radiation Oncology | Admitting: Radiation Oncology

## 2017-08-22 ENCOUNTER — Encounter: Payer: Self-pay | Admitting: Radiation Oncology

## 2017-08-22 VITALS — BP 140/90 | HR 60 | Temp 98.1°F | Resp 18 | Ht <= 58 in | Wt 155.4 lb

## 2017-08-22 DIAGNOSIS — Z7982 Long term (current) use of aspirin: Secondary | ICD-10-CM | POA: Diagnosis not present

## 2017-08-22 DIAGNOSIS — M1612 Unilateral primary osteoarthritis, left hip: Secondary | ICD-10-CM | POA: Diagnosis not present

## 2017-08-22 DIAGNOSIS — C50911 Malignant neoplasm of unspecified site of right female breast: Secondary | ICD-10-CM | POA: Diagnosis not present

## 2017-08-22 DIAGNOSIS — Z79899 Other long term (current) drug therapy: Secondary | ICD-10-CM | POA: Insufficient documentation

## 2017-08-22 DIAGNOSIS — Z791 Long term (current) use of non-steroidal anti-inflammatories (NSAID): Secondary | ICD-10-CM | POA: Insufficient documentation

## 2017-08-22 DIAGNOSIS — R590 Localized enlarged lymph nodes: Secondary | ICD-10-CM | POA: Diagnosis not present

## 2017-08-22 DIAGNOSIS — Z171 Estrogen receptor negative status [ER-]: Secondary | ICD-10-CM | POA: Diagnosis not present

## 2017-08-22 DIAGNOSIS — Z888 Allergy status to other drugs, medicaments and biological substances status: Secondary | ICD-10-CM | POA: Insufficient documentation

## 2017-08-22 DIAGNOSIS — C50311 Malignant neoplasm of lower-inner quadrant of right female breast: Secondary | ICD-10-CM

## 2017-08-22 DIAGNOSIS — C773 Secondary and unspecified malignant neoplasm of axilla and upper limb lymph nodes: Secondary | ICD-10-CM

## 2017-08-22 NOTE — Progress Notes (Signed)
Radiation Oncology         [phone]) [phone] ________________________________  Name: Jennifer Garrett MRN: [id number]  Date of Service: 08/22/2017  DOB: 21-Aug-1931  Post Treatment Note  CC: Nolene Ebbs, MD  Magrinat, Virgie Dad, MD  Diagnosis:   Stage IV T4, N1, M1 grade 3 triple negative, invasive ductal carcinoma of the right breast with left axillary adenopathy consistent with triple negative invasive ductal carcinoma.      Interval Since Last Radiation:  6 weeks    05/23/17 - 07/01/17: Left axilla // 50 Gy in 25 fy   08/24/16- 10/07/16: 1.  Right chest wall / 50.4 Gy in 28 fractions 2.   Right chest wall boost / 10 Gy in 5 fractions  Diagnosis:   Stage IV T4, N1, M1 grade 3 triple negative, invasive ductal carcinoma of the right breast with left axillary adenopathy consistent with triple negative invasive ductal carcinoma.      Interval Since Last Radiation:  6 weeks    05/23/17 - 07/01/17: Left axilla // 50 Gy in 25 fy   08/24/16- 10/07/16: 1.  Right chest wall / 50.4 Gy in 28 fractions 2.   Right chest wall boost / 10 Gy in 5 fractions  Narrative:  The patient returns today for routine follow-up. During treatment she did very well with radiotherapy and did not have significant desquamation.                            On review of systems, the patient states she's doing well. She reports occasional discomfort of her left axilla but denies any pain.   ALLERGIES:  is allergic to lyrica [pregabalin]; feldene [piroxicam]; lisinopril; orudis [ketoprofen]; and vibramycin [doxycycline calcium].  Meds: Current Outpatient Medications  Medication Sig Dispense Refill  . albuterol (PROVENTIL HFA;VENTOLIN HFA) 108 (90 BASE) MCG/ACT inhaler Inhale 1 puff into the lungs every 6 (six) hours as needed for wheezing or shortness of breath. Reported on 02/04/2016    . aspirin EC 81 MG tablet Take 1 tablet by mouth every morning.    . calcium carbonate (OS-CAL) 600 MG TABS Take 600 mg by mouth 2  (two) times daily with a meal.    . diclofenac sodium (VOLTAREN) 1 % GEL Apply 1 application topically every other day.    . donepezil (ARICEPT) 5 MG tablet Take 5 mg by mouth at bedtime.    . ferrous sulfate 325 (65 FE) MG tablet Take 1 tablet (325 mg total) by mouth 2 (two) times daily with a meal. 60 tablet 3  . hydrochlorothiazide (MICROZIDE) 12.5 MG capsule Take 12.5 mg by mouth daily.    Marland Kitchen KLOR-CON M20 20 MEQ tablet Take 20 mEq by mouth daily.    . naproxen sodium (ANAPROX) 220 MG tablet Take 220 mg by mouth 2 (two) times daily as needed (for pain.).    Marland Kitchen olmesartan (BENICAR) 40 MG tablet Take 40 mg by mouth daily.    Marland Kitchen atorvastatin (LIPITOR) 40 MG tablet TAKE 1 TABLET (40 MG TOTAL) BY MOUTH DAILY. 90 tablet 3   No current facility-administered medications for this encounter.     Physical Findings:  height is 5" (0.127 m) (abnormal) and weight is 155 lb 6.4 oz (70.5 kg). Her oral temperature is 98.1 F (36.7 C). Her blood pressure is 140/90 and her pulse is 60. Her respiration is 18 and oxygen saturation is 100%.  Pain Assessment Pain Score: 5  Pain Loc: (  left hip)/10 In general this is a well appearing African American in no acute distress. She's alert and oriented x4 and appropriate throughout the examination. Cardiopulmonary assessment is negative for acute distress and she has mild hyperpigmentation along the upper left chest wall and axilla. No desquamation is noted. No adenopathy is noted.  Lab Findings: Lab Results  Component Value Date   WBC 4.0 08/01/2017   HGB 11.0 (L) 08/01/2017   HCT 34.5 (L) 08/01/2017   MCV 87.0 08/01/2017   PLT 225 08/01/2017     Radiographic Findings: Dg Hip Unilat With Pelvis 2-3 Views Left  Result Date: 08/01/2017 CLINICAL DATA:  84 year old who recently fell and has left hip pain. Initial encounter. Personal history of breast cancer. EXAM: DG HIP (WITH OR WITHOUT PELVIS) 2-3V LEFT COMPARISON:  Bone window images from PET-CT 08/24/2016.  FINDINGS: No evidence of acute fracture or dislocation. Mild axial joint space narrowing as noted on the prior PET-CT. Well-preserved bone mineral density for age. Included AP pelvis demonstrates a well-preserved joint space in the contralateral right hip. Sacroiliac joints and symphysis pubis intact. Degenerative changes involving the visualized lower lumbar spine. IMPRESSION: No acute osseous abnormality. Mild osteoarthritis involving the left hip. Electronically Signed   By: Evangeline Dakin M.D.   On: 08/01/2017 17:55    Impression/Plan: 1. Stage IV T4, N1, M1 grade 3 triple negative, invasive ductal carcinoma of the right breast with left axillary adenopathy consistent with triple negative invasive ductal carcinoma.  The patient has been doing well since completion of radiotherapy. We discussed that we would be happy to continue to follow her as needed, but she will also continue to follow up with Dr. Jana Hakim in medical oncology. She was counseled on skin care as well as measures to avoid sun exposure to this area.       Carola Rhine, PAC

## 2017-09-06 ENCOUNTER — Ambulatory Visit
Admission: RE | Admit: 2017-09-06 | Discharge: 2017-09-06 | Disposition: A | Discharge status: Home / Self Care | Payer: Medicare Other | Source: Ambulatory Visit | Attending: Internal Medicine | Admitting: Internal Medicine

## 2017-09-06 DIAGNOSIS — E2839 Other primary ovarian failure: Secondary | ICD-10-CM

## 2017-09-19 ENCOUNTER — Ambulatory Visit: Payer: Medicare Other | Admitting: Podiatry

## 2017-09-20 ENCOUNTER — Ambulatory Visit (INDEPENDENT_AMBULATORY_CARE_PROVIDER_SITE_OTHER): Payer: Medicare Other | Admitting: Podiatry

## 2017-09-20 ENCOUNTER — Encounter: Payer: Self-pay | Admitting: Podiatry

## 2017-09-20 DIAGNOSIS — M79609 Pain in unspecified limb: Secondary | ICD-10-CM | POA: Diagnosis not present

## 2017-09-20 DIAGNOSIS — B351 Tinea unguium: Secondary | ICD-10-CM

## 2017-09-20 DIAGNOSIS — E1140 Type 2 diabetes mellitus with diabetic neuropathy, unspecified: Secondary | ICD-10-CM

## 2017-09-20 DIAGNOSIS — M2010 Hallux valgus (acquired), unspecified foot: Secondary | ICD-10-CM

## 2017-09-20 NOTE — Progress Notes (Signed)
Patient ID: Jennifer Garrett, female   DOB: 03-03-1932, 84 y.o.   MRN: [id number] Complaint:  Visit Type: Patient returns to my office for continued preventative foot care services. Complaint: Patient states" my nails have grown long and thick and become painful to walk and wear shoes" Patient has been diagnosed with DM with neuropathy.. The patient presents for preventative foot care services. No changes to ROS  Podiatric Exam: Vascular: dorsalis pedis and posterior tibial pulses are palpable bilateral. Capillary return is immediate. Temperature gradient is WNL. Skin turgor WNL  Sensorium: Decreased  Semmes Weinstein monofilament test. Normal tactile sensation bilaterally. Nail Exam: Pt has thick disfigured discolored nails with subungual debris noted bilateral entire nail hallux through fifth toenails Ulcer Exam: There is no evidence of ulcer or pre-ulcerative changes or infection. Orthopedic Exam: Muscle tone and strength are WNL. No limitations in general ROM. No crepitus or effusions noted. HAV  B/L. Skin: No Porokeratosis. No infection or ulcers  Diagnosis:  Onychomycosis, ,Diabetes with neuropathy.  HAV  B/L.  Treatment & Plan Procedures and Treatment: Consent by patient was obtained for treatment procedures. The patient understood the discussion of treatment and procedures well. All questions were answered thoroughly reviewed. Debridement of mycotic and hypertrophic toenails, 1 through 5 bilateral and clearing of subungual debris. No ulceration, no infection noted. Re- Initiate diabetic footgear paperwork for DPN and HAV  B/L. ABN signed for 2019. Return Visit-Office Procedure: Patient instructed to return to the office for a follow up visit 3 months for continued evaluation and treatment.    Gardiner Barefoot DPM

## 2017-10-27 ENCOUNTER — Inpatient Hospital Stay: Payer: Medicare Other | Attending: Oncology

## 2017-10-27 ENCOUNTER — Other Ambulatory Visit: Payer: Self-pay | Admitting: *Deleted

## 2017-10-27 ENCOUNTER — Ambulatory Visit (HOSPITAL_COMMUNITY)
Admission: RE | Admit: 2017-10-27 | Discharge: 2017-10-27 | Disposition: A | Discharge status: Home / Self Care | Payer: Medicare Other | Source: Ambulatory Visit | Attending: Oncology | Admitting: Oncology

## 2017-10-27 DIAGNOSIS — C773 Secondary and unspecified malignant neoplasm of axilla and upper limb lymph nodes: Secondary | ICD-10-CM

## 2017-10-27 DIAGNOSIS — C50311 Malignant neoplasm of lower-inner quadrant of right female breast: Secondary | ICD-10-CM | POA: Insufficient documentation

## 2017-10-27 DIAGNOSIS — C7951 Secondary malignant neoplasm of bone: Secondary | ICD-10-CM | POA: Insufficient documentation

## 2017-10-27 DIAGNOSIS — I2510 Atherosclerotic heart disease of native coronary artery without angina pectoris: Secondary | ICD-10-CM | POA: Insufficient documentation

## 2017-10-27 DIAGNOSIS — Z171 Estrogen receptor negative status [ER-]: Secondary | ICD-10-CM

## 2017-10-27 DIAGNOSIS — I10 Essential (primary) hypertension: Secondary | ICD-10-CM | POA: Insufficient documentation

## 2017-10-27 DIAGNOSIS — Z87891 Personal history of nicotine dependence: Secondary | ICD-10-CM | POA: Insufficient documentation

## 2017-10-27 DIAGNOSIS — C7800 Secondary malignant neoplasm of unspecified lung: Secondary | ICD-10-CM

## 2017-10-27 DIAGNOSIS — I700 Atherosclerosis of aorta: Secondary | ICD-10-CM | POA: Diagnosis not present

## 2017-10-27 DIAGNOSIS — E119 Type 2 diabetes mellitus without complications: Secondary | ICD-10-CM | POA: Insufficient documentation

## 2017-10-27 LAB — CBC WITH DIFFERENTIAL (CANCER CENTER ONLY)
Basophils Absolute: 0.1 10*3/uL (ref 0.0–0.1)
Basophils Relative: 1 %
EOS PCT: 4 %
Eosinophils Absolute: 0.2 10*3/uL (ref 0.0–0.5)
HEMATOCRIT: 35.4 % (ref 34.8–46.6)
HEMOGLOBIN: 11.4 g/dL — AB (ref 11.6–15.9)
LYMPHS ABS: 1.4 10*3/uL (ref 0.9–3.3)
LYMPHS PCT: 32 %
MCH: 28.1 pg (ref 25.1–34.0)
MCHC: 32.2 g/dL (ref 31.5–36.0)
MCV: 87.4 fL (ref 79.5–101.0)
Monocytes Absolute: 0.6 10*3/uL (ref 0.1–0.9)
Monocytes Relative: 14 %
NEUTROS ABS: 2.2 10*3/uL (ref 1.5–6.5)
Neutrophils Relative %: 49 %
PLATELETS: 179 10*3/uL (ref 145–400)
RBC: 4.05 MIL/uL (ref 3.70–5.45)
RDW: 12.8 % (ref 11.2–14.5)
WBC: 4.3 10*3/uL (ref 3.9–10.3)

## 2017-10-27 LAB — CMP (CANCER CENTER ONLY)
ALT: 8 U/L (ref 0–55)
ANION GAP: 8 (ref 3–11)
AST: 17 U/L (ref 5–34)
Albumin: 3.5 g/dL (ref 3.5–5.0)
Alkaline Phosphatase: 73 U/L (ref 40–150)
BUN: 16 mg/dL (ref 7–26)
CHLORIDE: 107 mmol/L (ref 98–109)
CO2: 25 mmol/L (ref 22–29)
Calcium: 9.5 mg/dL (ref 8.4–10.4)
Creatinine: 1.13 mg/dL — ABNORMAL HIGH (ref 0.60–1.10)
GFR, EST NON AFRICAN AMERICAN: 44 mL/min — AB (ref >60)
GFR, Est AFR Am: 51 mL/min — ABNORMAL LOW (ref >60)
Glucose, Bld: 99 mg/dL (ref 70–140)
POTASSIUM: 4.7 mmol/L (ref 3.5–5.1)
Sodium: 140 mmol/L (ref 136–145)
Total Bilirubin: 0.4 mg/dL (ref 0.2–1.2)
Total Protein: 7.2 g/dL (ref 6.4–8.3)

## 2017-10-27 MED ORDER — IOHEXOL 300 MG/ML  SOLN
75.0000 mL | Freq: Once | INTRAMUSCULAR | Status: AC | PRN
  Administered 2017-10-27: 75 mL via INTRAVENOUS

## 2017-10-31 NOTE — Progress Notes (Signed)
[facility]  Telephone:(336) [phone] Fax:(336) [phone]     ID: JAREN KEARN DOB: [birth year]  MR#: [id number]  IRS#:[id number]  Patient Care Team: Nolene Ebbs, MD as PCP - General (Internal Medicine) Fanny Skates, MD as Consulting Physician (General Surgery) Magrinat, Virgie Dad, MD as Consulting Physician (Oncology) Minus Breeding, MD as Consulting Physician (Cardiology) Irene Limbo, MD as Consulting Physician (Plastic Surgery) PCP: Nolene Ebbs, MD  CHIEF COMPLAINT: Locally advanced triple negative breast cancer  CURRENT TREATMENT: Observation  INTERVAL HISTORY:  Jennifer Garrett returns today for follow-up of her triple negative breast cancer accompanied by a family friend. She is under observation.  Since her last visit to the office, she underwent a restaging CT chest with contrast on 10/27/2017 with results of: No evidence of breast cancer recurrence. Coronary artery calcification and Aortic Atherosclerosis (ICD10-I70.0).  She also had a bone density scan completed on 09/06/2017 with a T-score of -0.2 at the left femur neck.    REVIEW OF SYSTEMS: Jennifer Garrett reports that she stays around the house and relaxes. Her appetite and taste is good. She denies constipation or diarrhea. She is having some allergy issues, but she denies coughing. She has pain on both sides of her right breast. She denies unusual headaches, visual changes, nausea, vomiting, or dizziness. There has been no unusual cough, phlegm production, or pleurisy. This been no change in bowel or bladder habits. She denies unexplained fatigue or unexplained weight loss, bleeding, rash, or fever. A detailed review of systems was otherwise stable.      BREAST CANCER HISTORY: From the original intake note:  The patient presented to the emergency room 11/20/2015 with a complaint of a mass in the lower aspect of her right breast, which was painful and bleeding. The patient stated the mass had been present  approximately a month. An ultrasound of the breast was obtained which found a 4.4 cm mass in the right breast at the 6:00 position. This was read as most consistent with an abscess. The patient was started on clindamycin and referred to the [facility], where on 01/05/2016 she underwent bilateral diagnostic mammography with tomography and right  ultrasonography.   Mammography showed far posteriorly in the 5:00 region of the right breast a 4.1 cm mass with enlarged axillary adenopathy. On exam there was a fungating ulcerated bleeding mass in the right breast at the 5:00 position 7 cm from the nipple. The right axilla was "full" on palpation. Ultrasound showed a 5.4 cm mass at the 5:00 position of the right breast 7 cm from the nipple, with at least 3 enlarged abnormal axillary lymph nodes, the largest measuring 3.7 cm.  Biopsy of the right breast mass and one axillary lymph node on 01/06/2016 showed ([id number]) both to be positive for an invasive ductal carcinoma, grade 3, triple negative, with the HER-2 signals ratio between 1.21-1.40 and the number per cell 2.85-3.00. The MIB-1-2 was 70%.  The patient's subsequent history is as detailed below    PAST MEDICAL HISTORY: Past Medical History:  Diagnosis Date  . Arthritis   . Asthma    rarely uses neb  . Breast cancer ([facility]) 05/25/2016   right breast  . Breast cancer of lower-inner quadrant of right female breast ([facility]) 01/22/2016  . Carpal tunnel syndrome   . Coronary artery disease    DES to OM3 90% stenosis 2003, 90% small RCA stenosis.    . Diabetes mellitus    not taken glipizide in over a year, checks surgars  daily  . History of stomach ulcers   . Hyperlipemia   . Hypertension   . Irregular heart beat     PAST SURGICAL HISTORY: Past Surgical History:  Procedure Laterality Date  . ABDOMINAL HYSTERECTOMY    . CARDIAC CATHETERIZATION  2003   stent-  . CARPAL TUNNEL RELEASE  03/29/2012   Procedure: CARPAL TUNNEL RELEASE;  Surgeon:  Wynonia Sours, MD;  Location: [facility];  Service: Orthopedics;  Laterality: Left;  . EYE SURGERY     catracts  . LESION EXCISION WITH COMPLEX REPAIR Right 05/25/2016   Procedure: complex repair of 25cm wound;  Surgeon: Irene Limbo, MD;  Location: [facility];  Service: Plastics;  Laterality: Right;  . MASTECTOMY     right  . MASTECTOMY MODIFIED RADICAL Right 05/25/2016   Procedure: RIGHT MASTECTOMY MODIFIED RADICAL;  Surgeon: Fanny Skates, MD;  Location: [facility];  Service: General;  Laterality: Right;  . PUNCH BIOPSY OF SKIN Left 04/04/2017   Procedure: PUNCH BIOPSY OF LEFT BREAST SKIN;  Surgeon: Fanny Skates, MD;  Location: [facility];  Service: General;  Laterality: Left;  . RADIOACTIVE SEED GUIDED AXILLARY SENTINEL LYMPH NODE Left 04/04/2017   Procedure: RADIOACTIVE SEED TARGETED LEFT AXILLARY LYMPH NODE EXCISION;  Surgeon: Fanny Skates, MD;  Location: [facility];  Service: General;  Laterality: Left;  . SHOULDER ARTHROSCOPY     right and left  . TRIGGER FINGER RELEASE  03/29/2012   Procedure: RELEASE TRIGGER FINGER/A-1 PULLEY;  Surgeon: Wynonia Sours, MD;  Location: [facility];  Service: Orthopedics;  Laterality: Left;    FAMILY HISTORY Family History  Problem Relation Age of Onset  . Heart disease Mother        No details   The patient's father died from "old age" at age 31. The patient's mother died at age 38 from complications of high blood pressure and diabetes. The patient had one brother, 4 sisters. There is no cancer in the family to her knowledge   GYNECOLOGIC HISTORY:  No LMP recorded. Patient has had a hysterectomy.  menarche age 50, first live birth age 50. The patient is GX P2. She underwent abdominal hysterectomy with bilateral salpingo-oophorectomy in her 60s. She did not undergo hormone replacement  SOCIAL HISTORY:  [name] used to work for [employer]. She lives with her daughter Jennifer Garrett, who is  Medical sales representative for [employer]. The patient's son Jennifer Garrett lives in [place]. The patient has 1 grandchild, no great-grandchildren. She attends a local [place] DIRECTIVES:  not in place. At the 01/22/2016 visit the patient was given the appropriate documents to complete. She tells me she intends to name her daughter [name] as her healthcare part of attorney    HEALTH MAINTENANCE: Social History   Tobacco Use  . Smoking status: Former Smoker    Last attempt to quit: 03/24/1978    Years since quitting: 39.6  . Smokeless tobacco: Never Used  Substance Use Topics  . Alcohol use: No  . Drug use: No     Colonoscopy: Never   PAP: status post hysterectomy   Bone density: 09/06/2017 with a T-score of -0.2   Allergies  Allergen Reactions  . Lyrica [Pregabalin] Swelling    SWELLING REACTION UNSPECIFIED   . Feldene [Piroxicam] Rash  . Lisinopril Other (See Comments)    States that it makes her "sensitive to light"  . Orudis [Ketoprofen] Rash  . Vibramycin [  Doxycycline Calcium] Rash and Hives    Current Outpatient Medications  Medication Sig Dispense Refill  . albuterol (PROVENTIL HFA;VENTOLIN HFA) 108 (90 BASE) MCG/ACT inhaler Inhale 1 puff into the lungs every 6 (six) hours as needed for wheezing or shortness of breath. Reported on 02/04/2016    . aspirin EC 81 MG tablet Take 1 tablet by mouth every morning.    Marland Kitchen atorvastatin (LIPITOR) 40 MG tablet TAKE 1 TABLET (40 MG TOTAL) BY MOUTH DAILY. 90 tablet 3  . calcium carbonate (OS-CAL) 600 MG TABS Take 600 mg by mouth 2 (two) times daily with a meal.    . diclofenac sodium (VOLTAREN) 1 % GEL Apply 1 application topically every other day.    . donepezil (ARICEPT) 5 MG tablet Take 5 mg by mouth at bedtime.    . ferrous sulfate 325 (65 FE) MG tablet Take 1 tablet (325 mg total) by mouth 2 (two) times daily with a meal. 60 tablet 3  . hydrochlorothiazide (MICROZIDE) 12.5 MG capsule Take 12.5 mg by mouth  daily.    Marland Kitchen KLOR-CON M20 20 MEQ tablet Take 20 mEq by mouth daily.    . naproxen sodium (ANAPROX) 220 MG tablet Take 220 mg by mouth 2 (two) times daily as needed (for pain.).    Marland Kitchen olmesartan (BENICAR) 40 MG tablet Take 40 mg by mouth daily.     No current facility-administered medications for this visit.     OBJECTIVE: Older African-American woman in no acute distress   Vitals:   11/01/17 1503  BP: (!) 147/88  Pulse: (!) 58  Resp: 18  Temp: 97.7 F (36.5 C)  SpO2: 99%     Body mass index is 30.35 kg/m.      ECOG FS:0 - Asymptomatic  Sclerae unicteric, EOMs intact Oropharynx clear and moist No cervical or supraclavicular adenopathy Lungs no rales or rhonchi Heart regular rate and rhythm Abd soft, nontender, positive bowel sounds MSK no focal spinal tenderness, no upper extremity lymphedema Neuro: nonfocal, well oriented, appropriate affect Breasts: On the right side the patient has undergone mastectomy and radiation.  There is no evidence of chest wall recurrence.  On the left breast is unremarkable.  Both axillae are benign.  LAB RESULTS:  CMP     Component Value Date/Time   NA 140 10/27/2017 1213   NA 143 05/04/2017 1418   K 4.7 10/27/2017 1213   K 4.2 05/04/2017 1418   CL 107 10/27/2017 1213   CO2 25 10/27/2017 1213   CO2 28 05/04/2017 1418   GLUCOSE 99 10/27/2017 1213   GLUCOSE 125 05/04/2017 1418   BUN 16 10/27/2017 1213   BUN 22.7 05/04/2017 1418   CREATININE 1.13 (H) 10/27/2017 1213   CREATININE 1.1 05/04/2017 1418   CALCIUM 9.5 10/27/2017 1213   CALCIUM 9.3 05/04/2017 1418   PROT 7.2 10/27/2017 1213   PROT 7.2 05/04/2017 1418   ALBUMIN 3.5 10/27/2017 1213   ALBUMIN 3.3 (L) 05/04/2017 1418   AST 17 10/27/2017 1213   AST 20 05/04/2017 1418   ALT 8 10/27/2017 1213   ALT 11 05/04/2017 1418   ALKPHOS 73 10/27/2017 1213   ALKPHOS 67 05/04/2017 1418   BILITOT 0.4 10/27/2017 1213   BILITOT 0.32 05/04/2017 1418   GFRNONAA 44 (L) 10/27/2017 1213    GFRAA 51 (L) 10/27/2017 1213    INo results found for: SPEP, UPEP  Lab Results  Component Value Date   WBC 4.5 11/01/2017   NEUTROABS 2.5 11/01/2017  HGB 11.3 (L) 11/01/2017   HCT 35.9 11/01/2017   MCV 87.8 11/01/2017   PLT 198 11/01/2017      Chemistry      Component Value Date/Time   NA 140 10/27/2017 1213   NA 143 05/04/2017 1418   K 4.7 10/27/2017 1213   K 4.2 05/04/2017 1418   CL 107 10/27/2017 1213   CO2 25 10/27/2017 1213   CO2 28 05/04/2017 1418   BUN 16 10/27/2017 1213   BUN 22.7 05/04/2017 1418   CREATININE 1.13 (H) 10/27/2017 1213   CREATININE 1.1 05/04/2017 1418      Component Value Date/Time   CALCIUM 9.5 10/27/2017 1213   CALCIUM 9.3 05/04/2017 1418   ALKPHOS 73 10/27/2017 1213   ALKPHOS 67 05/04/2017 1418   AST 17 10/27/2017 1213   AST 20 05/04/2017 1418   ALT 8 10/27/2017 1213   ALT 11 05/04/2017 1418   BILITOT 0.4 10/27/2017 1213   BILITOT 0.32 05/04/2017 1418       No results found for: LABCA2  No components found for: LABCA125  No results for input(s): INR in the last 168 hours.  Urinalysis    Component Value Date/Time   COLORURINE YELLOW 05/19/2016 1138   APPEARANCEUR CLEAR 05/19/2016 1138   LABSPEC 1.017 05/19/2016 1138   PHURINE 5.5 05/19/2016 1138   GLUCOSEU NEGATIVE 05/19/2016 1138   HGBUR NEGATIVE 05/19/2016 1138   BILIRUBINUR NEGATIVE 05/19/2016 1138   KETONESUR NEGATIVE 05/19/2016 1138   PROTEINUR NEGATIVE 05/19/2016 1138   UROBILINOGEN 1.0 11/28/2013 0143   NITRITE NEGATIVE 05/19/2016 1138   LEUKOCYTESUR NEGATIVE 05/19/2016 1138     STUDIES: Ct Chest W Contrast  Result Date: 10/27/2017 CLINICAL DATA:  Breast cancer.  Restaging bilateral breast cancer. EXAM: CT CHEST WITH CONTRAST TECHNIQUE: Multidetector CT imaging of the chest was performed during intravenous contrast administration. CONTRAST:  41m OMNIPAQUE IOHEXOL 300 MG/ML  SOLN COMPARISON:  PET-CT 08/24/2016 FINDINGS: Cardiovascular: Coronary artery  calcification and aortic atherosclerotic calcification. Mediastinum/Nodes: No axillary adenopathy bilateral axillary node dissections. No supraclavicular adenopathy. Fat containing lesion in the LEFT lobe of thyroid gland. No mediastinal adenopathy. No internal mammary adenopathy Esophagus normal. Lungs/Pleura: Mild peripheral reticulation in the upper lobes consistent radiation change. Tiny 2 mm nodule in the LEFT lower lobe (image 104/5) Upper Abdomen: Limited view of the liver, kidneys, pancreas are unremarkable. Normal adrenal glands. Musculoskeletal: No aggressive osseous lesion. RIGHT mastectomy anatomy. IMPRESSION: 1. No evidence of breast cancer recurrence. 2. Coronary artery calcification and Aortic Atherosclerosis (ICD10-I70.0). Electronically Signed   By: SSuzy BouchardM.D.   On: 10/27/2017 16:17    ASSESSMENT: 84y.o. [place] woman status post right breast lower outer quadrant and right axillary lymph node biopsy 01/06/2016 both positive for a clinical T4 N1 M1, stage IV invasive ductal carcinoma, grade 3, triple negative, with an MIB-1 of 70%  (a) CT scan of the chest and bone scan 01/27/2016 showed scattered pulmonary nodules in addition to locally advanced disease  (b) repeat CT scan of the chest 04/12/2016 shows persistent pulmonary nodules, progression in local disease  (c) CA-27-29 is noninformative  (1) neoadjuvant chemotherapy consisting of capecitabine, 1500 mg twice daily, 7 days on 7 days off, starting 01/27/2016, discontinued 04/14/2016 with progression  (2) right modified radical mastectomy 05/25/2016 showed a pT3 pN2, stage IIIA invasive ductal carcinoma, grade 3, with negative margins.  (3) adjuvant radiation 08/24/16- 10/07/16 Site/dose:   1) Right chest wall / 50.4 Gy in 28 fractions  2) Right chest wall boost / 10 Gy in 5 fractions  (4) restaging studies and follow-up:  (a) PET scan 08/24/2016 was negative except for a 1.0 cm LEFT axillary  lymph node  (b)  biopsy of the left axillary lymph node 02/22/2017 showed breast carcinoma, again triple negative  (c) CT chest on 10/27/2017 showed: No evidence of breast cancer recurrence.   (5) on 04/04/2017 she underwent removal of the left axillary mass (measuring 4.8 cm,) together with 2 benign lymph nodes.  (a) skin punch biopsy on the same day was benign  (6) adjuvant radiation to the left axillary tumor bed completed 07/01/2017:  Site/dose:   Left axilla / 50 Gy in 25 fy  PLAN: Kirbi is now just about 2 years out from definitive diagnosis of metastatic breast cancer, with no evidence of active disease.  This is very favorable.  I am going to see her again in 6 months and we are going to obtain a noncontrast chest CT before that visit as well as some lab work.  We also reviewed her bone density which is normal.  I have urged her and the friend accompanying her today to call us if any new problems or symptoms develop before that visit.  Magrinat, Virgie Dad, MD  11/01/17 3:22 PM Medical Oncology and Hematology [facility] [address] [place], [place] [address] Tel. [phone]    Fax. [phone]  This document serves as a record of services personally performed by Lurline Del, MD. It was created on his behalf by Sheron Nightingale, a trained medical scribe. The creation of this record is based on the scribe's personal observations and the provider's statements to them.   I have reviewed the above documentation for accuracy and completeness, and I agree with the above.

## 2017-11-01 ENCOUNTER — Telehealth: Payer: Self-pay | Admitting: Oncology

## 2017-11-01 ENCOUNTER — Inpatient Hospital Stay: Payer: Medicare Other

## 2017-11-01 ENCOUNTER — Inpatient Hospital Stay (HOSPITAL_BASED_OUTPATIENT_CLINIC_OR_DEPARTMENT_OTHER): Payer: Medicare Other | Admitting: Oncology

## 2017-11-01 VITALS — BP 147/88 | HR 58 | Temp 97.7°F | Resp 18 | Ht 60.0 in | Wt 155.4 lb

## 2017-11-01 DIAGNOSIS — C7800 Secondary malignant neoplasm of unspecified lung: Secondary | ICD-10-CM

## 2017-11-01 DIAGNOSIS — E119 Type 2 diabetes mellitus without complications: Secondary | ICD-10-CM | POA: Diagnosis not present

## 2017-11-01 DIAGNOSIS — I2510 Atherosclerotic heart disease of native coronary artery without angina pectoris: Secondary | ICD-10-CM | POA: Diagnosis not present

## 2017-11-01 DIAGNOSIS — C7951 Secondary malignant neoplasm of bone: Secondary | ICD-10-CM | POA: Diagnosis not present

## 2017-11-01 DIAGNOSIS — Z87891 Personal history of nicotine dependence: Secondary | ICD-10-CM | POA: Diagnosis not present

## 2017-11-01 DIAGNOSIS — I10 Essential (primary) hypertension: Secondary | ICD-10-CM

## 2017-11-01 DIAGNOSIS — Z171 Estrogen receptor negative status [ER-]: Secondary | ICD-10-CM

## 2017-11-01 DIAGNOSIS — C773 Secondary and unspecified malignant neoplasm of axilla and upper limb lymph nodes: Secondary | ICD-10-CM | POA: Diagnosis not present

## 2017-11-01 DIAGNOSIS — C50311 Malignant neoplasm of lower-inner quadrant of right female breast: Secondary | ICD-10-CM

## 2017-11-01 LAB — COMPREHENSIVE METABOLIC PANEL
ALK PHOS: 66 U/L (ref 38–126)
ALT: 12 U/L — ABNORMAL LOW (ref 14–54)
ANION GAP: 8 (ref 5–15)
AST: 18 U/L (ref 15–41)
Albumin: 3.7 g/dL (ref 3.5–5.0)
BILIRUBIN TOTAL: 0.4 mg/dL (ref 0.3–1.2)
BUN: 22 mg/dL — ABNORMAL HIGH (ref 6–20)
CALCIUM: 9.7 mg/dL (ref 8.9–10.3)
CO2: 25 mmol/L (ref 22–32)
Chloride: 109 mmol/L (ref 101–111)
Creatinine, Ser: 1.29 mg/dL — ABNORMAL HIGH (ref 0.44–1.00)
GFR, EST AFRICAN AMERICAN: 43 mL/min — AB (ref >60)
GFR, EST NON AFRICAN AMERICAN: 37 mL/min — AB (ref >60)
GLUCOSE: 143 mg/dL — AB (ref 65–99)
POTASSIUM: 5 mmol/L (ref 3.5–5.1)
Sodium: 142 mmol/L (ref 135–145)
TOTAL PROTEIN: 7.7 g/dL (ref 6.5–8.1)

## 2017-11-01 LAB — CBC WITH DIFFERENTIAL/PLATELET
BASOS PCT: 0 %
Basophils Absolute: 0 10*3/uL (ref 0.0–0.1)
Eosinophils Absolute: 0.1 10*3/uL (ref 0.0–0.5)
Eosinophils Relative: 2 %
HEMATOCRIT: 35.9 % (ref 34.8–46.6)
Hemoglobin: 11.3 g/dL — ABNORMAL LOW (ref 11.6–15.9)
LYMPHS PCT: 29 %
Lymphs Abs: 1.3 10*3/uL (ref 0.9–3.3)
MCH: 27.6 pg (ref 25.1–34.0)
MCHC: 31.5 g/dL (ref 31.5–36.0)
MCV: 87.8 fL (ref 79.5–101.0)
MONO ABS: 0.6 10*3/uL (ref 0.1–0.9)
Monocytes Relative: 12 %
NEUTROS ABS: 2.5 10*3/uL (ref 1.5–6.5)
NEUTROS PCT: 57 %
Platelets: 198 10*3/uL (ref 145–400)
RBC: 4.09 MIL/uL (ref 3.70–5.45)
RDW: 12.9 % (ref 11.2–14.5)
WBC: 4.5 10*3/uL (ref 3.9–10.3)

## 2017-11-01 NOTE — Telephone Encounter (Signed)
Gave avs and calendar patient could not do [place]

## 2017-11-30 ENCOUNTER — Telehealth: Payer: Self-pay | Admitting: *Deleted

## 2017-11-30 NOTE — Telephone Encounter (Addendum)
This RN received call at 415 pm from pt's daughter Jennifer Garrett stating " my mom had 25 lymph nodes removed from her right arm and now it is swollen "  Swelling started approximately 3 days ago.  This RN discussed need to come in so arm can be assessed for evaluation of possible cellulitis and need for antibiotics per necessity to proceed with referral to the lymphedema clinic.  Of note pt had mastectomy with lymph node dissection ( surgeon dictation states 21 lymph nodes dissected ) in November 2017.  Return call number given for Jennifer Garrett is [phone].  This RN verified availability with Symptom Management provider.  Call returned to [facility] at above number- obtained VM - message left requesting appointment for 10 am on 5/16.  [facility] inbox message sent to scheduling per above.

## 2017-12-01 ENCOUNTER — Inpatient Hospital Stay: Payer: Medicare Other | Attending: Oncology | Admitting: Medical

## 2017-12-01 VITALS — BP 179/68 | HR 56 | Temp 98.0°F | Resp 17 | Ht 60.0 in | Wt 160.7 lb

## 2017-12-01 DIAGNOSIS — C773 Secondary and unspecified malignant neoplasm of axilla and upper limb lymph nodes: Secondary | ICD-10-CM | POA: Diagnosis not present

## 2017-12-01 DIAGNOSIS — Z87891 Personal history of nicotine dependence: Secondary | ICD-10-CM

## 2017-12-01 DIAGNOSIS — I890 Lymphedema, not elsewhere classified: Secondary | ICD-10-CM | POA: Diagnosis present

## 2017-12-01 DIAGNOSIS — C7800 Secondary malignant neoplasm of unspecified lung: Secondary | ICD-10-CM | POA: Insufficient documentation

## 2017-12-01 DIAGNOSIS — C7951 Secondary malignant neoplasm of bone: Secondary | ICD-10-CM | POA: Diagnosis not present

## 2017-12-01 DIAGNOSIS — C50311 Malignant neoplasm of lower-inner quadrant of right female breast: Secondary | ICD-10-CM | POA: Diagnosis not present

## 2017-12-01 DIAGNOSIS — L03113 Cellulitis of right upper limb: Secondary | ICD-10-CM | POA: Insufficient documentation

## 2017-12-01 MED ORDER — SULFAMETHOXAZOLE-TRIMETHOPRIM 800-160 MG PO TABS: 1.0000 | ORAL_TABLET | Freq: Two times a day (BID) | ORAL | 0 refills | Status: DC

## 2017-12-01 NOTE — Patient Instructions (Signed)

## 2017-12-01 NOTE — Progress Notes (Signed)
Pt presents with R arm swelling after lymph node removal in 2017.  Denies pain or redness or irritation around skin.  Denies loss of ROM or sensation.  Nonpitting edema entire R arm.

## 2017-12-01 NOTE — Progress Notes (Signed)
[facility] Progress Note   Jennifer Garrett [id number] 02-03-1932 84 y.o.  Scherry Ran is managed by Dr. Jana Hakim  Actively treated with chemotherapy: no  Assessment: Plan:    Lymphedema of right arm - Plan: Ambulatory referral to Physical Therapy  Cellulitis of right upper extremity - Plan: sulfamethoxazole-trimethoprim (BACTRIM DS,SEPTRA DS) 800-160 MG tablet   Lymphedema of the right upper extremity: Referral is been made to the lymphedema clinic.  Cellulitis of the right upper extremity: Patient was given a prescription for Bactrim DS, 1 p.o. twice daily x7 days.  Please see After Visit Summary for patient specific instructions.  Future Appointments  Date Time Provider [place]  12/21/2017  8:45 AM Gardiner Barefoot, DPM [facility]  05/16/2018 11:30 AM [facility] None  05/16/2018 12:00 PM Magrinat, Virgie Dad, MD [facility] None    Orders Placed This Encounter  Procedures  . Ambulatory referral to Physical Therapy       Subjective:   Patient ID:  Jennifer Garrett is a 84 y.o. (DOB 1931-10-25) female.  Chief Complaint:  Chief Complaint  Patient presents with  . Edema    HPI Jennifer Garrett is an 84 year old with a locally advanced triple negative breast cancer who is managed with observation by Dr. Jana Hakim.  Her office was contacted yesterday by the patient's daughter stating that the patient's right upper extremity was swollen and that there was concern that the patient could have cellulitis.  She presents to the office today for evaluation.  According to the patient, she has been raking her yard and sweeping her sidewalk.  She denies any injuries.  She denies fevers, chills, or sweats.  Medications: I have reviewed the patient's current medications.  Allergies:  Allergies  Allergen Reactions  . Lyrica [Pregabalin] Swelling    SWELLING REACTION UNSPECIFIED   . Feldene [Piroxicam] Rash  . Lisinopril Other (See  Comments)    States that it makes her "sensitive to light"  . Orudis [Ketoprofen] Rash  . Vibramycin [Doxycycline Calcium] Rash and Hives    Past Medical History:  Diagnosis Date  . Arthritis   . Asthma    rarely uses neb  . Breast cancer ([place]) 05/25/2016   right breast  . Breast cancer of lower-inner quadrant of right female breast ([place]) 01/22/2016  . Carpal tunnel syndrome   . Coronary artery disease    DES to OM3 90% stenosis 2003, 90% small RCA stenosis.    . Diabetes mellitus    not taken glipizide in over a year, checks surgars daily  . History of stomach ulcers   . Hyperlipemia   . Hypertension   . Irregular heart beat     Past Surgical History:  Procedure Laterality Date  . ABDOMINAL HYSTERECTOMY    . CARDIAC CATHETERIZATION  2003   stent-  . CARPAL TUNNEL RELEASE  03/29/2012   Procedure: CARPAL TUNNEL RELEASE;  Surgeon: Wynonia Sours, MD;  Location: [place];  Service: Orthopedics;  Laterality: Left;  . EYE SURGERY     catracts  . LESION EXCISION WITH COMPLEX REPAIR Right 05/25/2016   Procedure: complex repair of 25cm wound;  Surgeon: Irene Limbo, MD;  Location: [place];  Service: Plastics;  Laterality: Right;  . MASTECTOMY     right  . MASTECTOMY MODIFIED RADICAL Right 05/25/2016   Procedure: RIGHT MASTECTOMY MODIFIED RADICAL;  Surgeon: Fanny Skates, MD;  Location: [place];  Service: General;  Laterality: Right;  . PUNCH  BIOPSY OF SKIN Left 04/04/2017   Procedure: PUNCH BIOPSY OF LEFT BREAST SKIN;  Surgeon: Fanny Skates, MD;  Location: [place];  Service: General;  Laterality: Left;  . RADIOACTIVE SEED GUIDED AXILLARY SENTINEL LYMPH NODE Left 04/04/2017   Procedure: RADIOACTIVE SEED TARGETED LEFT AXILLARY LYMPH NODE EXCISION;  Surgeon: Fanny Skates, MD;  Location: [place];  Service: General;  Laterality: Left;  . SHOULDER ARTHROSCOPY     right and left  . TRIGGER FINGER RELEASE  03/29/2012   Procedure:  RELEASE TRIGGER FINGER/A-1 PULLEY;  Surgeon: Wynonia Sours, MD;  Location: [place];  Service: Orthopedics;  Laterality: Left;    Family History  Problem Relation Age of Onset  . Heart disease Mother        No details     Social History   Socioeconomic History  . Marital status: Single    Spouse name: Not on file  . Number of children: 2  . Years of education: Not on file  . Highest education level: Not on file  Occupational History  . Not on file  Social Needs  . Financial resource strain: Not on file  . Food insecurity:    Worry: Not on file    Inability: Not on file  . Transportation needs:    Medical: Not on file    Non-medical: Not on file  Tobacco Use  . Smoking status: Former Smoker    Last attempt to quit: 03/24/1978    Years since quitting: 39.7  . Smokeless tobacco: Never Used  Substance and Sexual Activity  . Alcohol use: No  . Drug use: No  . Sexual activity: Not on file  Lifestyle  . Physical activity:    Days per week: Not on file    Minutes per session: Not on file  . Stress: Not on file  Relationships  . Social connections:    Talks on phone: Not on file    Gets together: Not on file    Attends religious service: Not on file    Active member of club or organization: Not on file    Attends meetings of clubs or organizations: Not on file    Relationship status: Not on file  . Intimate partner violence:    Fear of current or ex partner: Not on file    Emotionally abused: Not on file    Physically abused: Not on file    Forced sexual activity: Not on file  Other Topics Concern  . Not on file  Social History Narrative   Lives with daughter.  One grandson.     Past Medical History, Surgical history, Social history, and Family history were reviewed and updated as appropriate.   Please see review of systems for further details on the patient's review from today.   Review of Systems:  Review of Systems  Constitutional: Negative  for chills, diaphoresis and fever.  Respiratory: Negative for cough, chest tightness, shortness of breath and wheezing.   Cardiovascular: Positive for leg swelling (Episodic swelling of the ankles.). Negative for chest pain and palpitations.  Skin: Negative for color change and rash.       Swelling of the right upper extremity with mild redness of the medial forearm.    Objective:   Physical Exam:  BP (!) 179/68 (BP Location: Left Arm, Patient Position: Sitting) Comment: told rn  Pulse (!) 56   Temp 98 F (36.7 C) (Oral)   Resp 17   Ht  5' (1.524 m)   Wt 160 lb 11.2 oz (72.9 kg)   SpO2 99%   BMI 31.38 kg/m  ECOG: 0  Physical Exam  Constitutional: No distress.  HENT:  Head: Normocephalic and atraumatic.  Cardiovascular: Normal rate, regular rhythm and normal heart sounds. Exam reveals no gallop and no friction rub.  No murmur heard. Pulmonary/Chest: Effort normal and breath sounds normal. No respiratory distress. She has no wheezes. She has no rales.  Musculoskeletal: She exhibits edema (Trace bilateral ankle edema.).  Neurological: She is alert.  Skin: Skin is warm and dry. No rash noted. She is not diaphoretic. There is erythema.  Marked lymphedema of the right upper extremity with diffuse erythema over the right medial forearm with a mild increase in warmth.    Lab Review:     Component Value Date/Time   NA 142 11/01/2017 1421   NA 143 05/04/2017 1418   K 5.0 11/01/2017 1421   K 4.2 05/04/2017 1418   CL 109 11/01/2017 1421   CO2 25 11/01/2017 1421   CO2 28 05/04/2017 1418   GLUCOSE 143 (H) 11/01/2017 1421   GLUCOSE 125 05/04/2017 1418   BUN 22 (H) 11/01/2017 1421   BUN 22.7 05/04/2017 1418   CREATININE 1.29 (H) 11/01/2017 1421   CREATININE 1.13 (H) 10/27/2017 1213   CREATININE 1.1 05/04/2017 1418   CALCIUM 9.7 11/01/2017 1421   CALCIUM 9.3 05/04/2017 1418   PROT 7.7 11/01/2017 1421   PROT 7.2 05/04/2017 1418   ALBUMIN 3.7 11/01/2017 1421   ALBUMIN 3.3 (L)  05/04/2017 1418   AST 18 11/01/2017 1421   AST 17 10/27/2017 1213   AST 20 05/04/2017 1418   ALT 12 (L) 11/01/2017 1421   ALT 8 10/27/2017 1213   ALT 11 05/04/2017 1418   ALKPHOS 66 11/01/2017 1421   ALKPHOS 67 05/04/2017 1418   BILITOT 0.4 11/01/2017 1421   BILITOT 0.4 10/27/2017 1213   BILITOT 0.32 05/04/2017 1418   GFRNONAA 37 (L) 11/01/2017 1421   GFRNONAA 44 (L) 10/27/2017 1213   GFRAA 43 (L) 11/01/2017 1421   GFRAA 51 (L) 10/27/2017 1213       Component Value Date/Time   WBC 4.5 11/01/2017 1421   RBC 4.09 11/01/2017 1421   HGB 11.3 (L) 11/01/2017 1421   HGB 11.4 (L) 10/27/2017 1213   HGB 10.2 (L) 05/04/2017 1418   HCT 35.9 11/01/2017 1421   HCT 31.9 (L) 05/04/2017 1418   PLT 198 11/01/2017 1421   PLT 179 10/27/2017 1213   PLT 212 05/04/2017 1418   MCV 87.8 11/01/2017 1421   MCV 87.6 05/04/2017 1418   MCH 27.6 11/01/2017 1421   MCHC 31.5 11/01/2017 1421   RDW 12.9 11/01/2017 1421   RDW 13.9 05/04/2017 1418   LYMPHSABS 1.3 11/01/2017 1421   LYMPHSABS 1.8 05/04/2017 1418   MONOABS 0.6 11/01/2017 1421   MONOABS 0.7 05/04/2017 1418   EOSABS 0.1 11/01/2017 1421   EOSABS 0.2 05/04/2017 1418   BASOSABS 0.0 11/01/2017 1421   BASOSABS 0.1 05/04/2017 1418   -------------------------------  Imaging from last 24 hours (if applicable):  Radiology interpretation: No results found.

## 2017-12-06 ENCOUNTER — Encounter: Payer: Self-pay | Admitting: Rehabilitation

## 2017-12-06 ENCOUNTER — Other Ambulatory Visit: Payer: Self-pay

## 2017-12-06 ENCOUNTER — Ambulatory Visit: Payer: Medicare Other | Attending: Medical | Admitting: Rehabilitation

## 2017-12-06 DIAGNOSIS — Z483 Aftercare following surgery for neoplasm: Secondary | ICD-10-CM | POA: Insufficient documentation

## 2017-12-06 DIAGNOSIS — I972 Postmastectomy lymphedema syndrome: Secondary | ICD-10-CM | POA: Diagnosis not present

## 2017-12-06 NOTE — Therapy (Signed)
[facility] [place], [place], [address] Phone: [phone]   Fax:  [phone]  Physical Therapy Evaluation  Patient Details  Name: Jennifer Garrett MRN: [id number] Date of Birth: 1931-10-05 Referring Provider: Sandi Mealy PA-C   Encounter Date: 12/06/2017  PT End of Session - 12/06/17 1215    Visit Number  1    Number of Visits  12    Date for PT Re-Evaluation  01/17/18    Authorization Type  Medicare    PT Start Time  0932    PT Stop Time  1015    PT Time Calculation (min)  43 min    Activity Tolerance  Patient tolerated treatment well    Behavior During Therapy  [facility] for tasks assessed/performed       Past Medical History:  Diagnosis Date  . Arthritis   . Asthma    rarely uses neb  . Breast cancer ([place]) 05/25/2016   right breast  . Breast cancer of lower-inner quadrant of right female breast ([place]) 01/22/2016  . Carpal tunnel syndrome   . Coronary artery disease    DES to OM3 90% stenosis 2003, 90% small RCA stenosis.    . Diabetes mellitus    not taken glipizide in over a year, checks surgars daily  . History of stomach ulcers   . Hyperlipemia   . Hypertension   . Irregular heart beat     Past Surgical History:  Procedure Laterality Date  . ABDOMINAL HYSTERECTOMY    . CARDIAC CATHETERIZATION  2003   stent-  . CARPAL TUNNEL RELEASE  03/29/2012   Procedure: CARPAL TUNNEL RELEASE;  Surgeon: Wynonia Sours, MD;  Location: [place];  Service: Orthopedics;  Laterality: Left;  . EYE SURGERY     catracts  . LESION EXCISION WITH COMPLEX REPAIR Right 05/25/2016   Procedure: complex repair of 25cm wound;  Surgeon: Irene Limbo, MD;  Location: [place];  Service: Plastics;  Laterality: Right;  . MASTECTOMY     right  . MASTECTOMY MODIFIED RADICAL Right 05/25/2016   Procedure: RIGHT MASTECTOMY MODIFIED RADICAL;  Surgeon: Fanny Skates, MD;  Location: [place];  Service: General;  Laterality:  Right;  . PUNCH BIOPSY OF SKIN Left 04/04/2017   Procedure: PUNCH BIOPSY OF LEFT BREAST SKIN;  Surgeon: Fanny Skates, MD;  Location: [place];  Service: General;  Laterality: Left;  . RADIOACTIVE SEED GUIDED AXILLARY SENTINEL LYMPH NODE Left 04/04/2017   Procedure: RADIOACTIVE SEED TARGETED LEFT AXILLARY LYMPH NODE EXCISION;  Surgeon: Fanny Skates, MD;  Location: [place];  Service: General;  Laterality: Left;  . SHOULDER ARTHROSCOPY     right and left  . TRIGGER FINGER RELEASE  03/29/2012   Procedure: RELEASE TRIGGER FINGER/A-1 PULLEY;  Surgeon: Wynonia Sours, MD;  Location: [place];  Service: Orthopedics;  Laterality: Left;    There were no vitals filed for this visit.   Subjective Assessment - 12/06/17 1201    Subjective  Pt presents today with new onset of Rt UE swelling which her daughter noticed about a week ago.  Patient is a poor historian due to strokes but reports no pain, difficulties with daily living, or decreased strength.  She does mention some pain and numbness in the Lt UE from that surgery    Patient is accompained by:  Family member    Pertinent History  2 strokes, poor recall and memory, bilateral triple negative breast cancer  with mets to the lungs treated with Rt modified radical mastectomy and ALND of 21 nodes, Lt lumpectomy with removal of 2 nodes, bilateral radiation and chemotherapy    Currently in Pain?  Yes pain in the Lt shoulder and arm         [facility] PT Assessment - 12/06/17 0001      Assessment   Medical Diagnosis  Rt UE lymphedema after modified radical mastectomy    Referring Provider  Sandi Mealy PA-C    Onset Date/Surgical Date  05/25/16 swelling 1 week ago    Hand Dominance  Right    Prior Therapy  yes here for shoulder ROM      Precautions   Precaution Comments  cancer, lymphedema      Balance Screen   Has the patient fallen in the past 6 months  No    Has the patient had a decrease in  activity level because of a fear of falling?   No    Is the patient reluctant to leave their home because of a fear of falling?   No      Home Environment   Living Environment  Private residence    Living Arrangements  Children    Available Help at Discharge  Available 24 hours/day    Type of Home  House      Prior Function   Level of Independence  Independent with basic ADLs    Vocation  Retired    Leisure  gardening, Oceanographer  Impaired      Posture/Postural Control   Posture/Postural Control  Postural limitations    Postural Limitations  Rounded Shoulders;Forward head;Increased thoracic kyphosis      ROM / Strength   AROM / PROM / Strength  AROM;Strength      AROM   Overall AROM Comments  no pain and no ADL limitations per patient    AROM Assessment Site  Shoulder    Right/Left Shoulder  Right;Left    Right Shoulder Flexion  140 Degrees    Right Shoulder ABduction  125 Degrees    Right Shoulder External Rotation  90 Degrees    Left Shoulder Flexion  140 Degrees    Left Shoulder ABduction  120 Degrees    Left Shoulder External Rotation  80 Degrees      Strength   Overall Strength Comments  seated strength testing strong bilaterally     Strength Assessment Site  Shoulder    Right/Left Shoulder  Right;Left        LYMPHEDEMA/ONCOLOGY QUESTIONNAIRE - 12/06/17 0936      Type   Cancer Type  breast cancer       Surgeries   Mastectomy Date  05/25/16    Lumpectomy Date  04/04/17    Number Lymph Nodes Removed  -- 21 Rt, 2 Lt per patient      Treatment   Active Chemotherapy Treatment  No    Past Chemotherapy Treatment  Yes    Date  01/27/16    Active Radiation Treatment  No    Past Radiation Treatment  Yes    Body Site  Lt and Rt breast    Current Hormone Treatment  No    Past Hormone Therapy  No      What other symptoms do you have   Are you Having Heaviness or Tightness  No    Are you having Pain  No    Is it Hard  or Difficult finding  clothes that fit  No    Other Symptoms  on Bactrim day 3 or 4 for possible cellulitis      Lymphedema Stage   Stage  STAGE 2 SPONTANEOUSLY IRREVERSIBLE      Lymphedema Assessments   Lymphedema Assessments  Upper extremities      Right Upper Extremity Lymphedema   15 cm Proximal to Olecranon Process  36.4 cm    10 cm Proximal to Olecranon Process  38.4 cm    Olecranon Process  30 cm    15 cm Proximal to Ulnar Styloid Process  28 cm    10 cm Proximal to Ulnar Styloid Process  24 cm    Just Proximal to Ulnar Styloid Process  28.7 cm    Across Hand at [employer]  20 cm    At [place] of 2nd Digit  6.5 cm      Left Upper Extremity Lymphedema   15 cm Proximal to Olecranon Process  31 cm    10 cm Proximal to Olecranon Process  30.5 cm    Olecranon Process  27.5 cm    15 cm Proximal to Ulnar Styloid Process  24 cm    10 cm Proximal to Ulnar Styloid Process  19.5 cm    Just Proximal to Ulnar Styloid Process  15.6 cm    Across Hand at [employer]  19 cm    At [place] of 2nd Digit  5.6 cm             Outpatient [facility]06/[facility]  Lymphedema Life Impact Scale Total Score  8.82 %      Objective measurements completed on examination: See above findings.      [facility] Adult PT Treatment/Exercise - 12/06/17 0001      Manual Therapy   Manual Therapy  Edema management    Edema Management  gave pt TG soft for Rt UE medium              PT Education - 12/06/17 1214    Education provided  Yes    Education Details  POC options    Person(s) Educated  Patient;Child(ren)    Methods  Explanation    Comprehension  Verbalized understanding          PT Long Term Goals - 12/06/17 1224      PT LONG TERM GOAL #1   Title  Pt will be knowledgeable about lymphedema risk reduction practices to decrease the occurrence or worsening of lymphedema    Time  4    Period  Weeks    Status  New    Target Date  01/17/18      PT LONG  TERM GOAL #2   Title  pt will obtain appropriate compression garments and be knowledgeable about their use    Time  4    Period  Weeks    Status  New    Target Date  01/17/18      PT LONG TERM GOAL #3   Title  pt will decrease circumferential measurements at the ulnar styloid process from 28.7 to 20cm and 10cm proximal to the olecranon from 38.4 to 30.4    Time  4    Period  Weeks    Status  New    Target Date  01/17/18      PT LONG TERM GOAL #4   Title  pt will be independent with remedial exercises  to improve lymphatic flow    Time  4    Period  Weeks    Status  New             Plan - 12/06/17 1216    Clinical Impression Statement  Pt arrives today with new onset of Rt UE lymphedema post Rt radical mastectomy with 21 nodes removed due to triple negative breast cancer.  Pt never complained about the swelling but her daughter noticed it in the hand about a week ago.  She denies any problems with pain, ADLs, or ROM with that side.  Daughter also reports she does alot of sweeping and gardening with that UE during the day.  Pt will benefit from compression bandaging until reduced enough for a compression garment with MLD.  Due to clinic schedule with the holiday week we will attempt to educate patient on self wrapping tomorrow until she is able to get in 3x per week for CDT.  If the patients daughter can not change her work schedule we will begin when regular appointments become available.  She has a few days left on an antibiotic due to possible cellulitis but today with no redness or warmth to the UE    History and Personal Factors relevant to plan of care:  hx of metastatic triple negative breast cancer with lung mets, now with no active disease status, 2 strokes    Clinical Presentation  Evolving    Clinical Presentation due to:  new onset of lymphedema    Clinical Decision Making  Moderate    Rehab Potential  Good    Clinical Impairments Affecting Rehab Potential  none    PT  Frequency  3x / week    PT Duration  4 weeks    PT Treatment/Interventions  Compression bandaging;Manual lymph drainage;Manual techniques;Patient/family education;DME Instruction;Therapeutic exercise    PT Next Visit Plan  if patient returns tomorrow teach patient and daughter how to do self compression bandaging for the Rt UE, if returns in 2 weeks begin CDT    Recommended Other Services  could benefit from [place] for compression when ready?       Patient will benefit from skilled therapeutic intervention in order to improve the following deficits and impairments:  Decreased skin integrity, Increased edema  Visit Diagnosis: Postmastectomy lymphedema - Plan: PT plan of care cert/re-cert  Aftercare following surgery for neoplasm - Plan: PT plan of care cert/re-cert     Problem List Patient Active Problem List   Diagnosis Date Noted  . 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 Preop cardiovascular exam 03/18/2017  . Essential hypertension 03/18/2017  . Dyslipidemia 03/18/2017  . Coronary artery disease involving native coronary artery of native heart without angina pectoris 03/18/2017  . Bruit 03/18/2017  . Lung metastases ([place]) 06/23/2016  . Malignant neoplasm of lower-inner quadrant of right breast of female, estrogen receptor negative ([place]) 01/22/2016    Shan Levans, PT 12/06/2017, 12:34 PM  [place], [place], [address] Phone: [phone]   Fax:  [phone]  Name: Jennifer Garrett MRN: [id number] Date of Birth: May 28, 1932

## 2017-12-07 ENCOUNTER — Ambulatory Visit: Payer: Medicare Other

## 2017-12-07 DIAGNOSIS — Z483 Aftercare following surgery for neoplasm: Secondary | ICD-10-CM

## 2017-12-07 DIAGNOSIS — I972 Postmastectomy lymphedema syndrome: Secondary | ICD-10-CM

## 2017-12-07 NOTE — Therapy (Signed)
[place], [place], [address] Phone: [phone]   Fax:  [phone]  Physical Therapy Treatment  Patient Details  Name: Jennifer Garrett MRN: [id number] Date of Birth: 07-Nov-1931 Referring Provider: Sandi Mealy PA-C   Encounter Date: 12/07/2017  PT End of Session - 12/07/17 1118    Visit Number  2    Number of Visits  12    Date for PT Re-Evaluation  01/17/18    PT Start Time  0939    PT Stop Time  1023    PT Time Calculation (min)  44 min    Activity Tolerance  Patient tolerated treatment well    Behavior During Therapy  [facility] for tasks assessed/performed       Past Medical History:  Diagnosis Date  . Arthritis   . Asthma    rarely uses neb  . Breast cancer ([place]) 05/25/2016   right breast  . Breast cancer of lower-inner quadrant of right female breast ([place]) 01/22/2016  . Carpal tunnel syndrome   . Coronary artery disease    DES to OM3 90% stenosis 2003, 90% small RCA stenosis.    . Diabetes mellitus    not taken glipizide in over a year, checks surgars daily  . History of stomach ulcers   . Hyperlipemia   . Hypertension   . Irregular heart beat     Past Surgical History:  Procedure Laterality Date  . ABDOMINAL HYSTERECTOMY    . CARDIAC CATHETERIZATION  2003   stent-  . CARPAL TUNNEL RELEASE  03/29/2012   Procedure: CARPAL TUNNEL RELEASE;  Surgeon: Wynonia Sours, MD;  Location: [place];  Service: Orthopedics;  Laterality: Left;  . EYE SURGERY     catracts  . LESION EXCISION WITH COMPLEX REPAIR Right 05/25/2016   Procedure: complex repair of 25cm wound;  Surgeon: Irene Limbo, MD;  Location: [place];  Service: Plastics;  Laterality: Right;  . MASTECTOMY     right  . MASTECTOMY MODIFIED RADICAL Right 05/25/2016   Procedure: RIGHT MASTECTOMY MODIFIED RADICAL;  Surgeon: Fanny Skates, MD;  Location: [place];  Service: General;  Laterality: Right;  . PUNCH BIOPSY OF SKIN Left  04/04/2017   Procedure: PUNCH BIOPSY OF LEFT BREAST SKIN;  Surgeon: Fanny Skates, MD;  Location: [place];  Service: General;  Laterality: Left;  . RADIOACTIVE SEED GUIDED AXILLARY SENTINEL LYMPH NODE Left 04/04/2017   Procedure: RADIOACTIVE SEED TARGETED LEFT AXILLARY LYMPH NODE EXCISION;  Surgeon: Fanny Skates, MD;  Location: [place];  Service: General;  Laterality: Left;  . SHOULDER ARTHROSCOPY     right and left  . TRIGGER FINGER RELEASE  03/29/2012   Procedure: RELEASE TRIGGER FINGER/A-1 PULLEY;  Surgeon: Wynonia Sours, MD;  Location: [place];  Service: Orthopedics;  Laterality: Left;    There were no vitals filed for this visit.  Subjective Assessment - 12/07/17 0941    Subjective  Pt back today from having had evaluation yesterday. Nothing new since then.    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  No/denies            LYMPHEDEMA/ONCOLOGY QUESTIONNAIRE - 12/06/17 0936      Type  Cancer Type  breast cancer       Surgeries   Mastectomy Date  05/25/16    Lumpectomy Date  04/04/17    Number Lymph Nodes Removed  -- 21 Rt, 2 Lt per patient      Treatment   Active Chemotherapy Treatment  No    Past Chemotherapy Treatment  Yes    Date  01/27/16    Active Radiation Treatment  No    Past Radiation Treatment  Yes    Body Site  Lt and Rt breast    Current Hormone Treatment  No    Past Hormone Therapy  No      What other symptoms do you have   Are you Having Heaviness or Tightness  No    Are you having Pain  No    Is it Hard or Difficult finding clothes that fit  No    Other Symptoms  on Bactrim day 3 or 4 for possible cellulitis      Lymphedema Stage   Stage  STAGE 2  SPONTANEOUSLY IRREVERSIBLE      Lymphedema Assessments   Lymphedema Assessments  Upper extremities      Right Upper Extremity Lymphedema   15 cm Proximal to Olecranon Process  36.4 cm    10 cm Proximal to Olecranon Process  38.4 cm    Olecranon Process  30 cm    15 cm Proximal to Ulnar Styloid Process  28 cm    10 cm Proximal to Ulnar Styloid Process  24 cm    Just Proximal to Ulnar Styloid Process  28.7 cm    Across Hand at [employer]  20 cm    At [place] of 2nd Digit  6.5 cm      Left Upper Extremity Lymphedema   15 cm Proximal to Olecranon Process  31 cm    10 cm Proximal to Olecranon Process  30.5 cm    Olecranon Process  27.5 cm    15 cm Proximal to Ulnar Styloid Process  24 cm    10 cm Proximal to Ulnar Styloid Process  19.5 cm    Just Proximal to Ulnar Styloid Process  15.6 cm    Across Hand at [employer]  19 cm    At [place] of 2nd Digit  5.6 cm           Outpatient Rehab from [facility]  Lymphedema Life Impact Scale Total Score  8.82 %           [facility] Adult PT Treatment/Exercise - 12/07/17 0001      Manual Therapy   Manual Therapy  Compression Bandaging    Compression Bandaging  Therapist performed then removed and had pts daughter return demonstration: Lotion, thin stockinette, Elastomull to all fingers, Artiflex x1, and 1-6, 1-10 and 1-12 cm short stretch compression bandages from hand to axilla.              PT Education - 12/07/17 1118    Education provided  Yes    Education Details  Compression bandaging    Person(s) Educated  Patient;Child(ren)    Methods  Explanation;Demonstration;Handout    Comprehension  Verbalized understanding;Returned demonstration;Verbal cues required;Tactile cues required;Need further instruction          PT Long Term Goals - 12/06/17 1224      PT LONG TERM GOAL #1   Title  Pt will be knowledgeable about lymphedema risk reduction practices to decrease  the  occurrence or worsening of lymphedema    Time  4    Period  Weeks    Status  New    Target Date  01/17/18      PT LONG TERM GOAL #2   Title  pt will obtain appropriate compression garments and be knowledgeable about their use    Time  4    Period  Weeks    Status  New    Target Date  01/17/18      PT LONG TERM GOAL #3   Title  pt will decrease circumferential measurements at the ulnar styloid process from 28.7 to 20cm and 10cm proximal to the olecranon from 38.4 to 30.4    Time  4    Period  Weeks    Status  New    Target Date  01/17/18      PT LONG TERM GOAL #4   Title  pt will be independent with remedial exercises to improve lymphatic flow    Time  4    Period  Weeks    Status  New            Plan - 12/07/17 1123    Clinical Impression Statement  Pt and daughter back to learn compression bandaging since clinic did not have 2-3x/wk appts available for 2 weeks. Spent entirety of session showing and having daughter return demonstration. Daughter required tactile and VCs mostly to increase tension but overall demonstrated good technique with even layering of all bandages. Handout issued for this and answered daughters questions and also instructed pt to give her daughter feedback if bandage is too tight or loose.     Rehab Potential  Good    Clinical Impairments Affecting Rehab Potential  none    PT Frequency  3x / week    PT Duration  4 weeks    PT Treatment/Interventions  Compression bandaging;Manual lymph drainage;Manual techniques;Patient/family education;DME Instruction;Therapeutic exercise    PT Next Visit Plan  Assess daughters technique with compression bandaging for the Rt UE, then when returns begin CDT    PT Home Exercise Plan  compression bandaging    Consulted and Agree with Plan of Care  Patient       Patient will benefit from skilled therapeutic intervention in order to improve the following deficits and impairments:  Decreased skin integrity, Increased  edema  Visit Diagnosis: Postmastectomy lymphedema  Aftercare following surgery for neoplasm     Problem List Patient Active Problem List   Diagnosis Date Noted  . 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 Preop cardiovascular exam 03/18/2017  . Essential hypertension 03/18/2017  . Dyslipidemia 03/18/2017  . Coronary artery disease involving native coronary artery of native heart without angina pectoris 03/18/2017  . Bruit 03/18/2017  . Lung metastases ([place]) 06/23/2016  . Malignant neoplasm of lower-inner quadrant of right breast of female, estrogen receptor negative ([place]) 01/22/2016    Otelia Limes, PTA 12/07/2017, 12:10 PM  [place], [place], [address] Phone: [phone]   Fax:  [phone]  Name: CAMRIE STOCK MRN: [id number] Date of Birth: 1932-02-26

## 2017-12-13 ENCOUNTER — Ambulatory Visit: Payer: Medicare Other

## 2017-12-13 DIAGNOSIS — I972 Postmastectomy lymphedema syndrome: Secondary | ICD-10-CM | POA: Diagnosis not present

## 2017-12-13 DIAGNOSIS — Z483 Aftercare following surgery for neoplasm: Secondary | ICD-10-CM

## 2017-12-13 NOTE — Therapy (Signed)
[place], [place], [address] Phone: [phone]   Fax:  [phone]  Physical Therapy Treatment  Patient Details  Name: Jennifer Garrett MRN: [id number] Date of Birth: May 03, 1932 Referring Provider: Sandi Mealy PA-C   Encounter Date: 12/13/2017  PT End of Session - 12/13/17 0849    Visit Number  3    Number of Visits  12    Date for PT Re-Evaluation  01/17/18    PT Start Time  0805    PT Stop Time  0850    PT Time Calculation (min)  45 min    Activity Tolerance  Patient tolerated treatment well    Behavior During Therapy  [facility] for tasks assessed/performed       Past Medical History:  Diagnosis Date  . Arthritis   . Asthma    rarely uses neb  . Breast cancer ([place]) 05/25/2016   right breast  . Breast cancer of lower-inner quadrant of right female breast ([place]) 01/22/2016  . Carpal tunnel syndrome   . Coronary artery disease    DES to OM3 90% stenosis 2003, 90% small RCA stenosis.    . Diabetes mellitus    not taken glipizide in over a year, checks surgars daily  . History of stomach ulcers   . Hyperlipemia   . Hypertension   . Irregular heart beat     Past Surgical History:  Procedure Laterality Date  . ABDOMINAL HYSTERECTOMY    . CARDIAC CATHETERIZATION  2003   stent-  . CARPAL TUNNEL RELEASE  03/29/2012   Procedure: CARPAL TUNNEL RELEASE;  Surgeon: Wynonia Sours, MD;  Location: [place];  Service: Orthopedics;  Laterality: Left;  . EYE SURGERY     catracts  . LESION EXCISION WITH COMPLEX REPAIR Right 05/25/2016   Procedure: complex repair of 25cm wound;  Surgeon: Irene Limbo, MD;  Location: [place];  Service: Plastics;  Laterality: Right;  . MASTECTOMY     right  . MASTECTOMY MODIFIED RADICAL Right 05/25/2016   Procedure: RIGHT MASTECTOMY MODIFIED RADICAL;  Surgeon: Fanny Skates, MD;  Location: [place];  Service: General;  Laterality: Right;  . PUNCH BIOPSY OF SKIN Left  04/04/2017   Procedure: PUNCH BIOPSY OF LEFT BREAST SKIN;  Surgeon: Fanny Skates, MD;  Location: [place];  Service: General;  Laterality: Left;  . RADIOACTIVE SEED GUIDED AXILLARY SENTINEL LYMPH NODE Left 04/04/2017   Procedure: RADIOACTIVE SEED TARGETED LEFT AXILLARY LYMPH NODE EXCISION;  Surgeon: Fanny Skates, MD;  Location: [place];  Service: General;  Laterality: Left;  . SHOULDER ARTHROSCOPY     right and left  . TRIGGER FINGER RELEASE  03/29/2012   Procedure: RELEASE TRIGGER FINGER/A-1 PULLEY;  Surgeon: Wynonia Sours, MD;  Location: [place];  Service: Orthopedics;  Laterality: Left;    There were no vitals filed for this visit.  Subjective Assessment - 12/13/17 0808    Subjective  I left the bandages on for a day but then they started falling off so I took them off. Daughter did not try to reapply them reporting did not want to attempt. Worried she would do it wrong. We forgot to bring the bandages back.     Pertinent History  2 strokes, poor recall and memory, bilateral triple negative breast cancer with mets to the lungs treated with Rt modified radical mastectomy and ALND of 21 nodes, Lt lumpectomy with removal of 2 nodes, bilateral radiation  and chemotherapy    Currently in Pain?  No/denies                  Outpatient Rehab from [facility]/06/2017 [facility]  Lymphedema Life Impact Scale Total Score  8.82 %           [facility] Adult PT Treatment/Exercise - 12/13/17 0001      Manual Therapy   Manual Therapy  Manual Lymphatic Drainage (MLD)    Manual Lymphatic Drainage (MLD)  In supine: Short neck, 5 diaphragmatic breaths, Rt inguinal nodes, Rt axillo-inguinal anastomosis, and then Rt UE from lateral upper arm to dorsal hond working from proximal to distal then retracing all steps instructing daughter and having her return demonstration throughout.              PT Education -  12/13/17 0847    Education provided  Yes    Education Details  Manual lymph drainage    Person(s) Educated  Patient    Methods  Explanation;Demonstration;Handout    Comprehension  Verbalized understanding;Returned demonstration          PT Long Term Goals - 12/06/17 1224      PT LONG TERM GOAL #1   Title  Pt will be knowledgeable about lymphedema risk reduction practices to decrease the occurrence or worsening of lymphedema    Time  4    Period  Weeks    Status  New    Target Date  01/17/18      PT LONG TERM GOAL #2   Title  pt will obtain appropriate compression garments and be knowledgeable about their use    Time  4    Period  Weeks    Status  New    Target Date  01/17/18      PT LONG TERM GOAL #3   Title  pt will decrease circumferential measurements at the ulnar styloid process from 28.7 to 20cm and 10cm proximal to the olecranon from 38.4 to 30.4    Time  4    Period  Weeks    Status  New    Target Date  01/17/18      PT LONG TERM GOAL #4   Title  pt will be independent with remedial exercises to improve lymphatic flow    Time  4    Period  Weeks    Status  New            Plan - 12/13/17 0850    Clinical Impression Statement  Daughter had not tried bandaging since here last reporting worried she was going to do it wrong or too tight. Encouraged her to try this at home and to even try practicing on herself to assess tightness of bandage and she reported liking this idea. Instructed her (and pt) in [place] lymph drainage today which daughter seemed more comfortable with. Also had her return some techniques and she did this very well. Daughter plans on trying MLD and bandaging before pts next visit next week.     Rehab Potential  Good    Clinical Impairments Affecting Rehab Potential  none    PT Frequency  3x / week    PT Duration  4 weeks    PT Treatment/Interventions  Compression bandaging;Manual lymph drainage;Manual techniques;Patient/family education;DME  Instruction;Therapeutic exercise    PT Next Visit Plan  Assess daughters technique with compression bandaging and MLD for the Rt UE, then when returns begin CDT    Consulted and  Agree with Plan of Care  Patient       Patient will benefit from skilled therapeutic intervention in order to improve the following deficits and impairments:  Decreased skin integrity, Increased edema  Visit Diagnosis: Postmastectomy lymphedema  Aftercare following surgery for neoplasm     Problem List Patient Active Problem List   Diagnosis Date Noted  . 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 Preop cardiovascular exam 03/18/2017  . Essential hypertension 03/18/2017  . Dyslipidemia 03/18/2017  . Coronary artery disease involving native coronary artery of native heart without angina pectoris 03/18/2017  . Bruit 03/18/2017  . Lung metastases ([place]) 06/23/2016  . Malignant neoplasm of lower-inner quadrant of right breast of female, estrogen receptor negative ([place]) 01/22/2016    Otelia Limes, PTA 12/13/2017, 9:25 AM  [place], [place], [address] Phone: [phone]   Fax:  [phone]  Name: Jennifer Garrett MRN: [id number] Date of Birth: [birth year]

## 2017-12-13 NOTE — Patient Instructions (Signed)
Start with circles near neck above collarbones 10 times.   Cancer Rehab [id number] Deep Effective Breath   Standing, sitting, or laying down, place both hands on the belly. Take a deep breath IN, expanding the belly; then breath OUT, contracting the belly. Repeat __5__ times. Do __2-3__ sessions per day and before your self massage.  Axilla to Axilla - Sweep   On uninvolved side make 5 circles in the armpit, then pump _5__ times from involved armpit across chest to uninvolved armpit, making a pathway. Do _1__ time per day.  Copyright  VHI. All rights reserved.  Axilla to Inguinal Nodes - Sweep   On involved side, make 5 circles at groin at panty line, then pump _5__ times from armpit along side of trunk to outer hip, making your other pathway. Do __1_ time per day.  Copyright  VHI. All rights reserved.  Arm Posterior: Elbow to Shoulder - Sweep   Pump _5__ times from back of elbow to top of shoulder. Then inner to outer upper arm _5_ times, then outer arm again _5_ times. Then back to the pathways _2-3_ times. Do _1__ time per day.  Copyright  VHI. All rights reserved.  ARM: [place] Wrist to Elbow - Sweep   Pump or stationary circles _5__ times from wrist to elbow making sure to do both sides of the forearm. Then retrace your steps to the outer arm, and the pathways _2-3_ times each. Do _1__ time per day.  Copyright  VHI. All rights reserved.  ARM: Dorsum of Hand to Shoulder - Sweep   Pump or stationary circles _5__ times on back of hand including knuckle spaces and individual fingers if needed working up towards the wrist, then retrace all your steps working back up the forearm, doing both sides; upper outer arm and back to your pathways _2-3_ times each. Then do 5 circles again at uninvolved armpit and involved groin where you started! Good job!! Do __1_ time per day.  Copyright  VHI. All rights reserved.     

## 2017-12-20 ENCOUNTER — Encounter: Payer: Self-pay | Admitting: Rehabilitation

## 2017-12-20 ENCOUNTER — Ambulatory Visit: Payer: Medicare Other | Attending: Medical | Admitting: Rehabilitation

## 2017-12-20 DIAGNOSIS — M6281 Muscle weakness (generalized): Secondary | ICD-10-CM | POA: Insufficient documentation

## 2017-12-20 DIAGNOSIS — I972 Postmastectomy lymphedema syndrome: Secondary | ICD-10-CM | POA: Diagnosis not present

## 2017-12-20 DIAGNOSIS — R293 Abnormal posture: Secondary | ICD-10-CM

## 2017-12-20 DIAGNOSIS — Z483 Aftercare following surgery for neoplasm: Secondary | ICD-10-CM

## 2017-12-20 NOTE — Therapy (Signed)
[place], [place], [address] Phone: [phone]   Fax:  [phone]  Physical Therapy Treatment  Patient Details  Name: Jennifer Garrett MRN: [id number] Date of Birth: [birth year] Referring Provider: Sandi Mealy PA-C   Encounter Date: 12/20/2017  PT End of Session - 12/20/17 0948    Visit Number  4    Number of Visits  12    Date for PT Re-Evaluation  01/17/18    PT Start Time  0825    PT Stop Time  0914    PT Time Calculation (min)  49 min    Activity Tolerance  Patient tolerated treatment well    Behavior During Therapy  [facility] for tasks assessed/performed       Past Medical History:  Diagnosis Date  . Arthritis   . Asthma    rarely uses neb  . Breast cancer ([place]) 05/25/2016   right breast  . Breast cancer of lower-inner quadrant of right female breast ([place]) 01/22/2016  . Carpal tunnel syndrome   . Coronary artery disease    DES to OM3 90% stenosis 2003, 90% small RCA stenosis.    . Diabetes mellitus    not taken glipizide in over a year, checks surgars daily  . History of stomach ulcers   . Hyperlipemia   . Hypertension   . Irregular heart beat     Past Surgical History:  Procedure Laterality Date  . ABDOMINAL HYSTERECTOMY    . CARDIAC CATHETERIZATION  2003   stent-  . CARPAL TUNNEL RELEASE  03/29/2012   Procedure: CARPAL TUNNEL RELEASE;  Surgeon: Wynonia Sours, MD;  Location: [place];  Service: Orthopedics;  Laterality: Left;  . EYE SURGERY     catracts  . LESION EXCISION WITH COMPLEX REPAIR Right 05/25/2016   Procedure: complex repair of 25cm wound;  Surgeon: Irene Limbo, MD;  Location: [place];  Service: Plastics;  Laterality: Right;  . MASTECTOMY     right  . MASTECTOMY MODIFIED RADICAL Right 05/25/2016   Procedure: RIGHT MASTECTOMY MODIFIED RADICAL;  Surgeon: Fanny Skates, MD;  Location: [place];  Service: General;  Laterality: Right;  . PUNCH BIOPSY OF SKIN Left  04/04/2017   Procedure: PUNCH BIOPSY OF LEFT BREAST SKIN;  Surgeon: Fanny Skates, MD;  Location: [place];  Service: General;  Laterality: Left;  . RADIOACTIVE SEED GUIDED AXILLARY SENTINEL LYMPH NODE Left 04/04/2017   Procedure: RADIOACTIVE SEED TARGETED LEFT AXILLARY LYMPH NODE EXCISION;  Surgeon: Fanny Skates, MD;  Location: [place];  Service: General;  Laterality: Left;  . SHOULDER ARTHROSCOPY     right and left  . TRIGGER FINGER RELEASE  03/29/2012   Procedure: RELEASE TRIGGER FINGER/A-1 PULLEY;  Surgeon: Wynonia Sours, MD;  Location: [place];  Service: Orthopedics;  Laterality: Left;    There were no vitals filed for this visit.  Subjective Assessment - 12/20/17 0823    Subjective  Nothing new to report.  Did not try any lymph drainage or bandaging.      Currently in Pain?  No/denies                  Outpatient Rehab from [facility]06/2017 [facility]  Lymphedema Life Impact Scale Total Score  8.82 %           [facility] Adult PT Treatment/Exercise - 12/20/17 0001      Manual Therapy   Manual Lymphatic Drainage (  MLD)  In supine: Short neck, 5 diaphragmatic breaths, Rt inguinal nodes, Rt axillo-inguinal anastomosis, and then Rt UE from lateral upper arm to dorsal hond working from proximal to distal then retracing all steps    Compression Bandaging  Performed by PTA due to time: Lotion, thin stockinette, Elastomull to all fingers, Artiflex x1, and 1-6, 1-10 and 1-12 cm short stretch compression bandages from hand to axilla.                   PT Long Term Goals - 12/06/17 1224      PT LONG TERM GOAL #1   Title  Pt will be knowledgeable about lymphedema risk reduction practices to decrease the occurrence or worsening of lymphedema    Time  4    Period  Weeks    Status  New    Target Date  01/17/18      PT LONG TERM GOAL #2   Title  pt will obtain appropriate compression  garments and be knowledgeable about their use    Time  4    Period  Weeks    Status  New    Target Date  01/17/18      PT LONG TERM GOAL #3   Title  pt will decrease circumferential measurements at the ulnar styloid process from 28.7 to 20cm and 10cm proximal to the olecranon from 38.4 to 30.4    Time  4    Period  Weeks    Status  New    Target Date  01/17/18      PT LONG TERM GOAL #4   Title  pt will be independent with remedial exercises to improve lymphatic flow    Time  4    Period  Weeks    Status  New            Plan - 12/20/17 1154    Clinical Impression Statement  Tolerated MLD and bandaging well today.  Daughter reports that her had has been less swollen since starting treatment    Clinical Impairments Affecting Rehab Potential  none    PT Frequency  3x / week    PT Duration  4 weeks    PT Treatment/Interventions  Compression bandaging;Manual lymph drainage;Manual techniques;Patient/family education;DME Instruction;Therapeutic exercise    PT Next Visit Plan  continue CDT, MLD Rt UE and compression ,show remedial exercises, check measurements, send info to alight?    PT Home Exercise Plan  compression bandaging       Patient will benefit from skilled therapeutic intervention in order to improve the following deficits and impairments:  Decreased skin integrity, Increased edema  Visit Diagnosis: Postmastectomy lymphedema  Aftercare following surgery for neoplasm  Abnormal posture     Problem List Patient Active Problem List   Diagnosis Date Noted  . 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 Preop cardiovascular exam 03/18/2017  . Essential hypertension 03/18/2017  . Dyslipidemia 03/18/2017  . Coronary artery disease involving native coronary artery of native heart without angina pectoris 03/18/2017  . Bruit 03/18/2017  . Lung metastases ([place]) 06/23/2016  . Malignant neoplasm of lower-inner quadrant of right breast of female,  estrogen receptor negative ([place]) 01/22/2016   Shan Levans, PT 12/20/2017, 11:59 AM  [place], [place], [address] Phone: [phone]   Fax:  [phone]  Name: Jennifer Garrett MRN: [id number] Date of Birth: March 28, 1932

## 2017-12-21 ENCOUNTER — Encounter: Payer: Self-pay | Admitting: Podiatry

## 2017-12-21 ENCOUNTER — Ambulatory Visit (INDEPENDENT_AMBULATORY_CARE_PROVIDER_SITE_OTHER): Payer: Medicare Other | Admitting: Podiatry

## 2017-12-21 DIAGNOSIS — M79609 Pain in unspecified limb: Secondary | ICD-10-CM | POA: Diagnosis not present

## 2017-12-21 DIAGNOSIS — B351 Tinea unguium: Secondary | ICD-10-CM

## 2017-12-21 DIAGNOSIS — E1140 Type 2 diabetes mellitus with diabetic neuropathy, unspecified: Secondary | ICD-10-CM

## 2017-12-21 NOTE — Progress Notes (Signed)
Patient ID: Jennifer Garrett, female   DOB: 06/06/1932, 84 y.o.   MRN: [id number] Complaint:  Visit Type: Patient returns to my office for continued preventative foot care services. Complaint: Patient states" my nails have grown long and thick and become painful to walk and wear shoes" Patient has been diagnosed with DM with neuropathy.. The patient presents for preventative foot care services. No changes to ROS  Podiatric Exam: Vascular: dorsalis pedis and posterior tibial pulses are palpable bilateral. Capillary return is immediate. Temperature gradient is WNL. Skin turgor WNL  Sensorium: Decreased  Semmes Weinstein monofilament test. Normal tactile sensation bilaterally. Nail Exam: Pt has thick disfigured discolored nails with subungual debris noted bilateral entire nail hallux through fifth toenails Ulcer Exam: There is no evidence of ulcer or pre-ulcerative changes or infection. Orthopedic Exam: Muscle tone and strength are WNL. No limitations in general ROM. No crepitus or effusions noted. HAV  B/L. Skin: No Porokeratosis. No infection or ulcers  Diagnosis:  Onychomycosis, ,Diabetes with neuropathy.  HAV  B/L.  Treatment & Plan Procedures and Treatment: Consent by patient was obtained for treatment procedures. The patient understood the discussion of treatment and procedures well. All questions were answered thoroughly reviewed. Debridement of mycotic and hypertrophic toenails, 1 through 5 bilateral and clearing of subungual debris. No ulceration, no infection noted. Re- Initiate diabetic footgear paperwork for DPN and HAV  B/L. ABN signed for 2019. Return Visit-Office Procedure: Patient instructed to return to the office for a follow up visit 3 months for continued evaluation and treatment.    Gardiner Barefoot DPM

## 2017-12-22 ENCOUNTER — Ambulatory Visit: Payer: Medicare Other

## 2017-12-22 DIAGNOSIS — I972 Postmastectomy lymphedema syndrome: Secondary | ICD-10-CM | POA: Diagnosis not present

## 2017-12-22 DIAGNOSIS — Z483 Aftercare following surgery for neoplasm: Secondary | ICD-10-CM

## 2017-12-22 DIAGNOSIS — R293 Abnormal posture: Secondary | ICD-10-CM

## 2017-12-22 NOTE — Therapy (Signed)
[place], [place], [address] Phone: [phone]   Fax:  [phone]  Physical Therapy Treatment  Patient Details  Name: Jennifer Garrett MRN: [id number] Date of Birth: 15-Nov-1931 Referring Provider: Sandi Mealy PA-C   Encounter Date: 12/22/2017  PT End of Session - 12/22/17 0850    Visit Number  5    Number of Visits  12    Date for PT Re-Evaluation  01/17/18    PT Start Time  0809    PT Stop Time  0850    PT Time Calculation (min)  41 min    Activity Tolerance  Patient tolerated treatment well    Behavior During Therapy  [name] for tasks assessed/performed       Past Medical History:  Diagnosis Date  . Arthritis   . Asthma    rarely uses neb  . Breast cancer ([place]) 05/25/2016   right breast  . Breast cancer of lower-inner quadrant of right female breast ([place]) 01/22/2016  . Carpal tunnel syndrome   . Coronary artery disease    DES to OM3 90% stenosis 2003, 90% small RCA stenosis.    . Diabetes mellitus    not taken glipizide in over a year, checks surgars daily  . History of stomach ulcers   . Hyperlipemia   . Hypertension   . Irregular heart beat     Past Surgical History:  Procedure Laterality Date  . ABDOMINAL HYSTERECTOMY    . CARDIAC CATHETERIZATION  2003   stent-  . CARPAL TUNNEL RELEASE  03/29/2012   Procedure: CARPAL TUNNEL RELEASE;  Surgeon: Wynonia Sours, MD;  Location: [place];  Service: Orthopedics;  Laterality: Left;  . EYE SURGERY     catracts  . LESION EXCISION WITH COMPLEX REPAIR Right 05/25/2016   Procedure: complex repair of 25cm wound;  Surgeon: Irene Limbo, MD;  Location: [place];  Service: Plastics;  Laterality: Right;  . MASTECTOMY     right  . MASTECTOMY MODIFIED RADICAL Right 05/25/2016   Procedure: RIGHT MASTECTOMY MODIFIED RADICAL;  Surgeon: Fanny Skates, MD;  Location: [place];  Service: General;  Laterality: Right;  . PUNCH BIOPSY OF SKIN Left  04/04/2017   Procedure: PUNCH BIOPSY OF LEFT BREAST SKIN;  Surgeon: Fanny Skates, MD;  Location: [place];  Service: General;  Laterality: Left;  . RADIOACTIVE SEED GUIDED AXILLARY SENTINEL LYMPH NODE Left 04/04/2017   Procedure: RADIOACTIVE SEED TARGETED LEFT AXILLARY LYMPH NODE EXCISION;  Surgeon: Fanny Skates, MD;  Location: [place];  Service: General;  Laterality: Left;  . SHOULDER ARTHROSCOPY     right and left  . TRIGGER FINGER RELEASE  03/29/2012   Procedure: RELEASE TRIGGER FINGER/A-1 PULLEY;  Surgeon: Wynonia Sours, MD;  Location: [place];  Service: Orthopedics;  Laterality: Left;    There were no vitals filed for this visit.  Subjective Assessment - 12/22/17 0812    Subjective  The bandage was good but my daughter had to take it off yesterday because the fingers were unraveling and starting to swell.     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  No/denies            LYMPHEDEMA/ONCOLOGY QUESTIONNAIRE -  12/22/17 0813      Right Upper Extremity Lymphedema   15 cm Proximal to Olecranon Process  35.2 cm    10 cm Proximal to Olecranon Process  36.4 cm    Olecranon Process  27.9 cm    15 cm Proximal to Ulnar Styloid Process  26.9 cm    10 cm Proximal to Ulnar Styloid Process  23 cm    Just Proximal to Ulnar Styloid Process  18 cm last time was probably 18.7, not 28    Across Hand at [employer]  19.3 cm    At [place] of 2nd Digit  6.1 cm           Outpatient [facility]  Lymphedema Life Impact Scale Total Score  8.82 %           [facility] Adult PT Treatment/Exercise - 12/22/17 0001      Manual Therapy   Manual  Lymphatic Drainage (MLD)  In supine: Short neck, 5 diaphragmatic breaths, Rt inguinal nodes, Rt axillo-inguinal anastomosis, and then Rt UE from lateral upper arm to dorsal hond working from proximal to distal then retracing all steps    Compression Bandaging  Lotion, thin stockinette, Elastomull to all fingers, Artiflex x1, and 1-6, 1-10 and 1-12 cm short stretch compression bandages from hand to axilla.                   PT Long Term Goals - 12/22/17 0900      PT LONG TERM GOAL #1   Title  Pt will be knowledgeable about lymphedema risk reduction practices to decrease the occurrence or worsening of lymphedema    Baseline  Pt has memory deficits so have been reviewing with pt and daughter who is present at each appointment-12/22/17    Status  Partially Met      PT LONG TERM GOAL #2   Title  pt will obtain appropriate compression garments and be knowledgeable about their use    Baseline  Have been discussing process with pt and daughter, plan to go to [facility] and bill [facility]-12/22/17    Status  Partially Met      PT LONG TERM GOAL #3   Title  pt will decrease circumferential measurements at the ulnar styloid process from 28.7 to 20cm and 10cm proximal to the olecranon from 38.4 to 30.4    Baseline  Ulnar styloid 18 cm and 10 cm proximal to olecranon 36.4 cm - 12/22/17    Status  Partially Met      PT LONG TERM GOAL #4   Title  pt will be independent with remedial exercises to improve lymphatic flow    Baseline  Have begun instructing pt in remedial exercises for UE, espceially when bandaged, but needs further review-12/22/17    Status  Partially Met            Plan - 12/22/17 0851    Clinical Impression Statement  Reminded pt and daughter of remedial exercises, especially when bandaged. Pt kept bandages on until yesterday when fingers started to unraveling and then swell. She reports tolerating bandages fine for time they were on. Remeasured her circumference of Rt UE and  it was well reduced  from last time measured. Spoke with daughter and pt and are agreeable to another week trial of bandaging with daugther wrapping at least once over weekend and performing MLD when bandages off, and then getting appointment for pt to  be measured for compression garments next week. Thinking daytime compreesion sleeve and glove, having pt go to [facility] and bill [facility].     Rehab Potential  Good    Clinical Impairments Affecting Rehab Potential  none    PT Frequency  3x / week    PT Duration  4 weeks    PT Treatment/Interventions  Compression bandaging;Manual lymph drainage;Manual techniques;Patient/family education;DME Instruction;Therapeutic exercise    PT Next Visit Plan  Issue handout for remedial exercises; assess how daughter performing bandaging and MLD over weekend went and continue CDT to Rt UE, send info to [facility]ight?    PT Home Exercise Plan  compression bandaging and self MLD    Consulted and Agree with Plan of Care  Patient       Patient will benefit from skilled therapeutic intervention in order to improve the following deficits and impairments:  Decreased skin integrity, Increased edema  Visit Diagnosis: Postmastectomy lymphedema  Aftercare following surgery for neoplasm  Abnormal posture     Problem List Patient Active Problem List   Diagnosis Date Noted  . 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 Preop cardiovascular exam 03/18/2017  . Essential hypertension 03/18/2017  . Dyslipidemia 03/18/2017  . Coronary artery disease involving native coronary artery of native heart without angina pectoris 03/18/2017  . Bruit 03/18/2017  . Lung metastases ([place]) 06/23/2016  . Malignant neoplasm of lower-inner quadrant of right breast of female, estrogen receptor negative ([place]) 01/22/2016    Otelia Limes, PTA 12/22/2017, 9:16 AM  [place], [place], [address] Phone: [phone]   Fax:  [phone]  Name: DARRIS CARACHURE MRN: [id number] Date of Birth: Feb 10, 1932

## 2017-12-27 ENCOUNTER — Ambulatory Visit: Payer: Medicare Other | Admitting: Rehabilitation

## 2017-12-27 ENCOUNTER — Encounter: Payer: Self-pay | Admitting: Rehabilitation

## 2017-12-27 DIAGNOSIS — I972 Postmastectomy lymphedema syndrome: Secondary | ICD-10-CM | POA: Diagnosis not present

## 2017-12-27 DIAGNOSIS — Z483 Aftercare following surgery for neoplasm: Secondary | ICD-10-CM

## 2017-12-27 NOTE — Therapy (Signed)
[place], [place], [address] Phone: [phone]   Fax:  [phone]  Physical Therapy Treatment  Patient Details  Name: Jennifer Garrett MRN: [id number] Date of Birth: [birth year] Referring Provider: Sandi Mealy PA-C   Encounter Date: 12/27/2017  PT End of Session - 12/27/17 0855    Visit Number  6    Number of Visits  12    Date for PT Re-Evaluation  01/17/18    Authorization Type  Medicare    PT Start Time  0818    PT Stop Time  0906    PT Time Calculation (min)  48 min    Activity Tolerance  Patient tolerated treatment well    Behavior During Therapy  [facility] for tasks assessed/performed       Past Medical History:  Diagnosis Date  . Arthritis   . Asthma    rarely uses neb  . Breast cancer ([place]) 05/25/2016   right breast  . Breast cancer of lower-inner quadrant of right female breast ([place]) 01/22/2016  . Carpal tunnel syndrome   . Coronary artery disease    DES to OM3 90% stenosis 2003, 90% small RCA stenosis.    . Diabetes mellitus    not taken glipizide in over a year, checks surgars daily  . History of stomach ulcers   . Hyperlipemia   . Hypertension   . Irregular heart beat     Past Surgical History:  Procedure Laterality Date  . ABDOMINAL HYSTERECTOMY    . CARDIAC CATHETERIZATION  2003   stent-  . CARPAL TUNNEL RELEASE  03/29/2012   Procedure: CARPAL TUNNEL RELEASE;  Surgeon: Wynonia Sours, MD;  Location: [place];  Service: Orthopedics;  Laterality: Left;  . EYE SURGERY     catracts  . LESION EXCISION WITH COMPLEX REPAIR Right 05/25/2016   Procedure: complex repair of 25cm wound;  Surgeon: Irene Limbo, MD;  Location: [place];  Service: Plastics;  Laterality: Right;  . MASTECTOMY     right  . MASTECTOMY MODIFIED RADICAL Right 05/25/2016   Procedure: RIGHT MASTECTOMY MODIFIED RADICAL;  Surgeon: Fanny Skates, MD;  Location: [place];  Service: General;  Laterality:  Right;  . PUNCH BIOPSY OF SKIN Left 04/04/2017   Procedure: PUNCH BIOPSY OF LEFT BREAST SKIN;  Surgeon: Fanny Skates, MD;  Location: [place];  Service: General;  Laterality: Left;  . RADIOACTIVE SEED GUIDED AXILLARY SENTINEL LYMPH NODE Left 04/04/2017   Procedure: RADIOACTIVE SEED TARGETED LEFT AXILLARY LYMPH NODE EXCISION;  Surgeon: Fanny Skates, MD;  Location: [place];  Service: General;  Laterality: Left;  . SHOULDER ARTHROSCOPY     right and left  . TRIGGER FINGER RELEASE  03/29/2012   Procedure: RELEASE TRIGGER FINGER/A-1 PULLEY;  Surgeon: Wynonia Sours, MD;  Location: [place];  Service: Orthopedics;  Laterality: Left;    There were no vitals filed for this visit.  Subjective Assessment - 12/27/17 0824    Subjective  Tried to bandage x 1 at home but it fell off.  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  No/denies  Outpatient Rehab from [facility]  Lymphedema Life Impact Scale Total Score  8.82 %           [facility] Adult PT Treatment/Exercise - 12/27/17 0001      Manual Therapy   Manual Lymphatic Drainage (MLD)  In supine: Short neck, 5 diaphragmatic breaths, Rt inguinal nodes, Rt axillo-inguinal anastomosis, and then Rt UE from lateral upper arm to dorsal hond working from proximal to distal then retracing all steps    Compression Bandaging  Lotion, thin stockinette, Elastomull to all fingers, Artiflex x1, and 1-6, 1-10 and 1-12 cm short stretch compression bandages from hand to axilla.                   PT Long Term Goals - 12/22/17 0900       PT LONG TERM GOAL #1   Title  Pt will be knowledgeable about lymphedema risk reduction practices to decrease the occurrence or worsening of lymphedema    Baseline  Pt has memory deficits so have been reviewing with pt and daughter who is present at each appointment-12/22/17    Status  Partially Met      PT LONG TERM GOAL #2   Title  pt will obtain appropriate compression garments and be knowledgeable about their use    Baseline  Have been discussing process with pt and daughter, plan to go to [facility] and bill [facility]-12/22/17    Status  Partially Met      PT LONG TERM GOAL #3   Title  pt will decrease circumferential measurements at the ulnar styloid process from 28.7 to 20cm and 10cm proximal to the olecranon from 38.4 to 30.4    Baseline  Ulnar styloid 18 cm and 10 cm proximal to olecranon 36.4 cm - 12/22/17    Status  Partially Met      PT LONG TERM GOAL #4   Title  pt will be independent with remedial exercises to improve lymphatic flow    Baseline  Have begun instructing pt in remedial exercises for UE, espceially when bandaged, but needs further review-12/22/17    Status  Partially Met            Plan - 12/27/17 0855    Clinical Impression Statement  Pt continues with no complaints about the UE but still an increase in size of the upper extremity proximally.  Daughter seems to have a hard time with self bandaging and the finger bandages keep falling off, so getting pt into a garment will be important.  Discussed getting things through alight.  Will send off prescription today for compression.  Pt making more comments about the Lt UE being "tight and with some numbness" but that it is not something new.  Denies any other adverse symptoms/signs.      Rehab Potential  Good    PT Frequency  3x / week    PT Duration  4 weeks    PT Treatment/Interventions  Compression bandaging;Manual lymph drainage;Manual techniques;Patient/family education;DME Instruction;Therapeutic exercise     PT Next Visit Plan  daughter want to try bandaging again? remedial exercises handout, prescription back?    PT Home Exercise Plan  compression bandaging and self MLD       Patient will benefit from skilled therapeutic intervention in order to improve the following deficits and impairments:  Decreased skin integrity, Increased edema  Visit Diagnosis: Postmastectomy lymphedema  Aftercare following surgery for neoplasm     Problem List  Patient Active Problem List   Diagnosis Date Noted  . 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 Preop cardiovascular exam 03/18/2017  . Essential hypertension 03/18/2017  . Dyslipidemia 03/18/2017  . Coronary artery disease involving native coronary artery of native heart without angina pectoris 03/18/2017  . Bruit 03/18/2017  . Lung metastases ([place]) 06/23/2016  . Malignant neoplasm of lower-inner quadrant of right breast of female, estrogen receptor negative ([place]) 01/22/2016    Shan Levans, PT 12/27/2017, [place], [place], [address] Phone: [phone]   Fax:  [phone]  Name: GESELLE CARDOSA MRN: [id number] Date of Birth: [birth year]

## 2017-12-29 ENCOUNTER — Ambulatory Visit: Payer: Medicare Other | Admitting: Rehabilitation

## 2017-12-29 ENCOUNTER — Encounter: Payer: Self-pay | Admitting: Rehabilitation

## 2017-12-29 DIAGNOSIS — Z483 Aftercare following surgery for neoplasm: Secondary | ICD-10-CM

## 2017-12-29 DIAGNOSIS — I972 Postmastectomy lymphedema syndrome: Secondary | ICD-10-CM

## 2017-12-29 DIAGNOSIS — R293 Abnormal posture: Secondary | ICD-10-CM

## 2017-12-29 NOTE — Patient Instructions (Signed)
Access Code: [email or web address]  URL: [email or web address]  Date: 12/29/2017  Prepared by: Shan Levans   Exercises  Sawing with Compression Garment - 15 reps - 1 sets - 2x daily - 7x weekly  Trunk Sidebending with Compression Garment - 15 reps - 1 sets - 2x daily - 7x weekly  Scapular Retraction with Compression Garment - 15 reps - 1 sets - 2x daily - 7x weekly  Bicep Curls with Compression Garment - 15 reps - 1 sets - 2x daily - 7x weekly  Punch Up with Compression Garment - 15 reps - 1 sets - 2x daily - 7x weekly  Seated Shoulder Circles with Compression Garment - 15 reps - 1 sets - 2x daily - 7x weekly  Wrist Flexion and Extension with Compression Garment - 15 reps - 1 sets - 2x daily - 7x weekly  Seated Diaphragmatic Breathing - 15 reps - 1 sets - 2x daily - 7x weekly  Patient Education  Lymphedema: Exercise Considerations

## 2017-12-29 NOTE — Therapy (Signed)
[place], [place], [address] Phone: [phone]   Fax:  [phone]  Physical Therapy Treatment  Patient Details  Name: Jennifer Garrett MRN: [id number] Date of Birth: 19-Jan-1932 Referring Provider: Sandi Mealy PA-C   Encounter Date: 12/29/2017  PT End of Session - 12/29/17 1711    Visit Number  7    Number of Visits  12    Date for PT Re-Evaluation  01/17/18    Authorization Type  Medicare    PT Start Time  1432    PT Stop Time  1517    PT Time Calculation (min)  45 min    Activity Tolerance  Patient tolerated treatment well    Behavior During Therapy  [facility] for tasks assessed/performed       Past Medical History:  Diagnosis Date  . Arthritis   . Asthma    rarely uses neb  . Breast cancer ([place]) 05/25/2016   right breast  . Breast cancer of lower-inner quadrant of right female breast ([place]) 01/22/2016  . Carpal tunnel syndrome   . Coronary artery disease    DES to OM3 90% stenosis 2003, 90% small RCA stenosis.    . Diabetes mellitus    not taken glipizide in over a year, checks surgars daily  . History of stomach ulcers   . Hyperlipemia   . Hypertension   . Irregular heart beat     Past Surgical History:  Procedure Laterality Date  . ABDOMINAL HYSTERECTOMY    . CARDIAC CATHETERIZATION  2003   stent-  . CARPAL TUNNEL RELEASE  03/29/2012   Procedure: CARPAL TUNNEL RELEASE;  Surgeon: Wynonia Sours, MD;  Location: [place];  Service: Orthopedics;  Laterality: Left;  . EYE SURGERY     catracts  . LESION EXCISION WITH COMPLEX REPAIR Right 05/25/2016   Procedure: complex repair of 25cm wound;  Surgeon: Irene Limbo, MD;  Location: [place];  Service: Plastics;  Laterality: Right;  . MASTECTOMY     right  . MASTECTOMY MODIFIED RADICAL Right 05/25/2016   Procedure: RIGHT MASTECTOMY MODIFIED RADICAL;  Surgeon: Fanny Skates, MD;  Location: [place];  Service: General;  Laterality:  Right;  . PUNCH BIOPSY OF SKIN Left 04/04/2017   Procedure: PUNCH BIOPSY OF LEFT BREAST SKIN;  Surgeon: Fanny Skates, MD;  Location: [place];  Service: General;  Laterality: Left;  . RADIOACTIVE SEED GUIDED AXILLARY SENTINEL LYMPH NODE Left 04/04/2017   Procedure: RADIOACTIVE SEED TARGETED LEFT AXILLARY LYMPH NODE EXCISION;  Surgeon: Fanny Skates, MD;  Location: [place];  Service: General;  Laterality: Left;  . SHOULDER ARTHROSCOPY     right and left  . TRIGGER FINGER RELEASE  03/29/2012   Procedure: RELEASE TRIGGER FINGER/A-1 PULLEY;  Surgeon: Wynonia Sours, MD;  Location: [place];  Service: Orthopedics;  Laterality: Left;    There were no vitals filed for this visit.  Subjective Assessment - 12/29/17 1709    Subjective  Nothing new to report.  Arrives with bandages still on this time but the thumb still falling off    Patient is accompained by:  Family member    Pertinent History  2 strokes, poor recall and memory, bilateral triple negative breast cancer with mets to the lungs treated with Rt modified radical mastectomy and ALND of 21 nodes, Lt lumpectomy with removal of 2 nodes, bilateral radiation and chemotherapy    Currently in Pain?  No/denies  LYMPHEDEMA/ONCOLOGY QUESTIONNAIRE - 12/29/17 1502      Right Upper Extremity Lymphedema   15 cm Proximal to Olecranon Process  36.5 cm    10 cm Proximal to Olecranon Process  36 cm           Outpatient Rehab from 12/06/2017 in [place]  Lymphedema Life Impact Scale Total Score  8.82 %           [facility] Adult PT Treatment/Exercise - 12/29/17 0001      Exercises   Exercises  Other Exercises    Other Exercises   issued  remedial exercise handout for use at home      Manual Therapy   Manual Lymphatic Drainage (MLD)  In supine: Short neck, 5 diaphragmatic breaths, Rt inguinal nodes, Rt axillo-inguinal anastomosis, and then  Rt UE from lateral upper arm to dorsal hond working from proximal to distal then retracing all steps    Compression Bandaging  Lotion, thin stockinette, Elastomull to all fingers, Artiflex x1, and 1-6, 1-10 and 1-12 cm short stretch compression bandages from hand to axilla.                   PT Long Term Goals - 12/29/17 1715      PT LONG TERM GOAL #1   Title  Pt will be knowledgeable about lymphedema risk reduction practices to decrease the occurrence or worsening of lymphedema    Status  Partially Met      PT LONG TERM GOAL #2   Title  pt will obtain appropriate compression garments and be knowledgeable about their use    Status  Partially Met      PT LONG TERM GOAL #3   Title  pt will decrease circumferential measurements at the ulnar styloid process from 28.7 to 20cm and 10cm proximal to the olecranon from 38.4 to 30.4    Status  On-going      PT LONG TERM GOAL #4   Title  pt will be independent with remedial exercises to improve lymphatic flow    Status  On-going            Plan - 12/29/17 1712    Clinical Impression Statement  Pt arrives with compression still on today and the UE much softer.  The proximal measures are similar to last measure so she may be reducing to a new baseline.  Discussed with pt's daughter to get her second to nature appt set up and then work with [place] to make sure she is bandaged right when or before she goes to get measured .  Pt given prescription and [facility]ight paperwork today.      PT Frequency  3x / week    PT Duration  4 weeks    PT Treatment/Interventions  Compression bandaging;Manual lymph drainage;Manual techniques;Patient/family education;DME Instruction;Therapeutic exercise    PT Next Visit Plan  go over remedial exercises quickly.  Did they get an appt made for compression garments? Do we need to change any appts for her to get measured right after being wrapped?, continue CDT       Patient will benefit from skilled therapeutic  intervention in order to improve the following deficits and impairments:  Decreased skin integrity, Increased edema  Visit Diagnosis: Postmastectomy lymphedema  Aftercare following surgery for neoplasm  Abnormal posture     Problem List Patient Active Problem List   Diagnosis Date Noted  . 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 Preop cardiovascular exam  03/18/2017  . Essential hypertension 03/18/2017  . Dyslipidemia 03/18/2017  . Coronary artery disease involving native coronary artery of native heart without angina pectoris 03/18/2017  . Bruit 03/18/2017  . Lung metastases ([place]) 06/23/2016  . Malignant neoplasm of lower-inner quadrant of right breast of female, estrogen receptor negative ([place]) 01/22/2016    Shan Levans, PT 12/29/2017, 5:16 PM  [place], [place], [address] Phone: [phone]   Fax:  [phone]  Name: Jennifer Garrett MRN: [id number] Date of Birth: 08-11-1931

## 2018-01-03 ENCOUNTER — Ambulatory Visit: Payer: Medicare Other | Admitting: Physical Therapy

## 2018-01-05 ENCOUNTER — Ambulatory Visit: Payer: Medicare Other | Admitting: Rehabilitation

## 2018-01-05 ENCOUNTER — Encounter: Payer: Self-pay | Admitting: Rehabilitation

## 2018-01-05 DIAGNOSIS — I972 Postmastectomy lymphedema syndrome: Secondary | ICD-10-CM | POA: Diagnosis not present

## 2018-01-05 DIAGNOSIS — M6281 Muscle weakness (generalized): Secondary | ICD-10-CM

## 2018-01-05 DIAGNOSIS — Z483 Aftercare following surgery for neoplasm: Secondary | ICD-10-CM

## 2018-01-05 NOTE — Therapy (Signed)
[place], [place], [address] Phone: [phone]   Fax:  [phone]  Physical Therapy Treatment  Patient Details  Name: Jennifer Garrett MRN: [id number] Date of Birth: Jan 05, 1932 Referring Provider: Sandi Mealy PA-C   Encounter Date: 01/05/2018  PT End of Session - 01/05/18 1710    Visit Number  8    Number of Visits  12    Date for PT Re-Evaluation  01/17/18    Authorization Type  Medicare    PT Start Time  4967    PT Stop Time  1608    PT Time Calculation (min)  51 min    Activity Tolerance  Patient tolerated treatment well    Behavior During Therapy  [facility] for tasks assessed/performed       Past Medical History:  Diagnosis Date  . Arthritis   . Asthma    rarely uses neb  . Breast cancer ([place]) 05/25/2016   right breast  . Breast cancer of lower-inner quadrant of right female breast ([place]) 01/22/2016  . Carpal tunnel syndrome   . Coronary artery disease    DES to OM3 90% stenosis 2003, 90% small RCA stenosis.    . Diabetes mellitus    not taken glipizide in over a year, checks surgars daily  . History of stomach ulcers   . Hyperlipemia   . Hypertension   . Irregular heart beat     Past Surgical History:  Procedure Laterality Date  . ABDOMINAL HYSTERECTOMY    . CARDIAC CATHETERIZATION  2003   stent-  . CARPAL TUNNEL RELEASE  03/29/2012   Procedure: CARPAL TUNNEL RELEASE;  Surgeon: Wynonia Sours, MD;  Location: [place];  Service: Orthopedics;  Laterality: Left;  . EYE SURGERY     catracts  . LESION EXCISION WITH COMPLEX REPAIR Right 05/25/2016   Procedure: complex repair of 25cm wound;  Surgeon: Irene Limbo, MD;  Location: [place];  Service: Plastics;  Laterality: Right;  . MASTECTOMY     right  . MASTECTOMY MODIFIED RADICAL Right 05/25/2016   Procedure: RIGHT MASTECTOMY MODIFIED RADICAL;  Surgeon: Fanny Skates, MD;  Location: [place];  Service: General;  Laterality:  Right;  . PUNCH BIOPSY OF SKIN Left 04/04/2017   Procedure: PUNCH BIOPSY OF LEFT BREAST SKIN;  Surgeon: Fanny Skates, MD;  Location: [place];  Service: General;  Laterality: Left;  . RADIOACTIVE SEED GUIDED AXILLARY SENTINEL LYMPH NODE Left 04/04/2017   Procedure: RADIOACTIVE SEED TARGETED LEFT AXILLARY LYMPH NODE EXCISION;  Surgeon: Fanny Skates, MD;  Location: [place];  Service: General;  Laterality: Left;  . SHOULDER ARTHROSCOPY     right and left  . TRIGGER FINGER RELEASE  03/29/2012   Procedure: RELEASE TRIGGER FINGER/A-1 PULLEY;  Surgeon: Wynonia Sours, MD;  Location: [place];  Service: Orthopedics;  Laterality: Left;    There were no vitals filed for this visit.  Subjective Assessment - 01/05/18 1518    Subjective  Nothing new to report .  It is back to big.  The bandages came off the same day.  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  No/denies  LYMPHEDEMA/ONCOLOGY QUESTIONNAIRE - 01/05/18 1527      Right Upper Extremity Lymphedema   15 cm Proximal to Olecranon Process  37 cm    10 cm Proximal to Olecranon Process  38 cm    Olecranon Process  30 cm    15 cm Proximal to Ulnar Styloid Process  28 cm    10 cm Proximal to Ulnar Styloid Process  24.5 cm    Just Proximal to Ulnar Styloid Process  20.7 cm    Across Hand at [employer]  21 cm    At [place] of 2nd Digit  6.4 cm           Outpatient Re[facility] from [facility]  Lymphedema Life Impact Scale Total Score  8.82 %           [facility] Adult PT Treatment/Exercise - 01/05/18 0001      Manual Therapy   Edema Management  conversation with daughter  regarding trying to increase to 3x per week or trying reduction kit due to poor compliance now and increased measurements.  Pt agreeable to try 3x per week for 1-2weeks with final measurements occuring after the last visit either by [place] or a special place    Manual Lymphatic Drainage (MLD)  In supine:  Rt inguinal nodes, Rt axillo-inguinal anastomosis, and then Rt UE from lateral upper arm to dorsal hond working from proximal to distal then retracing all steps    Compression Bandaging  Lotion, thin stockinette, Elastomull to all fingers, Artiflex x1, and 1-6, 1-10 and 1-12 cm short stretch compression bandages from hand to axilla.                   PT Long Term Goals - 01/05/18 1713      PT LONG TERM GOAL #1   Title  Pt will be knowledgeable about lymphedema risk reduction practices to decrease the occurrence or worsening of lymphedema    Status  On-going      PT LONG TERM GOAL #2   Title  pt will obtain appropriate compression garments and be knowledgeable about their use    Status  On-going      PT LONG TERM GOAL #3   Title  pt will decrease circumferential measurements at the ulnar styloid process from 28.7 to 20cm and 10cm proximal to the olecranon from 38.4 to 30.4    Status  On-going      PT LONG TERM GOAL #4   Title  pt will be independent with remedial exercises to improve lymphatic flow    Status  On-going            Plan - 01/05/18 1711    Clinical Impression Statement  Despite improvements on the last session she arrives today without the bandages on x almost one week and with sizes worse than baseline.  conversation with daughter regarding trying to increase to 3x per week or trying reduction kit due to poor compliance now and increased measurements.  Pt agreeable to try 3x per week for 1-2weeks with final measurements occuring after the last visit either by [place] or a special place    Rehab Potential  Good    PT Frequency  3x / week    PT Duration  4 weeks     PT Treatment/Interventions  Compression bandaging;Manual lymph drainage;Manual techniques;Patient/family education;DME Instruction;Therapeutic exercise    PT Next Visit Plan  go over remedial exercises quickly.  Did they get an appt  made for compression garments? Do we need to change any appts for her to get measured right after being wrapped?, continue CDT    PT Home Exercise Plan  compression bandaging and self MLD       Patient will benefit from skilled therapeutic intervention in order to improve the following deficits and impairments:  Decreased skin integrity, Increased edema  Visit Diagnosis: Postmastectomy lymphedema  Aftercare following surgery for neoplasm  Muscle weakness (generalized)     Problem List Patient Active Problem List   Diagnosis Date Noted  . 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 Preop cardiovascular exam 03/18/2017  . Essential hypertension 03/18/2017  . Dyslipidemia 03/18/2017  . Coronary artery disease involving native coronary artery of native heart without angina pectoris 03/18/2017  . Bruit 03/18/2017  . Lung metastases ([place]) 06/23/2016  . Malignant neoplasm of lower-inner quadrant of right breast of female, estrogen receptor negative ([place]) 01/22/2016    Shan Levans, PT 01/05/2018, 5:14 PM  [place] [address] [place], [place], [address] Phone: [phone]   Fax:  [phone]  Name: Jennifer Garrett MRN: [id number] Date of Birth: 03-Aug-1931

## 2018-01-16 ENCOUNTER — Ambulatory Visit: Payer: Medicare Other | Attending: Medical

## 2018-01-16 DIAGNOSIS — R293 Abnormal posture: Secondary | ICD-10-CM | POA: Insufficient documentation

## 2018-01-16 DIAGNOSIS — Z483 Aftercare following surgery for neoplasm: Secondary | ICD-10-CM | POA: Insufficient documentation

## 2018-01-16 DIAGNOSIS — M6281 Muscle weakness (generalized): Secondary | ICD-10-CM | POA: Insufficient documentation

## 2018-01-16 DIAGNOSIS — I972 Postmastectomy lymphedema syndrome: Secondary | ICD-10-CM | POA: Diagnosis not present

## 2018-01-16 NOTE — Therapy (Signed)
[place], [place], [address] Phone: [phone]   Fax:  [phone]  Physical Therapy Treatment  Patient Details  Name: Jennifer Garrett MRN: [id number] Date of Birth: 08/18/1931 Referring Provider: Sandi Mealy PA-C   Encounter Date: 01/16/2018  PT End of Session - 01/16/18 0846    Visit Number  9    Number of Visits  12    Date for PT Re-Evaluation  01/17/18    PT Start Time  0819 Pt arrived late    PT Stop Time  0843    PT Time Calculation (min)  24 min    Activity Tolerance  Patient tolerated treatment well    Behavior During Therapy  [facility] for tasks assessed/performed       Past Medical History:  Diagnosis Date  . Arthritis   . Asthma    rarely uses neb  . Breast cancer ([place]) 05/25/2016   right breast  . Breast cancer of lower-inner quadrant of right female breast ([place]) 01/22/2016  . Carpal tunnel syndrome   . Coronary artery disease    DES to OM3 90% stenosis 2003, 90% small RCA stenosis.    . Diabetes mellitus    not taken glipizide in over a year, checks surgars daily  . History of stomach ulcers   . Hyperlipemia   . Hypertension   . Irregular heart beat     Past Surgical History:  Procedure Laterality Date  . ABDOMINAL HYSTERECTOMY    . CARDIAC CATHETERIZATION  2003   stent-  . CARPAL TUNNEL RELEASE  03/29/2012   Procedure: CARPAL TUNNEL RELEASE;  Surgeon: Wynonia Sours, MD;  Location: [place];  Service: Orthopedics;  Laterality: Left;  . EYE SURGERY     catracts  . LESION EXCISION WITH COMPLEX REPAIR Right 05/25/2016   Procedure: complex repair of 25cm wound;  Surgeon: Irene Limbo, MD;  Location: [place];  Service: Plastics;  Laterality: Right;  . MASTECTOMY     right  . MASTECTOMY MODIFIED RADICAL Right 05/25/2016   Procedure: RIGHT MASTECTOMY MODIFIED RADICAL;  Surgeon: Fanny Skates, MD;  Location: [place];  Service: General;  Laterality: Right;  . PUNCH BIOPSY  OF SKIN Left 04/04/2017   Procedure: PUNCH BIOPSY OF LEFT BREAST SKIN;  Surgeon: Fanny Skates, MD;  Location: [place];  Service: General;  Laterality: Left;  . RADIOACTIVE SEED GUIDED AXILLARY SENTINEL LYMPH NODE Left 04/04/2017   Procedure: RADIOACTIVE SEED TARGETED LEFT AXILLARY LYMPH NODE EXCISION;  Surgeon: Fanny Skates, MD;  Location: [place];  Service: General;  Laterality: Left;  . SHOULDER ARTHROSCOPY     right and left  . TRIGGER FINGER RELEASE  03/29/2012   Procedure: RELEASE TRIGGER FINGER/A-1 PULLEY;  Surgeon: Wynonia Sours, MD;  Location: [place];  Service: Orthopedics;  Laterality: Left;    There were no vitals filed for this visit.  Subjective Assessment - 01/16/18 0824    Subjective  The bandages came off again about that same day but my daughter did do my massage some over the weekend. The thumb always slides off first (bandage).     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  No/denies  Outpatient Rehab from [facility]/06/2017 [facility]  Lymphedema Life Impact Scale Total Score  8.82 %           [facility] Adult PT Treatment/Exercise - 01/16/18 0001      Manual Therapy   Compression Bandaging  Lotion, thin stockinette, Elastomull to all fingers with paper tape to keep bandages in place (double at thumb and attached this to hand bandage), Artiflex x1, and 1-6, 1-10 and 1-12 cm short stretch compression bandages from hand to axilla, had to issue new bandages as pt forgot hers and wrapped slightly tighter as dauther reports her bandages keep coming off later the same day as appt                  PT Long Term Goals -  01/05/18 1713      PT LONG TERM GOAL #1   Title  Pt will be knowledgeable about lymphedema risk reduction practices to decrease the occurrence or worsening of lymphedema    Status  On-going      PT LONG TERM GOAL #2   Title  pt will obtain appropriate compression garments and be knowledgeable about their use    Status  On-going      PT LONG TERM GOAL #3   Title  pt will decrease circumferential measurements at the ulnar styloid process from 28.7 to 20cm and 10cm proximal to the olecranon from 38.4 to 30.4    Status  On-going      PT LONG TERM GOAL #4   Title  pt will be independent with remedial exercises to improve lymphatic flow    Status  On-going            Plan - 01/16/18 0847    Clinical Impression Statement  Pt arrived 20 mins late for session today so only had time to wrap her. She also had forgotten bandages so issued new ones since she does have 3 appts this week and next per discussion with PT at last visit. Pt reported bandages felt good by end of session even though therapist wrapped them slightly tighter to prevent them from slipping so quickly as they report they've been doing.     Rehab Potential  Good    Clinical Impairments Affecting Rehab Potential  none    PT Frequency  3x / week    PT Duration  4 weeks    PT Treatment/Interventions  Compression bandaging;Manual lymph drainage;Manual techniques;Patient/family education;DME Instruction;Therapeutic exercise    PT Next Visit Plan  Renewal needed at next visit. go over remedial exercises quickly.  Did they get an appt made for compression garments? Do we need to change any appts for her to get measured right after being wrapped?, continue CDT    Consulted and Agree with Plan of Care  Patient       Patient will benefit from skilled therapeutic intervention in order to improve the following deficits and impairments:  Decreased skin integrity, Increased edema  Visit Diagnosis: Postmastectomy  lymphedema  Aftercare following surgery for neoplasm     Problem List Patient Active Problem List   Diagnosis Date Noted  . 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 Preop cardiovascular exam 03/18/2017  . Essential hypertension 03/18/2017  . Dyslipidemia 03/18/2017  . Coronary artery disease involving native coronary artery of native heart without angina pectoris 03/18/2017  . Bruit 03/18/2017  . Lung metastases ([place]) 06/23/2016  . Malignant neoplasm of lower-inner quadrant of right breast of female,  estrogen receptor negative ([place]) 01/22/2016    Otelia Limes, PTA 01/16/2018, 8:50 AM  [place], [place], [address] Phone: [phone]   Fax:  [phone]  Name: Jennifer Garrett MRN: [id number] Date of Birth: 29-Nov-1931

## 2018-01-18 ENCOUNTER — Ambulatory Visit: Payer: Medicare Other | Admitting: Physical Therapy

## 2018-01-18 DIAGNOSIS — R293 Abnormal posture: Secondary | ICD-10-CM

## 2018-01-18 DIAGNOSIS — I972 Postmastectomy lymphedema syndrome: Secondary | ICD-10-CM

## 2018-01-18 DIAGNOSIS — M6281 Muscle weakness (generalized): Secondary | ICD-10-CM

## 2018-01-18 DIAGNOSIS — Z483 Aftercare following surgery for neoplasm: Secondary | ICD-10-CM

## 2018-01-18 NOTE — Therapy (Signed)
Progress Note Reporting Period 12/06/17 to 01/18/18  See note below for Objective Data and Assessment of Progress/Goals.       [place], [place], [address] Phone: [phone]   Fax:  [phone]  Physical Therapy Treatment  Patient Details  Name: Jennifer Garrett MRN: [id number] Date of Birth: 1931-08-15 Referring Provider: Sandi Mealy PA-C   Encounter Date: 01/18/2018  PT End of Session - 01/18/18 1712    Visit Number  10    Number of Visits  22    Date for PT Re-Evaluation  02/24/18    Authorization Type  Medicare    PT Start Time  1520    PT Stop Time  1605    PT Time Calculation (min)  45 min    Activity Tolerance  Patient tolerated treatment well    Behavior During Therapy  [facility] for tasks assessed/performed       Past Medical History:  Diagnosis Date  . Arthritis   . Asthma    rarely uses neb  . Breast cancer ([place]) 05/25/2016   right breast  . Breast cancer of lower-inner quadrant of right female breast ([place]) 01/22/2016  . Carpal tunnel syndrome   . Coronary artery disease    DES to OM3 90% stenosis 2003, 90% small RCA stenosis.    . Diabetes mellitus    not taken glipizide in over a year, checks surgars daily  . History of stomach ulcers   . Hyperlipemia   . Hypertension   . Irregular heart beat     Past Surgical History:  Procedure Laterality Date  . ABDOMINAL HYSTERECTOMY    . CARDIAC CATHETERIZATION  2003   stent-  . CARPAL TUNNEL RELEASE  03/29/2012   Procedure: CARPAL TUNNEL RELEASE;  Surgeon: Wynonia Sours, MD;  Location: [place];  Service: Orthopedics;  Laterality: Left;  . EYE SURGERY     catracts  . LESION EXCISION WITH COMPLEX REPAIR Right 05/25/2016   Procedure: complex repair of 25cm wound;  Surgeon: Irene Limbo, MD;  Location: [place];  Service: Plastics;  Laterality: Right;  . MASTECTOMY     right  . MASTECTOMY MODIFIED RADICAL Right 05/25/2016    Procedure: RIGHT MASTECTOMY MODIFIED RADICAL;  Surgeon: Fanny Skates, MD;  Location: [place];  Service: General;  Laterality: Right;  . PUNCH BIOPSY OF SKIN Left 04/04/2017   Procedure: PUNCH BIOPSY OF LEFT BREAST SKIN;  Surgeon: Fanny Skates, MD;  Location: [place];  Service: General;  Laterality: Left;  . RADIOACTIVE SEED GUIDED AXILLARY SENTINEL LYMPH NODE Left 04/04/2017   Procedure: RADIOACTIVE SEED TARGETED LEFT AXILLARY LYMPH NODE EXCISION;  Surgeon: Fanny Skates, MD;  Location: [place];  Service: General;  Laterality: Left;  . SHOULDER ARTHROSCOPY     right and left  . TRIGGER FINGER RELEASE  03/29/2012   Procedure: RELEASE TRIGGER FINGER/A-1 PULLEY;  Surgeon: Wynonia Sours, MD;  Location: [place];  Service: Orthopedics;  Laterality: Left;    There were no vitals filed for this visit.  Subjective Assessment - 01/18/18 1521    Subjective  Did okay with the bandages. "Let me ask about this (tight tendon in left hand at palm)."    Pertinent History  2 strokes, poor recall and memory, bilateral triple negative breast cancer with mets to the lungs treated with Rt modified radical mastectomy and ALND of 21 nodes, Lt lumpectomy with removal of 2 nodes,  bilateral radiation and chemotherapy    Currently in Pain?  No/denies            LYMPHEDEMA/ONCOLOGY QUESTIONNAIRE - 01/18/18 1527      Right Upper Extremity Lymphedema   15 cm Proximal to Olecranon Process  35.9 cm    10 cm Proximal to Olecranon Process  36.6 cm    Olecranon Process  28.5 cm    15 cm Proximal to Ulnar Styloid Process  27.4 cm    10 cm Proximal to Ulnar Styloid Process  23.6 cm    Just Proximal to Ulnar Styloid Process  17.8 cm    Across Hand at [employer]  20 cm    At [place] of 2nd Digit  6.4 cm           Outpatient Re[facility] from [facility]06/[facility]  Lymphedema Life Impact Scale Total Score  8.82 %            [facility] Adult PT Treatment/Exercise - 01/18/18 0001      Manual Therapy   Manual Lymphatic Drainage (MLD)  In supine: Short neck, 5 diaphragmatic breaths, Rt inguinal nodes, Rt axillo-inguinal anastomosis, and then Rt UE from lateral upper arm to dorsal hond working from proximal to distal then retracing all steps    Compression Bandaging  Lotion, thin stockinette, Elastomull to all fingers with paper tape to keep bandages in place (double at thumb and attached this to hand bandage), Artiflex x1, and 1-6, 1-10 and 1-12 cm short stretch compression bandages from hand to axilla.                  PT Long Term Goals - 01/18/18 1715      PT LONG TERM GOAL #1   Title  Pt will be knowledgeable about lymphedema risk reduction practices to decrease the occurrence or worsening of lymphedema    Baseline  Pt has memory deficits so have been reviewing with pt and daughter who is present at each appointment-12/22/17    Status  On-going      PT LONG TERM GOAL #2   Title  pt will obtain appropriate compression garments and be knowledgeable about their use    Baseline  Have been discussing process with pt and daughter, plan to go to [facility] and bill [place]-12/22/17    Status  On-going      PT LONG TERM GOAL #3   Title  pt will decrease circumferential measurements at the ulnar styloid process from 28.7 to 20cm and 10cm proximal to the olecranon from 38.4 to 30.4    Baseline  Ulnar styloid 18 cm and 10 cm proximal to olecranon 36.4 cm - 12/22/17    Status  On-going      PT LONG TERM GOAL #4   Title  pt will be independent with remedial exercises to improve lymphatic flow    Baseline  Have begun instructing pt in remedial exercises for UE, espceially when bandaged, but needs further review-12/22/17    Status  On-going            Plan - 01/18/18 1713    Clinical Impression Statement  Circumference measurements taken today and patient showed good reductions compared to last  time measured, though those measurements had gone up from the previous measurements. Daughter reported that finger wraps did better with the taping that was done to them at last session, so this was repeated. Reminded patient to move her arm at all  joints today.    Rehab Potential  Good    Clinical Impairments Affecting Rehab Potential  none    PT Frequency  3x / week    PT Duration  4 weeks    PT Treatment/Interventions  Compression bandaging;Manual lymph drainage;Manual techniques;Patient/family education;DME Instruction;Therapeutic exercise    PT Next Visit Plan  Renewal done this visit. Go over remedial exercises quickly.  Did they get an appt made for compression garments? Do we need to change any appts for her to get measured right after being wrapped?, continue CDT    PT Home Exercise Plan  compression bandaging and self MLD    Consulted and Agree with Plan of Care  Patient       Patient will benefit from skilled therapeutic intervention in order to improve the following deficits and impairments:  Decreased skin integrity, Increased edema  Visit Diagnosis: Postmastectomy lymphedema - Plan: PT plan of care cert/re-cert  Aftercare following surgery for neoplasm - Plan: PT plan of care cert/re-cert  Muscle weakness (generalized) - Plan: PT plan of care cert/re-cert  Abnormal posture - Plan: PT plan of care cert/re-cert     Problem List Patient Active Problem List   Diagnosis Date Noted  . 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 Preop cardiovascular exam 03/18/2017  . Essential hypertension 03/18/2017  . Dyslipidemia 03/18/2017  . Coronary artery disease involving native coronary artery of native heart without angina pectoris 03/18/2017  . Bruit 03/18/2017  . Lung metastases ([place]) 06/23/2016  . Malignant neoplasm of lower-inner quadrant of right breast of female, estrogen receptor negative ([place]) 01/22/2016    Marieli Rudy 01/18/2018, 5:19 PM  [place], [place], [address] Phone: [phone]   Fax:  [phone]  Name: Jennifer Garrett MRN: [id number] Date of Birth: January 15, 1932  Serafina Royals, PT 01/18/18 5:20 PM

## 2018-01-20 ENCOUNTER — Ambulatory Visit: Payer: Medicare Other | Admitting: Physical Therapy

## 2018-01-23 ENCOUNTER — Encounter: Payer: Self-pay | Admitting: Rehabilitation

## 2018-01-23 ENCOUNTER — Ambulatory Visit: Payer: Medicare Other | Admitting: Rehabilitation

## 2018-01-23 DIAGNOSIS — I972 Postmastectomy lymphedema syndrome: Secondary | ICD-10-CM | POA: Diagnosis not present

## 2018-01-23 DIAGNOSIS — Z483 Aftercare following surgery for neoplasm: Secondary | ICD-10-CM

## 2018-01-23 DIAGNOSIS — M6281 Muscle weakness (generalized): Secondary | ICD-10-CM

## 2018-01-23 DIAGNOSIS — R293 Abnormal posture: Secondary | ICD-10-CM

## 2018-01-23 NOTE — Therapy (Signed)
[place], [place], [address] Phone: [phone]   Fax:  [phone]  Physical Therapy Treatment  Patient Details  Name: Jennifer Garrett MRN: [id number] Date of Birth: 10/10/1931 Referring Provider: Sandi Mealy PA-C   Encounter Date: 01/23/2018  PT End of Session - 01/23/18 1600    Visit Number  11    Number of Visits  22    Date for PT Re-Evaluation  02/24/18    Authorization Type  Medicare    PT Start Time  1525    PT Stop Time  1600    PT Time Calculation (min)  35 min    Activity Tolerance  Patient tolerated treatment well    Behavior During Therapy  [facility] for tasks assessed/performed       Past Medical History:  Diagnosis Date  . Arthritis   . Asthma    rarely uses neb  . Breast cancer ([place]) 05/25/2016   right breast  . Breast cancer of lower-inner quadrant of right female breast ([place]) 01/22/2016  . Carpal tunnel syndrome   . Coronary artery disease    DES to OM3 90% stenosis 2003, 90% small RCA stenosis.    . Diabetes mellitus    not taken glipizide in over a year, checks surgars daily  . History of stomach ulcers   . Hyperlipemia   . Hypertension   . Irregular heart beat     Past Surgical History:  Procedure Laterality Date  . ABDOMINAL HYSTERECTOMY    . CARDIAC CATHETERIZATION  2003   stent-  . CARPAL TUNNEL RELEASE  03/29/2012   Procedure: CARPAL TUNNEL RELEASE;  Surgeon: Wynonia Sours, MD;  Location: [place];  Service: Orthopedics;  Laterality: Left;  . EYE SURGERY     catracts  . LESION EXCISION WITH COMPLEX REPAIR Right 05/25/2016   Procedure: complex repair of 25cm wound;  Surgeon: Irene Limbo, MD;  Location: [place];  Service: Plastics;  Laterality: Right;  . MASTECTOMY     right  . MASTECTOMY MODIFIED RADICAL Right 05/25/2016   Procedure: RIGHT MASTECTOMY MODIFIED RADICAL;  Surgeon: Fanny Skates, MD;  Location: [place];  Service: General;  Laterality:  Right;  . PUNCH BIOPSY OF SKIN Left 04/04/2017   Procedure: PUNCH BIOPSY OF LEFT BREAST SKIN;  Surgeon: Fanny Skates, MD;  Location: [place];  Service: General;  Laterality: Left;  . RADIOACTIVE SEED GUIDED AXILLARY SENTINEL LYMPH NODE Left 04/04/2017   Procedure: RADIOACTIVE SEED TARGETED LEFT AXILLARY LYMPH NODE EXCISION;  Surgeon: Fanny Skates, MD;  Location: [place];  Service: General;  Laterality: Left;  . SHOULDER ARTHROSCOPY     right and left  . TRIGGER FINGER RELEASE  03/29/2012   Procedure: RELEASE TRIGGER FINGER/A-1 PULLEY;  Surgeon: Wynonia Sours, MD;  Location: [place];  Service: Orthopedics;  Laterality: Left;    There were no vitals filed for this visit.  Subjective Assessment - 01/23/18 1523    Subjective  Had some increased swelling in the hand over the openings of the hand but was able to keep them on.  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  No/denies  Outpatient Re[facility]  Lymphedema Life Impact Scale Total Score  8.82 %           [facility] Adult PT Treatment/Exercise - 01/23/18 0001      Manual Therapy   Manual Lymphatic Drainage (MLD)  In supine: Short neck, 5 diaphragmatic breaths, Rt inguinal nodes, Rt axillo-inguinal anastomosis, and then Rt UE from lateral upper arm to dorsal hond working from proximal to distal then retracing all steps    Compression Bandaging  Lotion, thin stockinette, Elastomull to all fingers with paper tape to keep bandages in place (double at thumb and attached this to hand bandage), Artiflex x1, and 1-6, 1-10 and 1-12  cm short stretch compression bandages from hand to axilla.                  PT Long Term Goals - 01/18/18 1715      PT LONG TERM GOAL #1   Title  Pt will be knowledgeable about lymphedema risk reduction practices to decrease the occurrence or worsening of lymphedema    Baseline  Pt has memory deficits so have been reviewing with pt and daughter who is present at each appointment-12/22/17    Status  On-going      PT LONG TERM GOAL #2   Title  pt will obtain appropriate compression garments and be knowledgeable about their use    Baseline  Have been discussing process with pt and daughter, plan to go to [facility] and bill [facility]-12/22/17    Status  On-going      PT LONG TERM GOAL #3   Title  pt will decrease circumferential measurements at the ulnar styloid process from 28.7 to 20cm and 10cm proximal to the olecranon from 38.4 to 30.4    Baseline  Ulnar styloid 18 cm and 10 cm proximal to olecranon 36.4 cm - 12/22/17    Status  On-going      PT LONG TERM GOAL #4   Title  pt will be independent with remedial exercises to improve lymphatic flow    Baseline  Have begun instructing pt in remedial exercises for UE, espceially when bandaged, but needs further review-12/22/17    Status  On-going            Plan - 01/23/18 1600    Clinical Impression Statement  Pt continues with improved softness of the skin and Rt UE as well as the appearance of wrinkles in the hand instead of a fullness.  The garments have been staying on better with tighter compression and taping but she did report it was too tight one time.  Should continue with the 2 remaining appointments to get measured.  May need more until they arrive    PT Frequency  3x / week    PT Duration  4 weeks    PT Treatment/Interventions  Compression bandaging;Manual lymph drainage;Manual techniques;Patient/family education;DME Instruction;Therapeutic exercise    PT Next Visit Plan   Did they get an appt made for compression  garments? Do we need to change any appts for her to get measured right after being wrapped?, continue CDT       Patient will benefit from skilled therapeutic intervention in order to improve the following deficits and impairments:  Decreased skin integrity, Increased edema  Visit Diagnosis: Postmastectomy lymphedema  Aftercare following surgery for neoplasm  Muscle weakness (generalized)  Abnormal posture     Problem List Patient Active Problem List   Diagnosis Date  Noted  . 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 Preop cardiovascular exam 03/18/2017  . Essential hypertension 03/18/2017  . Dyslipidemia 03/18/2017  . Coronary artery disease involving native coronary artery of native heart without angina pectoris 03/18/2017  . Bruit 03/18/2017  . Lung metastases ([place]) 06/23/2016  . Malignant neoplasm of lower-inner quadrant of right breast of female, estrogen receptor negative ([place]) 01/22/2016   Shan Levans, PT 01/23/2018, 4:02 PM  [place], [place], [address] Phone: [phone]   Fax:  [phone]  Name: Jennifer Garrett MRN: [id number] Date of Birth: 05-Jan-1932

## 2018-01-25 ENCOUNTER — Ambulatory Visit: Payer: Medicare Other

## 2018-01-25 DIAGNOSIS — I972 Postmastectomy lymphedema syndrome: Secondary | ICD-10-CM

## 2018-01-25 DIAGNOSIS — Z483 Aftercare following surgery for neoplasm: Secondary | ICD-10-CM

## 2018-01-25 DIAGNOSIS — R293 Abnormal posture: Secondary | ICD-10-CM

## 2018-01-25 DIAGNOSIS — M6281 Muscle weakness (generalized): Secondary | ICD-10-CM

## 2018-01-25 NOTE — Therapy (Signed)
[place], [place], [address] Phone: [phone]   Fax:  [phone]  Physical Therapy Treatment  Patient Details  Name: Jennifer Garrett MRN: [id number] Date of Birth: 10-Jul-1932 Referring Provider: Sandi Mealy PA-C   Encounter Date: 01/25/2018  PT End of Session - 01/25/18 1718    Visit Number  12    Number of Visits  22    Date for PT Re-Evaluation  02/24/18    PT Start Time  2330    PT Stop Time  1602    PT Time Calculation (min)  41 min    Activity Tolerance  Patient tolerated treatment well    Behavior During Therapy  [facility] for tasks assessed/performed       Past Medical History:  Diagnosis Date  . Arthritis   . Asthma    rarely uses neb  . Breast cancer ([place]) 05/25/2016   right breast  . Breast cancer of lower-inner quadrant of right female breast ([place]) 01/22/2016  . Carpal tunnel syndrome   . Coronary artery disease    DES to OM3 90% stenosis 2003, 90% small RCA stenosis.    . Diabetes mellitus    not taken glipizide in over a year, checks surgars daily  . History of stomach ulcers   . Hyperlipemia   . Hypertension   . Irregular heart beat     Past Surgical History:  Procedure Laterality Date  . ABDOMINAL HYSTERECTOMY    . CARDIAC CATHETERIZATION  2003   stent-  . CARPAL TUNNEL RELEASE  03/29/2012   Procedure: CARPAL TUNNEL RELEASE;  Surgeon: Wynonia Sours, MD;  Location: [place];  Service: Orthopedics;  Laterality: Left;  . EYE SURGERY     catracts  . LESION EXCISION WITH COMPLEX REPAIR Right 05/25/2016   Procedure: complex repair of 25cm wound;  Surgeon: Irene Limbo, MD;  Location: [place];  Service: Plastics;  Laterality: Right;  . MASTECTOMY     right  . MASTECTOMY MODIFIED RADICAL Right 05/25/2016   Procedure: RIGHT MASTECTOMY MODIFIED RADICAL;  Surgeon: Fanny Skates, MD;  Location: [place];  Service: General;  Laterality: Right;  . PUNCH BIOPSY OF SKIN Left  04/04/2017   Procedure: PUNCH BIOPSY OF LEFT BREAST SKIN;  Surgeon: Fanny Skates, MD;  Location: [place];  Service: General;  Laterality: Left;  . RADIOACTIVE SEED GUIDED AXILLARY SENTINEL LYMPH NODE Left 04/04/2017   Procedure: RADIOACTIVE SEED TARGETED LEFT AXILLARY LYMPH NODE EXCISION;  Surgeon: Fanny Skates, MD;  Location: [place];  Service: General;  Laterality: Left;  . SHOULDER ARTHROSCOPY     right and left  . TRIGGER FINGER RELEASE  03/29/2012   Procedure: RELEASE TRIGGER FINGER/A-1 PULLEY;  Surgeon: Wynonia Sours, MD;  Location: [place];  Service: Orthopedics;  Laterality: Left;    There were no vitals filed for this visit.  Subjective Assessment - 01/25/18 1534    Subjective  Nothing new. Left my bandages on.    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  No/denies            LYMPHEDEMA/ONCOLOGY QUESTIONNAIRE - 01/25/18 1534      Right Upper Extremity Lymphedema   15 cm Proximal  to Olecranon Process  36.6 cm    10 cm Proximal to Olecranon Process  35.3 cm    Olecranon Process  28 cm    15 cm Proximal to Ulnar Styloid Process  26.9 cm    10 cm Proximal to Ulnar Styloid Process  23.9 cm    Just Proximal to Ulnar Styloid Process  16.9 cm    Across Hand at [employer]  18.4 cm    At [place] of 2nd Digit  6 cm    Other  length of arm 42 cm for sleeve           Outpatient Re[facility] from [facility]  Lymphedema Life Impact Scale Total Score  8.82 %           [facility] Adult PT Treatment/Exercise - 01/25/18 0001      Manual Therapy   Manual Lymphatic Drainage (MLD)  In supine: Short neck, 5 diaphragmatic breaths, Rt inguinal nodes,  Rt axillo-inguinal anastomosis, and then Rt UE from lateral upper arm to dorsal hond working from proximal to distal then retracing all steps    Compression Bandaging  Lotion, thin stockinette, Elastomull to all fingers with paper tape to keep bandages in place (double at thumb and attached this to hand bandage), Artiflex x1, and 1-6, 1-10 and 1-12 cm short stretch compression bandages from hand to axilla.                  PT Long Term Goals - 01/18/18 1715      PT LONG TERM GOAL #1   Title  Pt will be knowledgeable about lymphedema risk reduction practices to decrease the occurrence or worsening of lymphedema    Baseline  Pt has memory deficits so have been reviewing with pt and daughter who is present at each appointment-12/22/17    Status  On-going      PT LONG TERM GOAL #2   Title  pt will obtain appropriate compression garments and be knowledgeable about their use    Baseline  Have been discussing process with pt and daughter, plan to go to [facility] and bill [facility]-12/22/17    Status  On-going      PT LONG TERM GOAL #3   Title  pt will decrease circumferential measurements at the ulnar styloid process from 28.7 to 20cm and 10cm proximal to the olecranon from 38.4 to 30.4    Baseline  Ulnar styloid 18 cm and 10 cm proximal to olecranon 36.4 cm - 12/22/17    Status  On-going      PT LONG TERM GOAL #4   Title  pt will be independent with remedial exercises to improve lymphatic flow    Baseline  Have begun instructing pt in remedial exercises for UE, espceially when bandaged, but needs further review-12/22/17    Status  On-going            Plan - 01/25/18 1720    Clinical Impression Statement  Continued with complete decongestive therapy today and discussed with daughter scheduling more visits for next 2 weeks. Reached out to fitter today via email to [employer] through [place]. Also spoke with daughter regarding possibility of flat knit vs velcro compression and she  reports feeling like velcro compression will be easier to don for pt. Spoke with daughter about wrapping pt over weekend since as of now we only have 1 appt available for next week on Wednesday and she agreed.  Rehab Potential  Good    Clinical Impairments Affecting Rehab Potential  none    PT Frequency  3x / week    PT Duration  4 weeks    PT Treatment/Interventions  Compression bandaging;Manual lymph drainage;Manual techniques;Patient/family education;DME Instruction;Therapeutic exercise    PT Next Visit Plan  Have we heard back from [facility] and what are options for pt being measured? Continue CDT    Consulted and Agree with Plan of Care  Patient       Patient will benefit from skilled therapeutic intervention in order to improve the following deficits and impairments:  Decreased skin integrity, Increased edema  Visit Diagnosis: Postmastectomy lymphedema  Aftercare following surgery for neoplasm  Muscle weakness (generalized)  Abnormal posture     Problem List Patient Active Problem List   Diagnosis Date Noted  . 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 Preop cardiovascular exam 03/18/2017  . Essential hypertension 03/18/2017  . Dyslipidemia 03/18/2017  . Coronary artery disease involving native coronary artery of native heart without angina pectoris 03/18/2017  . Bruit 03/18/2017  . Lung metastases ([place]) 06/23/2016  . Malignant neoplasm of lower-inner quadrant of right breast of female, estrogen receptor negative ([place]) 01/22/2016    Otelia Limes, PTA 01/25/2018, 5:28 PM  [place], [place], [address] Phone: [phone]   Fax:  [phone]  Name: Jennifer Garrett MRN: [id number] Date of Birth: June 24, 1932

## 2018-01-27 ENCOUNTER — Ambulatory Visit: Payer: Medicare Other | Admitting: Physical Therapy

## 2018-01-27 ENCOUNTER — Encounter: Payer: Self-pay | Admitting: Physical Therapy

## 2018-01-27 DIAGNOSIS — I972 Postmastectomy lymphedema syndrome: Secondary | ICD-10-CM | POA: Diagnosis not present

## 2018-01-27 DIAGNOSIS — Z483 Aftercare following surgery for neoplasm: Secondary | ICD-10-CM

## 2018-01-27 NOTE — Therapy (Signed)
[place], [place], [address] Phone: [phone]   Fax:  [phone]  Physical Therapy Treatment  Patient Details  Name: Jennifer Garrett MRN: [id number] Date of Birth: 1931/10/29 Referring Provider: Sandi Mealy PA-C   Encounter Date: 01/27/2018  PT End of Session - 01/27/18 1150    Visit Number  13    Number of Visits  22    Date for PT Re-Evaluation  02/24/18    Authorization Type  Medicare    PT Start Time  1104    PT Stop Time  1148    PT Time Calculation (min)  44 min    Activity Tolerance  Patient tolerated treatment well    Behavior During Therapy  [facility] for tasks assessed/performed       Past Medical History:  Diagnosis Date  . Arthritis   . Asthma    rarely uses neb  . Breast cancer ([place]) 05/25/2016   right breast  . Breast cancer of lower-inner quadrant of right female breast ([place]) 01/22/2016  . Carpal tunnel syndrome   . Coronary artery disease    DES to OM3 90% stenosis 2003, 90% small RCA stenosis.    . Diabetes mellitus    not taken glipizide in over a year, checks surgars daily  . History of stomach ulcers   . Hyperlipemia   . Hypertension   . Irregular heart beat     Past Surgical History:  Procedure Laterality Date  . ABDOMINAL HYSTERECTOMY    . CARDIAC CATHETERIZATION  2003   stent-  . CARPAL TUNNEL RELEASE  03/29/2012   Procedure: CARPAL TUNNEL RELEASE;  Surgeon: Wynonia Sours, MD;  Location: [place];  Service: Orthopedics;  Laterality: Left;  . EYE SURGERY     catracts  . LESION EXCISION WITH COMPLEX REPAIR Right 05/25/2016   Procedure: complex repair of 25cm wound;  Surgeon: Irene Limbo, MD;  Location: [place];  Service: Plastics;  Laterality: Right;  . MASTECTOMY     right  . MASTECTOMY MODIFIED RADICAL Right 05/25/2016   Procedure: RIGHT MASTECTOMY MODIFIED RADICAL;  Surgeon: Fanny Skates, MD;  Location: [place];  Service: General;  Laterality:  Right;  . PUNCH BIOPSY OF SKIN Left 04/04/2017   Procedure: PUNCH BIOPSY OF LEFT BREAST SKIN;  Surgeon: Fanny Skates, MD;  Location: [place];  Service: General;  Laterality: Left;  . RADIOACTIVE SEED GUIDED AXILLARY SENTINEL LYMPH NODE Left 04/04/2017   Procedure: RADIOACTIVE SEED TARGETED LEFT AXILLARY LYMPH NODE EXCISION;  Surgeon: Fanny Skates, MD;  Location: [place];  Service: General;  Laterality: Left;  . SHOULDER ARTHROSCOPY     right and left  . TRIGGER FINGER RELEASE  03/29/2012   Procedure: RELEASE TRIGGER FINGER/A-1 PULLEY;  Surgeon: Wynonia Sours, MD;  Location: [place];  Service: Orthopedics;  Laterality: Left;    There were no vitals filed for this visit.  Subjective Assessment - 01/27/18 1106    Subjective  Left bandages on.     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  No/denies                  Outpatient [facility] from [facility]  Lymphedema Life Impact Scale Total Score  8.82 %           [facility] Adult PT Treatment/Exercise - 01/27/18 0001      Manual Therapy   Manual Lymphatic Drainage (MLD)  In supine: Short neck, 5 diaphragmatic breaths, Rt inguinal nodes, Rt axillo-inguinal anastomosis, and then Rt UE from lateral upper arm to dorsal hond working from proximal to distal then retracing all steps    Compression Bandaging  Lotion, thin stockinette, Elastomull to all fingers with paper tape to keep bandages in place (double at thumb and attached this to hand bandage), Artiflex x1, and 1-6, 1-10 and 1-12 cm short stretch compression bandages from hand to axilla.                  PT Long Term Goals - 01/18/18  1715      PT LONG TERM GOAL #1   Title  Pt will be knowledgeable about lymphedema risk reduction practices to decrease the occurrence or worsening of lymphedema    Baseline  Pt has memory deficits so have been reviewing with pt and daughter who is present at each appointment-12/22/17    Status  On-going      PT LONG TERM GOAL #2   Title  pt will obtain appropriate compression garments and be knowledgeable about their use    Baseline  Have been discussing process with pt and daughter, plan to go to [facility] and bill [facility]-12/22/17    Status  On-going      PT LONG TERM GOAL #3   Title  pt will decrease circumferential measurements at the ulnar styloid process from 28.7 to 20cm and 10cm proximal to the olecranon from 38.4 to 30.4    Baseline  Ulnar styloid 18 cm and 10 cm proximal to olecranon 36.4 cm - 12/22/17    Status  On-going      PT LONG TERM GOAL #4   Title  pt will be independent with remedial exercises to improve lymphatic flow    Baseline  Have begun instructing pt in remedial exercises for UE, espceially when bandaged, but needs further review-12/22/17    Status  On-going            Plan - 01/27/18 1151    Clinical Impression Statement  Continued with MLD and compression bandaging today. DIscussed with pt that a medicare fitter is planning on coming to the clinic in the next couple of weeks and she can be measured for compression garments at that time.     Rehab Potential  Good    Clinical Impairments Affecting Rehab Potential  none    PT Frequency  3x / week    PT Duration  4 weeks    PT Treatment/Interventions  Compression bandaging;Manual lymph drainage;Manual techniques;Patient/family education;DME Instruction;Therapeutic exercise    PT Next Visit Plan  Find out what day Melissa will be measuring and let pt know, continue complete CDT    PT Home Exercise Plan  compression bandaging and self MLD    Consulted and Agree with Plan of Care  Patient       Patient will  benefit from skilled therapeutic intervention in order to improve the following deficits and impairments:  Decreased skin integrity, Increased edema  Visit Diagnosis: Postmastectomy lymphedema  Aftercare following surgery for neoplasm     Problem List Patient Active Problem List   Diagnosis Date Noted  . 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 Preop  cardiovascular exam 03/18/2017  . Essential hypertension 03/18/2017  . Dyslipidemia 03/18/2017  . Coronary artery disease involving native coronary artery of native heart without angina pectoris 03/18/2017  . Bruit 03/18/2017  . Lung metastases ([place]) 06/23/2016  . Malignant neoplasm of lower-inner quadrant of right breast of female, estrogen receptor negative ([place]) 01/22/2016    Allyson Sabal [facility] 01/27/2018, 11:53 AM  [place], [place], [address] Phone: [phone]   Fax:  [phone]  Name: Jennifer Garrett MRN: [id number] Date of Birth: January 11, 1932  Manus Gunning, PT 01/27/18 11:53 AM

## 2018-02-01 ENCOUNTER — Ambulatory Visit: Payer: Medicare Other

## 2018-02-01 DIAGNOSIS — I972 Postmastectomy lymphedema syndrome: Secondary | ICD-10-CM | POA: Diagnosis not present

## 2018-02-01 DIAGNOSIS — Z483 Aftercare following surgery for neoplasm: Secondary | ICD-10-CM

## 2018-02-01 NOTE — Therapy (Signed)
[place], [place], [address] Phone: [phone]   Fax:  [phone]  Physical Therapy Treatment  Patient Details  Name: NESA DISTEL MRN: [id number] Date of Birth: November 15, 1931 Referring Provider: Sandi Mealy PA-C   Encounter Date: 02/01/2018  PT End of Session - 02/01/18 1704    Visit Number  14    Number of Visits  22    Date for PT Re-Evaluation  02/24/18    PT Start Time  1608    PT Stop Time  1649    PT Time Calculation (min)  41 min    Activity Tolerance  Patient tolerated treatment well    Behavior During Therapy  [facility] for tasks assessed/performed       Past Medical History:  Diagnosis Date  . Arthritis   . Asthma    rarely uses neb  . Breast cancer ([place]) 05/25/2016   right breast  . Breast cancer of lower-inner quadrant of right female breast ([place]) 01/22/2016  . Carpal tunnel syndrome   . Coronary artery disease    DES to OM3 90% stenosis 2003, 90% small RCA stenosis.    . Diabetes mellitus    not taken glipizide in over a year, checks surgars daily  . History of stomach ulcers   . Hyperlipemia   . Hypertension   . Irregular heart beat     Past Surgical History:  Procedure Laterality Date  . ABDOMINAL HYSTERECTOMY    . CARDIAC CATHETERIZATION  2003   stent-  . CARPAL TUNNEL RELEASE  03/29/2012   Procedure: CARPAL TUNNEL RELEASE;  Surgeon: Wynonia Sours, MD;  Location: [place];  Service: Orthopedics;  Laterality: Left;  . EYE SURGERY     catracts  . LESION EXCISION WITH COMPLEX REPAIR Right 05/25/2016   Procedure: complex repair of 25cm wound;  Surgeon: Irene Limbo, MD;  Location: [place];  Service: Plastics;  Laterality: Right;  . MASTECTOMY     right  . MASTECTOMY MODIFIED RADICAL Right 05/25/2016   Procedure: RIGHT MASTECTOMY MODIFIED RADICAL;  Surgeon: Fanny Skates, MD;  Location: [place];  Service: General;  Laterality: Right;  . PUNCH BIOPSY OF SKIN Left  04/04/2017   Procedure: PUNCH BIOPSY OF LEFT BREAST SKIN;  Surgeon: Fanny Skates, MD;  Location: [place];  Service: General;  Laterality: Left;  . RADIOACTIVE SEED GUIDED AXILLARY SENTINEL LYMPH NODE Left 04/04/2017   Procedure: RADIOACTIVE SEED TARGETED LEFT AXILLARY LYMPH NODE EXCISION;  Surgeon: Fanny Skates, MD;  Location: [place];  Service: General;  Laterality: Left;  . SHOULDER ARTHROSCOPY     right and left  . TRIGGER FINGER RELEASE  03/29/2012   Procedure: RELEASE TRIGGER FINGER/A-1 PULLEY;  Surgeon: Wynonia Sours, MD;  Location: [place];  Service: Orthopedics;  Laterality: Left;    There were no vitals filed for this visit.  Subjective Assessment - 02/01/18 1612    Subjective  The bandages fell off by the time we got home last time. Her fingers and hand didn't seem to be wrapped as good as usual.     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  No/denies  LYMPHEDEMA/ONCOLOGY QUESTIONNAIRE - 02/01/18 1614      Right Upper Extremity Lymphedema   15 cm Proximal to Olecranon Process  36.3 cm    10 cm Proximal to Olecranon Process  38.1 cm    Olecranon Process  29.4 cm    15 cm Proximal to Ulnar Styloid Process  28.6 cm    10 cm Proximal to Ulnar Styloid Process  24.8 cm    Just Proximal to Ulnar Styloid Process  18.9 cm    Across Hand at [employer]  19.4 cm    At [place] of 2nd Digit  6 cm           Outpatient Re[facility]  Lymphedema Life Impact Scale Total Score  8.82 %           [facility] Adult PT Treatment/Exercise - 02/01/18 0001      Manual Therapy   Edema Management  Discussed with pt and daughter  possibility of coming Monday to be measured by fitter for day and nighttime compression garments and they agreed. Pt and daughter are interested in flat knit option and velcro for night.     Manual Lymphatic Drainage (MLD)  In supine: Short neck, 5 diaphragmatic breaths, Rt inguinal nodes, Rt axillo-inguinal anastomosis, and then Rt UE from lateral upper arm to dorsal hond working from proximal to distal then retracing all steps    Compression Bandaging  Lotion, thin stockinette, Elastomull to all fingers with paper tape to keep bandages in place (double at thumb and attached this to hand bandage), Artiflex x1, and 1-6, 1-10 and 1-12 cm short stretch compression bandages from hand to axilla.                  PT Long Term Goals - 01/18/18 1715      PT LONG TERM GOAL #1   Title  Pt will be knowledgeable about lymphedema risk reduction practices to decrease the occurrence or worsening of lymphedema    Baseline  Pt has memory deficits so have been reviewing with pt and daughter who is present at each appointment-12/22/17    Status  On-going      PT LONG TERM GOAL #2   Title  pt will obtain appropriate compression garments and be knowledgeable about their use    Baseline  Have been discussing process with pt and daughter, plan to go to [facility] and bill [facility]-12/22/17    Status  On-going      PT LONG TERM GOAL #3   Title  pt will decrease circumferential measurements at the ulnar styloid process from 28.7 to 20cm and 10cm proximal to the olecranon from 38.4 to 30.4    Baseline  Ulnar styloid 18 cm and 10 cm proximal to olecranon 36.4 cm - 12/22/17    Status  On-going      PT LONG TERM GOAL #4   Title  pt will be independent with remedial exercises to improve lymphatic flow    Baseline  Have begun instructing pt in remedial exercises for UE, espceially when bandaged, but needs further review-12/22/17    Status  On-going            Plan - 02/01/18 1704    Clinical Impression  Statement  Pts circumference mesurements were increased today but she hasn't been in compression since Friday evening when pts daughter reports bandages were sliding off. Daughter did not try to wrap her moms arm reporting she  didn't have time this weekend. As of now we do not have an appointment available for Friday, so encouraged pt to wrap her moms arm over weekend as todays bandage won't stay on but for a few days and if we are going to have her measured for a flat knit compression garment Monday, then we need her arm as small as possible by then and not being in compression all weekend will not be good for this. She verbalized understanding and reports she will do it if no Friday appts open up.     Rehab Potential  Good    Clinical Impairments Affecting Rehab Potential  none    PT Frequency  3x / week    PT Duration  4 weeks    PT Treatment/Interventions  Compression bandaging;Manual lymph drainage;Manual techniques;Patient/family education;DME Instruction;Therapeutic exercise    PT Next Visit Plan  Continue complete CDT to Rt UE; pt to be measured for all compression garments Monday afternoon.    Consulted and Agree with Plan of Care  Patient       Patient will benefit from skilled therapeutic intervention in order to improve the following deficits and impairments:  Decreased skin integrity, Increased edema  Visit Diagnosis: Postmastectomy lymphedema  Aftercare following surgery for neoplasm     Problem List Patient Active Problem List   Diagnosis Date Noted  . 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 Preop cardiovascular exam 03/18/2017  . Essential hypertension 03/18/2017  . Dyslipidemia 03/18/2017  . Coronary artery disease involving native coronary artery of native heart without angina pectoris 03/18/2017  . Bruit 03/18/2017  . Lung metastases ([place]) 06/23/2016  . Malignant neoplasm of lower-inner quadrant of right breast of female, estrogen receptor  negative ([place]) 01/22/2016    Otelia Limes, PTA 02/01/2018, 5:12 PM  [place], [place], [address] Phone: [phone]   Fax:  [phone]  Name: CYRENE GHARIBIAN MRN: [id number] Date of Birth: 1931/08/10

## 2018-02-07 ENCOUNTER — Ambulatory Visit: Payer: Medicare Other

## 2018-02-07 DIAGNOSIS — I972 Postmastectomy lymphedema syndrome: Secondary | ICD-10-CM

## 2018-02-07 DIAGNOSIS — Z483 Aftercare following surgery for neoplasm: Secondary | ICD-10-CM

## 2018-02-07 NOTE — Therapy (Signed)
[place], [place], [address] Phone: [phone]   Fax:  [phone]  Physical Therapy Treatment  Patient Details  Name: Jennifer Garrett MRN: [id number] Date of Birth: 05-26-1932 Referring Provider: Sandi Mealy PA-C   Encounter Date: 02/07/2018  PT End of Session - 02/07/18 1604    Visit Number  15    Number of Visits  22    Date for PT Re-Evaluation  02/24/18    PT Start Time  1522    PT Stop Time  1603    PT Time Calculation (min)  41 min    Activity Tolerance  Patient tolerated treatment well    Behavior During Therapy  [facility] for tasks assessed/performed       Past Medical History:  Diagnosis Date  . Arthritis   . Asthma    rarely uses neb  . Breast cancer ([place]) 05/25/2016   right breast  . Breast cancer of lower-inner quadrant of right female breast ([place]) 01/22/2016  . Carpal tunnel syndrome   . Coronary artery disease    DES to OM3 90% stenosis 2003, 90% small RCA stenosis.    . Diabetes mellitus    not taken glipizide in over a year, checks surgars daily  . History of stomach ulcers   . Hyperlipemia   . Hypertension   . Irregular heart beat     Past Surgical History:  Procedure Laterality Date  . ABDOMINAL HYSTERECTOMY    . CARDIAC CATHETERIZATION  2003   stent-  . CARPAL TUNNEL RELEASE  03/29/2012   Procedure: CARPAL TUNNEL RELEASE;  Surgeon: Wynonia Sours, MD;  Location: [place];  Service: Orthopedics;  Laterality: Left;  . EYE SURGERY     catracts  . LESION EXCISION WITH COMPLEX REPAIR Right 05/25/2016   Procedure: complex repair of 25cm wound;  Surgeon: Irene Limbo, MD;  Location: [place];  Service: Plastics;  Laterality: Right;  . MASTECTOMY     right  . MASTECTOMY MODIFIED RADICAL Right 05/25/2016   Procedure: RIGHT MASTECTOMY MODIFIED RADICAL;  Surgeon: Fanny Skates, MD;  Location: [place];  Service: General;  Laterality: Right;  . PUNCH BIOPSY OF SKIN Left  04/04/2017   Procedure: PUNCH BIOPSY OF LEFT BREAST SKIN;  Surgeon: Fanny Skates, MD;  Location: [place];  Service: General;  Laterality: Left;  . RADIOACTIVE SEED GUIDED AXILLARY SENTINEL LYMPH NODE Left 04/04/2017   Procedure: RADIOACTIVE SEED TARGETED LEFT AXILLARY LYMPH NODE EXCISION;  Surgeon: Fanny Skates, MD;  Location: [place];  Service: General;  Laterality: Left;  . SHOULDER ARTHROSCOPY     right and left  . TRIGGER FINGER RELEASE  03/29/2012   Procedure: RELEASE TRIGGER FINGER/A-1 PULLEY;  Surgeon: Wynonia Sours, MD;  Location: [place];  Service: Orthopedics;  Laterality: Left;    There were no vitals filed for this visit.  Subjective Assessment - 02/07/18 1526    Subjective  The bandages stayed on until Friday and wasn't rewrapped this weekend by daughter. Got measured for compression garments yesterday and they said it could be up to 2 months.     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  No/denies  Outpatient Rehab from [facility]06/[facility]  Lymphedema Life Impact Scale Total Score  8.82 %           [facility] Adult PT Treatment/Exercise - 02/07/18 0001      Manual Therapy   Manual Lymphatic Drainage (MLD)  In supine: Short neck, 5 diaphragmatic breaths, Rt inguinal nodes, Rt axillo-inguinal anastomosis, and then Rt UE from lateral upper arm to dorsal hond working from proximal to distal then retracing all steps    Compression Bandaging  Lotion, thin stockinette, Elastomull to all fingers with paper tape to keep bandages in place (double at thumb and attached this to hand bandage), Artiflex x1, and 1-6, 1-10 and 1-12 cm  short stretch compression bandages from hand to axilla.Pts daughter recorded with her phone therapist performing this so she can try once the weekend arrives. She is more motivated to learn this since it could be 2 months until garment arrives so she won't have to keep bringing her mom to appts for that long.                  PT Long Term Goals - 01/18/18 1715      PT LONG TERM GOAL #1   Title  Pt will be knowledgeable about lymphedema risk reduction practices to decrease the occurrence or worsening of lymphedema    Baseline  Pt has memory deficits so have been reviewing with pt and daughter who is present at each appointment-12/22/17    Status  On-going      PT LONG TERM GOAL #2   Title  pt will obtain appropriate compression garments and be knowledgeable about their use    Baseline  Have been discussing process with pt and daughter, plan to go to [facility] and bill [facility]-12/22/17    Status  On-going      PT LONG TERM GOAL #3   Title  pt will decrease circumferential measurements at the ulnar styloid process from 28.7 to 20cm and 10cm proximal to the olecranon from 38.4 to 30.4    Baseline  Ulnar styloid 18 cm and 10 cm proximal to olecranon 36.4 cm - 12/22/17    Status  On-going      PT LONG TERM GOAL #4   Title  pt will be independent with remedial exercises to improve lymphatic flow    Baseline  Have begun instructing pt in remedial exercises for UE, espceially when bandaged, but needs further review-12/22/17    Status  On-going            Plan - 02/07/18 1604    Clinical Impression Statement  Pt was measured for compression garments yesterday and was told it could be up to 2 months before they arrive. So daughter is more motivated to learn to wrap her mom now so she won't have to bring her to appts for 2 more months. She recorded therapist performing this today and hopes to try this at home. Pt did visibly have increased swelling today as she hasn't been wrapped since  Friday.     Rehab Potential  Good    Clinical Impairments Affecting Rehab Potential  none    PT Frequency  3x / week    PT Duration  4 weeks    PT Treatment/Interventions  Compression bandaging;Manual lymph drainage;Manual techniques;Patient/family education;DME Instruction;Therapeutic exercise    PT Next Visit Plan  Continue complete CDT to Rt UE until garments arrive or daughter feels independent to cont bandaging at home.  Consulted and Agree with Plan of Care  Patient       Patient will benefit from skilled therapeutic intervention in order to improve the following deficits and impairments:  Decreased skin integrity, Increased edema  Visit Diagnosis: Postmastectomy lymphedema  Aftercare following surgery for neoplasm     Problem List Patient Active Problem List   Diagnosis Date Noted  . 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 Preop cardiovascular exam 03/18/2017  . Essential hypertension 03/18/2017  . Dyslipidemia 03/18/2017  . Coronary artery disease involving native coronary artery of native heart without angina pectoris 03/18/2017  . Bruit 03/18/2017  . Lung metastases ([place]) 06/23/2016  . Malignant neoplasm of lower-inner quadrant of right breast of female, estrogen receptor negative ([place]) 01/22/2016    Otelia Limes, PTA 02/07/2018, 4:07 PM  [place], [place], [address] Phone: [phone]   Fax:  [phone]  Name: Jennifer Garrett MRN: [id number] Date of Birth: 1932-03-01

## 2018-02-09 ENCOUNTER — Encounter: Payer: Self-pay | Admitting: Rehabilitation

## 2018-02-09 ENCOUNTER — Ambulatory Visit: Payer: Medicare Other | Admitting: Rehabilitation

## 2018-02-09 DIAGNOSIS — Z483 Aftercare following surgery for neoplasm: Secondary | ICD-10-CM

## 2018-02-09 DIAGNOSIS — I972 Postmastectomy lymphedema syndrome: Secondary | ICD-10-CM | POA: Diagnosis not present

## 2018-02-09 NOTE — Therapy (Addendum)
[place], [place], [address] Phone: [phone]   Fax:  [phone]  Physical Therapy Treatment  Patient Details  Name: Jennifer Garrett MRN: [id number] Date of Birth: 21-Aug-1931 Referring Provider: Sandi Mealy PA-C   Encounter Date: 02/09/2018  PT End of Session - 02/09/18 1455    Visit Number  16    Number of Visits  22    Date for PT Re-Evaluation  02/24/18    Authorization Type  Medicare    PT Start Time  1430    PT Stop Time  1515    PT Time Calculation (min)  45 min    Activity Tolerance  Patient tolerated treatment well    Behavior During Therapy  [facility] for tasks assessed/performed       Past Medical History:  Diagnosis Date  . Arthritis   . Asthma    rarely uses neb  . Breast cancer ([place]) 05/25/2016   right breast  . Breast cancer of lower-inner quadrant of right female breast ([place]) 01/22/2016  . Carpal tunnel syndrome   . Coronary artery disease    DES to OM3 90% stenosis 2003, 90% small RCA stenosis.    . Diabetes mellitus    not taken glipizide in over a year, checks surgars daily  . History of stomach ulcers   . Hyperlipemia   . Hypertension   . Irregular heart beat     Past Surgical History:  Procedure Laterality Date  . ABDOMINAL HYSTERECTOMY    . CARDIAC CATHETERIZATION  2003   stent-  . CARPAL TUNNEL RELEASE  03/29/2012   Procedure: CARPAL TUNNEL RELEASE;  Surgeon: Wynonia Sours, MD;  Location: [place];  Service: Orthopedics;  Laterality: Left;  . EYE SURGERY     catracts  . LESION EXCISION WITH COMPLEX REPAIR Right 05/25/2016   Procedure: complex repair of 25cm wound;  Surgeon: Irene Limbo, MD;  Location: [place];  Service: Plastics;  Laterality: Right;  . MASTECTOMY     right  . MASTECTOMY MODIFIED RADICAL Right 05/25/2016   Procedure: RIGHT MASTECTOMY MODIFIED RADICAL;  Surgeon: Fanny Skates, MD;  Location: [place];  Service: General;  Laterality:  Right;  . PUNCH BIOPSY OF SKIN Left 04/04/2017   Procedure: PUNCH BIOPSY OF LEFT BREAST SKIN;  Surgeon: Fanny Skates, MD;  Location: [place];  Service: General;  Laterality: Left;  . RADIOACTIVE SEED GUIDED AXILLARY SENTINEL LYMPH NODE Left 04/04/2017   Procedure: RADIOACTIVE SEED TARGETED LEFT AXILLARY LYMPH NODE EXCISION;  Surgeon: Fanny Skates, MD;  Location: [place];  Service: General;  Laterality: Left;  . SHOULDER ARTHROSCOPY     right and left  . TRIGGER FINGER RELEASE  03/29/2012   Procedure: RELEASE TRIGGER FINGER/A-1 PULLEY;  Surgeon: Wynonia Sours, MD;  Location: [place];  Service: Orthopedics;  Laterality: Left;    There were no vitals filed for this visit.  Subjective Assessment - 02/09/18 1429    Subjective  nothing new to report.  Comes in bandaged.  Friend not daughter present today.      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  No/denies  Outpatient Re[facility] from [facility]  Lymphedema Life Impact Scale Total Score  8.82 %           [facility] Adult PT Treatment/Exercise - 02/09/18 0001      Manual Therapy   Manual Lymphatic Drainage (MLD)  In supine: Short neck, 5 diaphragmatic breaths, Rt inguinal nodes, Rt axillo-inguinal anastomosis, and then Rt UE from lateral upper arm to dorsal hond working from proximal to distal then retracing all steps    Compression Bandaging  Lotion, thin stockinette, Elastomull to all fingers with paper tape to keep bandages in place, Artiflex x1, and 1-6, 1-10 and 1-12 cm short stretch compression bandages from hand to axilla                  PT Long Term Goals -  01/18/18 1715      PT LONG TERM GOAL #1   Title  Pt will be knowledgeable about lymphedema risk reduction practices to decrease the occurrence or worsening of lymphedema    Baseline  Pt has memory deficits so have been reviewing with pt and daughter who is present at each appointment-12/22/17    Status  On-going      PT LONG TERM GOAL #2   Title  pt will obtain appropriate compression garments and be knowledgeable about their use    Baseline  Have been discussing process with pt and daughter, plan to go to [facility] and bill [facility]-12/22/17    Status  On-going      PT LONG TERM GOAL #3   Title  pt will decrease circumferential measurements at the ulnar styloid process from 28.7 to 20cm and 10cm proximal to the olecranon from 38.4 to 30.4    Baseline  Ulnar styloid 18 cm and 10 cm proximal to olecranon 36.4 cm - 12/22/17    Status  On-going      PT LONG TERM GOAL #4   Title  pt will be independent with remedial exercises to improve lymphatic flow    Baseline  Have begun instructing pt in remedial exercises for UE, espceially when bandaged, but needs further review-12/22/17    Status  On-going            Plan - 02/09/18 2130    Clinical Impression Statement  Pt tolerated MLD and bandaging well again today.  She had one firm spot in the distal forearm but improved post MLD.  She was able to keep the bandages on since last visit so she remained reduced well.      PT Frequency  3x / week    PT Duration  4 weeks    PT Treatment/Interventions  Compression bandaging;Manual lymph drainage;Manual techniques;Patient/family education;DME Instruction;Therapeutic exercise    PT Next Visit Plan  Continue complete CDT to Rt UE until garments arrive or daughter feels independent to cont bandaging at home. (Pts daughter has handouts and video for bandaging but does not complete it)       Patient will benefit from skilled therapeutic intervention in order to improve the following deficits and  impairments:  Decreased skin integrity, Increased edema  Visit Diagnosis: Postmastectomy lymphedema  Aftercare following surgery for neoplasm     Problem List Patient Active Problem List   Diagnosis Date Noted  . 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 Preop cardiovascular exam 03/18/2017  . Essential hypertension 03/18/2017  . Dyslipidemia 03/18/2017  . Coronary artery disease involving native coronary artery of native heart without  angina pectoris 03/18/2017  . Bruit 03/18/2017  . Lung metastases ([place]) 06/23/2016  . Malignant neoplasm of lower-inner quadrant of right breast of female, estrogen receptor negative ([place]) 01/22/2016    Shan Levans, PT 02/09/2018, 9:33 PM  [place], [place], [address] Phone: [phone]   Fax:  [phone]  Name: Jennifer Garrett MRN: [id number] Date of Birth: September 30, 1931   PHYSICAL THERAPY DISCHARGE SUMMARY  Visits from Start of Care: 16  Current functional level related to goals / functional outcomes: Pt was awaiting garments on last visit and has not returned since then.    Remaining deficits: Continued chronic UE lymphedema    Education / Equipment: Self MLD/bandaging, home garments  Plan: Patient agrees to discharge.  Patient goals were partially met. Patient is being discharged due to not returning since the last visit.  ?????     Shan Levans, PT 05/29/18

## 2018-02-16 ENCOUNTER — Ambulatory Visit: Payer: Medicare Other | Admitting: Physical Therapy

## 2018-02-21 ENCOUNTER — Encounter: Payer: Medicare Other | Admitting: Physical Therapy

## 2018-02-23 ENCOUNTER — Encounter: Payer: Medicare Other | Admitting: Physical Therapy

## 2018-02-27 IMAGING — MG 2D DIGITAL DIAGNOSTIC BILATERAL MAMMOGRAM WITH CAD AND ADJUNCT T
8 of 14 series · 8 of 30 positions shown · non-contrast
Comparison: Prior exam dated 04/25/2008.

CLINICAL DATA: 84-year-old female with a palpable, ulcerated
bleeding mass in the 5 o'clock region of the right breast. Patient
had been treated with a course of antibiotics for possible infection
without resolution.

EXAM:
2D DIGITAL DIAGNOSTIC BILATERAL MAMMOGRAM WITH CAD AND ADJUNCT TOMO
ULTRASOUND RIGHT BREAST

[R MLO (1 of 2)]
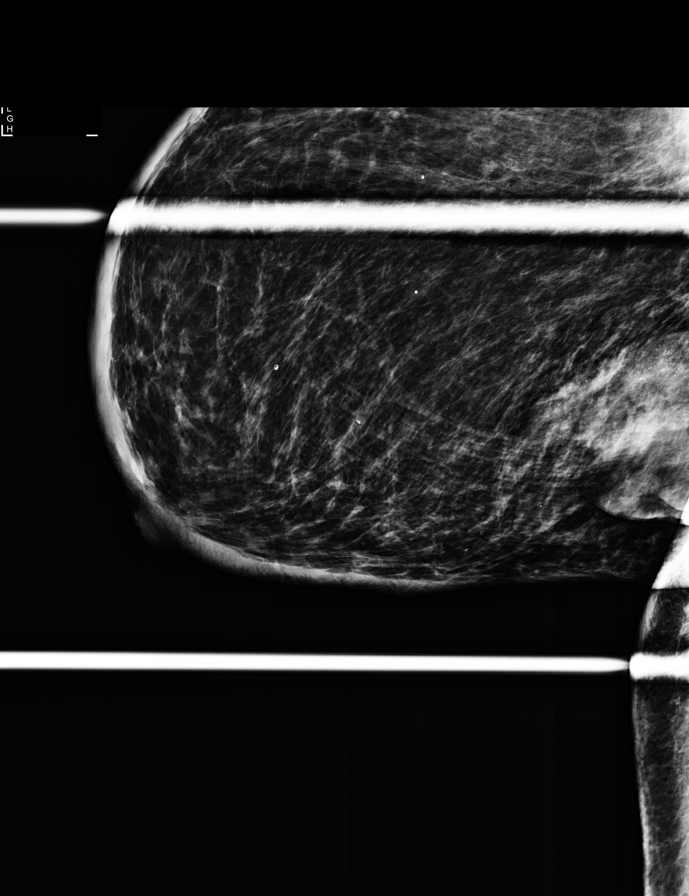

[L MLO (1 of 2)]
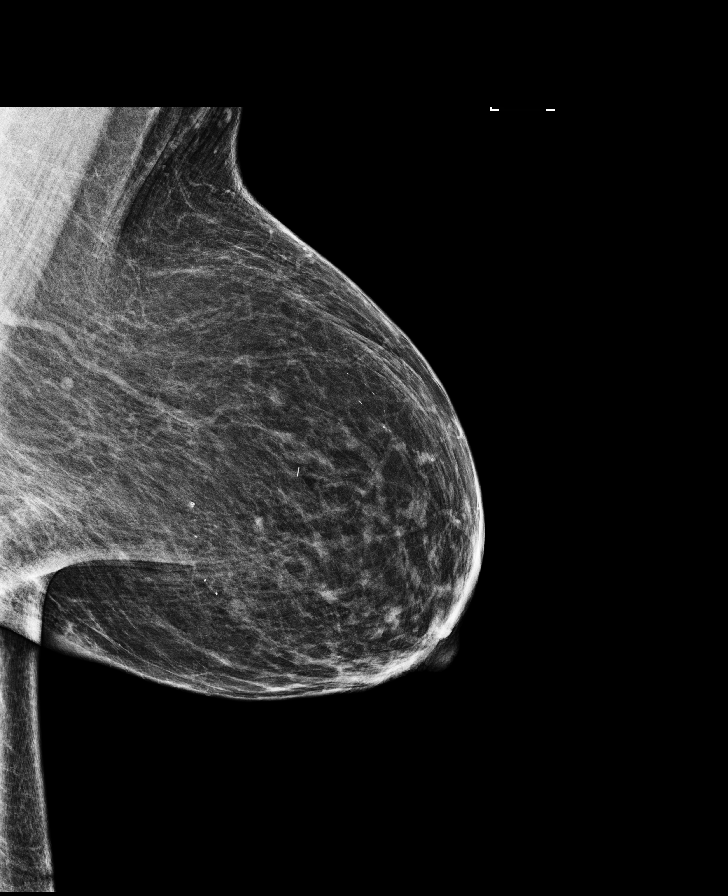

[R MLO (2 of 2)]
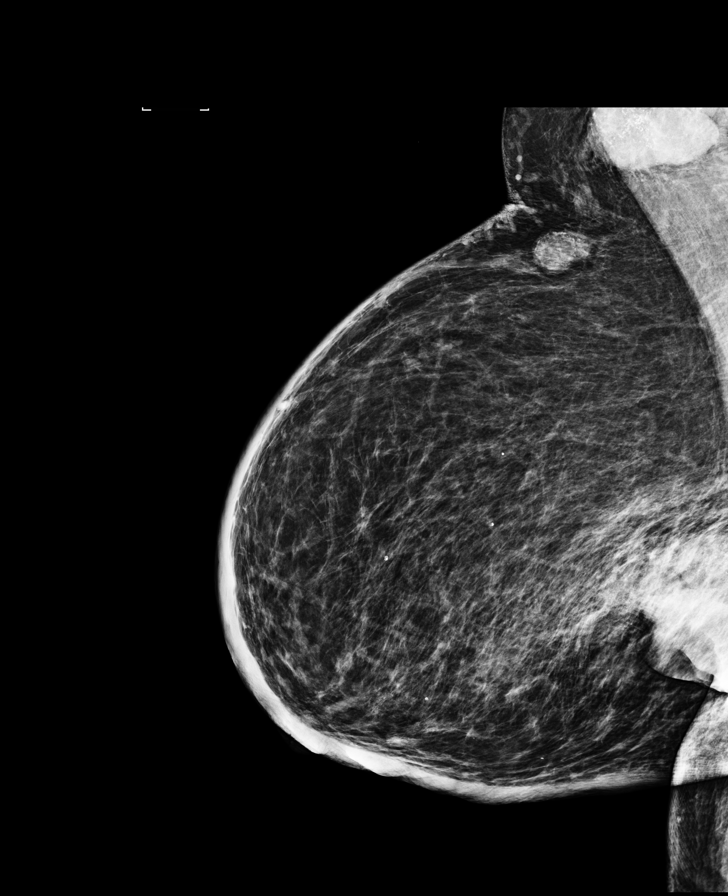

[L CC synth-2D]
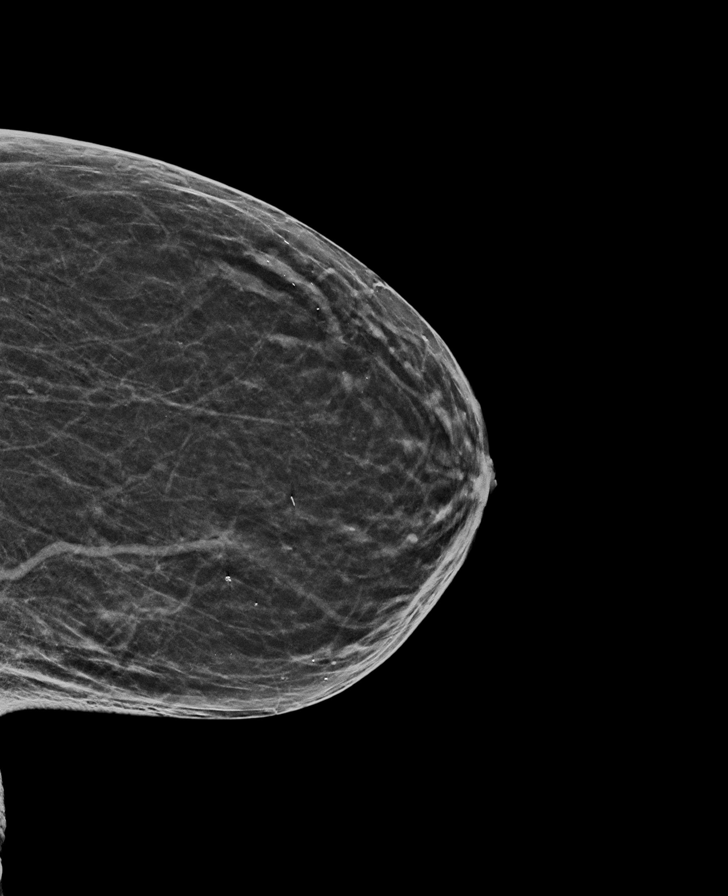

[L CC]
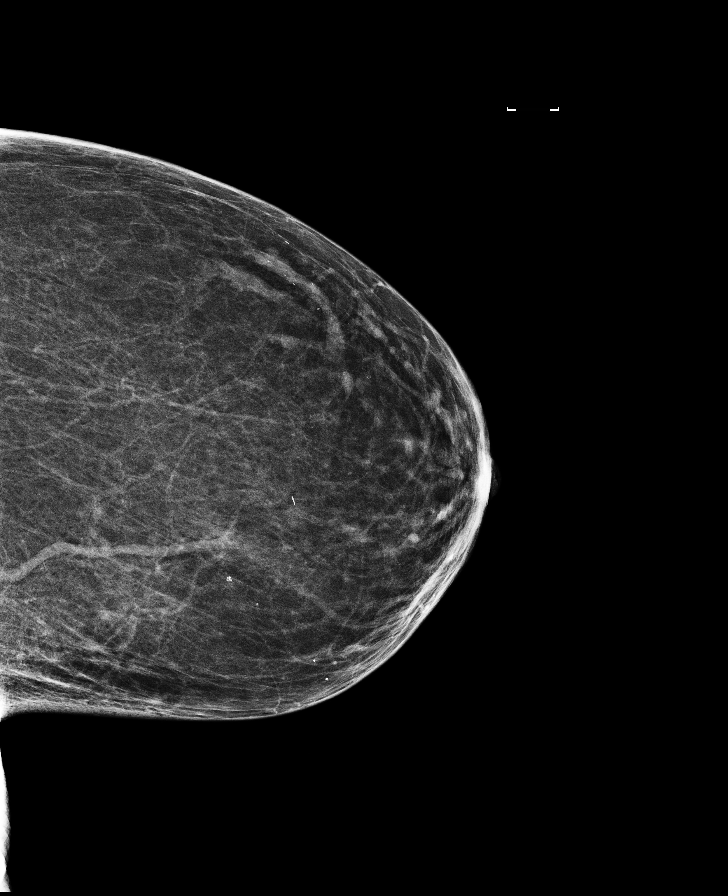

[L MLO (2 of 2)]
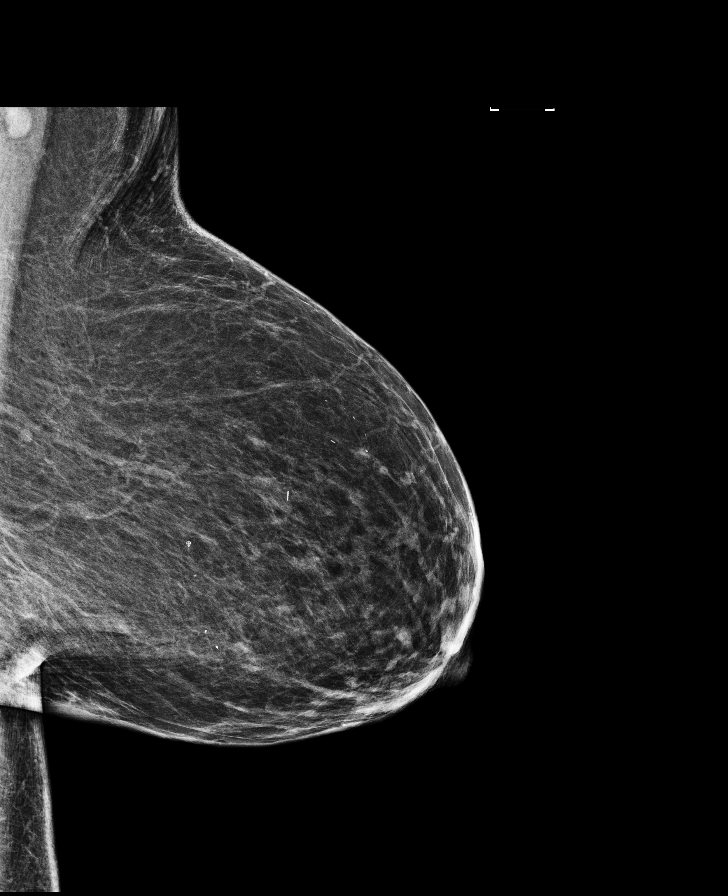

[L MLO synth-2D]
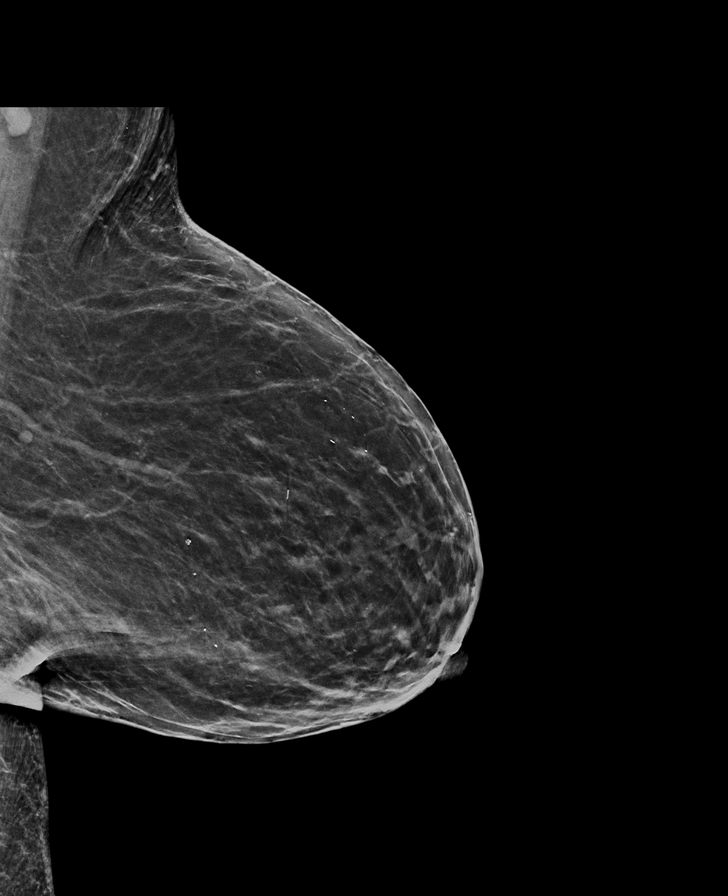

[R CC synth-2D]
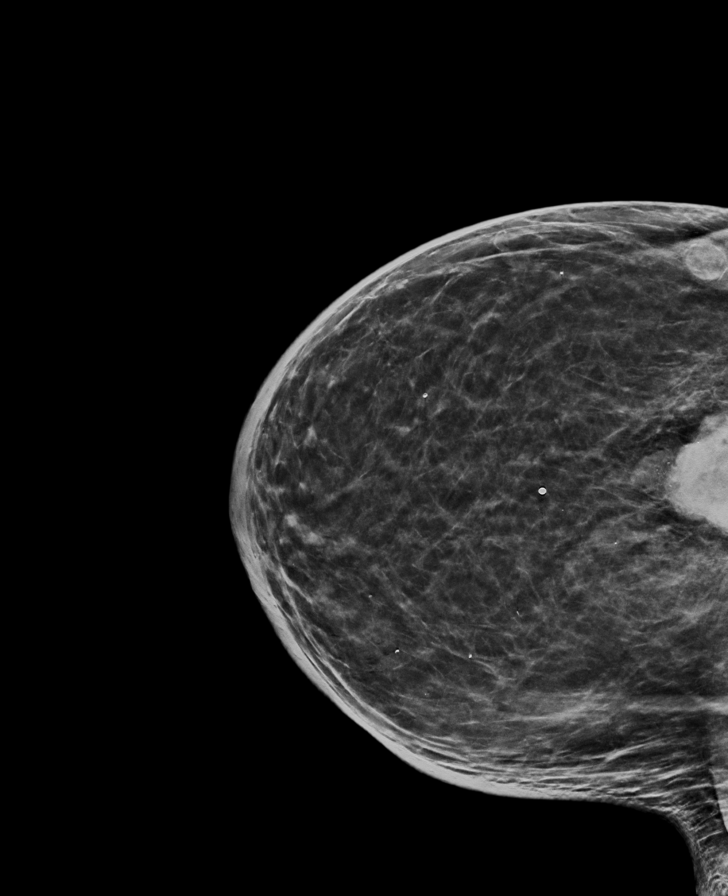

[8 of 30 positions shown; findings below may reference images not displayed]

ACR Breast Density Category b: There are scattered areas of
fibroglandular density.
FINDINGS: There is diffuse skin thickening as well as trabecular thickening of
the right breast. Far posteriorly in the 5 o'clock region of the
right breast is a 4.1 cm mass. The posterior aspect of the mass is
not visualized on the imaging. There are associated fine pleomorphic
calcifications. There is enlarged axillary adenopathy associated
with malignant type microcalcifications.

The largest nodal mass measures 3.4 cm.

Mammographic images were processed with CAD.

On physical exam, there is a fungating, ulcerated bleeding mass in
the right breast at 5 o'clock 7 cm from the nipple. Fullness is felt
in the right axilla.

Targeted ultrasound is performed, showing an irregular hypoechoic
mass in the right breast at 5 o'clock 7 cm from the nipple measuring
5.4 x 3.9 x 4.1 cm. Sonographic evaluation of the right axilla shows
3 nodal masses. The largest nodal mass measures 3.6 x 1.8 x 3.7 cm.
IMPRESSION: Imaging findings are concerning for invasive mammary carcinoma with
axillary metastasis.

RECOMMENDATION:
Ultrasound-guided core biopsies of the right breast mass is well is
axillary adenopathy is recommended. This has been scheduled on
01/06/2016.

I have discussed the findings and recommendations with the patient.
Results were also provided in writing at the conclusion of the
visit. If applicable, a reminder letter will be sent to the patient
regarding the next appointment.

BI-RADS CATEGORY  5: Highly suggestive of malignancy.

## 2018-02-28 ENCOUNTER — Ambulatory Visit: Payer: Medicare Other | Attending: Medical

## 2018-02-28 IMAGING — US US BREAST BX W LOC DEV 1ST LESION IMG BX SPEC US GUIDE*R*
1 series · 13 of 25 positions shown · non-contrast
Comparison: Previous exam(s).

ADDENDUM:
Pathology revealed grade III invasive ductal carcinoma and ductal
carcinoma in situ in the right breast. The right axillary lymph node
is positive for ductal carcinoma and may represent a node completely
replaced by tumor. This was found to be concordant by Dr. Faulder
Mariejan. Pathology results were discussed with the patient's
Jim, Lorraine, by telephone. The patient did well after the
biopsy. Post biopsy instructions and care were reviewed and
questions were answered. The patient was encouraged to call The
Surgical consultation has been arranged with Dr. Mma K Iseng at
[REDACTED] on January 12, 2016.

Pathology results reported by Viry Dolce RN, BSN on 01/08/2016.
CLINICAL DATA: Patient presents for ultrasound-guided biopsy of an
ulcerated large lower outer quadrant right breast mass and enlarged
right axillary lymph nodes.
EXAM:
ULTRASOUND GUIDED RIGHT BREAST AND RIGHT AXILLA CORE NEEDLE BIOPSY

[Series 1: us breast bx w loc dev 1st lesion img bx spec us g · 0.07mm/px · 13 of 26 slices shown]
[im 1/26]
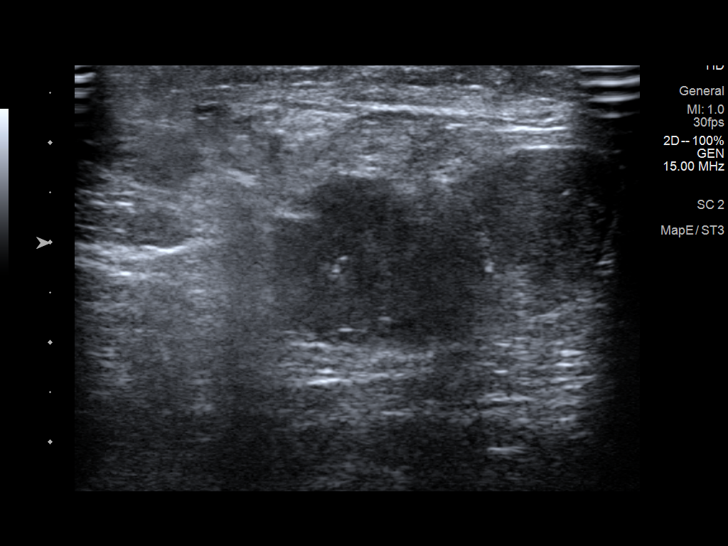
[im 3/26]
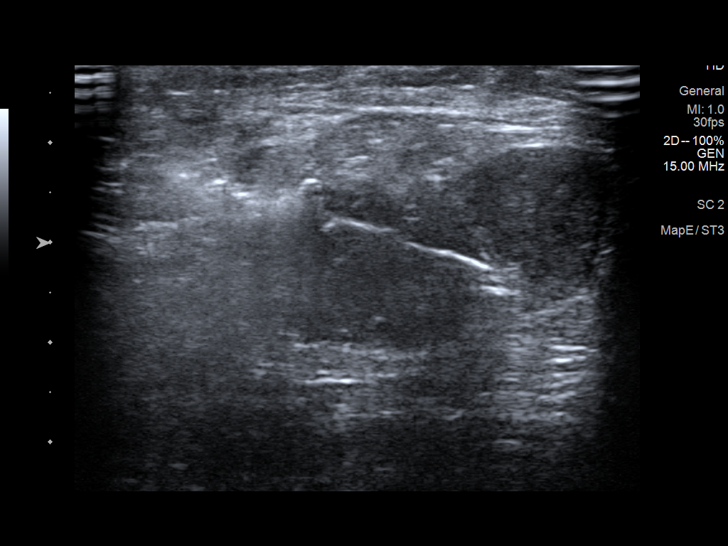
[im 5/26]
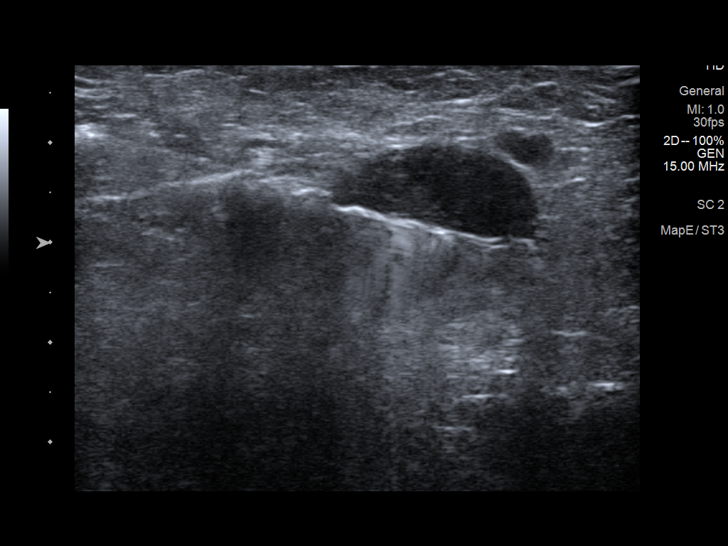
[im 7/26]
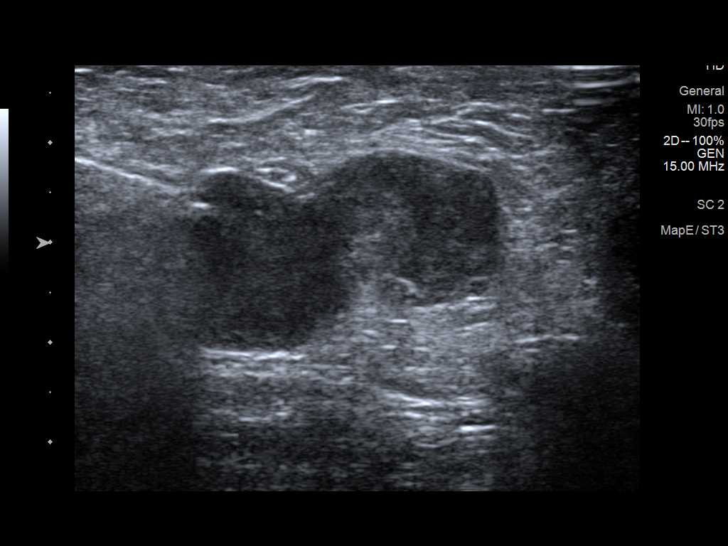
[im 9/26]
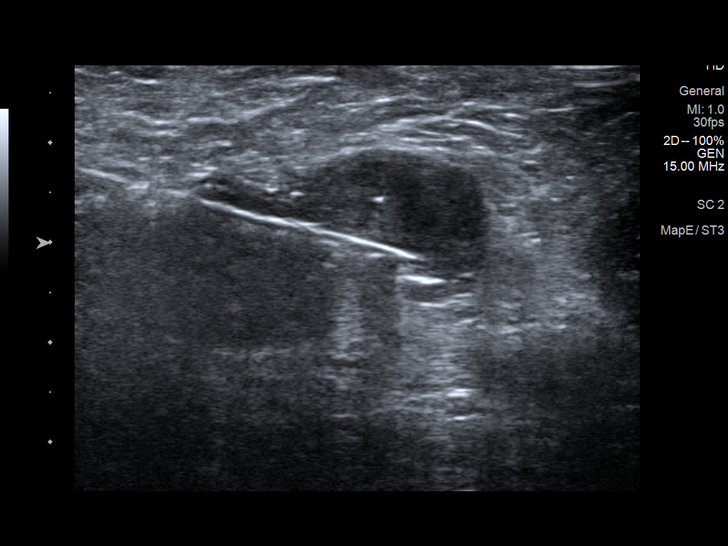
[im 11/26]
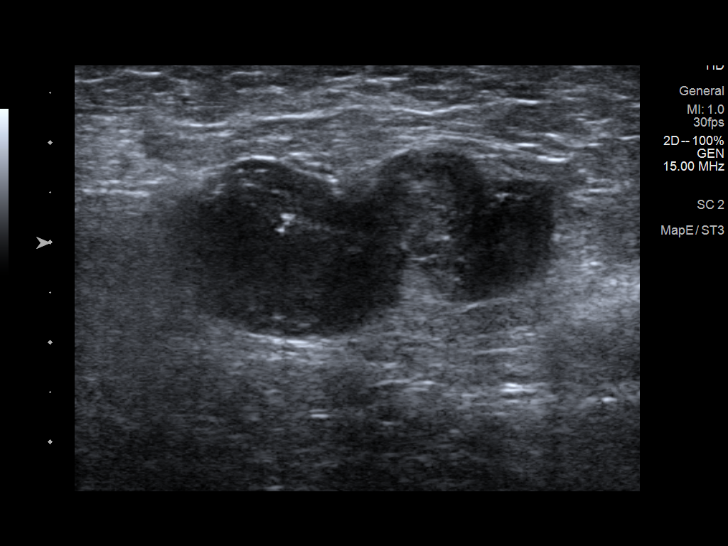
[im 13/26]
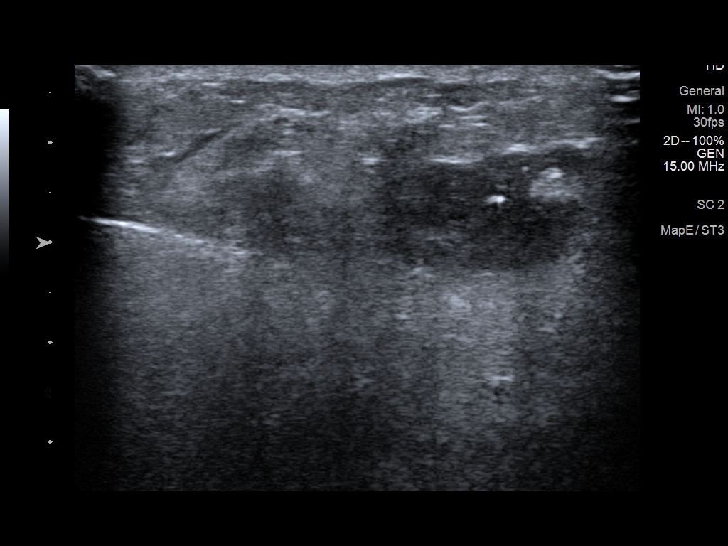
[im 15/26]
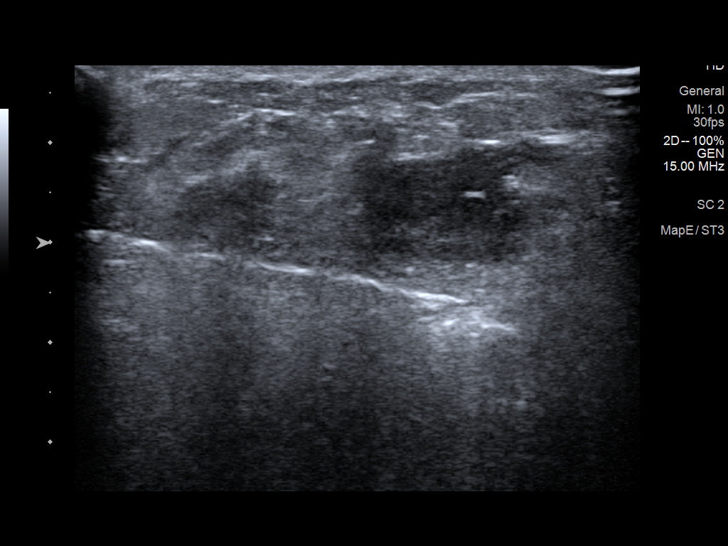
[im 17/26]
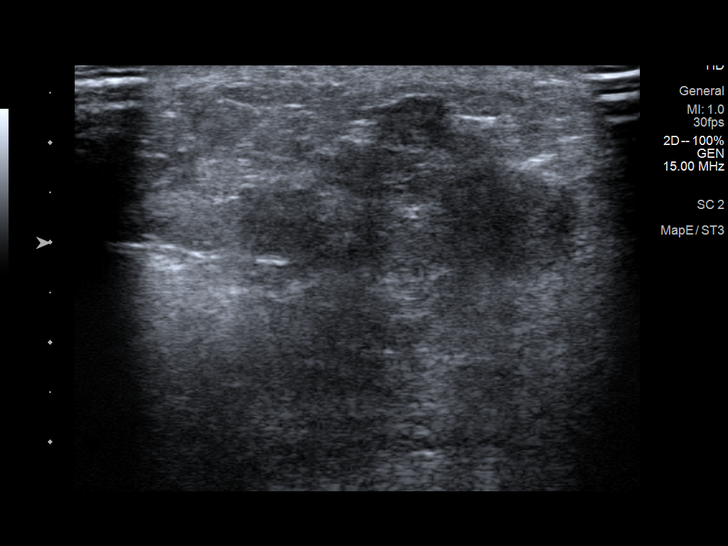
[im 19/26]
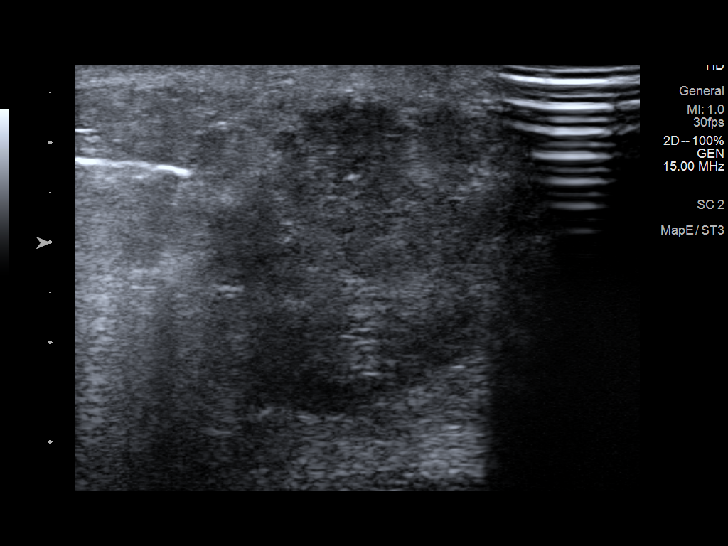
[im 21/26]
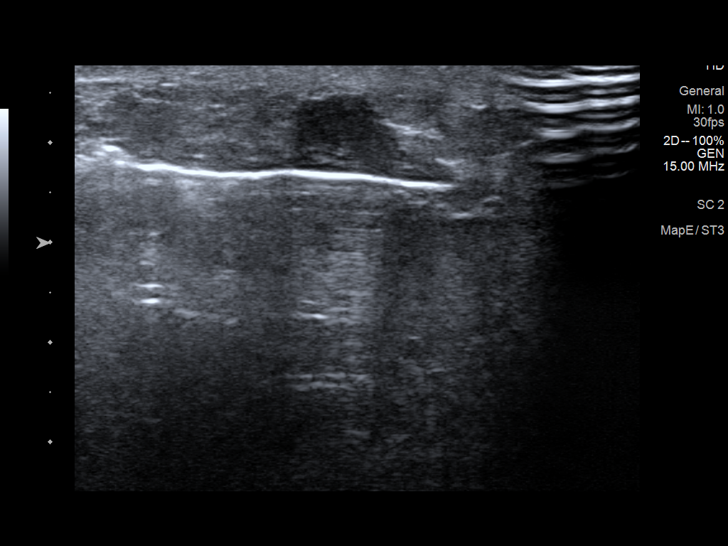
[im 23/26]
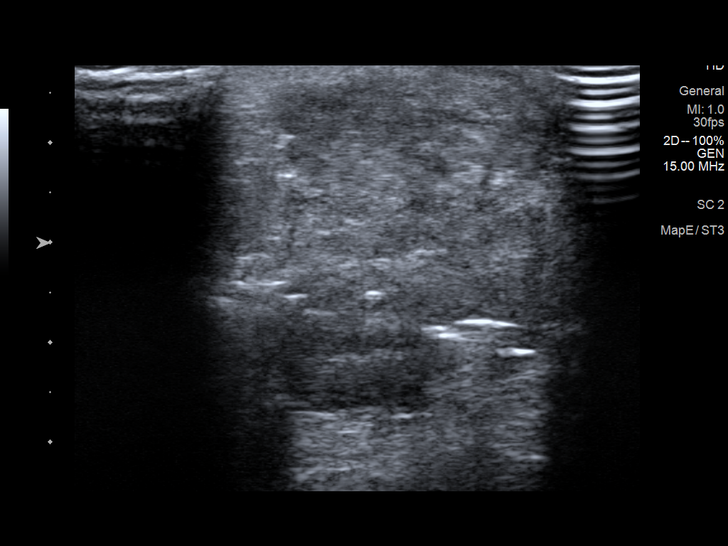
[im 26/26]
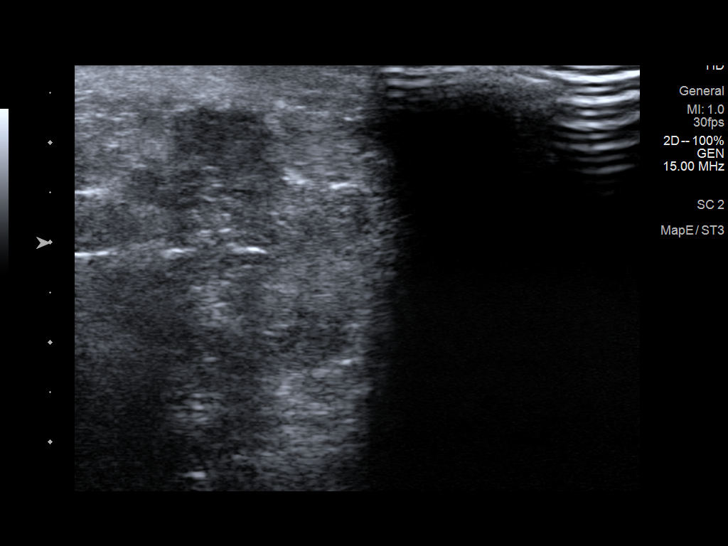

[13 of 25 positions shown; findings below may reference images not displayed]



Using sterile technique and 1% Lidocaine as local anesthetic, under
direct ultrasound visualization, a 14 gauge Kirtoakeh device was
used to perform biopsy of the abnormally enlarged right axillary
lymph node using a lateral, inferior approach. At the conclusion of
the procedure a HydroMARK tissue marker clip was deployed into the
biopsy cavity.

Using sterile technique and 1% Lidocaine as local anesthetic, under
direct ultrasound visualization, a 12 gauge Kirtoakeh device was
used to perform biopsy of lower outer quadrant right breast mass
using a lateral approach. At the conclusion of the procedure a
ribbon shaped tissue marker clip was deployed into the biopsy
cavity. Follow up 2 view mammogram was performed and dictated
separately.
IMPRESSION: Ultrasound guided biopsy of a right lower outer quadrant breast mass
and an abnormally enlarged right axillary lymph node. No apparent
complications.

## 2018-02-28 IMAGING — MG DIGITAL DIAGNOSTIC UNILATERAL RIGHT MAMMOGRAM
2 series · 2 of 2 positions shown · non-contrast
Comparison: Previous exam(s).

CLINICAL DATA: Clip imaging following ultrasound-guided core needle
biopsy of a right breast mass and right axillary lymph node.

EXAM:
DIAGNOSTIC RIGHT MAMMOGRAM POST ULTRASOUND BIOPSY

[R ML]
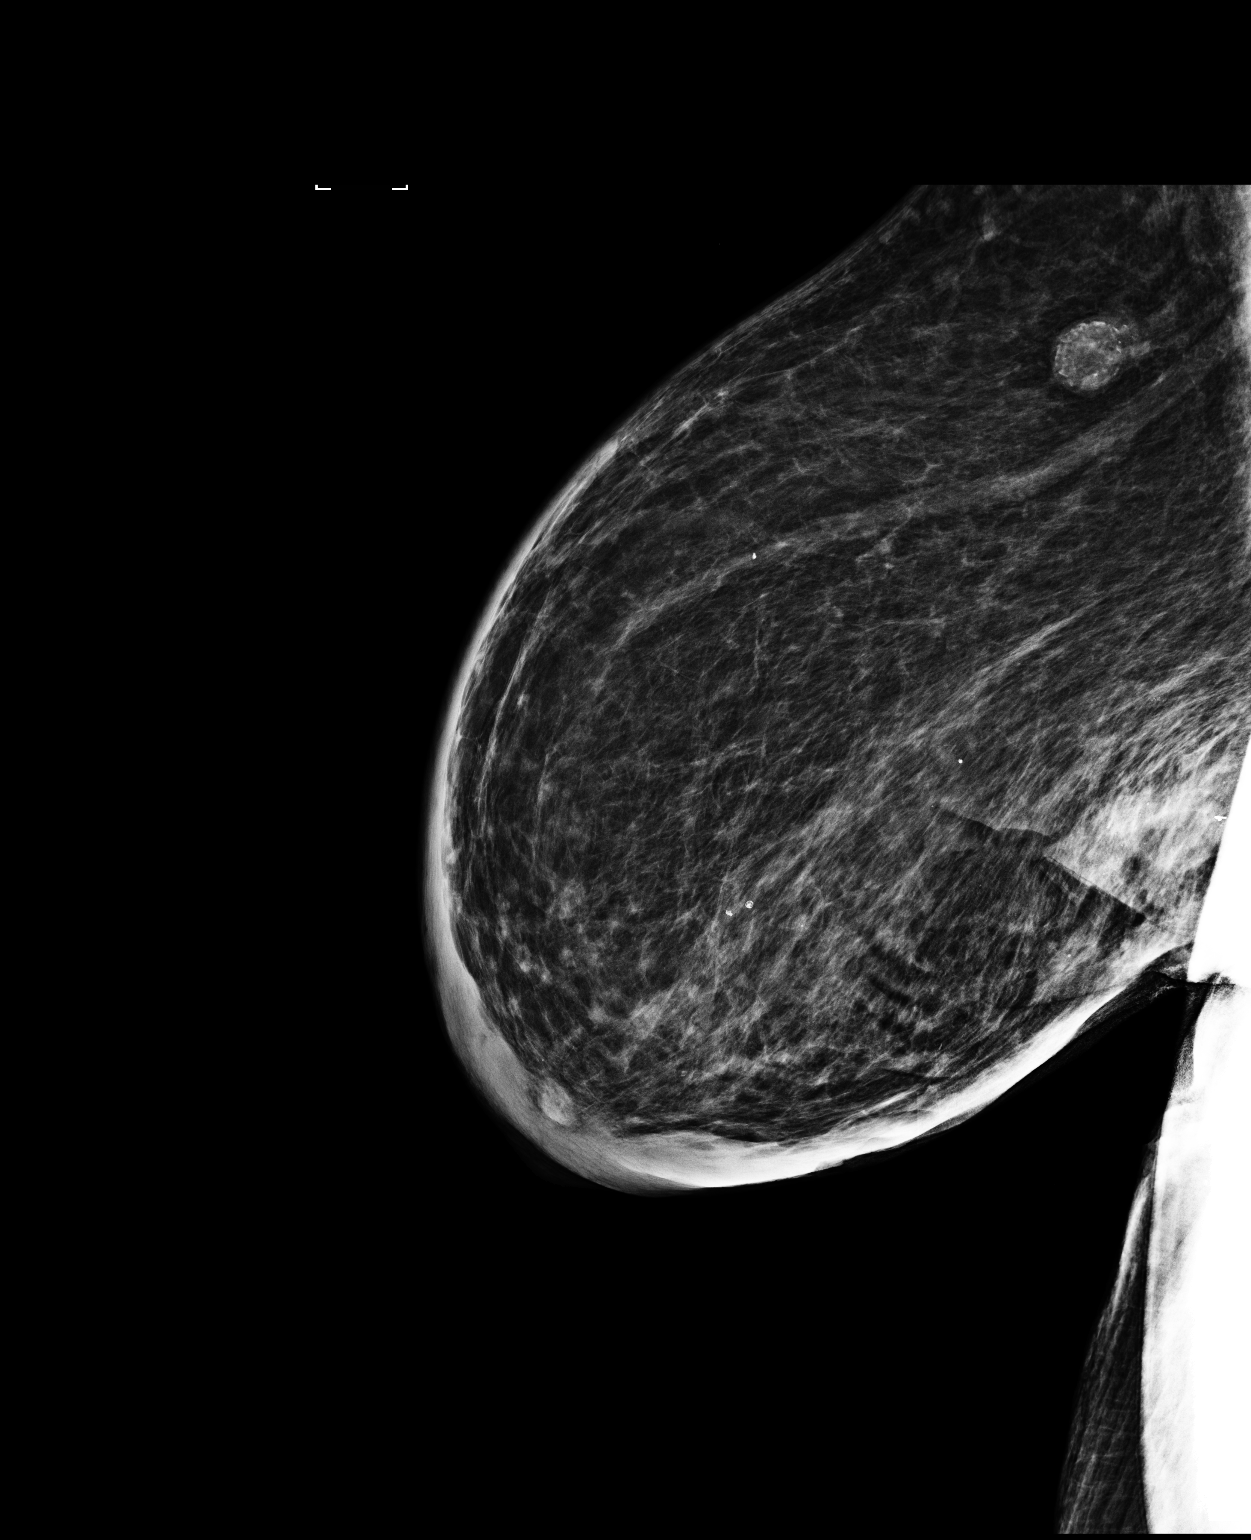

[R CC]
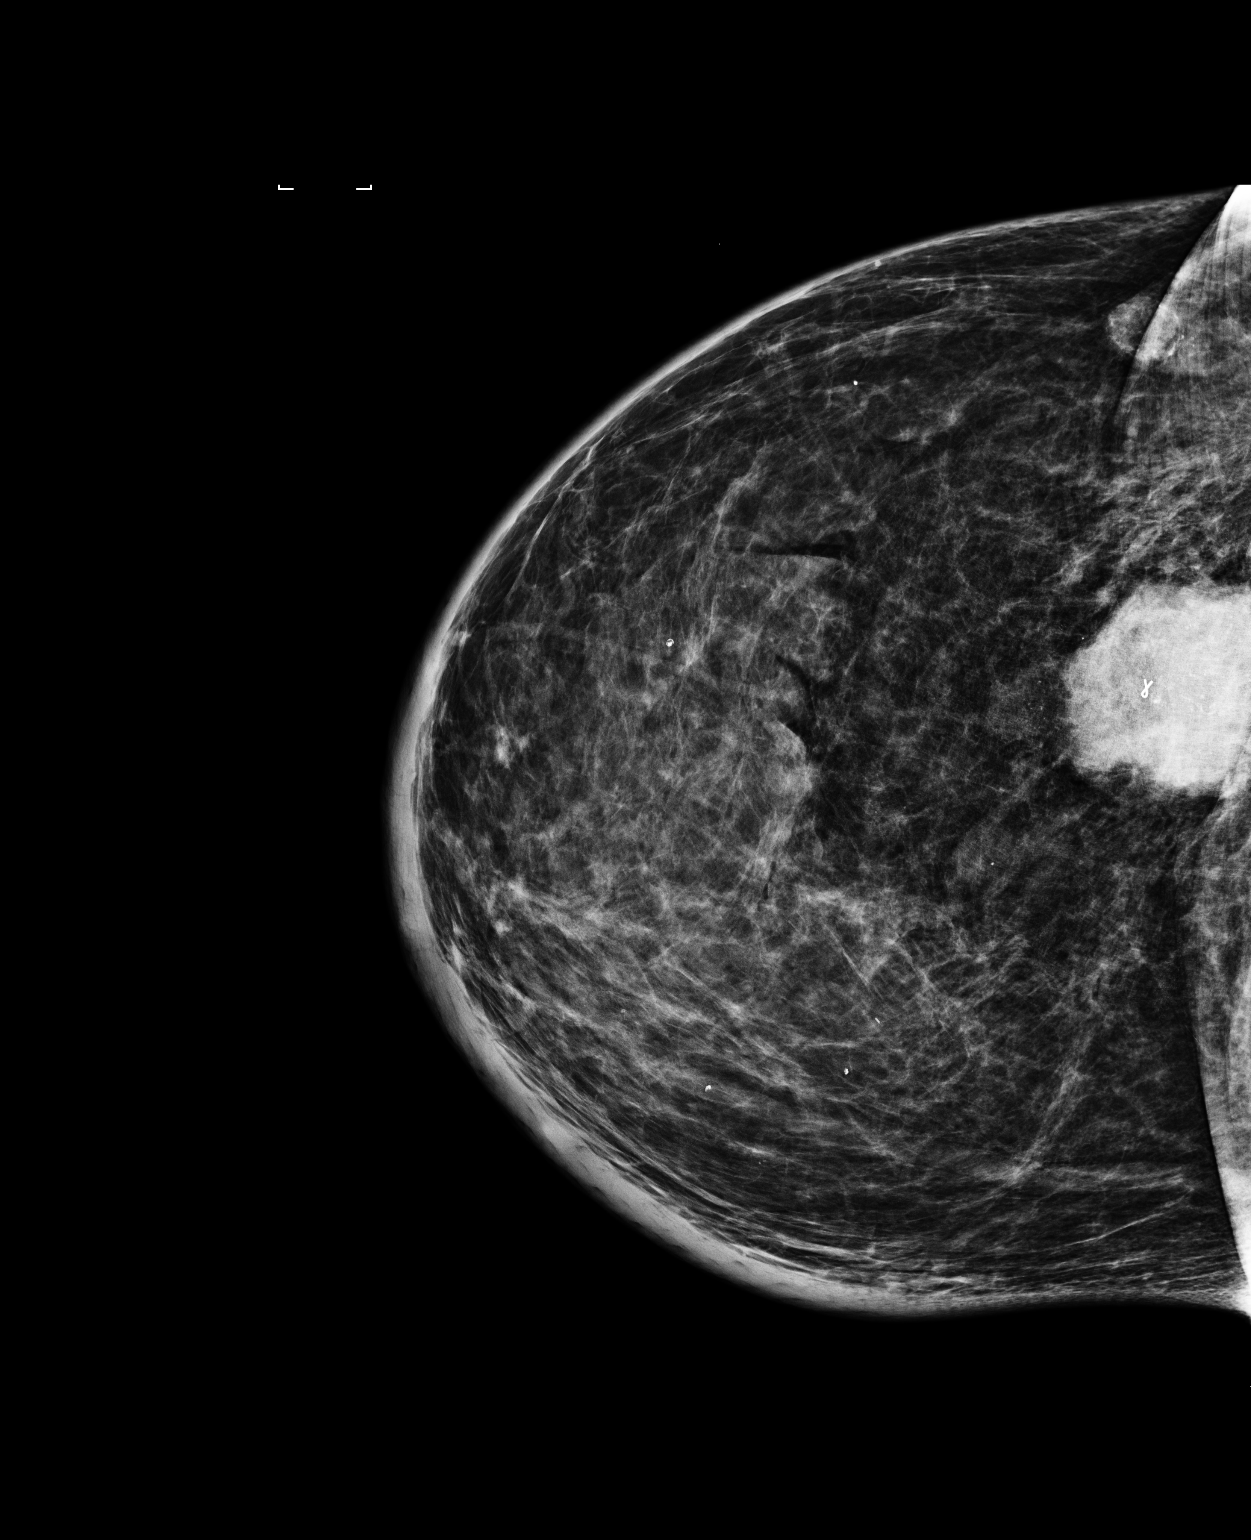

[2 of 2 positions shown; findings below may reference images not displayed]

FINDINGS: Mammographic images were obtained following ultrasound guided biopsy
of a large lower outer quadrant right breast mass as well as an
enlarged right axillary lymph node. The images show the ribbon
shaped biopsy clip within the large lower outer quadrant mass. The
HydroMARK clip was visualized sonographically within the axillary
mass/enlarged lymph nodes.
IMPRESSION: Well-positioned ribbon shaped biopsy clip following
ultrasound-guided core needle biopsy of a large lower outer quadrant
right breast mass.

Final Assessment: Post Procedure Mammograms for Marker Placement

## 2018-03-02 ENCOUNTER — Encounter: Payer: Medicare Other | Admitting: Physical Therapy

## 2018-03-15 ENCOUNTER — Telehealth: Payer: Self-pay | Admitting: *Deleted

## 2018-03-15 NOTE — Telephone Encounter (Signed)
ROI faxed to [facility] - att: Colbert Coyer; release [phone]

## 2018-03-22 ENCOUNTER — Ambulatory Visit (INDEPENDENT_AMBULATORY_CARE_PROVIDER_SITE_OTHER): Payer: Medicare Other | Admitting: Podiatry

## 2018-03-22 ENCOUNTER — Encounter: Payer: Self-pay | Admitting: Podiatry

## 2018-03-22 DIAGNOSIS — M79609 Pain in unspecified limb: Secondary | ICD-10-CM

## 2018-03-22 DIAGNOSIS — E1140 Type 2 diabetes mellitus with diabetic neuropathy, unspecified: Secondary | ICD-10-CM

## 2018-03-22 DIAGNOSIS — M2010 Hallux valgus (acquired), unspecified foot: Secondary | ICD-10-CM | POA: Diagnosis not present

## 2018-03-22 DIAGNOSIS — B351 Tinea unguium: Secondary | ICD-10-CM

## 2018-03-22 NOTE — Progress Notes (Signed)
Patient ID: RAMISA DUMAN, female   DOB: 1931-09-03, 84 y.o.   MRN: [id number] Complaint:  Visit Type: Patient returns to my office for continued preventative foot care services. Complaint: Patient states" my nails have grown long and thick and become painful to walk and wear shoes" Patient has been diagnosed with DM with neuropathy.. The patient presents for preventative foot care services. No changes to ROS  Podiatric Exam: Vascular: dorsalis pedis and posterior tibial pulses are palpable bilateral. Capillary return is immediate. Temperature gradient is WNL. Skin turgor WNL  Sensorium: Decreased  Semmes Weinstein monofilament test. Normal tactile sensation bilaterally. Nail Exam: Pt has thick disfigured discolored nails with subungual debris noted bilateral entire nail hallux through fifth toenails Ulcer Exam: There is no evidence of ulcer or pre-ulcerative changes or infection. Orthopedic Exam: Muscle tone and strength are WNL. No limitations in general ROM. No crepitus or effusions noted. HAV  B/L. Skin: No Porokeratosis. No infection or ulcers  Diagnosis:  Onychomycosis, ,Diabetes with neuropathy.  HAV  B/L.  Treatment & Plan Procedures and Treatment: Consent by patient was obtained for treatment procedures. The patient understood the discussion of treatment and procedures well. All questions were answered thoroughly reviewed. Debridement of mycotic and hypertrophic toenails, 1 through 5 bilateral and clearing of subungual debris. No ulceration, no infection noted. . ABN signed for 2019. Return Visit-Office Procedure: Patient instructed to return to the office for a follow up visit 3 months for continued evaluation and treatment.    Gardiner Barefoot DPM

## 2018-05-03 ENCOUNTER — Ambulatory Visit (HOSPITAL_COMMUNITY)
Admission: RE | Admit: 2018-05-03 | Discharge: 2018-05-03 | Disposition: A | Discharge status: Home / Self Care | Payer: Medicare Other | Source: Ambulatory Visit | Attending: Oncology | Admitting: Oncology

## 2018-05-03 DIAGNOSIS — C7800 Secondary malignant neoplasm of unspecified lung: Secondary | ICD-10-CM

## 2018-05-03 DIAGNOSIS — C773 Secondary and unspecified malignant neoplasm of axilla and upper limb lymph nodes: Secondary | ICD-10-CM

## 2018-05-03 DIAGNOSIS — Z171 Estrogen receptor negative status [ER-]: Secondary | ICD-10-CM | POA: Diagnosis not present

## 2018-05-03 DIAGNOSIS — J432 Centrilobular emphysema: Secondary | ICD-10-CM | POA: Insufficient documentation

## 2018-05-03 DIAGNOSIS — C50311 Malignant neoplasm of lower-inner quadrant of right female breast: Secondary | ICD-10-CM | POA: Diagnosis not present

## 2018-05-03 DIAGNOSIS — I700 Atherosclerosis of aorta: Secondary | ICD-10-CM | POA: Diagnosis not present

## 2018-05-16 ENCOUNTER — Other Ambulatory Visit: Payer: Medicare Other

## 2018-05-16 ENCOUNTER — Ambulatory Visit: Payer: Medicare Other | Admitting: Oncology

## 2018-05-16 ENCOUNTER — Encounter: Payer: Self-pay | Admitting: Oncology

## 2018-05-16 NOTE — Progress Notes (Signed)
No show

## 2018-06-21 ENCOUNTER — Telehealth: Payer: Self-pay | Admitting: Oncology

## 2018-06-21 NOTE — Telephone Encounter (Signed)
Scheduled appt per 12/2 sch message - pt daughter is aware of appt date and time

## 2018-06-23 ENCOUNTER — Telehealth: Payer: Self-pay | Admitting: Oncology

## 2018-06-23 ENCOUNTER — Inpatient Hospital Stay: Payer: Medicare Other | Attending: Adult Health | Admitting: Adult Health

## 2018-06-23 ENCOUNTER — Inpatient Hospital Stay: Payer: Medicare Other

## 2018-06-23 ENCOUNTER — Encounter: Payer: Self-pay | Admitting: Adult Health

## 2018-06-23 VITALS — BP 177/58 | HR 54 | Temp 97.9°F | Resp 18 | Ht 60.0 in | Wt 162.3 lb

## 2018-06-23 DIAGNOSIS — Z923 Personal history of irradiation: Secondary | ICD-10-CM | POA: Insufficient documentation

## 2018-06-23 DIAGNOSIS — Z7982 Long term (current) use of aspirin: Secondary | ICD-10-CM | POA: Insufficient documentation

## 2018-06-23 DIAGNOSIS — C50511 Malignant neoplasm of lower-outer quadrant of right female breast: Secondary | ICD-10-CM | POA: Insufficient documentation

## 2018-06-23 DIAGNOSIS — Z9011 Acquired absence of right breast and nipple: Secondary | ICD-10-CM | POA: Diagnosis not present

## 2018-06-23 DIAGNOSIS — I890 Lymphedema, not elsewhere classified: Secondary | ICD-10-CM | POA: Insufficient documentation

## 2018-06-23 DIAGNOSIS — Z171 Estrogen receptor negative status [ER-]: Secondary | ICD-10-CM | POA: Diagnosis not present

## 2018-06-23 DIAGNOSIS — Z79899 Other long term (current) drug therapy: Secondary | ICD-10-CM | POA: Insufficient documentation

## 2018-06-23 DIAGNOSIS — Z87891 Personal history of nicotine dependence: Secondary | ICD-10-CM | POA: Diagnosis not present

## 2018-06-23 DIAGNOSIS — C7800 Secondary malignant neoplasm of unspecified lung: Secondary | ICD-10-CM

## 2018-06-23 DIAGNOSIS — C773 Secondary and unspecified malignant neoplasm of axilla and upper limb lymph nodes: Secondary | ICD-10-CM

## 2018-06-23 DIAGNOSIS — C50311 Malignant neoplasm of lower-inner quadrant of right female breast: Secondary | ICD-10-CM

## 2018-06-23 LAB — CBC WITH DIFFERENTIAL (CANCER CENTER ONLY)
Abs Immature Granulocytes: 0.01 10*3/uL (ref 0.00–0.07)
Basophils Absolute: 0 10*3/uL (ref 0.0–0.1)
Basophils Relative: 1 %
Eosinophils Absolute: 0.1 10*3/uL (ref 0.0–0.5)
Eosinophils Relative: 3 %
HCT: 36.5 % (ref 36.0–46.0)
Hemoglobin: 11.3 g/dL — ABNORMAL LOW (ref 12.0–15.0)
Immature Granulocytes: 0 %
LYMPHS PCT: 29 %
Lymphs Abs: 1.4 10*3/uL (ref 0.7–4.0)
MCH: 27.9 pg (ref 26.0–34.0)
MCHC: 31 g/dL (ref 30.0–36.0)
MCV: 90.1 fL (ref 80.0–100.0)
Monocytes Absolute: 0.6 10*3/uL (ref 0.1–1.0)
Monocytes Relative: 13 %
Neutro Abs: 2.6 10*3/uL (ref 1.7–7.7)
Neutrophils Relative %: 54 %
Platelet Count: 232 10*3/uL (ref 150–400)
RBC: 4.05 MIL/uL (ref 3.87–5.11)
RDW: 12.6 % (ref 11.5–15.5)
WBC Count: 4.7 10*3/uL (ref 4.0–10.5)
nRBC: 0 % (ref 0.0–0.2)

## 2018-06-23 LAB — CMP (CANCER CENTER ONLY)
ALBUMIN: 3.6 g/dL (ref 3.5–5.0)
ALT: 7 U/L (ref 0–44)
AST: 16 U/L (ref 15–41)
Alkaline Phosphatase: 74 U/L (ref 38–126)
Anion gap: 11 (ref 5–15)
BUN: 17 mg/dL (ref 8–23)
CO2: 24 mmol/L (ref 22–32)
Calcium: 9.9 mg/dL (ref 8.9–10.3)
Chloride: 108 mmol/L (ref 98–111)
Creatinine: 1.37 mg/dL — ABNORMAL HIGH (ref 0.44–1.00)
GFR, Est AFR Am: 41 mL/min — ABNORMAL LOW (ref >60)
GFR, Estimated: 35 mL/min — ABNORMAL LOW (ref >60)
Glucose, Bld: 118 mg/dL — ABNORMAL HIGH (ref 70–99)
Potassium: 4.9 mmol/L (ref 3.5–5.1)
Sodium: 143 mmol/L (ref 135–145)
Total Bilirubin: 0.3 mg/dL (ref 0.3–1.2)
Total Protein: 7.5 g/dL (ref 6.5–8.1)

## 2018-06-23 NOTE — Telephone Encounter (Signed)
Gave patient avs and calendar. Labb added on per los.

## 2018-06-23 NOTE — Progress Notes (Signed)
[facility]  Telephone:([phone] Fax:([phone]) [phone]     ID: Jennifer Garrett DOB: 1932/01/09  MR#: [id number]  XTG#:[id number]  Patient Care Team: Nolene Ebbs, MD as PCP - General (Internal Medicine) Fanny Skates, MD as Consulting Physician (General Surgery) Magrinat, Virgie Dad, MD as Consulting Physician (Oncology) Minus Breeding, MD as Consulting Physician (Cardiology) Irene Limbo, MD as Consulting Physician (Plastic Surgery) PCP: Nolene Ebbs, MD  CHIEF COMPLAINT: Locally advanced triple negative breast cancer  CURRENT TREATMENT: Observation  INTERVAL HISTORY:  Jennifer Garrett returns today for follow-up of her triple negative breast cancer accompanied by her daughter Jennifer Garrett. She is under observation.  Since her last visit she underwent CT chest on 05/04/2018 that was stable and showed no findings to suggest recurrent or metastatic disease.  Jennifer Garrett notes pain (detailed in ROS) today, and increase in right arm lymphedema.  They cannot find her lymphedema wraps though.     REVIEW OF SYSTEMS: Jennifer Garrett has been doing ok.  She has been complaining about chest wall pain and pain in her upper abdomen.  The pain is noted to be in the right upper quadrant.  Her weight is stable.  Her appetite is good.  Jennifer Garrett has dementia, and cannot communicate the pain descriptions to me today.  Jennifer Garrett notes that she complains about this pain while sitting.  There has been no specific pattern noted.  No associated symptoms with the pain such as shortness of breath, nausea, vomiting, constipation, diarrhea.  She does have h/o coronary artery disease.     BREAST CANCER HISTORY: From the original intake note:  The patient presented to the emergency room 11/20/2015 with a complaint of a mass in the lower aspect of her right breast, which was painful and bleeding. The patient stated the mass had been present approximately a month. An ultrasound of the breast was obtained which found a 4.4 cm  mass in the right breast at the 6:00 position. This was read as most consistent with an abscess. The patient was started on clindamycin and referred to the [facility], where on 01/05/2016 she underwent bilateral diagnostic mammography with tomography and right  ultrasonography.   Mammography showed far posteriorly in the 5:00 region of the right breast a 4.1 cm mass with enlarged axillary adenopathy. On exam there was a fungating ulcerated bleeding mass in the right breast at the 5:00 position 7 cm from the nipple. The right axilla was "full" on palpation. Ultrasound showed a 5.4 cm mass at the 5:00 position of the right breast 7 cm from the nipple, with at least 3 enlarged abnormal axillary lymph nodes, the largest measuring 3.7 cm.  Biopsy of the right breast mass and one axillary lymph node on 01/06/2016 showed ([id number]) both to be positive for an invasive ductal carcinoma, grade 3, triple negative, with the HER-2 signals ratio between 1.21-1.40 and the number per cell 2.85-3.00. The MIB-1-2 was 70%.  The patient's subsequent history is as detailed below    PAST MEDICAL HISTORY: Past Medical History:  Diagnosis Date  . Arthritis   . Asthma    rarely uses neb  . Breast cancer ([facility]) 05/25/2016   right breast  . Breast cancer of lower-inner quadrant of right female breast ([facility]) 01/22/2016  . Carpal tunnel syndrome   . Coronary artery disease    DES to OM3 90% stenosis 2003, 90% small RCA stenosis.    . Diabetes mellitus    not taken glipizide in over a year, checks surgars daily  .  History of stomach ulcers   . Hyperlipemia   . Hypertension   . Irregular heart beat     PAST SURGICAL HISTORY: Past Surgical History:  Procedure Laterality Date  . ABDOMINAL HYSTERECTOMY    . CARDIAC CATHETERIZATION  2003   stent-  . CARPAL TUNNEL RELEASE  03/29/2012   Procedure: CARPAL TUNNEL RELEASE;  Surgeon: Wynonia Sours, MD;  Location: [facility];  Service: Orthopedics;   Laterality: Left;  . EYE SURGERY     catracts  . LESION EXCISION WITH COMPLEX REPAIR Right 05/25/2016   Procedure: complex repair of 25cm wound;  Surgeon: Irene Limbo, MD;  Location: [facility];  Service: Plastics;  Laterality: Right;  . MASTECTOMY     right  . MASTECTOMY MODIFIED RADICAL Right 05/25/2016   Procedure: RIGHT MASTECTOMY MODIFIED RADICAL;  Surgeon: Fanny Skates, MD;  Location: [facility];  Service: General;  Laterality: Right;  . PUNCH BIOPSY OF SKIN Left 04/04/2017   Procedure: PUNCH BIOPSY OF LEFT BREAST SKIN;  Surgeon: Fanny Skates, MD;  Location: [facility];  Service: General;  Laterality: Left;  . RADIOACTIVE SEED GUIDED AXILLARY SENTINEL LYMPH NODE Left 04/04/2017   Procedure: RADIOACTIVE SEED TARGETED LEFT AXILLARY LYMPH NODE EXCISION;  Surgeon: Fanny Skates, MD;  Location: [facility];  Service: General;  Laterality: Left;  . SHOULDER ARTHROSCOPY     right and left  . TRIGGER FINGER RELEASE  03/29/2012   Procedure: RELEASE TRIGGER FINGER/A-1 PULLEY;  Surgeon: Wynonia Sours, MD;  Location: [facility];  Service: Orthopedics;  Laterality: Left;    FAMILY HISTORY Family History  Problem Relation Age of Onset  . Heart disease Mother        No details   The patient's father died from "old age" at age 18. The patient's mother died at age 44 from complications of high blood pressure and diabetes. The patient had one brother, 4 sisters. There is no cancer in the family to her knowledge   GYNECOLOGIC HISTORY:  No LMP recorded. Patient has had a hysterectomy.  menarche age 40, first live birth age 46. The patient is GX P2. She underwent abdominal hysterectomy with bilateral salpingo-oophorectomy in her 30s. She did not undergo hormone replacement  SOCIAL HISTORY:  Jennifer Garrett used to work for [employer]. She lives with her daughter Jennifer Garrett, who is Medical sales representative for [employer]. The patient's son Jennifer Garrett lives  in [place]. The patient has 1 grandchild, no great-grandchildren. She attends a local [place] DIRECTIVES:  not in place. At the 01/22/2016 visit the patient was given the appropriate documents to complete. She tells me she intends to name her daughter Jennifer Garrett as her healthcare part of attorney    HEALTH MAINTENANCE: Social History   Tobacco Use  . Smoking status: Former Smoker    Last attempt to quit: 03/24/1978    Years since quitting: 40.2  . Smokeless tobacco: Never Used  Substance Use Topics  . Alcohol use: No  . Drug use: No     Colonoscopy: Never   PAP: status post hysterectomy   Bone density: 09/06/2017 with a T-score of -0.2   Allergies  Allergen Reactions  . Lyrica [Pregabalin] Swelling    SWELLING REACTION UNSPECIFIED   . Feldene [Piroxicam] Rash  . Lisinopril Other (See Comments)    States that it makes her "sensitive to light" unknown  . Orudis [Ketoprofen] Rash  . Vibramycin [Doxycycline Calcium]  Rash and Hives    Current Outpatient Medications  Medication Sig Dispense Refill  . albuterol (PROVENTIL HFA;VENTOLIN HFA) 108 (90 BASE) MCG/ACT inhaler Inhale 1 puff into the lungs every 6 (six) hours as needed for wheezing or shortness of breath. Reported on 02/04/2016    . aspirin EC 81 MG tablet Take 1 tablet by mouth every morning.    . calcium carbonate (OS-CAL) 600 MG TABS Take 600 mg by mouth 2 (two) times daily with a meal.    . diclofenac sodium (VOLTAREN) 1 % GEL Apply 1 application topically every other day.    . donepezil (ARICEPT) 5 MG tablet Take 5 mg by mouth at bedtime.    . ferrous sulfate 325 (65 FE) MG tablet Take 1 tablet (325 mg total) by mouth 2 (two) times daily with a meal. 60 tablet 3  . hydrochlorothiazide (MICROZIDE) 12.5 MG capsule Take 12.5 mg by mouth daily.    Marland Kitchen olmesartan (BENICAR) 40 MG tablet Take 40 mg by mouth daily.    Marland Kitchen atorvastatin (LIPITOR) 40 MG tablet TAKE 1 TABLET (40 MG TOTAL) BY MOUTH DAILY. 90 tablet  3   No current facility-administered medications for this visit.     OBJECTIVE:  Vitals:   06/23/18 0958  BP: (!) 177/58  Pulse: (!) 54  Resp: 18  Temp: 97.9 F (36.6 C)  SpO2: 99%     Body mass index is 31.7 kg/m.   ECOG FS:1 GENERAL: Patient is an older woman in no acute distress HEENT:  Sclerae anicteric.  Oropharynx clear and moist. No ulcerations or evidence of oropharyngeal candidiasis. Neck is supple.  NODES:  No cervical, supraclavicular, or axillary lymphadenopathy palpated.  BREAST EXAM:  Right breast s/p mastectomy and radiation, no sign of local recurrence, left breast benign LUNGS:  Clear to auscultation bilaterally.  No wheezes or rhonchi. HEART:  Regular rate and rhythm. No murmur appreciated. ABDOMEN:  Soft, nontender.  Positive, normoactive bowel sounds. No organomegaly palpated. MSK:  No focal spinal tenderness to palpation. Full range of motion bilaterally in the upper extremities. EXTREMITIES:  + right arm swelling SKIN:  Clear with no obvious rashes or skin changes. No nail dyscrasia. NEURO:  Nonfocal. Well oriented.  Appropriate affect.   LAB RESULTS:  CMP     Component Value Date/Time   NA 142 11/01/2017 1421   NA 143 05/04/2017 1418   K 5.0 11/01/2017 1421   K 4.2 05/04/2017 1418   CL 109 11/01/2017 1421   CO2 25 11/01/2017 1421   CO2 28 05/04/2017 1418   GLUCOSE 143 (H) 11/01/2017 1421   GLUCOSE 125 05/04/2017 1418   BUN 22 (H) 11/01/2017 1421   BUN 22.7 05/04/2017 1418   CREATININE 1.29 (H) 11/01/2017 1421   CREATININE 1.13 (H) 10/27/2017 1213   CREATININE 1.1 05/04/2017 1418   CALCIUM 9.7 11/01/2017 1421   CALCIUM 9.3 05/04/2017 1418   PROT 7.7 11/01/2017 1421   PROT 7.2 05/04/2017 1418   ALBUMIN 3.7 11/01/2017 1421   ALBUMIN 3.3 (L) 05/04/2017 1418   AST 18 11/01/2017 1421   AST 17 10/27/2017 1213   AST 20 05/04/2017 1418   ALT 12 (L) 11/01/2017 1421   ALT 8 10/27/2017 1213   ALT 11 05/04/2017 1418   ALKPHOS 66 11/01/2017 1421     ALKPHOS 67 05/04/2017 1418   BILITOT 0.4 11/01/2017 1421   BILITOT 0.4 10/27/2017 1213   BILITOT 0.32 05/04/2017 1418   GFRNONAA 37 (L) 11/01/2017 1421   GFRNONAA  44 (L) 10/27/2017 1213   GFRAA 43 (L) 11/01/2017 1421   GFRAA 51 (L) 10/27/2017 1213    INo results found for: SPEP, UPEP  Lab Results  Component Value Date   WBC 4.5 11/01/2017   NEUTROABS 2.5 11/01/2017   HGB 11.3 (L) 11/01/2017   HCT 35.9 11/01/2017   MCV 87.8 11/01/2017   PLT 198 11/01/2017      Chemistry      Component Value Date/Time   NA 142 11/01/2017 1421   NA 143 05/04/2017 1418   K 5.0 11/01/2017 1421   K 4.2 05/04/2017 1418   CL 109 11/01/2017 1421   CO2 25 11/01/2017 1421   CO2 28 05/04/2017 1418   BUN 22 (H) 11/01/2017 1421   BUN 22.7 05/04/2017 1418   CREATININE 1.29 (H) 11/01/2017 1421   CREATININE 1.13 (H) 10/27/2017 1213   CREATININE 1.1 05/04/2017 1418      Component Value Date/Time   CALCIUM 9.7 11/01/2017 1421   CALCIUM 9.3 05/04/2017 1418   ALKPHOS 66 11/01/2017 1421   ALKPHOS 67 05/04/2017 1418   AST 18 11/01/2017 1421   AST 17 10/27/2017 1213   AST 20 05/04/2017 1418   ALT 12 (L) 11/01/2017 1421   ALT 8 10/27/2017 1213   ALT 11 05/04/2017 1418   BILITOT 0.4 11/01/2017 1421   BILITOT 0.4 10/27/2017 1213   BILITOT 0.32 05/04/2017 1418       No results found for: LABCA2  No components found for: LABCA125  No results for input(s): INR in the last 168 hours.  Urinalysis    Component Value Date/Time   COLORURINE YELLOW 05/19/2016 1138   APPEARANCEUR CLEAR 05/19/2016 1138   LABSPEC 1.017 05/19/2016 1138   PHURINE 5.5 05/19/2016 1138   GLUCOSEU NEGATIVE 05/19/2016 1138   HGBUR NEGATIVE 05/19/2016 1138   BILIRUBINUR NEGATIVE 05/19/2016 1138   KETONESUR NEGATIVE 05/19/2016 1138   PROTEINUR NEGATIVE 05/19/2016 1138   UROBILINOGEN 1.0 11/28/2013 0143   NITRITE NEGATIVE 05/19/2016 1138   LEUKOCYTESUR NEGATIVE 05/19/2016 1138     STUDIES: No results  found.  ASSESSMENT: 84 y.o. [place] woman status post right breast lower outer quadrant and right axillary lymph node biopsy 01/06/2016 both positive for a clinical T4 N1 M1, stage IV invasive ductal carcinoma, grade 3, triple negative, with an MIB-1 of 70%  (a) CT scan of the chest and bone scan 01/27/2016 showed scattered pulmonary nodules in addition to locally advanced disease  (b) repeat CT scan of the chest 04/12/2016 shows persistent pulmonary nodules, progression in local disease  (c) CA-27-29 is noninformative  (1) neoadjuvant chemotherapy consisting of capecitabine, 1500 mg twice daily, 7 days on 7 days off, starting 01/27/2016, discontinued 04/14/2016 with progression  (2) right modified radical mastectomy 05/25/2016 showed a pT3 pN2, stage IIIA invasive ductal carcinoma, grade 3, with negative margins.  (3) adjuvant radiation 08/24/16- 10/07/16 Site/dose:   1) Right chest wall / 50.4 Gy in 28 fractions                         2) Right chest wall boost / 10 Gy in 5 fractions  (4) restaging studies and follow-up:  (a) PET scan 08/24/2016 was negative except for a 1.0 cm LEFT axillary lymph node  (b)  biopsy of the left axillary lymph node 02/22/2017 showed breast carcinoma, again triple negative  (c) CT chest on 10/27/2017 showed: No evidence of breast cancer recurrence.   (d) CT chest on 05/04/2018  showed: NED  (5) on 04/04/2017 she underwent removal of the left axillary mass (measuring 4.8 cm,) together with 2 benign lymph nodes.  (a) skin punch biopsy on the same day was benign  (6) adjuvant radiation to the left axillary tumor bed completed 07/01/2017:  Site/dose:   Left axilla / 50 Gy in 25 fy  PLAN: Tasheba is doing moderately well today. I ordered CT chest to evaluate for recurrence as far as the chest discomfort is concerned.  We reviewed her CAD risk and she knows that if the pain comes back, or is accompanied by symptoms, she would need to be evaluated in ER.  Also,  if CT scan remains stable, she will need to see her cardiologist.  They understand these recommendations.  Her lymphedema has worsened and they cannot find her compression wraps.  I sent a referral over to physical therapy so that they can help her with this.    She will have some labs today, and will return in 6 months for labs and f/u with Dr. Jana Hakim.  This of course may change depending on the results of the CT scan.  Jennifer Garrett, Avah's daughter knows to call for any questions or concerns.    A total of (30) minutes of face-to-face time was spent with this patient with greater than 50% of that time in counseling and care-coordination.  Jennifer Bihari, NP  06/23/18 10:06 AM Medical Oncology and Hematology [facility] [address] [place], [place] [address] Tel. [phone]    Fax. [phone]

## 2018-06-28 ENCOUNTER — Encounter: Payer: Self-pay | Admitting: Physical Therapy

## 2018-06-28 ENCOUNTER — Ambulatory Visit: Payer: Medicare Other | Attending: Adult Health | Admitting: Physical Therapy

## 2018-06-28 DIAGNOSIS — M25611 Stiffness of right shoulder, not elsewhere classified: Secondary | ICD-10-CM | POA: Diagnosis present

## 2018-06-28 DIAGNOSIS — M6281 Muscle weakness (generalized): Secondary | ICD-10-CM

## 2018-06-28 DIAGNOSIS — I972 Postmastectomy lymphedema syndrome: Secondary | ICD-10-CM | POA: Insufficient documentation

## 2018-06-28 NOTE — Therapy (Signed)
[place], [place], [address] Phone: [phone]   Fax:  [phone]  Physical Therapy Evaluation  Patient Details  Name: Jennifer Garrett MRN: [id number] Date of Birth: 21-Aug-1931 Referring Provider (PT): Romualdo Bolk Cornetto    Encounter Date: 06/28/2018  PT End of Session - 06/28/18 1647    Visit Number  1    Number of Visits  19    Date for PT Re-Evaluation  08/29/18    PT Start Time  1400   pt arrives 15 minutes late for session   PT Stop Time  1430    PT Time Calculation (min)  30 min    Activity Tolerance  Patient tolerated treatment well    Behavior During Therapy  [facility] for tasks assessed/performed       Past Medical History:  Diagnosis Date  . Arthritis   . Asthma    rarely uses neb  . Breast cancer ([place]) 05/25/2016   right breast  . Breast cancer of lower-inner quadrant of right female breast ([place]) 01/22/2016  . Carpal tunnel syndrome   . Coronary artery disease    DES to OM3 90% stenosis 2003, 90% small RCA stenosis.    . Diabetes mellitus    not taken glipizide in over a year, checks surgars daily  . History of stomach ulcers   . Hyperlipemia   . Hypertension   . Irregular heart beat     Past Surgical History:  Procedure Laterality Date  . ABDOMINAL HYSTERECTOMY    . CARDIAC CATHETERIZATION  2003   stent-  . CARPAL TUNNEL RELEASE  03/29/2012   Procedure: CARPAL TUNNEL RELEASE;  Surgeon: Wynonia Sours, MD;  Location: [place];  Service: Orthopedics;  Laterality: Left;  . EYE SURGERY     catracts  . LESION EXCISION WITH COMPLEX REPAIR Right 05/25/2016   Procedure: complex repair of 25cm wound;  Surgeon: Irene Limbo, MD;  Location: [place];  Service: Plastics;  Laterality: Right;  . MASTECTOMY     right  . MASTECTOMY MODIFIED RADICAL Right 05/25/2016   Procedure: RIGHT MASTECTOMY MODIFIED RADICAL;  Surgeon: Fanny Skates, MD;  Location: [place];  Service:  General;  Laterality: Right;  . PUNCH BIOPSY OF SKIN Left 04/04/2017   Procedure: PUNCH BIOPSY OF LEFT BREAST SKIN;  Surgeon: Fanny Skates, MD;  Location: [place];  Service: General;  Laterality: Left;  . RADIOACTIVE SEED GUIDED AXILLARY SENTINEL LYMPH NODE Left 04/04/2017   Procedure: RADIOACTIVE SEED TARGETED LEFT AXILLARY LYMPH NODE EXCISION;  Surgeon: Fanny Skates, MD;  Location: [place];  Service: General;  Laterality: Left;  . SHOULDER ARTHROSCOPY     right and left  . TRIGGER FINGER RELEASE  03/29/2012   Procedure: RELEASE TRIGGER FINGER/A-1 PULLEY;  Surgeon: Wynonia Sours, MD;  Location: [place];  Service: Orthopedics;  Laterality: Left;    There were no vitals filed for this visit.   Subjective Assessment - 06/28/18 1426    Subjective  Pt states her swelling has gotten worse especially in the upper arm.  She comes in with her son and his wife who will be taking care of her during the day  She comes in a Tribute night and power sleeve. Son says he will be able to help pt put her daytime sleeve on during the day when she gets it.     Patient is accompained by:  Family member   Legrand Como  and Inez Catalina    Pertinent History  2 strokes, poor recall and memory, bilateral triple negative breast cancer with mets to the lungs treated with Rt modified radical mastectomy and ALND of 21 nodes, Lt lumpectomy with removal of 2 nodes, bilateral radiation and chemotherapy    Patient Stated Goals  family goal is to get the swelling under control and to get the right garment     Currently in Pain?  No/denies         [facility] PT Assessment - 06/28/18 0001      Assessment   Medical Diagnosis  Rt UE lymphedema after modified radical mastectomy    Referring Provider (PT)  Romualdo Bolk Cornetto     Onset Date/Surgical Date  05/25/16    Hand Dominance  Right    Prior Therapy  yes      Precautions   Precaution Comments  cancer, lymphedema       Balance Screen   Has the patient fallen in the past 6 months  No    Has the patient had a decrease in activity level because of a fear of falling?   No    Is the patient reluctant to leave their home because of a fear of falling?   No      Home Environment   Living Environment  Private residence    Living Arrangements  Children    Available Help at Discharge  Available 24 hours/day    Type of Home  House      Prior Function   Level of Independence  Independent with basic ADLs    Vocation  Retired    Leisure  --      Cognition   Memory  Impaired      Observation/Other Assessments   Observations  pt comes in with son and daughter in [employer].  Her right arm is visibly swollen and she has contraction in right chest and axilla     Skin Integrity  no open areas      Posture/Postural Control   Posture/Postural Control  Postural limitations    Postural Limitations  Rounded Shoulders;Forward head;Increased thoracic kyphosis      AROM   Overall AROM Comments  --    Right Shoulder Flexion  90 Degrees    Right Shoulder ABduction  90 Degrees    Right Shoulder External Rotation  80 Degrees    Left Shoulder Flexion  --    Left Shoulder ABduction  --    Left Shoulder External Rotation  --      Strength   Overall Strength Comments  --      Palpation   Palpation comment  Thick boggy lymphostatic lymphedema in entire right arm with pitting occuing after moments of compression, Decreased tissue extensibility in right axilla         LYMPHEDEMA/ONCOLOGY QUESTIONNAIRE - 06/28/18 1646      Type   Cancer Type  breast cancer       Surgeries   Mastectomy Date  05/25/16    Lumpectomy Date  04/04/17      Treatment   Active Chemotherapy Treatment  No    Past Chemotherapy Treatment  Yes    Date  01/27/16    Active Radiation Treatment  No    Past Radiation Treatment  Yes    Current Hormone Treatment  No    Past Hormone Therapy  No      Right Upper Extremity Lymphedema   15 cm Proximal to  Olecranon Process  38 cm    10 cm Proximal to Olecranon Process  42 cm    Olecranon Process  32 cm    15 cm Proximal to Ulnar Styloid Process  31 cm    10 cm Proximal to Ulnar Styloid Process  27 cm    Just Proximal to Ulnar Styloid Process  19 cm    Across Hand at [employer]  20.5 cm    At [place] of 2nd Digit  6 cm             Outpatient Re[facility] from [facility]  Lymphedema Life Impact Scale Total Score  8.82 %      Objective measurements completed on examination: See above findings.          Tribute nIght compression garment with power sleeve  Too small for upper arm          PT Short Term Goals - 06/28/18 1857      PT SHORT TERM GOAL #1   Title  Pt will have reduction of right arm at 10 cm proximal to the olecranon by 2 cm        PT Long Term Goals - 06/28/18 1902      PT LONG TERM GOAL #1   Title  Pt will have reduction on right upper arm at 20 cm proximal to the olecranon by 3 cm    Baseline  42 cm on eval     Time  8    Period  Weeks    Status  New      PT LONG TERM GOAL #2   Title  Son with be independent in donning and doffing day and nighttime compression garments for pt    Time  8    Period  Weeks    Status  New      PT LONG TERM GOAL #3   Title  Pt will have appropriate day and nighttime compression garments    Time  8    Period  Weeks    Status  New             Plan - 06/28/18 1648    Clinical Impression Statement  Pt has had an exacerbation of right UE lymphedema that seems to be larger in above elbow.  Her tributre night is too small at the upper arm and she does not have a daytime garment.  She agrees to undergo bandaging again prior to being fit for day time garment and hopefully tribute will fit again or she will need her current tribute revised.  Her son agrees he will be able to bring pt to sessions and apply her garments for her when they are received.  Pt wants to start  treament after New years so she can come regulary 3 times a week for decongestion son agreed to let demographics be sent to [facility]     History and Personal Factors relevant to plan of care:  hx of metastatic triple negatiave breast cancer with lung mets    Clinical Presentation  Stable    Rehab Potential  Good    Clinical Impairments Affecting Rehab Potential  metastatic cancer     PT Frequency  3x / week    PT Duration  8 weeks    PT Treatment/Interventions  Compression bandaging;Manual lymph drainage;Manual techniques;Patient/family education;DME Instruction;Therapeutic exercise    PT Next Visit Plan  Begin  complete CDT to Rt UE. arrange for  new daytime garments, see if current Tribute night will fit or need to be adjusted     Consulted and Agree with Plan of Care  Patient;Family member/caregiver    Family Member Consulted  son Legrand Como and daughter in Energy manager       Patient will benefit from skilled therapeutic intervention in order to improve the following deficits and impairments:  Decreased skin integrity, Increased edema  Visit Diagnosis: Postmastectomy lymphedema - Plan: PT plan of care cert/re-cert  Muscle weakness (generalized) - Plan: PT plan of care cert/re-cert  Stiffness of right shoulder, not elsewhere classified - Plan: PT plan of care cert/re-cert     Problem List Patient Active Problem List   Diagnosis Date Noted  . 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 Preop cardiovascular exam 03/18/2017  . Essential hypertension 03/18/2017  . Dyslipidemia 03/18/2017  . Coronary artery disease involving native coronary artery of native heart without angina pectoris 03/18/2017  . Bruit 03/18/2017  . Lung metastases ([place]) 06/23/2016  . Malignant neoplasm of lower-inner quadrant of right breast of female, estrogen receptor negative ([place]) 01/22/2016   Donato Heinz. Owens Shark PT  Norwood Levo 06/28/2018, 7:10 PM  [place], [place], [address] Phone: [phone]   Fax:  [phone]  Name: Jennifer Garrett MRN: [id number] Date of Birth: 1932-04-11

## 2018-07-05 ENCOUNTER — Ambulatory Visit (HOSPITAL_COMMUNITY): Payer: Medicare Other

## 2018-07-24 ENCOUNTER — Encounter: Payer: Self-pay | Admitting: Rehabilitation

## 2018-07-24 ENCOUNTER — Ambulatory Visit: Payer: Medicare Other | Attending: Adult Health | Admitting: Rehabilitation

## 2018-07-24 DIAGNOSIS — M25611 Stiffness of right shoulder, not elsewhere classified: Secondary | ICD-10-CM | POA: Diagnosis present

## 2018-07-24 DIAGNOSIS — Z483 Aftercare following surgery for neoplasm: Secondary | ICD-10-CM | POA: Diagnosis present

## 2018-07-24 DIAGNOSIS — M6281 Muscle weakness (generalized): Secondary | ICD-10-CM

## 2018-07-24 DIAGNOSIS — I972 Postmastectomy lymphedema syndrome: Secondary | ICD-10-CM | POA: Diagnosis present

## 2018-07-24 DIAGNOSIS — R293 Abnormal posture: Secondary | ICD-10-CM | POA: Insufficient documentation

## 2018-07-24 NOTE — Therapy (Signed)
[place], [place], [address] Phone: [phone]   Fax:  [phone]  Physical Therapy Treatment  Patient Details  Name: Jennifer Garrett MRN: [id number] Date of Birth: 01-09-1932 Referring Provider (PT): Romualdo Bolk Cornetto    Encounter Date: 07/24/2018  PT End of Session - 07/24/18 1345    Visit Number  2    Number of Visits  19    Date for PT Re-Evaluation  08/29/18    PT Start Time  1316   pt late   PT Stop Time  1345    PT Time Calculation (min)  29 min    Activity Tolerance  Patient tolerated treatment well    Behavior During Therapy  [facility] for tasks assessed/performed       Past Medical History:  Diagnosis Date  . Arthritis   . Asthma    rarely uses neb  . Breast cancer ([place]) 05/25/2016   right breast  . Breast cancer of lower-inner quadrant of right female breast ([place]) 01/22/2016  . Carpal tunnel syndrome   . Coronary artery disease    DES to OM3 90% stenosis 2003, 90% small RCA stenosis.    . Diabetes mellitus    not taken glipizide in over a year, checks surgars daily  . History of stomach ulcers   . Hyperlipemia   . Hypertension   . Irregular heart beat     Past Surgical History:  Procedure Laterality Date  . ABDOMINAL HYSTERECTOMY    . CARDIAC CATHETERIZATION  2003   stent-  . CARPAL TUNNEL RELEASE  03/29/2012   Procedure: CARPAL TUNNEL RELEASE;  Surgeon: Wynonia Sours, MD;  Location: [place];  Service: Orthopedics;  Laterality: Left;  . EYE SURGERY     catracts  . LESION EXCISION WITH COMPLEX REPAIR Right 05/25/2016   Procedure: complex repair of 25cm wound;  Surgeon: Irene Limbo, MD;  Location: [place];  Service: Plastics;  Laterality: Right;  . MASTECTOMY     right  . MASTECTOMY MODIFIED RADICAL Right 05/25/2016   Procedure: RIGHT MASTECTOMY MODIFIED RADICAL;  Surgeon: Fanny Skates, MD;  Location: [place];  Service: General;  Laterality: Right;  .  PUNCH BIOPSY OF SKIN Left 04/04/2017   Procedure: PUNCH BIOPSY OF LEFT BREAST SKIN;  Surgeon: Fanny Skates, MD;  Location: [place];  Service: General;  Laterality: Left;  . RADIOACTIVE SEED GUIDED AXILLARY SENTINEL LYMPH NODE Left 04/04/2017   Procedure: RADIOACTIVE SEED TARGETED LEFT AXILLARY LYMPH NODE EXCISION;  Surgeon: Fanny Skates, MD;  Location: [place];  Service: General;  Laterality: Left;  . SHOULDER ARTHROSCOPY     right and left  . TRIGGER FINGER RELEASE  03/29/2012   Procedure: RELEASE TRIGGER FINGER/A-1 PULLEY;  Surgeon: Wynonia Sours, MD;  Location: [place];  Service: Orthopedics;  Laterality: Left;    There were no vitals filed for this visit.  Subjective Assessment - 07/24/18 1315    Subjective  No complaints    Pertinent History  2 strokes, poor recall and memory, bilateral triple negative breast cancer with mets to the lungs treated with Rt modified radical mastectomy and ALND of 21 nodes, Lt lumpectomy with removal of 2 nodes, bilateral radiation and chemotherapy    Patient Stated Goals  family goal is to get the swelling under control and to get the right garment     Currently in Pain?  No/denies  Outpatient Re[facility] from [facility]  Lymphedema Life Impact Scale Total Score  8.82 %           [facility] Adult PT Treatment/Exercise - 07/24/18 0001      Manual Therapy   Manual Therapy  Manual Lymphatic Drainage (MLD);Compression Bandaging    Manual therapy comments  education to pt x 3 and daughter about removing bandages if too tight or if SOB     Manual Lymphatic Drainage (MLD)  short session due to pt 30min late; short neck, Lt axillary nodes and interaxillary pathway, Rt inguinal nodes and axillo inguinal pathway, work on the Rt UE from proximal to distal and then reversing steps    Compression Bandaging  lotion applied, thick stockinette,  artiflex from hand to axilla, finger wrap 1-4 due to no sig finger swelling, 1-6cm, 1-8cm and 2-10cm bandages from hand to axilla               PT Short Term Goals - 06/28/18 1857      PT SHORT TERM GOAL #1   Title  Pt will have reduction of right arm at 10 cm proximal to the olecranon by 2 cm        PT Long Term Goals - 06/28/18 1902      PT LONG TERM GOAL #1   Title  Pt will have reduction on right upper arm at 20 cm proximal to the olecranon by 3 cm    Baseline  42 cm on eval     Time  8    Period  Weeks    Status  New      PT LONG TERM GOAL #2   Title  Son with be independent in donning and doffing day and nighttime compression garments for pt    Time  8    Period  Weeks    Status  New      PT LONG TERM GOAL #3   Title  Pt will have appropriate day and nighttime compression garments    Time  8    Period  Weeks    Status  New            Plan - 07/24/18 1345    Clinical Impression Statement  Began CDT on the Rt UE. Pt late so short MLD session.  Tolerated well     PT Frequency  3x / week    PT Duration  8 weeks    PT Treatment/Interventions  Compression bandaging;Manual lymph drainage;Manual techniques;Patient/family education;DME Instruction;Therapeutic exercise    PT Next Visit Plan  Begin  complete CDT to Rt UE. arrange for new daytime garments, see if current Tribute night will fit or need to be adjusted     Consulted and Agree with Plan of Care  Patient;Family member/caregiver       Patient will benefit from skilled therapeutic intervention in order to improve the following deficits and impairments:  Decreased skin integrity, Increased edema  Visit Diagnosis: Postmastectomy lymphedema  Muscle weakness (generalized)  Stiffness of right shoulder, not elsewhere classified  Aftercare following surgery for neoplasm  Abnormal posture     Problem List Patient Active Problem List   Diagnosis Date Noted  . Metastasis to infraclavicular lymph  node ([place]) 05/12/2017  . Preop cardiovascular exam 03/18/2017  . Essential hypertension 03/18/2017  . Dyslipidemia 03/18/2017  . Coronary artery disease involving native coronary artery of native heart without angina pectoris 03/18/2017  . Bruit 03/18/2017  . Lung  metastases ([place]) 06/23/2016  . Malignant neoplasm of lower-inner quadrant of right breast of female, estrogen receptor negative ([place]) 01/22/2016    Shan Levans, PT 07/24/2018, 1:46 PM  [place], [place], [address] Phone: [phone]   Fax:  [phone]  Name: Jennifer Garrett MRN: [id number] Date of Birth: 06/24/1932

## 2018-07-25 ENCOUNTER — Ambulatory Visit (HOSPITAL_COMMUNITY)
Admission: RE | Admit: 2018-07-25 | Discharge: 2018-07-25 | Disposition: A | Discharge status: Home / Self Care | Payer: Medicare Other | Source: Ambulatory Visit | Attending: Adult Health | Admitting: Adult Health

## 2018-07-25 ENCOUNTER — Encounter (HOSPITAL_COMMUNITY): Payer: Self-pay

## 2018-07-25 DIAGNOSIS — C50311 Malignant neoplasm of lower-inner quadrant of right female breast: Secondary | ICD-10-CM | POA: Insufficient documentation

## 2018-07-25 DIAGNOSIS — C7800 Secondary malignant neoplasm of unspecified lung: Secondary | ICD-10-CM | POA: Insufficient documentation

## 2018-07-25 DIAGNOSIS — Z171 Estrogen receptor negative status [ER-]: Secondary | ICD-10-CM | POA: Insufficient documentation

## 2018-07-25 DIAGNOSIS — C773 Secondary and unspecified malignant neoplasm of axilla and upper limb lymph nodes: Secondary | ICD-10-CM

## 2018-07-26 ENCOUNTER — Ambulatory Visit: Payer: Medicare Other

## 2018-07-26 ENCOUNTER — Telehealth: Payer: Self-pay

## 2018-07-26 DIAGNOSIS — I972 Postmastectomy lymphedema syndrome: Secondary | ICD-10-CM | POA: Diagnosis not present

## 2018-07-26 DIAGNOSIS — M6281 Muscle weakness (generalized): Secondary | ICD-10-CM

## 2018-07-26 DIAGNOSIS — M25611 Stiffness of right shoulder, not elsewhere classified: Secondary | ICD-10-CM

## 2018-07-26 NOTE — Telephone Encounter (Signed)
Spoke with Ernie Avena, patient's daughter, to inform about Chest CT results and NP recommendations for cardiology f/u.  Daughter says that patient has just started PT and has only been to one session so far.  Nurse encouraged daughter to call with any issues/concerns.

## 2018-07-26 NOTE — Telephone Encounter (Signed)
-----   Message from Gardenia Phlegm, NP sent at 07/26/2018  7:03 AM EST ----- Please call patient and her daughter (I believe is who accompanied her to her appointment) and let her know that CT chest is stable.  No sign of cancer causing problems.  Recommend f/u with cardiology.  How is the lymphedema, did she get into PT?  Thanks, Foster ----- Message ----- From: Interface, Rad Results In Sent: 07/25/2018   2:58 PM EST To: Gardenia Phlegm, NP

## 2018-07-26 NOTE — Therapy (Signed)
[place], [place], [address] Phone: [phone]   Fax:  [phone]  Physical Therapy Treatment  Patient Details  Name: Jennifer Garrett MRN: [id number] Date of Birth: 1932/01/18 Referring Provider (PT): Romualdo Bolk Cornetto    Encounter Date: 07/26/2018  PT End of Session - 07/26/18 1356    Visit Number  3    Number of Visits  19    Date for PT Re-Evaluation  08/29/18    PT Start Time  1322   Pt arrived late   PT Stop Time  1353    PT Time Calculation (min)  31 min    Activity Tolerance  Patient tolerated treatment well    Behavior During Therapy  [facility] for tasks assessed/performed       Past Medical History:  Diagnosis Date  . Arthritis   . Asthma    rarely uses neb  . Breast cancer ([place]) 05/25/2016   right breast  . Breast cancer of lower-inner quadrant of right female breast ([place]) 01/22/2016  . Carpal tunnel syndrome   . Coronary artery disease    DES to OM3 90% stenosis 2003, 90% small RCA stenosis.    . Diabetes mellitus    not taken glipizide in over a year, checks surgars daily  . History of stomach ulcers   . Hyperlipemia   . Hypertension   . Irregular heart beat     Past Surgical History:  Procedure Laterality Date  . ABDOMINAL HYSTERECTOMY    . CARDIAC CATHETERIZATION  2003   stent-  . CARPAL TUNNEL RELEASE  03/29/2012   Procedure: CARPAL TUNNEL RELEASE;  Surgeon: Wynonia Sours, MD;  Location: [place];  Service: Orthopedics;  Laterality: Left;  . EYE SURGERY     catracts  . LESION EXCISION WITH COMPLEX REPAIR Right 05/25/2016   Procedure: complex repair of 25cm wound;  Surgeon: Irene Limbo, MD;  Location: [place];  Service: Plastics;  Laterality: Right;  . MASTECTOMY     right  . MASTECTOMY MODIFIED RADICAL Right 05/25/2016   Procedure: RIGHT MASTECTOMY MODIFIED RADICAL;  Surgeon: Fanny Skates, MD;  Location: [place];  Service: General;  Laterality: Right;   . PUNCH BIOPSY OF SKIN Left 04/04/2017   Procedure: PUNCH BIOPSY OF LEFT BREAST SKIN;  Surgeon: Fanny Skates, MD;  Location: [place];  Service: General;  Laterality: Left;  . RADIOACTIVE SEED GUIDED AXILLARY SENTINEL LYMPH NODE Left 04/04/2017   Procedure: RADIOACTIVE SEED TARGETED LEFT AXILLARY LYMPH NODE EXCISION;  Surgeon: Fanny Skates, MD;  Location: [place];  Service: General;  Laterality: Left;  . SHOULDER ARTHROSCOPY     right and left  . TRIGGER FINGER RELEASE  03/29/2012   Procedure: RELEASE TRIGGER FINGER/A-1 PULLEY;  Surgeon: Wynonia Sours, MD;  Location: [place];  Service: Orthopedics;  Laterality: Left;    There were no vitals filed for this visit.  Subjective Assessment - 07/26/18 1332    Subjective  Bandages were fine, no problems.    Pertinent History  2 strokes, poor recall and memory, bilateral triple negative breast cancer with mets to the lungs treated with Rt modified radical mastectomy and ALND of 21 nodes, Lt lumpectomy with removal of 2 nodes, bilateral radiation and chemotherapy    Patient Stated Goals  family goal is to get the swelling under control and to get the right garment  LYMPHEDEMA/ONCOLOGY QUESTIONNAIRE - 07/26/18 1333      Right Upper Extremity Lymphedema   15 cm Proximal to Olecranon Process  36.4 cm    10 cm Proximal to Olecranon Process  37.4 cm    Olecranon Process  29.4 cm    15 cm Proximal to Ulnar Styloid Process  29 cm    10 cm Proximal to Ulnar Styloid Process  25.1 cm    Just Proximal to Ulnar Styloid Process  17 cm    Across Hand at [employer]  18.7 cm    At [place] of 2nd Digit  5.8 cm           Outpatient Re[facility] from [facility]  Lymphedema Life Impact Scale Total Score  8.82 %           [facility] Adult PT Treatment/Exercise - 07/26/18 0001      Manual Therapy   Compression Bandaging  lotion applied,  thick stockinette, [place] to fingers 1-2 with paper tape; artiflex from hand to axilla, 1-6cm, 1-8cm and 2-10cm bandages from hand to axilla               PT Short Term Goals - 06/28/18 1857      PT SHORT TERM GOAL #1   Title  Pt will have reduction of right arm at 10 cm proximal to the olecranon by 2 cm        PT Long Term Goals - 07/26/18 1519      PT LONG TERM GOAL #1   Title  Pt will have reduction on right upper arm at 10 cm proximal to the olecranon by 3 cm    Baseline  42 cm on eval ; 37.4 cm today (4.6 cm reduction)-07/26/18    Status  Achieved      PT LONG TERM GOAL #2   Title  Son with be independent in donning and doffing day and nighttime compression garments for pt    Baseline  Daughter is to bring newest compression garment(s) to next visit for assess of effectiveness and fit-07/26/18    Status  On-going      PT LONG TERM GOAL #3   Title  Pt will have appropriate day and nighttime compression garments    Baseline  Daughter to bring to next session to assess-07/26/18    Status  On-going            Plan - 07/26/18 1510    Clinical Impression Statement  Pt did well with keeping her bandages on from Mondays visit though her hand bandage was wet at thumb web space so issued new 6 cm with instructions to pt (and daughter) to keep other in case this happens again. Her circumference measurements were greatly reduced from last time measured and requested of daughter and pt that they bring her newer compression garments to next session as they should fit now. Daughter verbalized understanding. As they arrived late today, only time for measurements and reapplying compression bandages.     Rehab Potential  Good    Clinical Impairments Affecting Rehab Potential  metastatic cancer     PT Frequency  3x / week    PT Duration  8 weeks    PT Treatment/Interventions  Compression bandaging;Manual lymph drainage;Manual techniques;Patient/family education;DME  Instruction;Therapeutic exercise    PT Next Visit Plan  Cont complete CDT to Rt UE. arrange for new daytime garments, see if current Tribute night will fit or need to be adjusted  Consulted and Agree with Plan of Care  Patient       Patient will benefit from skilled therapeutic intervention in order to improve the following deficits and impairments:  Decreased skin integrity, Increased edema  Visit Diagnosis: Postmastectomy lymphedema  Muscle weakness (generalized)  Stiffness of right shoulder, not elsewhere classified     Problem List Patient Active Problem List   Diagnosis Date Noted  . 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 Preop cardiovascular exam 03/18/2017  . Essential hypertension 03/18/2017  . Dyslipidemia 03/18/2017  . Coronary artery disease involving native coronary artery of native heart without angina pectoris 03/18/2017  . Bruit 03/18/2017  . Lung metastases ([place]) 06/23/2016  . Malignant neoplasm of lower-inner quadrant of right breast of female, estrogen receptor negative ([place]) 01/22/2016    Otelia Limes, PTA 07/26/2018, 3:24 PM  [place], [place], [address] Phone: [phone]   Fax:  [phone]  Name: ALTHA SWEITZER MRN: [id number] Date of Birth: Jan 03, 1932

## 2018-07-28 ENCOUNTER — Ambulatory Visit: Payer: Medicare Other | Admitting: Rehabilitation

## 2018-07-28 ENCOUNTER — Encounter: Payer: Self-pay | Admitting: Rehabilitation

## 2018-07-28 DIAGNOSIS — M6281 Muscle weakness (generalized): Secondary | ICD-10-CM

## 2018-07-28 DIAGNOSIS — Z483 Aftercare following surgery for neoplasm: Secondary | ICD-10-CM

## 2018-07-28 DIAGNOSIS — I972 Postmastectomy lymphedema syndrome: Secondary | ICD-10-CM

## 2018-07-28 DIAGNOSIS — R293 Abnormal posture: Secondary | ICD-10-CM

## 2018-07-28 DIAGNOSIS — M25611 Stiffness of right shoulder, not elsewhere classified: Secondary | ICD-10-CM

## 2018-07-28 NOTE — Therapy (Signed)
[place], [place], [address] Phone: [phone]   Fax:  [phone]  Physical Therapy Treatment  Patient Details  Name: Jennifer Garrett MRN: [id number] Date of Birth: 03-14-1932 Referring Provider (PT): Romualdo Bolk Cornetto    Encounter Date: 07/28/2018  PT End of Session - 07/28/18 1158    Visit Number  5    Number of Visits  19    Date for PT Re-Evaluation  08/29/18    PT Start Time  1100    PT Stop Time  1150    PT Time Calculation (min)  50 min    Activity Tolerance  Patient tolerated treatment well    Behavior During Therapy  [facility] for tasks assessed/performed       Past Medical History:  Diagnosis Date  . Arthritis   . Asthma    rarely uses neb  . Breast cancer ([place]) 05/25/2016   right breast  . Breast cancer of lower-inner quadrant of right female breast ([place]) 01/22/2016  . Carpal tunnel syndrome   . Coronary artery disease    DES to OM3 90% stenosis 2003, 90% small RCA stenosis.    . Diabetes mellitus    not taken glipizide in over a year, checks surgars daily  . History of stomach ulcers   . Hyperlipemia   . Hypertension   . Irregular heart beat     Past Surgical History:  Procedure Laterality Date  . ABDOMINAL HYSTERECTOMY    . CARDIAC CATHETERIZATION  2003   stent-  . CARPAL TUNNEL RELEASE  03/29/2012   Procedure: CARPAL TUNNEL RELEASE;  Surgeon: Wynonia Sours, MD;  Location: [place];  Service: Orthopedics;  Laterality: Left;  . EYE SURGERY     catracts  . LESION EXCISION WITH COMPLEX REPAIR Right 05/25/2016   Procedure: complex repair of 25cm wound;  Surgeon: Irene Limbo, MD;  Location: [place];  Service: Plastics;  Laterality: Right;  . MASTECTOMY     right  . MASTECTOMY MODIFIED RADICAL Right 05/25/2016   Procedure: RIGHT MASTECTOMY MODIFIED RADICAL;  Surgeon: Fanny Skates, MD;  Location: [place];  Service: General;  Laterality: Right;  . PUNCH BIOPSY  OF SKIN Left 04/04/2017   Procedure: PUNCH BIOPSY OF LEFT BREAST SKIN;  Surgeon: Fanny Skates, MD;  Location: [place];  Service: General;  Laterality: Left;  . RADIOACTIVE SEED GUIDED AXILLARY SENTINEL LYMPH NODE Left 04/04/2017   Procedure: RADIOACTIVE SEED TARGETED LEFT AXILLARY LYMPH NODE EXCISION;  Surgeon: Fanny Skates, MD;  Location: [place];  Service: General;  Laterality: Left;  . SHOULDER ARTHROSCOPY     right and left  . TRIGGER FINGER RELEASE  03/29/2012   Procedure: RELEASE TRIGGER FINGER/A-1 PULLEY;  Surgeon: Wynonia Sours, MD;  Location: [place];  Service: Orthopedics;  Laterality: Left;    There were no vitals filed for this visit.  Subjective Assessment - 07/28/18 1155    Subjective  nothing to report. 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  No/denies            LYMPHEDEMA/ONCOLOGY QUESTIONNAIRE - 07/28/18 1109      Right Upper Extremity Lymphedema   15 cm Proximal to Olecranon Process  37.1 cm    10 cm Proximal to Olecranon Process  37 cm    Olecranon Process  31 cm    10 cm Proximal to Ulnar Styloid Process  23.5 cm    Just Proximal to Ulnar Styloid Process  16.8 cm    Across Hand at [employer]  20.5 cm    At [place] of 2nd Digit  6.2 cm           Outpatient Re[facility] from [facility]  Lymphedema Life Impact Scale Total Score  8.82 %           [facility] Adult PT Treatment/Exercise - 07/28/18 0001      Manual Therapy   Manual therapy comments  unwrapped and  rerolled bandages pt washed arm and applied lotion. Remeasured.  Attempted to wear tribute night fitting everywhere except the top velcro.  Will wrap x 1 more week hopefully     Manual Lymphatic Drainage (MLD)  short neck, Lt axillary nodes and interaxillary pathway, Rt inguinal nodes and axillo inguinal pathway, work on the Rt UE from proximal to distal and then reversing steps    Compression Bandaging  lotion applied, thick stockinette, [place] to fingers 1-4 with paper tape; artiflex from hand to axilla, 1-6cm, 1-8cm and 2-10cm bandages from hand to axilla.  Pt verifying that it was not too tight and could move her hand and elbow               PT Short Term Goals - 06/28/18 1857      PT SHORT TERM GOAL #1   Title  Pt will have reduction of right arm at 10 cm proximal to the olecranon by 2 cm        PT Long Term Goals - 07/26/18 1519      PT LONG TERM GOAL #1   Title  Pt will have reduction on right upper arm at 10 cm proximal to the olecranon by 3 cm    Baseline  42 cm on eval ; 37.4 cm today (4.6 cm reduction)-07/26/18    Status  Achieved      PT LONG TERM GOAL #2   Title  Son with be independent in donning and doffing day and nighttime compression garments for pt    Baseline  Daughter is to bring newest compression garment(s) to next visit for assess of effectiveness and fit-07/26/18    Status  On-going      PT LONG TERM GOAL #3   Title  Pt will have appropriate day and nighttime compression garments    Baseline  Daughter to bring to next session to assess-07/26/18    Status  On-going            Plan - 07/28/18 1158    Clinical Impression Statement  Pt still showing good reduction.  Current tribute almost fitting but still a little snug at the top velcro.  Will keep peforming CDT until this fits a bit better or teach pt how towear with an under layer.  Pt reports bandages feel fine upon leaving.  PT emailed Lenna Sciara and Colbert Coyer regarding garments.  Was she suppsed  to have gotten a day time sleeve as she has a tribute and 2 gloves  PT Frequency  3x / week    PT Duration  8 weeks    PT Treatment/Interventions  Compression bandaging;Manual lymph drainage;Manual techniques;Patient/family education;DME Instruction;Therapeutic exercise    PT Next Visit Plan  Cont complete CDT to Rt UE. arrange for new daytime garments, see if current Tribute night will fit or need to be adjusted        Patient will benefit from skilled therapeutic intervention in order to improve the following deficits and impairments:  Decreased skin integrity, Increased edema  Visit Diagnosis: Postmastectomy lymphedema  Muscle weakness (generalized)  Stiffness of right shoulder, not elsewhere classified  Aftercare following surgery for neoplasm  Abnormal posture     Problem List Patient Active Problem List   Diagnosis Date Noted  . 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 Preop cardiovascular exam 03/18/2017  . Essential hypertension 03/18/2017  . Dyslipidemia 03/18/2017  . Coronary artery disease involving native coronary artery of native heart without angina pectoris 03/18/2017  . Bruit 03/18/2017  . Lung metastases ([place]) 06/23/2016  . Malignant neoplasm of lower-inner quadrant of right breast of female, estrogen receptor negative ([place]) 01/22/2016    Shan Levans, PT 07/28/2018, 12:01 PM  [name], [place], [address] Phone: [phone]   Fax:  [phone]  Name: Jennifer Garrett MRN: [id number] Date of Birth: Nov 21, 1931

## 2018-07-31 ENCOUNTER — Ambulatory Visit: Payer: Medicare Other | Admitting: Rehabilitation

## 2018-07-31 ENCOUNTER — Encounter: Payer: Self-pay | Admitting: Rehabilitation

## 2018-07-31 DIAGNOSIS — R293 Abnormal posture: Secondary | ICD-10-CM

## 2018-07-31 DIAGNOSIS — I972 Postmastectomy lymphedema syndrome: Secondary | ICD-10-CM | POA: Diagnosis not present

## 2018-07-31 DIAGNOSIS — M25611 Stiffness of right shoulder, not elsewhere classified: Secondary | ICD-10-CM

## 2018-07-31 DIAGNOSIS — M6281 Muscle weakness (generalized): Secondary | ICD-10-CM

## 2018-07-31 DIAGNOSIS — Z483 Aftercare following surgery for neoplasm: Secondary | ICD-10-CM

## 2018-07-31 NOTE — Patient Instructions (Signed)
Use of tribute night during the day and night

## 2018-07-31 NOTE — Therapy (Addendum)
[place], [place], [address] Phone: [phone]   Fax:  [phone]  Physical Therapy Treatment  Patient Details  Name: Jennifer Garrett MRN: [id number] Date of Birth: 11/24/1931 Referring Provider (PT): Romualdo Bolk Cornetto    Encounter Date: 07/31/2018  PT End of Session - 07/31/18 1605    Visit Number  6    Number of Visits  19    Date for PT Re-Evaluation  08/29/18    PT Start Time  1316   arriving late   PT Stop Time  1345    PT Time Calculation (min)  29 min    Activity Tolerance  Patient tolerated treatment well    Behavior During Therapy  [facility] for tasks assessed/performed       Past Medical History:  Diagnosis Date  . Arthritis   . Asthma    rarely uses neb  . Breast cancer ([place]) 05/25/2016   right breast  . Breast cancer of lower-inner quadrant of right female breast ([place]) 01/22/2016  . Carpal tunnel syndrome   . Coronary artery disease    DES to OM3 90% stenosis 2003, 90% small RCA stenosis.    . Diabetes mellitus    not taken glipizide in over a year, checks surgars daily  . History of stomach ulcers   . Hyperlipemia   . Hypertension   . Irregular heart beat     Past Surgical History:  Procedure Laterality Date  . ABDOMINAL HYSTERECTOMY    . CARDIAC CATHETERIZATION  2003   stent-  . CARPAL TUNNEL RELEASE  03/29/2012   Procedure: CARPAL TUNNEL RELEASE;  Surgeon: Wynonia Sours, MD;  Location: [place];  Service: Orthopedics;  Laterality: Left;  . EYE SURGERY     catracts  . LESION EXCISION WITH COMPLEX REPAIR Right 05/25/2016   Procedure: complex repair of 25cm wound;  Surgeon: Irene Limbo, MD;  Location: [place];  Service: Plastics;  Laterality: Right;  . MASTECTOMY     right  . MASTECTOMY MODIFIED RADICAL Right 05/25/2016   Procedure: RIGHT MASTECTOMY MODIFIED RADICAL;  Surgeon: Fanny Skates, MD;  Location: [place];  Service: General;  Laterality: Right;   . PUNCH BIOPSY OF SKIN Left 04/04/2017   Procedure: PUNCH BIOPSY OF LEFT BREAST SKIN;  Surgeon: Fanny Skates, MD;  Location: [place];  Service: General;  Laterality: Left;  . RADIOACTIVE SEED GUIDED AXILLARY SENTINEL LYMPH NODE Left 04/04/2017   Procedure: RADIOACTIVE SEED TARGETED LEFT AXILLARY LYMPH NODE EXCISION;  Surgeon: Fanny Skates, MD;  Location: [place];  Service: General;  Laterality: Left;  . SHOULDER ARTHROSCOPY     right and left  . TRIGGER FINGER RELEASE  03/29/2012   Procedure: RELEASE TRIGGER FINGER/A-1 PULLEY;  Surgeon: Wynonia Sours, MD;  Location: [place];  Service: Orthopedics;  Laterality: Left;    There were no vitals filed for this visit.  Subjective Assessment - 07/31/18 1320    Subjective  Pts daughter states that they never recieved any daytime garments despite contacting Melissa saying they should have.  Daughter okay with being done with night garment for daily use for now     Pertinent History  2 strokes, poor recall and memory, bilateral triple negative breast cancer with mets to the lungs treated with Rt modified radical mastectomy and ALND of 21 nodes, Lt lumpectomy with removal of 2 nodes, bilateral radiation and chemotherapy    Patient Stated Goals  family goal is to get the swelling under control and to get the right garment     Currently in Pain?  No/denies            LYMPHEDEMA/ONCOLOGY QUESTIONNAIRE - 07/31/18 1330      Right Upper Extremity Lymphedema   15 cm Proximal to Olecranon Process  36.4 cm    10 cm Proximal to Olecranon Process  36 cm    Olecranon Process  31 cm    10 cm Proximal to Ulnar Styloid Process  23.3 cm    Just Proximal to Ulnar Styloid Process  16.5 cm    Across Hand at [employer]  20.3 cm    At [place] of 2nd Digit  6.5 cm           Outpatient Re[facility] from [facility]  Lymphedema Life Impact Scale Total Score   8.82 %           [facility] Adult PT Treatment/Exercise - 07/31/18 0001      Manual Therapy   Manual therapy comments  unwrapped and remeasured patient    Compression Bandaging  donned tribute night and taught daughter how to donn and wear instruction.  Due to difficulty getting mom here they will be ind with tribute night use and Colbert Coyer emailed PT back stating that she will look into the garment situation               PT Short Term Goals - 06/28/18 1857      PT SHORT TERM GOAL #1   Title  Pt will have reduction of right arm at 10 cm proximal to the olecranon by 2 cm        PT Long Term Goals - 07/31/18 1608      PT LONG TERM GOAL #1   Title  Pt will have reduction on right upper arm at 10 cm proximal to the olecranon by 3 cm    Status  Achieved      PT LONG TERM GOAL #2   Title  Son with be independent in donning and doffing day and nighttime compression garments for pt    Status  Partially Met      PT LONG TERM GOAL #3   Title  Pt will have appropriate day and nighttime compression garments    Status  Partially Met      PT LONG TERM GOAL #4   Title  pt will be independent with remedial exercises to improve lymphatic flow    Status  Achieved            Plan - 07/31/18 1606    Clinical Impression Statement  Pt showing more upper arm reduction today and able to get the tribute night on without velcro touching the skin.  Also got an email back from Colbert Coyer stating that she will look into the missing sleeves.  Pt will use tribute night at home with daughters help as they feel ok with this, Jaina is mostly sedentary during the day.      PT Frequency  3x / week    PT Duration  8 weeks    PT Treatment/Interventions  Compression bandaging;Manual lymph drainage;Manual techniques;Patient/family education;DME Instruction;Therapeutic exercise    PT Next Visit Plan  if pt returns reassess use of garment/arm, any day garment update    Consulted and Agree with Plan  of Care  Patient;Family member/caregiver    Family Member Consulted  daughter  Patient will benefit from skilled therapeutic intervention in order to improve the following deficits and impairments:  Decreased skin integrity, Increased edema  Visit Diagnosis: Postmastectomy lymphedema  Muscle weakness (generalized)  Stiffness of right shoulder, not elsewhere classified  Aftercare following surgery for neoplasm  Abnormal posture     Problem List Patient Active Problem List   Diagnosis Date Noted  . 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 Preop cardiovascular exam 03/18/2017  . Essential hypertension 03/18/2017  . Dyslipidemia 03/18/2017  . Coronary artery disease involving native coronary artery of native heart without angina pectoris 03/18/2017  . Bruit 03/18/2017  . Lung metastases ([place]) 06/23/2016  . Malignant neoplasm of lower-inner quadrant of right breast of female, estrogen receptor negative ([place]) 01/22/2016    Shan Levans, PT 07/31/2018, 4:09 PM  [place], [place], [address] Phone: [phone]   Fax:  [phone]  Name: Jennifer Garrett MRN: [id number] Date of Birth: July 30, 1931  PHYSICAL THERAPY DISCHARGE SUMMARY  Visits from Start of Care: 6  Current functional level related to goals / functional outcomes: See above.  Garment situation has been resolved from [employer] per emails   Remaining deficits: compliance   Education / Equipment: Self care Plan: Patient agrees to discharge.  Patient goals were partially met. Patient is being discharged due to being pleased with the current functional level.  ?????    Shan Levans, PT 10/02/18

## 2018-08-02 ENCOUNTER — Encounter: Payer: Medicare Other | Admitting: Physical Therapy

## 2018-08-04 ENCOUNTER — Encounter: Payer: Medicare Other | Admitting: Physical Therapy

## 2018-08-07 ENCOUNTER — Encounter: Payer: Medicare Other | Admitting: Rehabilitation

## 2018-08-09 ENCOUNTER — Encounter: Payer: Medicare Other | Admitting: Rehabilitation

## 2018-08-11 ENCOUNTER — Encounter: Payer: Medicare Other | Admitting: Physical Therapy

## 2018-08-14 ENCOUNTER — Encounter: Payer: Medicare Other | Admitting: Rehabilitation

## 2018-08-18 ENCOUNTER — Encounter: Payer: Medicare Other | Admitting: Physical Therapy

## 2018-12-27 NOTE — Progress Notes (Signed)
[facility]  [phone] [phone]     ID: Jennifer Garrett DOB: 25-Sep-1931  MR#: [id number]  IWO#:[id number]  Patient Care Team: Nolene Ebbs, MD as PCP - General (Internal Medicine) Fanny Skates, MD as Consulting Physician (General Surgery) Chisom Muntean, Virgie Dad, MD as Consulting Physician (Oncology) Minus Breeding, MD as Consulting Physician (Cardiology) Irene Limbo, MD as Consulting Physician (Plastic Surgery) OTHER MD:  I connected with Scherry Ran on 12/28/18 at  9:00 AM EDT by telephone visit and verified that I am speaking with the correct person using two identifiers.   I discussed the limitations, risks, security and privacy concerns of performing an evaluation and management service by telemedicine and the availability of in-person appointments. I also discussed with the patient that there may be a patient responsible charge related to this service. The patient expressed understanding and agreed to proceed.   Other persons participating in the visit and their role in the encounter: Wilburn Mylar, scribe; patient's daughter  Patient's location: home  Provider's location: [place]: Locally advanced triple negative breast cancer  CURRENT TREATMENT: Observation   INTERVAL HISTORY:  Jennifer Garrett was called today for follow-up of her triple negative breast cancer. She is under observation.  Since her last visit, she underwent restaging chest CT on 07/25/2018. The exam was stable with no new or progressive findings.   REVIEW OF SYSTEMS: Jennifer Garrett reports some swelling in her right hand and her right breast, but she denies any redness and any pain.  She tells me that she has no pain anywhere.  She has not noted any change in her right chest wall and she has not noted any change in the left breast although she says sometimes the left breast seems to be a little bit swollen but then it "goes down" during the  day.  She lives with her daughter Jennifer Garrett, who keeps Jennifer Garrett at home and does the shopping.  The patient takes care of her own activities of daily living, doing her own bathing and dressing, then does a little bit of housework and mostly watches TV during the day.  A detailed review of systems was otherwise entirely negative.   BREAST CANCER HISTORY: From the original intake note:  The patient presented to the emergency room 11/20/2015 with a complaint of a mass in the lower aspect of her right breast, which was painful and bleeding. The patient stated the mass had been present approximately a month. An ultrasound of the breast was obtained which found a 4.4 cm mass in the right breast at the 6:00 position. This was read as most consistent with an abscess. The patient was started on clindamycin and referred to the [facility], where on 01/05/2016 she underwent bilateral diagnostic mammography with tomography and right  ultrasonography.   Mammography showed far posteriorly in the 5:00 region of the right breast a 4.1 cm mass with enlarged axillary adenopathy. On exam there was a fungating ulcerated bleeding mass in the right breast at the 5:00 position 7 cm from the nipple. The right axilla was "full" on palpation. Ultrasound showed a 5.4 cm mass at the 5:00 position of the right breast 7 cm from the nipple, with at least 3 enlarged abnormal axillary lymph nodes, the largest measuring 3.7 cm.  Biopsy of the right breast mass and one axillary lymph node on 01/06/2016 showed ([id number]) both to be positive for an invasive ductal carcinoma, grade 3, triple negative, with  the HER-2 signals ratio between 1.21-1.40 and the number per cell 2.85-3.00. The MIB-1-2 was 70%.  The patient's subsequent history is as detailed below   PAST MEDICAL HISTORY: Past Medical History:  Diagnosis Date  . Arthritis   . Asthma    rarely uses neb  . Breast cancer ([facility]) 05/25/2016   right breast  . Breast cancer of  lower-inner quadrant of right female breast ([facility]) 01/22/2016  . Carpal tunnel syndrome   . Coronary artery disease    DES to OM3 90% stenosis 2003, 90% small RCA stenosis.    . Diabetes mellitus    not taken glipizide in over a year, checks surgars daily  . History of stomach ulcers   . Hyperlipemia   . Hypertension   . Irregular heart beat     PAST SURGICAL HISTORY: Past Surgical History:  Procedure Laterality Date  . ABDOMINAL HYSTERECTOMY    . CARDIAC CATHETERIZATION  2003   stent-  . CARPAL TUNNEL RELEASE  03/29/2012   Procedure: CARPAL TUNNEL RELEASE;  Surgeon: Wynonia Sours, MD;  Location: [facility];  Service: Orthopedics;  Laterality: Left;  . EYE SURGERY     catracts  . LESION EXCISION WITH COMPLEX REPAIR Right 05/25/2016   Procedure: complex repair of 25cm wound;  Surgeon: Irene Limbo, MD;  Location: [facility];  Service: Plastics;  Laterality: Right;  . MASTECTOMY     right  . MASTECTOMY MODIFIED RADICAL Right 05/25/2016   Procedure: RIGHT MASTECTOMY MODIFIED RADICAL;  Surgeon: Fanny Skates, MD;  Location: [facility];  Service: General;  Laterality: Right;  . PUNCH BIOPSY OF SKIN Left 04/04/2017   Procedure: PUNCH BIOPSY OF LEFT BREAST SKIN;  Surgeon: Fanny Skates, MD;  Location: [facility];  Service: General;  Laterality: Left;  . RADIOACTIVE SEED GUIDED AXILLARY SENTINEL LYMPH NODE Left 04/04/2017   Procedure: RADIOACTIVE SEED TARGETED LEFT AXILLARY LYMPH NODE EXCISION;  Surgeon: Fanny Skates, MD;  Location: [facility];  Service: General;  Laterality: Left;  . SHOULDER ARTHROSCOPY     right and left  . TRIGGER FINGER RELEASE  03/29/2012   Procedure: RELEASE TRIGGER FINGER/A-1 PULLEY;  Surgeon: Wynonia Sours, MD;  Location: [facility];  Service: Orthopedics;  Laterality: Left;    FAMILY HISTORY Family History  Problem Relation Age of Onset  . Heart disease Mother        No details   The patient's father died  from "old age" at age 81. The patient's mother died at age 75 from complications of high blood pressure and diabetes. The patient had one brother, 4 sisters. There is no cancer in the family to her knowledge    GYNECOLOGIC HISTORY:  No LMP recorded. Patient has had a hysterectomy.  menarche age 63, first live birth age 18. The patient is GX P2. She underwent abdominal hysterectomy with bilateral salpingo-oophorectomy in her 26s. She did not undergo hormone replacement   SOCIAL HISTORY:  Jennifer Garrett used to work for [employer]. She lives with her daughter Jennifer Garrett, who is Medical sales representative for [employer]. The patient's son Jennifer Garrett lives in [place]. The patient has 1 grandchild, no great-grandchildren. She attends a local [place] DIRECTIVES:  not in place. At the 01/22/2016 visit the patient was given the appropriate documents to complete. She tells me she intends to name her daughter Jennifer Garrett as her healthcare part of attorney    HEALTH MAINTENANCE: Social  History   Tobacco Use  . Smoking status: Former Smoker    Quit date: 03/24/1978    Years since quitting: 40.7  . Smokeless tobacco: Never Used  Substance Use Topics  . Alcohol use: No  . Drug use: No     Colonoscopy: Never   PAP: status post hysterectomy   Bone density: 09/06/2017 with a T-score of -0.2   Allergies  Allergen Reactions  . Lyrica [Pregabalin] Swelling    SWELLING REACTION UNSPECIFIED   . Feldene [Piroxicam] Rash  . Lisinopril Other (See Comments)    States that it makes her "sensitive to light" unknown  . Orudis [Ketoprofen] Rash  . Vibramycin [Doxycycline Calcium] Rash and Hives    Current Outpatient Medications  Medication Sig Dispense Refill  . albuterol (PROVENTIL HFA;VENTOLIN HFA) 108 (90 BASE) MCG/ACT inhaler Inhale 1 puff into the lungs every 6 (six) hours as needed for wheezing or shortness of breath. Reported on 02/04/2016    . aspirin EC 81 MG tablet  Take 1 tablet by mouth every morning.    Marland Kitchen atorvastatin (LIPITOR) 40 MG tablet TAKE 1 TABLET (40 MG TOTAL) BY MOUTH DAILY. 90 tablet 3  . calcium carbonate (OS-CAL) 600 MG TABS Take 600 mg by mouth 2 (two) times daily with a meal.    . diclofenac sodium (VOLTAREN) 1 % GEL Apply 1 application topically every other day.    . donepezil (ARICEPT) 5 MG tablet Take 5 mg by mouth at bedtime.    . ferrous sulfate 325 (65 FE) MG tablet Take 1 tablet (325 mg total) by mouth 2 (two) times daily with a meal. 60 tablet 3  . hydrochlorothiazide (MICROZIDE) 12.5 MG capsule Take 12.5 mg by mouth daily.    Marland Kitchen olmesartan (BENICAR) 40 MG tablet Take 40 mg by mouth daily.     No current facility-administered medications for this visit.     OBJECTIVE: Older African-American woman in no acute distress There were no vitals filed for this visit.   There is no height or weight on file to calculate BMI.   ECOG FS:2    LAB RESULTS:  CMP     Component Value Date/Time   NA 143 06/23/2018 1051   NA 143 05/04/2017 1418   K 4.9 06/23/2018 1051   K 4.2 05/04/2017 1418   CL 108 06/23/2018 1051   CO2 24 06/23/2018 1051   CO2 28 05/04/2017 1418   GLUCOSE 118 (H) 06/23/2018 1051   GLUCOSE 125 05/04/2017 1418   BUN 17 06/23/2018 1051   BUN 22.7 05/04/2017 1418   CREATININE 1.37 (H) 06/23/2018 1051   CREATININE 1.1 05/04/2017 1418   CALCIUM 9.9 06/23/2018 1051   CALCIUM 9.3 05/04/2017 1418   PROT 7.5 06/23/2018 1051   PROT 7.2 05/04/2017 1418   ALBUMIN 3.6 06/23/2018 1051   ALBUMIN 3.3 (L) 05/04/2017 1418   AST 16 06/23/2018 1051   AST 20 05/04/2017 1418   ALT 7 06/23/2018 1051   ALT 11 05/04/2017 1418   ALKPHOS 74 06/23/2018 1051   ALKPHOS 67 05/04/2017 1418   BILITOT 0.3 06/23/2018 1051   BILITOT 0.32 05/04/2017 1418   GFRNONAA 35 (L) 06/23/2018 1051   GFRAA 41 (L) 06/23/2018 1051    INo results found for: SPEP, UPEP  Lab Results  Component Value Date   WBC 4.7 06/23/2018   NEUTROABS 2.6  06/23/2018   HGB 11.3 (L) 06/23/2018   HCT 36.5 06/23/2018   MCV 90.1 06/23/2018   PLT 232 06/23/2018  Chemistry      Component Value Date/Time   NA 143 06/23/2018 1051   NA 143 05/04/2017 1418   K 4.9 06/23/2018 1051   K 4.2 05/04/2017 1418   CL 108 06/23/2018 1051   CO2 24 06/23/2018 1051   CO2 28 05/04/2017 1418   BUN 17 06/23/2018 1051   BUN 22.7 05/04/2017 1418   CREATININE 1.37 (H) 06/23/2018 1051   CREATININE 1.1 05/04/2017 1418      Component Value Date/Time   CALCIUM 9.9 06/23/2018 1051   CALCIUM 9.3 05/04/2017 1418   ALKPHOS 74 06/23/2018 1051   ALKPHOS 67 05/04/2017 1418   AST 16 06/23/2018 1051   AST 20 05/04/2017 1418   ALT 7 06/23/2018 1051   ALT 11 05/04/2017 1418   BILITOT 0.3 06/23/2018 1051   BILITOT 0.32 05/04/2017 1418       No results found for: LABCA2  No components found for: LABCA125  No results for input(s): INR in the last 168 hours.  Urinalysis    Component Value Date/Time   COLORURINE YELLOW 05/19/2016 1138   APPEARANCEUR CLEAR 05/19/2016 1138   LABSPEC 1.017 05/19/2016 1138   PHURINE 5.5 05/19/2016 1138   GLUCOSEU NEGATIVE 05/19/2016 1138   HGBUR NEGATIVE 05/19/2016 1138   BILIRUBINUR NEGATIVE 05/19/2016 1138   KETONESUR NEGATIVE 05/19/2016 1138   PROTEINUR NEGATIVE 05/19/2016 1138   UROBILINOGEN 1.0 11/28/2013 0143   NITRITE NEGATIVE 05/19/2016 1138   LEUKOCYTESUR NEGATIVE 05/19/2016 1138     STUDIES: No results found.  ASSESSMENT: 85 y.o. [place] woman status post right breast lower outer quadrant and right axillary lymph node biopsy 01/06/2016 both positive for a clinical T4 N1 M1, stage IV invasive ductal carcinoma, grade 3, triple negative, with an MIB-1 of 70%  (a) CT scan of the chest and bone scan 01/27/2016 showed scattered pulmonary nodules in addition to locally advanced disease  (b) repeat CT scan of the chest 04/12/2016 shows persistent pulmonary nodules, progression in local disease  (c) CA-27-29 is  noninformative  (1) neoadjuvant chemotherapy consisting of capecitabine, 1500 mg twice daily, 7 days on 7 days off, starting 01/27/2016, discontinued 04/14/2016 with progression  (2) right modified radical mastectomy 05/25/2016 showed a pT3 pN2, stage IIIA invasive ductal carcinoma, grade 3, with negative margins.  (3) adjuvant radiation 08/24/16- 10/07/16 Site/dose:   1) Right chest wall / 50.4 Gy in 28 fractions                         2) Right chest wall boost / 10 Gy in 5 fractions  (4) restaging studies and follow-up:  (a) PET scan 08/24/2016 was negative except for a 1.0 cm LEFT axillary lymph node  (b)  biopsy of the left axillary lymph node 02/22/2017 showed breast carcinoma, again triple negative  (c) CT chest on 10/27/2017 showed: No evidence of breast cancer recurrence.   (d) CT chest on 05/04/2018 showed: NED  (5) on 04/04/2017 she underwent removal of the left axillary mass (measuring 4.8 cm,) together with 2 benign lymph nodes.  (a) skin punch biopsy on the same day was benign  (6) adjuvant radiation to the left axillary tumor bed completed 07/01/2017:  Site/dose:   Left axilla / 50 Gy in 25 fy  PLAN: Jennifer Garrett is now 2-1/2 years out from definitive surgery for her breast cancer, with no evidence of disease recurrence.  This is favorable.  Normally of course I would have done a visit in person, lab work,  and we would consider left mammography and a repeat chest CT scan.  However that it is not safe for the patient to come to the office given the current pandemic.  Accordingly she will see me again in October.  I am hopeful by then things will have cleared up sufficiently regarding the coronavirus that it would be safe for her to come see me in person.  At that point we will discuss mammography and further studies as needed  She does have a sleeve for her right arm edema and she does appear to be wearing it.  They know to call for any other issue that may develop before the  next visit. Virgie Dad. Jennifer Diluzio, MD 12/28/18 8:54 AM Medical Oncology and Hematology [facility] [address] [place], [place] [address] Tel. [phone]    Fax. [phone]   I, Wilburn Mylar, am acting as scribe for Dr. Virgie Dad. Jennifer Garrett.  I, Lurline Del MD, have reviewed the above documentation for accuracy and completeness, and I agree with the above.

## 2018-12-28 ENCOUNTER — Inpatient Hospital Stay: Payer: Medicare Other | Attending: Internal Medicine

## 2018-12-28 ENCOUNTER — Inpatient Hospital Stay (HOSPITAL_BASED_OUTPATIENT_CLINIC_OR_DEPARTMENT_OTHER): Payer: Medicare Other | Admitting: Oncology

## 2018-12-28 DIAGNOSIS — Z171 Estrogen receptor negative status [ER-]: Secondary | ICD-10-CM

## 2018-12-28 DIAGNOSIS — Z87891 Personal history of nicotine dependence: Secondary | ICD-10-CM

## 2018-12-28 DIAGNOSIS — C773 Secondary and unspecified malignant neoplasm of axilla and upper limb lymph nodes: Secondary | ICD-10-CM | POA: Diagnosis not present

## 2018-12-28 DIAGNOSIS — C50311 Malignant neoplasm of lower-inner quadrant of right female breast: Secondary | ICD-10-CM | POA: Diagnosis not present

## 2018-12-28 DIAGNOSIS — C7800 Secondary malignant neoplasm of unspecified lung: Secondary | ICD-10-CM | POA: Diagnosis not present

## 2018-12-28 DIAGNOSIS — Z7982 Long term (current) use of aspirin: Secondary | ICD-10-CM

## 2018-12-28 DIAGNOSIS — Z79899 Other long term (current) drug therapy: Secondary | ICD-10-CM

## 2019-09-28 ENCOUNTER — Emergency Department (HOSPITAL_COMMUNITY)
Admission: EM | Admit: 2019-09-28 | Discharge: 2019-09-29 | Disposition: A | Discharge status: Skilled Nursing Facility | Payer: Medicare Other | Attending: Emergency Medicine | Admitting: Emergency Medicine

## 2019-09-28 ENCOUNTER — Encounter (HOSPITAL_COMMUNITY): Payer: Self-pay

## 2019-09-28 ENCOUNTER — Emergency Department (HOSPITAL_COMMUNITY): Payer: Medicare Other

## 2019-09-28 DIAGNOSIS — Z79899 Other long term (current) drug therapy: Secondary | ICD-10-CM | POA: Diagnosis not present

## 2019-09-28 DIAGNOSIS — R195 Other fecal abnormalities: Secondary | ICD-10-CM | POA: Insufficient documentation

## 2019-09-28 DIAGNOSIS — K529 Noninfective gastroenteritis and colitis, unspecified: Secondary | ICD-10-CM | POA: Diagnosis not present

## 2019-09-28 DIAGNOSIS — I2510 Atherosclerotic heart disease of native coronary artery without angina pectoris: Secondary | ICD-10-CM | POA: Insufficient documentation

## 2019-09-28 DIAGNOSIS — Z20822 Contact with and (suspected) exposure to covid-19: Secondary | ICD-10-CM | POA: Diagnosis not present

## 2019-09-28 DIAGNOSIS — R197 Diarrhea, unspecified: Secondary | ICD-10-CM

## 2019-09-28 DIAGNOSIS — I119 Hypertensive heart disease without heart failure: Secondary | ICD-10-CM | POA: Insufficient documentation

## 2019-09-28 DIAGNOSIS — F0390 Unspecified dementia without behavioral disturbance: Secondary | ICD-10-CM | POA: Insufficient documentation

## 2019-09-28 DIAGNOSIS — Z8711 Personal history of peptic ulcer disease: Secondary | ICD-10-CM | POA: Insufficient documentation

## 2019-09-28 DIAGNOSIS — R112 Nausea with vomiting, unspecified: Secondary | ICD-10-CM | POA: Diagnosis present

## 2019-09-28 LAB — LACTIC ACID, PLASMA: Lactic Acid, Venous: 1.2 mmol/L (ref 0.5–1.9)

## 2019-09-28 LAB — RESPIRATORY PANEL BY RT PCR (FLU A&B, COVID)
Influenza A by PCR: NEGATIVE
Influenza B by PCR: NEGATIVE
SARS Coronavirus 2 by RT PCR: NEGATIVE

## 2019-09-28 LAB — CBC WITH DIFFERENTIAL/PLATELET
Abs Immature Granulocytes: 0.02 10*3/uL (ref 0.00–0.07)
Basophils Absolute: 0 10*3/uL (ref 0.0–0.1)
Basophils Relative: 1 %
Eosinophils Absolute: 0.1 10*3/uL (ref 0.0–0.5)
Eosinophils Relative: 2 %
HCT: 33.4 % — ABNORMAL LOW (ref 36.0–46.0)
Hemoglobin: 10 g/dL — ABNORMAL LOW (ref 12.0–15.0)
Immature Granulocytes: 0 %
Lymphocytes Relative: 16 %
Lymphs Abs: 1 10*3/uL (ref 0.7–4.0)
MCH: 28.4 pg (ref 26.0–34.0)
MCHC: 29.9 g/dL — ABNORMAL LOW (ref 30.0–36.0)
MCV: 94.9 fL (ref 80.0–100.0)
Monocytes Absolute: 0.7 10*3/uL (ref 0.1–1.0)
Monocytes Relative: 12 %
Neutro Abs: 4.3 10*3/uL (ref 1.7–7.7)
Neutrophils Relative %: 69 %
Platelets: 210 10*3/uL (ref 150–400)
RBC: 3.52 MIL/uL — ABNORMAL LOW (ref 3.87–5.11)
RDW: 12.7 % (ref 11.5–15.5)
WBC: 6.1 10*3/uL (ref 4.0–10.5)
nRBC: 0 % (ref 0.0–0.2)

## 2019-09-28 LAB — COMPREHENSIVE METABOLIC PANEL
ALT: 11 U/L (ref 0–44)
AST: 22 U/L (ref 15–41)
Albumin: 3.6 g/dL (ref 3.5–5.0)
Alkaline Phosphatase: 51 U/L (ref 38–126)
Anion gap: 10 (ref 5–15)
BUN: 34 mg/dL — ABNORMAL HIGH (ref 8–23)
CO2: 23 mmol/L (ref 22–32)
Calcium: 9 mg/dL (ref 8.9–10.3)
Chloride: 108 mmol/L (ref 98–111)
Creatinine, Ser: 1.28 mg/dL — ABNORMAL HIGH (ref 0.44–1.00)
GFR calc Af Amer: 44 mL/min — ABNORMAL LOW (ref >60)
GFR calc non Af Amer: 38 mL/min — ABNORMAL LOW (ref >60)
Glucose, Bld: 100 mg/dL — ABNORMAL HIGH (ref 70–99)
Potassium: 5.3 mmol/L — ABNORMAL HIGH (ref 3.5–5.1)
Sodium: 141 mmol/L (ref 135–145)
Total Bilirubin: 0.2 mg/dL — ABNORMAL LOW (ref 0.3–1.2)
Total Protein: 7.1 g/dL (ref 6.5–8.1)

## 2019-09-28 LAB — POC OCCULT BLOOD, ED: Fecal Occult Bld: NEGATIVE

## 2019-09-28 LAB — TYPE AND SCREEN
ABO/RH(D): A POS
Antibody Screen: NEGATIVE

## 2019-09-28 MED ORDER — SODIUM CHLORIDE (PF) 0.9 % IJ SOLN
INTRAMUSCULAR | Status: AC
  Filled 2019-09-28: qty 50

## 2019-09-28 MED ORDER — IOHEXOL 300 MG/ML  SOLN
100.0000 mL | Freq: Once | INTRAMUSCULAR | Status: AC | PRN
  Administered 2019-09-28: 75 mL via INTRAVENOUS

## 2019-09-28 MED ORDER — SODIUM CHLORIDE 0.9 % IV BOLUS
500.0000 mL | Freq: Once | INTRAVENOUS | Status: AC
  Administered 2019-09-28: 500 mL via INTRAVENOUS

## 2019-09-28 MED ORDER — ONDANSETRON HCL 4 MG PO TABS: 4.0000 mg | ORAL_TABLET | Freq: Four times a day (QID) | ORAL | 0 refills | Status: DC

## 2019-09-28 MED ORDER — CIPROFLOXACIN IN D5W 400 MG/200ML IV SOLN
400.0000 mg | Freq: Once | INTRAVENOUS | Status: AC
  Administered 2019-09-28: 400 mg via INTRAVENOUS
  Filled 2019-09-28: qty 200

## 2019-09-28 MED ORDER — PANTOPRAZOLE SODIUM 40 MG IV SOLR
40.0000 mg | Freq: Once | INTRAVENOUS | Status: AC
  Administered 2019-09-28: 40 mg via INTRAVENOUS
  Filled 2019-09-28: qty 40

## 2019-09-28 MED ORDER — METRONIDAZOLE IN NACL 5-0.79 MG/ML-% IV SOLN: 500.0000 mg | Freq: Once | INTRAVENOUS | Status: DC

## 2019-09-28 MED ORDER — METRONIDAZOLE 500 MG PO TABS: 500.0000 mg | ORAL_TABLET | Freq: Two times a day (BID) | ORAL | 0 refills | Status: DC

## 2019-09-28 NOTE — ED Notes (Signed)
Swelling noted to pt's right arm. Pt had masectomy on that side. Pt unsure how long it has been swollen. Denies pain in the arm.

## 2019-09-28 NOTE — Discharge Instructions (Addendum)
Please return any problem.  Follow-up with your regular care provider as instructed.

## 2019-09-28 NOTE — ED Provider Notes (Signed)
Patient seen after prior ED provider patient with lengthy ED observation.    No further episodes of vomiting or diarrhea noted.  Patient appears to be comfortable.  She appears to be appropriate for discharge.  Patient will be discharged back to her facility.   Valarie Merino, MD 09/28/19 2146

## 2019-09-28 NOTE — ED Notes (Signed)
[facility] called  

## 2019-09-28 NOTE — ED Provider Notes (Signed)
[facility] DEPT Provider Note   CSN: [id number] Arrival date & time: 09/28/19  1342     History Chief Complaint  Patient presents with  . Diarrhea    Jennifer Garrett is a 86 y.o. female hx of breast cancer s/p R mastectomy, DM, HL, HTN, here with dark stool, vomiting.  Patient is from a nursing home and is demented at baseline.  Patient was noted to have some loose stools and nausea vomiting and diarrhea.  There was reported some dark stools per EMS.  Patient unable to give much history at all.  The history is provided by the nursing home and the EMS personnel.  Level V caveat- dementia      Past Medical History:  Diagnosis Date  . Arthritis   . Asthma    rarely uses neb  . Breast cancer ([facility]) 05/25/2016   right breast  . Breast cancer of lower-inner quadrant of right female breast ([facility]) 01/22/2016  . Carpal tunnel syndrome   . Coronary artery disease    DES to OM3 90% stenosis 2003, 90% small RCA stenosis.    . Diabetes mellitus    not taken glipizide in over a year, checks surgars daily  . History of stomach ulcers   . Hyperlipemia   . Hypertension   . Irregular heart beat     Patient Active Problem List   Diagnosis Date Noted  . 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 Preop cardiovascular exam 03/18/2017  . Essential hypertension 03/18/2017  . Dyslipidemia 03/18/2017  . Coronary artery disease involving native coronary artery of native heart without angina pectoris 03/18/2017  . Bruit 03/18/2017  . Lung metastases ([facility]) 06/23/2016  . Malignant neoplasm of lower-inner quadrant of right breast of female, estrogen receptor negative ([facility]) 01/22/2016    Past Surgical History:  Procedure Laterality Date  . ABDOMINAL HYSTERECTOMY    . CARDIAC CATHETERIZATION  2003   stent-  . CARPAL TUNNEL RELEASE  03/29/2012   Procedure: CARPAL TUNNEL RELEASE;  Surgeon: Wynonia Sours, MD;  Location: [facility];   Service: Orthopedics;  Laterality: Left;  . EYE SURGERY     catracts  . LESION EXCISION WITH COMPLEX REPAIR Right 05/25/2016   Procedure: complex repair of 25cm wound;  Surgeon: Irene Limbo, MD;  Location: [facility];  Service: Plastics;  Laterality: Right;  . MASTECTOMY     right  . MASTECTOMY MODIFIED RADICAL Right 05/25/2016   Procedure: RIGHT MASTECTOMY MODIFIED RADICAL;  Surgeon: Fanny Skates, MD;  Location: [facility];  Service: General;  Laterality: Right;  . PUNCH BIOPSY OF SKIN Left 04/04/2017   Procedure: PUNCH BIOPSY OF LEFT BREAST SKIN;  Surgeon: Fanny Skates, MD;  Location: [facility];  Service: General;  Laterality: Left;  . RADIOACTIVE SEED GUIDED AXILLARY SENTINEL LYMPH NODE Left 04/04/2017   Procedure: RADIOACTIVE SEED TARGETED LEFT AXILLARY LYMPH NODE EXCISION;  Surgeon: Fanny Skates, MD;  Location: [facility];  Service: General;  Laterality: Left;  . SHOULDER ARTHROSCOPY     right and left  . TRIGGER FINGER RELEASE  03/29/2012   Procedure: RELEASE TRIGGER FINGER/A-1 PULLEY;  Surgeon: Wynonia Sours, MD;  Location: [facility];  Service: Orthopedics;  Laterality: Left;     OB History   No obstetric history on file.     Family History  Problem Relation Age of Onset  . Heart disease Mother        No details  Social History   Tobacco Use  . Smoking status: Former Smoker    Quit date: 03/24/1978    Years since quitting: 41.5  . Smokeless tobacco: Never Used  Substance Use Topics  . Alcohol use: No  . Drug use: No    Home Medications Prior to Admission medications   Medication Sig Start Date End Date Taking? Authorizing Provider  acetaminophen (TYLENOL) 500 MG tablet Take 500 mg by mouth every 4 (four) hours as needed for mild pain.   Yes [provider]  albuterol (PROVENTIL HFA;VENTOLIN HFA) 108 (90 BASE) MCG/ACT inhaler Inhale 1 puff into the lungs every 6 (six) hours as needed for wheezing or shortness of  breath. Reported on 02/04/2016   Yes [provider]  aluminum-magnesium hydroxide-simethicone (MAALOX) 818-299-37 MG/5ML SUSP Take 30 mLs by mouth 3 (three) times daily as needed (acid).    Yes [provider]  aspirin EC 325 MG tablet Take 325 mg by mouth every morning.    Yes [provider]  calcium carbonate (OS-CAL) 600 MG TABS Take 600 mg by mouth 2 (two) times daily with a meal.   Yes [provider]  diclofenac sodium (VOLTAREN) 1 % GEL Apply 1 application topically every other day. 01/08/13  Yes [provider]  donepezil (ARICEPT) 5 MG tablet Take 5 mg by mouth at bedtime. 03/14/16  Yes [provider]  ferrous sulfate 325 (65 FE) MG tablet Take 1 tablet (325 mg total) by mouth 2 (two) times daily with a meal. Patient taking differently: Take 325 mg by mouth daily with breakfast.  05/27/16  Yes Fanny Skates, MD  guaifenesin (ROBITUSSIN) 100 MG/5ML syrup Take 200 mg by mouth every 6 (six) hours as needed for cough.   Yes [provider]  hydrochlorothiazide (MICROZIDE) 12.5 MG capsule Take 12.5 mg by mouth daily. 04/17/16  Yes [provider]  loperamide (IMODIUM) 2 MG capsule Take 2 mg by mouth as needed for diarrhea or loose stools.   Yes [provider]  magnesium hydroxide (MILK OF MAGNESIA) 400 MG/5ML suspension Take 30 mLs by mouth at bedtime as needed for mild constipation.   Yes [provider]  olmesartan (BENICAR) 40 MG tablet Take 40 mg by mouth daily.   Yes [provider]  potassium chloride SA (KLOR-CON) 20 MEQ tablet Take 20 mEq by mouth daily.   Yes [provider]  simvastatin (ZOCOR) 20 MG tablet Take 20 mg by mouth daily.   Yes [provider]  atorvastatin (LIPITOR) 40 MG tablet TAKE 1 TABLET (40 MG TOTAL) BY MOUTH DAILY. 04/14/17 07/13/17  Minus Breeding, MD    Allergies    Lyrica [pregabalin], Feldene [piroxicam], Lisinopril, Orudis [ketoprofen], and  Vibramycin [doxycycline calcium]  Review of Systems   Review of Systems  Gastrointestinal: Positive for diarrhea and vomiting.  All other systems reviewed and are negative.   Physical Exam Updated Vital Signs BP (!) 172/54 (BP Location: Left Arm)   Pulse (!) 59   Temp 97.7 F (36.5 C) (Oral)   Resp 16   SpO2 97%   Physical Exam Vitals and nursing note reviewed.  HENT:     Head: Normocephalic.     Nose: Nose normal.     Mouth/Throat:     Comments: MM slightly dry  Eyes:     Extraocular Movements: Extraocular movements intact.     Pupils: Pupils are equal, round, and reactive to light.  Cardiovascular:     Rate and Rhythm: Normal  rate and regular rhythm.     Pulses: Normal pulses.  Pulmonary:     Effort: Pulmonary effort is normal.     Breath sounds: Normal breath sounds.  Abdominal:     General: Abdomen is flat.     Palpations: Abdomen is soft.  Genitourinary:    Comments: Rectal- dark stool  Musculoskeletal:        General: Normal range of motion.     Cervical back: Normal range of motion.  Skin:    General: Skin is warm.     Capillary Refill: Capillary refill takes less than 2 seconds.  Neurological:     General: No focal deficit present.     Mental Status: She is alert.     Comments: Demented, moving all extremities   Psychiatric:        Mood and Affect: Mood normal.        Behavior: Behavior normal.     ED Results / Procedures / Treatments   Labs (all labs ordered are listed, but only abnormal results are displayed) Labs Reviewed  CBC WITH DIFFERENTIAL/PLATELET - Abnormal; Notable for the following components:      Result Value   RBC 3.52 (*)    Hemoglobin 10.0 (*)    HCT 33.4 (*)    MCHC 29.9 (*)    All other components within normal limits  COMPREHENSIVE METABOLIC PANEL - Abnormal; Notable for the following components:   Potassium 5.3 (*)    Glucose, Bld 100 (*)    BUN 34 (*)    Creatinine, Ser 1.28 (*)    Total Bilirubin 0.2 (*)    GFR calc  non Af Amer 38 (*)    GFR calc Af Amer 44 (*)    All other components within normal limits  GI PATHOGEN PANEL BY PCR, STOOL  C DIFFICILE QUICK SCREEN W PCR REFLEX  POC OCCULT BLOOD, ED  TYPE AND SCREEN  ABO/RH    EKG None  Radiology No results found.  Procedures Procedures (including critical care time)  Medications Ordered in ED Medications  sodium chloride (PF) 0.9 % injection (has no administration in time range)  pantoprazole (PROTONIX) injection 40 mg (40 mg Intravenous Given 09/28/19 1446)  sodium chloride 0.9 % bolus 500 mL (500 mLs Intravenous New Bag/Given (Non-Interop) 09/28/19 1445)  iohexol (OMNIPAQUE) 300 MG/ML solution 100 mL (75 mLs Intravenous Contrast Given 09/28/19 1556)    ED Course  I have reviewed the triage vital signs and the nursing notes.  Pertinent labs & imaging results that were available during my care of the patient were reviewed by me and considered in my medical decision making (see chart for details).    MDM Rules/Calculators/A&P                      KATHLEENA FREEMAN is a 86 y.o. female here with vomiting, diarrhea. Patient is not on blood thinners. Will get labs, CT ab/pel. Patient is guiac negative.    4:38 PM CT showed possible infectious colitis versus inflammatory colitis.  Patient's hemoglobin is stable and has no leukocytosis.  Patient had a large bowel movement that is watery and filled up her diaper. Will send off GI pathogen and C diff. If C diff positive or patient has numerous episodes of diarrhea, will need admission. Otherwise, can be dc back to facility. Signed out to Dr. Francia Greaves in the ED.   Final Clinical Impression(s) / ED Diagnoses  Final diagnoses:  None  Rx / DC Orders ED Discharge Orders    None       Drenda Freeze, MD 09/28/19 [phone]

## 2019-09-28 NOTE — ED Triage Notes (Signed)
Pt BIBA from [facility]. Pt has had 2 days of N/V/D. Pt had dark stool per staff. Staff on site concerned for GI bleed. Pt not on blood thinners. Soft abd. No hx of GI bleeds. No complaints of nausea with EMS.  EMS VS 158/80 60 118 [facility]

## 2019-09-28 NOTE — ED Notes (Signed)
Report given to supervisor of [facility].

## 2019-09-28 NOTE — ED Notes (Addendum)
Pt found out of the bed again. Pt directed back into the bed, and bed alarm placed under pt. Sitter requested, none at this time.

## 2019-09-28 NOTE — ED Notes (Signed)
Pt found out of the bed again. Pt also removed second IV that was placed apprx 10 mins ago. Will hold off on placement of IV at this time.

## 2019-09-29 LAB — ABO/RH: ABO/RH(D): A POS

## 2019-09-29 NOTE — ED Notes (Signed)
Informed by Dr. Francia Greaves to hold off on IV antibiotics until Covid swab results come back or pt has any loose stool episodes.

## 2019-10-06 ENCOUNTER — Other Ambulatory Visit: Payer: Self-pay

## 2019-10-06 ENCOUNTER — Emergency Department (HOSPITAL_COMMUNITY)
Admission: EM | Admit: 2019-10-06 | Discharge: 2019-10-06 | Disposition: A | Discharge status: Home / Self Care | Payer: Medicare Other | Attending: Emergency Medicine | Admitting: Emergency Medicine

## 2019-10-06 ENCOUNTER — Encounter (HOSPITAL_COMMUNITY): Payer: Self-pay

## 2019-10-06 ENCOUNTER — Emergency Department (HOSPITAL_COMMUNITY): Payer: Medicare Other

## 2019-10-06 DIAGNOSIS — Z87891 Personal history of nicotine dependence: Secondary | ICD-10-CM | POA: Insufficient documentation

## 2019-10-06 DIAGNOSIS — E119 Type 2 diabetes mellitus without complications: Secondary | ICD-10-CM | POA: Insufficient documentation

## 2019-10-06 DIAGNOSIS — Z85118 Personal history of other malignant neoplasm of bronchus and lung: Secondary | ICD-10-CM | POA: Diagnosis not present

## 2019-10-06 DIAGNOSIS — J45909 Unspecified asthma, uncomplicated: Secondary | ICD-10-CM | POA: Diagnosis not present

## 2019-10-06 DIAGNOSIS — I890 Lymphedema, not elsewhere classified: Secondary | ICD-10-CM | POA: Diagnosis not present

## 2019-10-06 DIAGNOSIS — I2510 Atherosclerotic heart disease of native coronary artery without angina pectoris: Secondary | ICD-10-CM | POA: Insufficient documentation

## 2019-10-06 DIAGNOSIS — I10 Essential (primary) hypertension: Secondary | ICD-10-CM | POA: Insufficient documentation

## 2019-10-06 DIAGNOSIS — R2231 Localized swelling, mass and lump, right upper limb: Secondary | ICD-10-CM | POA: Diagnosis present

## 2019-10-06 DIAGNOSIS — Z79899 Other long term (current) drug therapy: Secondary | ICD-10-CM | POA: Insufficient documentation

## 2019-10-06 DIAGNOSIS — Z853 Personal history of malignant neoplasm of breast: Secondary | ICD-10-CM | POA: Diagnosis not present

## 2019-10-06 NOTE — ED Triage Notes (Signed)
Patient in by EMS from [facility] with Right arm swelling that began about an hour ago per staff, denies any pain or injury

## 2019-10-06 NOTE — ED Notes (Signed)
Patient daughter called stated that patient has lymphedema in Right Arm, Arm stays swollen per daughter

## 2019-10-06 NOTE — ED Notes (Signed)
Pt can eat per EDP Wentz. Pt given ham sandwich and soda.

## 2019-10-06 NOTE — Discharge Instructions (Addendum)
There are no acute problems associated with the swelling of the right arm.  The patient has chronic lymphedema.  Follow-up with her primary care doctor as needed for problems.

## 2019-10-06 NOTE — ED Provider Notes (Signed)
[facility] DEPT Provider Note   CSN: [id number] Arrival date & time: 10/06/19  2038     History Chief Complaint  Patient presents with  . Arm Swelling    Right    Jennifer Garrett is a 86 y.o. female.  HPI She is here for evaluation of atraumatic swelling in her right arm.  She lives in a nursing care facility.  She has a history of right breast cancer (2017).  She was evaluated in the ED 8 days ago, for diarrhea.  Patient is unsure why she was here.  She denies headache, chest pain, shortness of breath.  Level 5 caveat-poor historian    Past Medical History:  Diagnosis Date  . Arthritis   . Asthma    rarely uses neb  . Breast cancer ([place]) 05/25/2016   right breast  . Breast cancer of lower-inner quadrant of right female breast ([place]) 01/22/2016  . Carpal tunnel syndrome   . Coronary artery disease    DES to OM3 90% stenosis 2003, 90% small RCA stenosis.    . Diabetes mellitus    not taken glipizide in over a year, checks surgars daily  . History of stomach ulcers   . Hyperlipemia   . Hypertension   . Irregular heart beat     Patient Active Problem List   Diagnosis Date Noted  . 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 Preop cardiovascular exam 03/18/2017  . Essential hypertension 03/18/2017  . Dyslipidemia 03/18/2017  . Coronary artery disease involving native coronary artery of native heart without angina pectoris 03/18/2017  . Bruit 03/18/2017  . Lung metastases ([place]) 06/23/2016  . Malignant neoplasm of lower-inner quadrant of right breast of female, estrogen receptor negative ([place]) 01/22/2016    Past Surgical History:  Procedure Laterality Date  . ABDOMINAL HYSTERECTOMY    . CARDIAC CATHETERIZATION  2003   stent-  . CARPAL TUNNEL RELEASE  03/29/2012   Procedure: CARPAL TUNNEL RELEASE;  Surgeon: Wynonia Sours, MD;  Location: [place];  Service: Orthopedics;  Laterality: Left;  . EYE SURGERY       catracts  . LESION EXCISION WITH COMPLEX REPAIR Right 05/25/2016   Procedure: complex repair of 25cm wound;  Surgeon: Irene Limbo, MD;  Location: [place];  Service: Plastics;  Laterality: Right;  . MASTECTOMY     right  . MASTECTOMY MODIFIED RADICAL Right 05/25/2016   Procedure: RIGHT MASTECTOMY MODIFIED RADICAL;  Surgeon: Fanny Skates, MD;  Location: [place];  Service: General;  Laterality: Right;  . PUNCH BIOPSY OF SKIN Left 04/04/2017   Procedure: PUNCH BIOPSY OF LEFT BREAST SKIN;  Surgeon: Fanny Skates, MD;  Location: [place];  Service: General;  Laterality: Left;  . RADIOACTIVE SEED GUIDED AXILLARY SENTINEL LYMPH NODE Left 04/04/2017   Procedure: RADIOACTIVE SEED TARGETED LEFT AXILLARY LYMPH NODE EXCISION;  Surgeon: Fanny Skates, MD;  Location: [place];  Service: General;  Laterality: Left;  . SHOULDER ARTHROSCOPY     right and left  . TRIGGER FINGER RELEASE  03/29/2012   Procedure: RELEASE TRIGGER FINGER/A-1 PULLEY;  Surgeon: Wynonia Sours, MD;  Location: [place];  Service: Orthopedics;  Laterality: Left;     OB History   No obstetric history on file.     Family History  Problem Relation Age of Onset  . Heart disease Mother        No details     Social History  Tobacco Use  . Smoking status: Former Smoker    Quit date: 03/24/1978    Years since quitting: 41.5  . Smokeless tobacco: Never Used  Substance Use Topics  . Alcohol use: No  . Drug use: No    Home Medications Prior to Admission medications   Medication Sig Start Date End Date Taking? Authorizing Provider  acetaminophen (TYLENOL) 500 MG tablet Take 500 mg by mouth every 4 (four) hours as needed for mild pain.    [provider]  albuterol (PROVENTIL HFA;VENTOLIN HFA) 108 (90 BASE) MCG/ACT inhaler Inhale 1 puff into the lungs every 6 (six) hours as needed for wheezing or shortness of breath. Reported on 02/04/2016    [provider]   aluminum-magnesium hydroxide-simethicone (MAALOX) 017-510-25 MG/5ML SUSP Take 30 mLs by mouth 3 (three) times daily as needed (acid).     [provider]  aspirin EC 325 MG tablet Take 325 mg by mouth every morning.     [provider]  atorvastatin (LIPITOR) 40 MG tablet TAKE 1 TABLET (40 MG TOTAL) BY MOUTH DAILY. 04/14/17 07/13/17  Minus Breeding, MD  calcium carbonate (OS-CAL) 600 MG TABS Take 600 mg by mouth 2 (two) times daily with a meal.    [provider]  diclofenac sodium (VOLTAREN) 1 % GEL Apply 1 application topically every other day. 01/08/13   [provider]  donepezil (ARICEPT) 5 MG tablet Take 5 mg by mouth at bedtime. 03/14/16   [provider]  ferrous sulfate 325 (65 FE) MG tablet Take 1 tablet (325 mg total) by mouth 2 (two) times daily with a meal. Patient taking differently: Take 325 mg by mouth daily with breakfast.  05/27/16   Fanny Skates, MD  guaifenesin (ROBITUSSIN) 100 MG/5ML syrup Take 200 mg by mouth every 6 (six) hours as needed for cough.    [provider]  hydrochlorothiazide (MICROZIDE) 12.5 MG capsule Take 12.5 mg by mouth daily. 04/17/16   [provider]  loperamide (IMODIUM) 2 MG capsule Take 2 mg by mouth as needed for diarrhea or loose stools.    [provider]  magnesium hydroxide (MILK OF MAGNESIA) 400 MG/5ML suspension Take 30 mLs by mouth at bedtime as needed for mild constipation.    [provider]  metroNIDAZOLE (FLAGYL) 500 MG tablet Take 1 tablet (500 mg total) by mouth 2 (two) times daily. 09/28/19   Valarie Merino, MD  olmesartan (BENICAR) 40 MG tablet Take 40 mg by mouth daily.    [provider]  ondansetron (ZOFRAN) 4 MG tablet Take 1 tablet (4 mg total) by mouth every 6 (six) hours. 09/28/19   Valarie Merino, MD  potassium chloride SA (KLOR-CON) 20 MEQ tablet Take 20 mEq by mouth daily.    [provider]  simvastatin (ZOCOR) 20 MG tablet Take  20 mg by mouth daily.    [provider]    Allergies    Lyrica [pregabalin], Feldene [piroxicam], Lisinopril, Orudis [ketoprofen], and Vibramycin [doxycycline calcium]  Review of Systems   Review of Systems  Unable to perform ROS: Other    Physical Exam Updated Vital Signs BP 127/64 (BP Location: Left Arm)   Pulse 60   Temp 97.9 F (36.6 C) (Oral)   Resp 12   SpO2 96%   Physical Exam Vitals and nursing note reviewed.  Constitutional:      General: She is not in acute distress.    Appearance: She is well-developed. She is not ill-appearing, toxic-appearing  or diaphoretic.  HENT:     Head: Normocephalic and atraumatic.     Right Ear: External ear normal.     Left Ear: External ear normal.  Eyes:     Conjunctiva/sclera: Conjunctivae normal.     Pupils: Pupils are equal, round, and reactive to light.  Neck:     Trachea: Phonation normal.  Cardiovascular:     Rate and Rhythm: Normal rate and regular rhythm.     Heart sounds: Normal heart sounds.  Pulmonary:     Effort: Pulmonary effort is normal. No respiratory distress.     Breath sounds: Normal breath sounds. No stridor. No wheezing.  Abdominal:     General: There is no distension.     Palpations: Abdomen is soft.     Tenderness: There is no abdominal tenderness.  Musculoskeletal:        General: Normal range of motion.     Cervical back: Normal range of motion and neck supple.     Comments: Right arm swelling, from below shoulder to fingertips.  Moderate amount of edema is present.  No associated erythema, fluctuance or coolness of the extremity.  Normal passive range of motion of the right arm.  Skin:    General: Skin is warm and dry.     Comments: No abnormal appearance of the skin.  Neurological:     Mental Status: She is alert.     Cranial Nerves: No cranial nerve deficit.     Sensory: No sensory deficit.     Motor: No abnormal muscle tone.     Coordination: Coordination normal.  Psychiatric:         Mood and Affect: Mood normal.        Behavior: Behavior normal.     ED Results / Procedures / Treatments   Labs (all labs ordered are listed, but only abnormal results are displayed) Labs Reviewed - No data to display  EKG None  Radiology DG Chest 2 View  Result Date: 10/06/2019 CLINICAL DATA:  Right arm swelling. EXAM: CHEST - 2 VIEW COMPARISON:  Dec 13, 2016 FINDINGS: Mild, stable increased lung markings are seen without evidence of acute infiltrate, pleural effusion or pneumothorax. The heart size and mediastinal contours are within normal limits. Radiopaque surgical clips are seen overlying the bilateral axilla, right greater than left. There is marked severity calcification of the aortic arch with tortuosity of the descending thoracic aorta. A chronic sixth left rib fracture is seen. The visualized skeletal structures are otherwise unremarkable. IMPRESSION: No active cardiopulmonary disease. Electronically Signed   By: Virgina Norfolk M.D.   On: 10/06/2019 21:28    Procedures Procedures (including critical care time)  Medications Ordered in ED Medications - No data to display  ED Course  I have reviewed the triage vital signs and the nursing notes.  Pertinent labs & imaging results that were available during my care of the patient were reviewed by me and considered in my medical decision making (see chart for details).  Clinical Course as of Oct 06 2151  Sat Oct 06, 2019  2152 No CHF or infiltrate, interpreted by me  DG Chest 2 View [EW]    Clinical Course User Index [EW] Daleen Bo, MD   MDM Rules/Calculators/A&P                       Patient Vitals for the past 24 hrs:  BP Temp Temp src Pulse Resp SpO2  10/06/19 2056 127/64 97.9 F (  36.6 C) Oral 60 12 96 %    9:21 PM Reevaluation with update and discussion. After initial assessment and treatment, an updated evaluation reveals the patient is comfortable.  I discussed the findings with the patient's  daughter, [name] on the telephone.  [name] states that the patient has chronic lymphedema of the right arm since her mastectomy and lymph node dissection.  There are no other ongoing problems according to [facility].  She was thankful, and will talk to the facility regarding transfer tonight.  She will follow up with Dr [name] regarding the prior episode of diarrhea, and finding of colitis on the CT imaging.  All questions were answered. Daleen Bo   Medical Decision Making: Chronic swelling right arm.  No evident acute cellulitis, or DVT.  No respiratory distress.  Screening chest x-ray is reassuring.  CRITICAL CARE-no Performed by: Daleen Bo   Nursing Notes Reviewed/ Care Coordinated Applicable Imaging Reviewed Interpretation of Laboratory Data incorporated into ED treatment  The patient appears reasonably screened and/or stabilized for discharge and I doubt any other medical condition or other [facility] requiring further screening, evaluation, or treatment in the ED at this time prior to discharge.  Plan: Home Medications-continue usual; Home Treatments-usual activity; return here if the recommended treatment, does not improve the symptoms; Recommended follow up-PCP as needed    Final Clinical Impression(s) / ED Diagnoses Final diagnoses:  Lymphedema of right arm    Rx / DC Orders ED Discharge Orders    None       Daleen Bo, MD 10/06/19 2153

## 2020-04-03 ENCOUNTER — Emergency Department (HOSPITAL_COMMUNITY): Payer: Medicare Other

## 2020-04-03 ENCOUNTER — Encounter (HOSPITAL_COMMUNITY): Payer: Self-pay

## 2020-04-03 ENCOUNTER — Emergency Department (HOSPITAL_COMMUNITY)
Admission: EM | Admit: 2020-04-03 | Discharge: 2020-04-03 | Disposition: A | Discharge status: Skilled Nursing Facility | Payer: Medicare Other | Attending: Emergency Medicine | Admitting: Emergency Medicine

## 2020-04-03 ENCOUNTER — Other Ambulatory Visit: Payer: Self-pay

## 2020-04-03 DIAGNOSIS — Z7982 Long term (current) use of aspirin: Secondary | ICD-10-CM | POA: Diagnosis not present

## 2020-04-03 DIAGNOSIS — Z79899 Other long term (current) drug therapy: Secondary | ICD-10-CM | POA: Diagnosis not present

## 2020-04-03 DIAGNOSIS — Z951 Presence of aortocoronary bypass graft: Secondary | ICD-10-CM | POA: Insufficient documentation

## 2020-04-03 DIAGNOSIS — Z853 Personal history of malignant neoplasm of breast: Secondary | ICD-10-CM | POA: Diagnosis not present

## 2020-04-03 DIAGNOSIS — J45909 Unspecified asthma, uncomplicated: Secondary | ICD-10-CM | POA: Insufficient documentation

## 2020-04-03 DIAGNOSIS — E119 Type 2 diabetes mellitus without complications: Secondary | ICD-10-CM | POA: Diagnosis not present

## 2020-04-03 DIAGNOSIS — M25551 Pain in right hip: Secondary | ICD-10-CM | POA: Diagnosis present

## 2020-04-03 DIAGNOSIS — W19XXXA Unspecified fall, initial encounter: Secondary | ICD-10-CM | POA: Diagnosis not present

## 2020-04-03 DIAGNOSIS — I2510 Atherosclerotic heart disease of native coronary artery without angina pectoris: Secondary | ICD-10-CM | POA: Diagnosis not present

## 2020-04-03 DIAGNOSIS — Z87891 Personal history of nicotine dependence: Secondary | ICD-10-CM | POA: Insufficient documentation

## 2020-04-03 DIAGNOSIS — M79601 Pain in right arm: Secondary | ICD-10-CM | POA: Diagnosis not present

## 2020-04-03 DIAGNOSIS — F0390 Unspecified dementia without behavioral disturbance: Secondary | ICD-10-CM | POA: Insufficient documentation

## 2020-04-03 DIAGNOSIS — I10 Essential (primary) hypertension: Secondary | ICD-10-CM | POA: Diagnosis not present

## 2020-04-03 NOTE — ED Triage Notes (Addendum)
Patient arrived after an unwitnessed fall. Head hit on door and has complaints of right hip and arm pain. Patient has a brace on her right arm and unable to specify where the pain is. No known LOC

## 2020-04-03 NOTE — ED Provider Notes (Signed)
[facility] DEPT Provider Note   CSN: [id number] Arrival date & time: 04/03/20  9470     History Chief Complaint  Patient presents with  . Fall    Jennifer Garrett is a 86 y.o. female.  Patient brought sent in from [facility].  Unwitnessed fall.  Patient complaining of right hip pain.  Patient has a history of dementia.  Seems to be baseline.  Patient also has her right arm wrapped.  I think that is due to lymphedema based on old notes.  But patient also complaining of right arm pain as well.  Patient denies hitting her head.        Past Medical History:  Diagnosis Date  . Arthritis   . Asthma    rarely uses neb  . Breast cancer ([place]) 05/25/2016   right breast  . Breast cancer of lower-inner quadrant of right female breast ([place]) 01/22/2016  . Carpal tunnel syndrome   . Coronary artery disease    DES to OM3 90% stenosis 2003, 90% small RCA stenosis.    . Diabetes mellitus    not taken glipizide in over a year, checks surgars daily  . History of stomach ulcers   . Hyperlipemia   . Hypertension   . Irregular heart beat     Patient Active Problem List   Diagnosis Date Noted  . 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 Preop cardiovascular exam 03/18/2017  . Essential hypertension 03/18/2017  . Dyslipidemia 03/18/2017  . Coronary artery disease involving native coronary artery of native heart without angina pectoris 03/18/2017  . Bruit 03/18/2017  . Lung metastases ([place]) 06/23/2016  . Malignant neoplasm of lower-inner quadrant of right breast of female, estrogen receptor negative ([place]) 01/22/2016    Past Surgical History:  Procedure Laterality Date  . ABDOMINAL HYSTERECTOMY    . CARDIAC CATHETERIZATION  2003   stent-  . CARPAL TUNNEL RELEASE  03/29/2012   Procedure: CARPAL TUNNEL RELEASE;  Surgeon: Wynonia Sours, MD;  Location: [place];  Service: Orthopedics;  Laterality: Left;  . EYE SURGERY      catracts  . LESION EXCISION WITH COMPLEX REPAIR Right 05/25/2016   Procedure: complex repair of 25cm wound;  Surgeon: Irene Limbo, MD;  Location: [place];  Service: Plastics;  Laterality: Right;  . MASTECTOMY     right  . MASTECTOMY MODIFIED RADICAL Right 05/25/2016   Procedure: RIGHT MASTECTOMY MODIFIED RADICAL;  Surgeon: Fanny Skates, MD;  Location: [place];  Service: General;  Laterality: Right;  . PUNCH BIOPSY OF SKIN Left 04/04/2017   Procedure: PUNCH BIOPSY OF LEFT BREAST SKIN;  Surgeon: Fanny Skates, MD;  Location: [place];  Service: General;  Laterality: Left;  . RADIOACTIVE SEED GUIDED AXILLARY SENTINEL LYMPH NODE Left 04/04/2017   Procedure: RADIOACTIVE SEED TARGETED LEFT AXILLARY LYMPH NODE EXCISION;  Surgeon: Fanny Skates, MD;  Location: [place];  Service: General;  Laterality: Left;  . SHOULDER ARTHROSCOPY     right and left  . TRIGGER FINGER RELEASE  03/29/2012   Procedure: RELEASE TRIGGER FINGER/A-1 PULLEY;  Surgeon: Wynonia Sours, MD;  Location: [place];  Service: Orthopedics;  Laterality: Left;     OB History   No obstetric history on file.     Family History  Problem Relation Age of Onset  . Heart disease Mother        No details     Social History  Tobacco Use  . Smoking status: Former Smoker    Quit date: 03/24/1978    Years since quitting: 42.0  . Smokeless tobacco: Never Used  Substance Use Topics  . Alcohol use: No  . Drug use: No    Home Medications Prior to Admission medications   Medication Sig Start Date End Date Taking? Authorizing Provider  acetaminophen (TYLENOL) 500 MG tablet Take 500 mg by mouth every 4 (four) hours as needed for mild pain.    [provider]  albuterol (PROVENTIL HFA;VENTOLIN HFA) 108 (90 BASE) MCG/ACT inhaler Inhale 1 puff into the lungs every 6 (six) hours as needed for wheezing or shortness of breath. Reported on 02/04/2016    [provider]    aluminum-magnesium hydroxide-simethicone (MAALOX) 160-109-32 MG/5ML SUSP Take 30 mLs by mouth 3 (three) times daily as needed (acid).     [provider]  aspirin EC 325 MG tablet Take 325 mg by mouth every morning.     [provider]  atorvastatin (LIPITOR) 40 MG tablet TAKE 1 TABLET (40 MG TOTAL) BY MOUTH DAILY. 04/14/17 07/13/17  Minus Breeding, MD  calcium carbonate (OS-CAL) 600 MG TABS Take 600 mg by mouth 2 (two) times daily with a meal.    [provider]  diclofenac sodium (VOLTAREN) 1 % GEL Apply 1 application topically every other day. 01/08/13   [provider]  donepezil (ARICEPT) 5 MG tablet Take 5 mg by mouth at bedtime. 03/14/16   [provider]  ferrous sulfate 325 (65 FE) MG tablet Take 1 tablet (325 mg total) by mouth 2 (two) times daily with a meal. Patient taking differently: Take 325 mg by mouth daily with breakfast.  05/27/16   Fanny Skates, MD  guaifenesin (ROBITUSSIN) 100 MG/5ML syrup Take 200 mg by mouth every 6 (six) hours as needed for cough.    [provider]  hydrochlorothiazide (MICROZIDE) 12.5 MG capsule Take 12.5 mg by mouth daily. 04/17/16   [provider]  loperamide (IMODIUM) 2 MG capsule Take 2 mg by mouth as needed for diarrhea or loose stools.    [provider]  magnesium hydroxide (MILK OF MAGNESIA) 400 MG/5ML suspension Take 30 mLs by mouth at bedtime as needed for mild constipation.    [provider]  metroNIDAZOLE (FLAGYL) 500 MG tablet Take 1 tablet (500 mg total) by mouth 2 (two) times daily. 09/28/19   Valarie Merino, MD  olmesartan (BENICAR) 40 MG tablet Take 40 mg by mouth daily.    [provider]  ondansetron (ZOFRAN) 4 MG tablet Take 1 tablet (4 mg total) by mouth every 6 (six) hours. 09/28/19   Valarie Merino, MD  potassium chloride SA (KLOR-CON) 20 MEQ tablet Take 20 mEq by mouth daily.    [provider]  simvastatin (ZOCOR) 20 MG tablet Take  20 mg by mouth daily.    [provider]    Allergies    Lyrica [pregabalin], Feldene [piroxicam], Lisinopril, Orudis [ketoprofen], and Vibramycin [doxycycline calcium]  Review of Systems   Review of Systems  Unable to perform ROS: Dementia    Physical Exam Updated Vital Signs BP (!) 153/63   Pulse (!) 51   Temp 98.7 F (37.1 C) (Oral)   Resp 16   SpO2 100%   Physical Exam Vitals and nursing note reviewed.  Constitutional:      General: She is not in acute distress.    Appearance: Normal appearance. She is well-developed.  HENT:  Head: Normocephalic and atraumatic.  Eyes:     Extraocular Movements: Extraocular movements intact.     Conjunctiva/sclera: Conjunctivae normal.     Pupils: Pupils are equal, round, and reactive to light.  Cardiovascular:     Rate and Rhythm: Normal rate and regular rhythm.     Heart sounds: No murmur heard.   Pulmonary:     Effort: Pulmonary effort is normal. No respiratory distress.     Breath sounds: Normal breath sounds.  Abdominal:     Palpations: Abdomen is soft.     Tenderness: There is no abdominal tenderness.  Musculoskeletal:        General: Tenderness present. Normal range of motion.     Cervical back: Neck supple.     Comments: Patient with tenderness to palpation to the right hip..  But excellent range of motion of both lower extremities at knee and hip.  Also good range of motion of right upper extremity at elbow shoulder wrist and fingers.  With patient with complaint of pain subjectively.  Good cap refill  radial pulse is 1+.  Skin:    General: Skin is warm and dry.     Capillary Refill: Capillary refill takes less than 2 seconds.  Neurological:     Mental Status: She is alert. Mental status is at baseline.     ED Results / Procedures / Treatments   Labs (all labs ordered are listed, but only abnormal results are displayed) Labs Reviewed - No data to display  EKG None  Radiology DG Forearm  Right  Result Date: 04/03/2020 CLINICAL DATA:  Fall at home EXAM: RIGHT FOREARM - 2 VIEW COMPARISON:  [place] evaluation of the same date. FINDINGS: There is no evidence of fracture or other focal bone lesions. Soft tissues are unremarkable. Degenerative changes about the wrist. IMPRESSION: Degenerative changes about the wrist, no acute fracture or dislocation of the forearm. Electronically Signed   By: Zetta Bills M.D.   On: 04/03/2020 08:26   CT Head Wo Contrast  Result Date: 04/03/2020 CLINICAL DATA:  Unwitnessed fall with head injury EXAM: CT HEAD WITHOUT CONTRAST CT CERVICAL SPINE WITHOUT CONTRAST TECHNIQUE: Multidetector CT imaging of the head and cervical spine was performed following the standard protocol without intravenous contrast. Multiplanar CT image reconstructions of the cervical spine were also generated. COMPARISON:  Cervical spine CT 06/17/2014 FINDINGS: CT HEAD FINDINGS Brain: No evidence of acute infarction, hemorrhage, hydrocephalus, extra-axial collection or mass lesion/mass effect. Generalized atrophy in keeping with age. Moderate chronic low-density in the cerebral white matter. Vascular: No hyperdense vessel or unexpected calcification. Skull: Normal. Negative for fracture or focal lesion. Sinuses/Orbits: No acute finding. CT CERVICAL SPINE FINDINGS Alignment: No traumatic malalignment Skull base and vertebrae: No acute fracture Soft tissues and spinal canal: No prevertebral fluid or swelling. No visible canal hematoma. Disc levels: Ordinary degenerative changes without visible cord impingement Upper chest: No traumatic finding IMPRESSION: No evidence of intracranial or cervical spine injury. Electronically Signed   By: Monte Fantasia M.D.   On: 04/03/2020 06:54   CT Cervical Spine Wo Contrast  Result Date: 04/03/2020 CLINICAL DATA:  Unwitnessed fall with head injury EXAM: CT HEAD WITHOUT CONTRAST CT CERVICAL SPINE WITHOUT CONTRAST TECHNIQUE: Multidetector CT imaging of the  head and cervical spine was performed following the standard protocol without intravenous contrast. Multiplanar CT image reconstructions of the cervical spine were also generated. COMPARISON:  Cervical spine CT 06/17/2014 FINDINGS: CT HEAD FINDINGS Brain: No evidence of acute infarction, hemorrhage, hydrocephalus, extra-axial  collection or mass lesion/mass effect. Generalized atrophy in keeping with age. Moderate chronic low-density in the cerebral white matter. Vascular: No hyperdense vessel or unexpected calcification. Skull: Normal. Negative for fracture or focal lesion. Sinuses/Orbits: No acute finding. CT CERVICAL SPINE FINDINGS Alignment: No traumatic malalignment Skull base and vertebrae: No acute fracture Soft tissues and spinal canal: No prevertebral fluid or swelling. No visible canal hematoma. Disc levels: Ordinary degenerative changes without visible cord impingement Upper chest: No traumatic finding IMPRESSION: No evidence of intracranial or cervical spine injury. Electronically Signed   By: Monte Fantasia M.D.   On: 04/03/2020 06:54   DG Humerus Right  Result Date: 04/03/2020 CLINICAL DATA:  Fall, soreness on RIGHT arm.  Surgery years ago. EXAM: RIGHT HUMERUS - 2+ VIEW COMPARISON:  None FINDINGS: There is no evidence of fracture or other focal bone lesions. Soft tissues are unremarkable. Incidental note is made of glenohumeral degenerative changes and calcific tendinopathy about the RIGHT shoulder. IMPRESSION: Negative evaluation of the RIGHT humerus. Electronically Signed   By: Zetta Bills M.D.   On: 04/03/2020 08:28   DG Hip Unilat  With Pelvis 2-3 Views Right  Result Date: 04/03/2020 CLINICAL DATA:  [place]. Right hip pain. EXAM: DG HIP (WITH OR WITHOUT PELVIS) 2-3V RIGHT COMPARISON:  CT scan 09/28/2019 FINDINGS: Moderate to advanced bilateral hip joint degenerative changes but no acute fracture or definite plain film evidence of AVN. The pubic symphysis and SI joints are intact. No pelvic  fractures or bone lesions. Stable calcifications noted along the region of the tensor fascia lata. IMPRESSION: Moderate to advanced bilateral hip joint degenerative changes but no acute fracture. Electronically Signed   By: Marijo Sanes M.D.   On: 04/03/2020 06:13    Procedures Procedures (including critical care time)  Medications Ordered in ED Medications - No data to display  ED Course  I have reviewed the triage vital signs and the nursing notes.  Pertinent labs & imaging results that were available during my care of the patient were reviewed by me and considered in my medical decision making (see chart for details).    MDM Rules/Calculators/A&P                          Work-up for the fall CT head and neck which was preordered before saw patient without any acute findings.  X-ray of humerus and forearm and right hip and pelvis without any bony abnormalities.  Clinically patient without any significant injury.  Patient stable for discharge back to nursing facility.     Final Clinical Impression(s) / ED Diagnoses Final diagnoses:  Fall, initial encounter    Rx / DC Orders ED Discharge Orders    None       Fredia Sorrow, MD 04/03/20 [phone]

## 2020-04-03 NOTE — Discharge Instructions (Addendum)
Work-up for the fall without any acute findings.  Patient had CT head neck x-rays of pelvis and right hip and right arm and forearm.  Patient stable for discharge back to [facility].

## 2020-06-03 ENCOUNTER — Telehealth: Payer: Self-pay

## 2020-06-03 ENCOUNTER — Telehealth: Payer: Self-pay | Admitting: Oncology

## 2020-06-03 NOTE — Telephone Encounter (Signed)
Scheduled appt per 11/16 sch msg - pt daughter aware of appts made

## 2020-06-03 NOTE — Telephone Encounter (Signed)
Pt's daughter, [name] called with concerns about when to f/u with Dr Jana Hakim. Pt was supposed to see Dr Jana Hakim in October 2020. Message sent to scheduling to get pt in for follow up. Daughter knows to expect a call.

## 2020-07-04 ENCOUNTER — Other Ambulatory Visit: Payer: Self-pay

## 2020-07-04 DIAGNOSIS — Z171 Estrogen receptor negative status [ER-]: Secondary | ICD-10-CM

## 2020-07-06 NOTE — Progress Notes (Signed)
Jennifer Garrett  [phone] [phone]     ID: Jennifer Garrett DOB: [id number]  MR#: [id number]  ZWC#:[id number]  Patient Care Team: Nolene Ebbs, MD as PCP - General (Internal Medicine) Fanny Skates, MD as Consulting Physician (General Surgery) Jarrel Knoke, Virgie Dad, MD as Consulting Physician (Oncology) Minus Breeding, MD as Consulting Physician (Cardiology) Irene Limbo, MD as Consulting Physician (Plastic Surgery) OTHER MD:   CHIEF COMPLAINT: Locally advanced triple negative breast cancer (s/p right mastectomy)  CURRENT TREATMENT: Observation   INTERVAL HISTORY:  Jennifer Garrett returns today for follow-up of her triple negative breast cancer. She is under observation and was last seen here in 12/2018.  She is accompanied by her daughter Jennifer Garrett  Since her last visit, she has not undergone any breast imaging.  She presented to the ED on 09/28/2019 with abdominal distention, nausea, and vomiting. 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  She experienced a fall on 04/03/2020 and presented to the ED. Work up showed no acute findings.   REVIEW OF SYSTEMS: Jaymee has been in a memory care unit  ([facility]) since November 2020.  Her daughter Jennifer Garrett is not satisfied with the care that she is receiving there and indeed the "compression sleeve" in her right arm clearly is the wrong compression sleeve as it is extremely loose. The patient otherwise appears comfortable. Jennifer Garrett tells me her mother complained of some problem in her left armpit recently. A detailed review of systems through the daughter was otherwise noncontributory   COVID 19 VACCINATION  STATUS: Status post [place] x2 with booster due Dec 2021   BREAST CANCER HISTORY: From the original intake note:  The patient presented to the emergency room 11/20/2015 with a complaint of a mass in the lower aspect of her right breast, which was painful and bleeding. The patient stated the mass had been present approximately a month. An ultrasound of the breast was obtained which found a 4.4 cm mass in the right breast at the 6:00 position. This was read as most consistent with an abscess. The patient was started on clindamycin and referred to the [facility], where on 01/05/2016 she underwent bilateral diagnostic mammography with tomography and right  ultrasonography.   Mammography showed far posteriorly in the 5:00 region of the right breast a 4.1 cm mass with enlarged axillary adenopathy. On exam there was a fungating ulcerated bleeding mass in the right breast at the 5:00 position 7 cm from the nipple. The right axilla was "full" on palpation. Ultrasound showed a 5.4 cm mass at the 5:00 position of the right breast 7 cm from the nipple, with at least 3 enlarged abnormal axillary lymph nodes, the largest measuring 3.7 cm.  Biopsy of the right breast mass and one axillary lymph node on 01/06/2016 showed ([id number]) both to be positive for an invasive ductal carcinoma, grade 3, triple negative, with the HER-2 signals ratio between 1.21-1.40 and the number per cell 2.85-3.00. The MIB-1-2 was 70%.  The patient's subsequent history is as detailed below   PAST MEDICAL HISTORY: Past Medical History:  Diagnosis Date  . Arthritis   . Asthma    rarely uses neb  . Breast cancer ([facility]) 05/25/2016   right breast  . Breast cancer  of lower-inner quadrant of right female breast ([facility]) 01/22/2016  . Carpal tunnel syndrome   . Coronary artery disease    DES to OM3 90% stenosis 2003, 90% small RCA stenosis.    . Diabetes mellitus    not taken glipizide in over a year, checks surgars daily  . History of  stomach ulcers   . Hyperlipemia   . Hypertension   . Irregular heart beat     PAST SURGICAL HISTORY: Past Surgical History:  Procedure Laterality Date  . ABDOMINAL HYSTERECTOMY    . CARDIAC CATHETERIZATION  2003   stent-  . CARPAL TUNNEL RELEASE  03/29/2012   Procedure: CARPAL TUNNEL RELEASE;  Surgeon: Wynonia Sours, MD;  Location: [facility];  Service: Orthopedics;  Laterality: Left;  . EYE SURGERY     catracts  . LESION EXCISION WITH COMPLEX REPAIR Right 05/25/2016   Procedure: complex repair of 25cm wound;  Surgeon: Irene Limbo, MD;  Location: [facility];  Service: Plastics;  Laterality: Right;  . MASTECTOMY     right  . MASTECTOMY MODIFIED RADICAL Right 05/25/2016   Procedure: RIGHT MASTECTOMY MODIFIED RADICAL;  Surgeon: Fanny Skates, MD;  Location: [facility];  Service: General;  Laterality: Right;  . PUNCH BIOPSY OF SKIN Left 04/04/2017   Procedure: PUNCH BIOPSY OF LEFT BREAST SKIN;  Surgeon: Fanny Skates, MD;  Location: [facility];  Service: General;  Laterality: Left;  . RADIOACTIVE SEED GUIDED AXILLARY SENTINEL LYMPH NODE Left 04/04/2017   Procedure: RADIOACTIVE SEED TARGETED LEFT AXILLARY LYMPH NODE EXCISION;  Surgeon: Fanny Skates, MD;  Location: [facility];  Service: General;  Laterality: Left;  . SHOULDER ARTHROSCOPY     right and left  . TRIGGER FINGER RELEASE  03/29/2012   Procedure: RELEASE TRIGGER FINGER/A-1 PULLEY;  Surgeon: Wynonia Sours, MD;  Location: [facility];  Service: Orthopedics;  Laterality: Left;    FAMILY HISTORY Family History  Problem Relation Age of Onset  . Heart disease Mother        No details   The patient's father died from "old age" at age 42. The patient's mother died at age 54 from complications of high blood pressure and diabetes. The patient had one brother, 4 sisters. There is no cancer in the family to her knowledge    GYNECOLOGIC HISTORY:  No LMP recorded. Patient has had a  hysterectomy.  menarche age 78, first live birth age 83. The patient is GX P2. She underwent abdominal hysterectomy with bilateral salpingo-oophorectomy in her 20s. She did not undergo hormone replacement   SOCIAL HISTORY:  Ariane used to work for [employer]. She lives with her daughter Enis Leatherwood, who is Medical sales representative for [employer]. The patient's son Deriyah Kunath lives in [place]. The patient has 1 grandchild, no great-grandchildren. She attends a local [employer] DIRECTIVES:  not in place. At the 01/22/2016 visit the patient was given the appropriate documents to complete. She tells me she intends to name her daughter Jennifer Garrett as her healthcare part of attorney    HEALTH MAINTENANCE: Social History   Tobacco Use  . Smoking status: Former Smoker    Quit date: 03/24/1978    Years since quitting: 42.3  . Smokeless tobacco: Never Used  Substance Use Topics  . Alcohol use: No  . Drug use: No     Colonoscopy: Never   PAP: status post hysterectomy   Bone density: 09/06/2017 with a  T-score of -0.2   Allergies  Allergen Reactions  . Lyrica [Pregabalin] Swelling    SWELLING REACTION UNSPECIFIED   . Feldene [Piroxicam] Rash  . Lisinopril Other (See Comments)    States that it makes her "sensitive to light" unknown  . Orudis [Ketoprofen] Rash  . Vibramycin [Doxycycline Calcium] Rash and Hives    Current Outpatient Medications  Medication Sig Dispense Refill  . acetaminophen (TYLENOL) 500 MG tablet Take 500 mg by mouth every 4 (four) hours as needed for mild pain.    Marland Kitchen albuterol (PROVENTIL HFA;VENTOLIN HFA) 108 (90 BASE) MCG/ACT inhaler Inhale 1 puff into the lungs every 6 (six) hours as needed for wheezing or shortness of breath. Reported on 02/04/2016    . aluminum-magnesium hydroxide-simethicone (MAALOX) 568-127-51 MG/5ML SUSP Take 30 mLs by mouth 3 (three) times daily as needed (acid).     Marland Kitchen aspirin EC 325 MG tablet Take 325 mg by mouth  every morning.     Marland Kitchen atorvastatin (LIPITOR) 40 MG tablet TAKE 1 TABLET (40 MG TOTAL) BY MOUTH DAILY. 90 tablet 3  . calcium carbonate (OS-CAL) 600 MG TABS Take 600 mg by mouth 2 (two) times daily with a meal.    . diclofenac sodium (VOLTAREN) 1 % GEL Apply 1 application topically every other day.    . donepezil (ARICEPT) 5 MG tablet Take 5 mg by mouth at bedtime.    . ferrous sulfate 325 (65 FE) MG tablet Take 1 tablet (325 mg total) by mouth 2 (two) times daily with a meal. (Patient taking differently: Take 325 mg by mouth daily with breakfast. ) 60 tablet 3  . guaifenesin (ROBITUSSIN) 100 MG/5ML syrup Take 200 mg by mouth every 6 (six) hours as needed for cough.    . hydrochlorothiazide (MICROZIDE) 12.5 MG capsule Take 12.5 mg by mouth daily.    Marland Kitchen loperamide (IMODIUM) 2 MG capsule Take 2 mg by mouth as needed for diarrhea or loose stools.    . magnesium hydroxide (MILK OF MAGNESIA) 400 MG/5ML suspension Take 30 mLs by mouth at bedtime as needed for mild constipation.    . metroNIDAZOLE (FLAGYL) 500 MG tablet Take 1 tablet (500 mg total) by mouth 2 (two) times daily. 14 tablet 0  . olmesartan (BENICAR) 40 MG tablet Take 40 mg by mouth daily.    . ondansetron (ZOFRAN) 4 MG tablet Take 1 tablet (4 mg total) by mouth every 6 (six) hours. 12 tablet 0  . potassium chloride SA (KLOR-CON) 20 MEQ tablet Take 20 mEq by mouth daily.    . simvastatin (ZOCOR) 20 MG tablet Take 20 mg by mouth daily.     No current facility-administered medications for this visit.    OBJECTIVE:  African-American woman in no acute distress Vitals:   07/07/20 0854  BP: (!) 158/67  Pulse: (!) 59  Resp: 17  Temp: 97.6 F (36.4 C)  SpO2: 99%     Body mass index is 27.2 kg/m.   ECOG FS:2  Sclerae unicteric, EOMs intact Wearing a mask No cervical or supraclavicular adenopathy Lungs no rales or rhonchi Heart regular rate and rhythm Abd soft, nontender, positive bowel sounds MSK no focal spinal tenderness, grade 1  right upper extremity lymphedema Neuro: nonfocal, guarded affect Breasts: The right breast is status post mastectomy. There is no evidence of chest wall recurrence. I do not palpate a mass in the left breast or left axilla. There are no skin changes or nipple changes of concern.   LAB RESULTS:  CMP     Component Value Date/Time   NA 141 09/28/2019 1442   NA 143 05/04/2017 1418   K 5.3 (H) 09/28/2019 1442   K 4.2 05/04/2017 1418   CL 108 09/28/2019 1442   CO2 23 09/28/2019 1442   CO2 28 05/04/2017 1418   GLUCOSE 100 (H) 09/28/2019 1442   GLUCOSE 125 05/04/2017 1418   BUN 34 (H) 09/28/2019 1442   BUN 22.7 05/04/2017 1418   CREATININE 1.28 (H) 09/28/2019 1442   CREATININE 1.37 (H) 06/23/2018 1051   CREATININE 1.1 05/04/2017 1418   CALCIUM 9.0 09/28/2019 1442   CALCIUM 9.3 05/04/2017 1418   PROT 7.1 09/28/2019 1442   PROT 7.2 05/04/2017 1418   ALBUMIN 3.6 09/28/2019 1442   ALBUMIN 3.3 (L) 05/04/2017 1418   AST 22 09/28/2019 1442   AST 16 06/23/2018 1051   AST 20 05/04/2017 1418   ALT 11 09/28/2019 1442   ALT 7 06/23/2018 1051   ALT 11 05/04/2017 1418   ALKPHOS 51 09/28/2019 1442   ALKPHOS 67 05/04/2017 1418   BILITOT 0.2 (L) 09/28/2019 1442   BILITOT 0.3 06/23/2018 1051   BILITOT 0.32 05/04/2017 1418   GFRNONAA 38 (L) 09/28/2019 1442   GFRNONAA 35 (L) 06/23/2018 1051   GFRAA 44 (L) 09/28/2019 1442   GFRAA 41 (L) 06/23/2018 1051    INo results found for: SPEP, UPEP  Lab Results  Component Value Date   WBC 6.1 09/28/2019   NEUTROABS 4.3 09/28/2019   HGB 10.0 (L) 09/28/2019   HCT 33.4 (L) 09/28/2019   MCV 94.9 09/28/2019   PLT 210 09/28/2019      Chemistry      Component Value Date/Time   NA 141 09/28/2019 1442   NA 143 05/04/2017 1418   K 5.3 (H) 09/28/2019 1442   K 4.2 05/04/2017 1418   CL 108 09/28/2019 1442   CO2 23 09/28/2019 1442   CO2 28 05/04/2017 1418   BUN 34 (H) 09/28/2019 1442   BUN 22.7 05/04/2017 1418   CREATININE 1.28 (H) 09/28/2019 1442    CREATININE 1.37 (H) 06/23/2018 1051   CREATININE 1.1 05/04/2017 1418      Component Value Date/Time   CALCIUM 9.0 09/28/2019 1442   CALCIUM 9.3 05/04/2017 1418   ALKPHOS 51 09/28/2019 1442   ALKPHOS 67 05/04/2017 1418   AST 22 09/28/2019 1442   AST 16 06/23/2018 1051   AST 20 05/04/2017 1418   ALT 11 09/28/2019 1442   ALT 7 06/23/2018 1051   ALT 11 05/04/2017 1418   BILITOT 0.2 (L) 09/28/2019 1442   BILITOT 0.3 06/23/2018 1051   BILITOT 0.32 05/04/2017 1418       No results found for: LABCA2  No components found for: LABCA125  No results for input(s): INR in the last 168 hours.  Urinalysis    Component Value Date/Time   COLORURINE YELLOW 05/19/2016 1138   APPEARANCEUR CLEAR 05/19/2016 1138   LABSPEC 1.017 05/19/2016 1138   PHURINE 5.5 05/19/2016 1138   GLUCOSEU NEGATIVE 05/19/2016 1138   HGBUR NEGATIVE 05/19/2016 1138   BILIRUBINUR NEGATIVE 05/19/2016 1138   KETONESUR NEGATIVE 05/19/2016 1138   PROTEINUR NEGATIVE 05/19/2016 1138   UROBILINOGEN 1.0 11/28/2013 0143   NITRITE NEGATIVE 05/19/2016 1138   LEUKOCYTESUR NEGATIVE 05/19/2016 1138    STUDIES: No results found.   ASSESSMENT: 86 y.o. [place] woman status post right breast lower outer quadrant and right axillary lymph node biopsy 01/06/2016 both positive for a clinical T4 N1 M1, stage  IV invasive ductal carcinoma, grade 3, triple negative, with an MIB-1 of 70%  (a) CT scan of the chest and bone scan 01/27/2016 showed scattered pulmonary nodules in addition to locally advanced disease  (b) repeat CT scan of the chest 04/12/2016 shows persistent pulmonary nodules, progression in local disease  (c) CA-27-29 is noninformative  (1) neoadjuvant chemotherapy consisting of capecitabine, 1500 mg twice daily, 7 days on 7 days off, starting 01/27/2016, discontinued 04/14/2016 with progression  (2) right modified radical mastectomy 05/25/2016 showed a pT3 pN2, stage IIIA invasive ductal carcinoma, grade 3, with  negative margins.  (3) adjuvant radiation 08/24/16- 10/07/16 Site/dose:   1) Right chest wall / 50.4 Gy in 28 fractions                         2) Right chest wall boost / 10 Gy in 5 fractions  (4) restaging studies and follow-up:  (a) PET scan 08/24/2016 was negative except for a 1.0 cm LEFT axillary lymph node  (b)  biopsy of the left axillary lymph node 02/22/2017 showed breast carcinoma, again triple negative  (c) CT chest on 10/27/2017 showed: No evidence of breast cancer recurrence.   (d) CT chest on 05/04/2018 showed: NED  (5) on 04/04/2017 she underwent removal of the left axillary mass (measuring 4.8 cm,) together with 2 benign lymph nodes.  (a) skin punch biopsy on the same day was benign  (6) adjuvant radiation to the left axillary tumor bed completed 07/01/2017:  Site/dose:   Left axilla / 50 Gy in 25 fy   PLAN: Ginny is now 4 years out from definitive surgery for her breast cancer with no evidence of disease recurrence. This is favorable.  There has certainly been a marked change and Maryama appears profoundly demented at this point, essentially not answering questions, staring forward through the exam, but otherwise comfortable. I discussed advanced directives with her daughter and she thinks that she has made her mother a DNR at the facility but she is not sure.  I wrote them a new prescription for compression sleeves since the facility apparently has misplaced the compression sleeves that Jennifer Garrett got for her mother.  We discussed the fact that so long as Fareeda is comfortable we are doing everything we need to do and if she is not comfortable then we need to do more. Jennifer Garrett was agreeable to this plan. They will see me again in September. They know to call for any other issue that may develop before then.  Total encounter time 25 min.Sarajane Jews C. Farra Nikolic, MD 07/07/20 9:17 AM Medical Oncology and Hematology [facility] [place], [place]  [address] Tel. [phone]    Fax. [phone]   I, Wilburn Mylar, am acting as scribe for Dr. Virgie Dad. Keasia Dubose.  I, Lurline Del MD, have reviewed the above documentation for accuracy and completeness, and I agree with the above.   *Total Encounter Time as defined by the Centers for Medicare and Medicaid Services includes, in addition to the face-to-face time of a patient visit (documented in the note above) non-face-to-face time: obtaining and reviewing outside history, ordering and reviewing medications, tests or procedures, care coordination (communications with other health care professionals or caregivers) and documentation in the medical record.

## 2020-07-07 ENCOUNTER — Inpatient Hospital Stay: Payer: Medicare Other | Attending: Oncology | Admitting: Oncology

## 2020-07-07 ENCOUNTER — Inpatient Hospital Stay: Payer: Medicare Other

## 2020-07-07 ENCOUNTER — Other Ambulatory Visit: Payer: Self-pay

## 2020-07-07 VITALS — BP 158/67 | HR 59 | Temp 97.6°F | Resp 17 | Ht 60.0 in | Wt 139.2 lb

## 2020-07-07 DIAGNOSIS — Z79899 Other long term (current) drug therapy: Secondary | ICD-10-CM | POA: Insufficient documentation

## 2020-07-07 DIAGNOSIS — Z923 Personal history of irradiation: Secondary | ICD-10-CM | POA: Diagnosis not present

## 2020-07-07 DIAGNOSIS — C50311 Malignant neoplasm of lower-inner quadrant of right female breast: Secondary | ICD-10-CM | POA: Diagnosis not present

## 2020-07-07 DIAGNOSIS — I890 Lymphedema, not elsewhere classified: Secondary | ICD-10-CM | POA: Diagnosis not present

## 2020-07-07 DIAGNOSIS — C7800 Secondary malignant neoplasm of unspecified lung: Secondary | ICD-10-CM

## 2020-07-07 DIAGNOSIS — Z853 Personal history of malignant neoplasm of breast: Secondary | ICD-10-CM | POA: Insufficient documentation

## 2020-07-07 DIAGNOSIS — Z9011 Acquired absence of right breast and nipple: Secondary | ICD-10-CM | POA: Diagnosis not present

## 2020-07-07 DIAGNOSIS — C773 Secondary and unspecified malignant neoplasm of axilla and upper limb lymph nodes: Secondary | ICD-10-CM

## 2020-07-07 DIAGNOSIS — Z171 Estrogen receptor negative status [ER-]: Secondary | ICD-10-CM | POA: Diagnosis not present

## 2020-07-07 DIAGNOSIS — Z87891 Personal history of nicotine dependence: Secondary | ICD-10-CM | POA: Diagnosis not present

## 2020-07-07 DIAGNOSIS — Z7982 Long term (current) use of aspirin: Secondary | ICD-10-CM | POA: Diagnosis not present

## 2020-07-08 ENCOUNTER — Telehealth: Payer: Self-pay | Admitting: Oncology

## 2020-07-08 NOTE — Telephone Encounter (Signed)
Scheduled appts per 12/20 los. Pt's daughter confirmed appt date and time.

## 2020-10-04 ENCOUNTER — Emergency Department (HOSPITAL_COMMUNITY): Payer: Medicare Other

## 2020-10-04 ENCOUNTER — Emergency Department (HOSPITAL_COMMUNITY)
Admission: EM | Admit: 2020-10-04 | Discharge: 2020-10-04 | Disposition: A | Discharge status: Home / Self Care | Payer: Medicare Other | Attending: Emergency Medicine | Admitting: Emergency Medicine

## 2020-10-04 ENCOUNTER — Encounter (HOSPITAL_COMMUNITY): Payer: Self-pay

## 2020-10-04 ENCOUNTER — Other Ambulatory Visit: Payer: Self-pay

## 2020-10-04 DIAGNOSIS — M25562 Pain in left knee: Secondary | ICD-10-CM | POA: Insufficient documentation

## 2020-10-04 DIAGNOSIS — Z87891 Personal history of nicotine dependence: Secondary | ICD-10-CM | POA: Diagnosis not present

## 2020-10-04 DIAGNOSIS — Z7982 Long term (current) use of aspirin: Secondary | ICD-10-CM | POA: Diagnosis not present

## 2020-10-04 DIAGNOSIS — Z951 Presence of aortocoronary bypass graft: Secondary | ICD-10-CM | POA: Diagnosis not present

## 2020-10-04 DIAGNOSIS — S0990XA Unspecified injury of head, initial encounter: Secondary | ICD-10-CM | POA: Diagnosis present

## 2020-10-04 DIAGNOSIS — I10 Essential (primary) hypertension: Secondary | ICD-10-CM | POA: Insufficient documentation

## 2020-10-04 DIAGNOSIS — J45909 Unspecified asthma, uncomplicated: Secondary | ICD-10-CM | POA: Diagnosis not present

## 2020-10-04 DIAGNOSIS — I2510 Atherosclerotic heart disease of native coronary artery without angina pectoris: Secondary | ICD-10-CM | POA: Insufficient documentation

## 2020-10-04 DIAGNOSIS — Z79899 Other long term (current) drug therapy: Secondary | ICD-10-CM | POA: Diagnosis not present

## 2020-10-04 DIAGNOSIS — W19XXXA Unspecified fall, initial encounter: Secondary | ICD-10-CM | POA: Insufficient documentation

## 2020-10-04 DIAGNOSIS — Z853 Personal history of malignant neoplasm of breast: Secondary | ICD-10-CM | POA: Diagnosis not present

## 2020-10-04 DIAGNOSIS — S0083XA Contusion of other part of head, initial encounter: Secondary | ICD-10-CM | POA: Insufficient documentation

## 2020-10-04 DIAGNOSIS — R58 Hemorrhage, not elsewhere classified: Secondary | ICD-10-CM | POA: Diagnosis not present

## 2020-10-04 DIAGNOSIS — E119 Type 2 diabetes mellitus without complications: Secondary | ICD-10-CM | POA: Diagnosis not present

## 2020-10-04 DIAGNOSIS — F0390 Unspecified dementia without behavioral disturbance: Secondary | ICD-10-CM | POA: Diagnosis not present

## 2020-10-04 HISTORY — DX: Vascular dementia, unspecified severity, without behavioral disturbance, psychotic disturbance, mood disturbance, and anxiety: F01.50

## 2020-10-04 LAB — CBC WITH DIFFERENTIAL/PLATELET
Abs Immature Granulocytes: 0.01 10*3/uL (ref 0.00–0.07)
Basophils Absolute: 0.1 10*3/uL (ref 0.0–0.1)
Basophils Relative: 1 %
Eosinophils Absolute: 0.2 10*3/uL (ref 0.0–0.5)
Eosinophils Relative: 3 %
HCT: 37 % (ref 36.0–46.0)
Hemoglobin: 11.3 g/dL — ABNORMAL LOW (ref 12.0–15.0)
Immature Granulocytes: 0 %
Lymphocytes Relative: 38 %
Lymphs Abs: 2.1 10*3/uL (ref 0.7–4.0)
MCH: 28.8 pg (ref 26.0–34.0)
MCHC: 30.5 g/dL (ref 30.0–36.0)
MCV: 94.1 fL (ref 80.0–100.0)
Monocytes Absolute: 0.6 10*3/uL (ref 0.1–1.0)
Monocytes Relative: 12 %
Neutro Abs: 2.5 10*3/uL (ref 1.7–7.7)
Neutrophils Relative %: 46 %
Platelets: 209 10*3/uL (ref 150–400)
RBC: 3.93 MIL/uL (ref 3.87–5.11)
RDW: 12.4 % (ref 11.5–15.5)
WBC: 5.4 10*3/uL (ref 4.0–10.5)
nRBC: 0 % (ref 0.0–0.2)

## 2020-10-04 LAB — COMPREHENSIVE METABOLIC PANEL
ALT: 13 U/L (ref 0–44)
AST: 25 U/L (ref 15–41)
Albumin: 4.5 g/dL (ref 3.5–5.0)
Alkaline Phosphatase: 64 U/L (ref 38–126)
Anion gap: 9 (ref 5–15)
BUN: 31 mg/dL — ABNORMAL HIGH (ref 8–23)
CO2: 22 mmol/L (ref 22–32)
Calcium: 9.6 mg/dL (ref 8.9–10.3)
Chloride: 108 mmol/L (ref 98–111)
Creatinine, Ser: 1.24 mg/dL — ABNORMAL HIGH (ref 0.44–1.00)
GFR, Estimated: 42 mL/min — ABNORMAL LOW (ref >60)
Glucose, Bld: 88 mg/dL (ref 70–99)
Potassium: 4.9 mmol/L (ref 3.5–5.1)
Sodium: 139 mmol/L (ref 135–145)
Total Bilirubin: 0.7 mg/dL (ref 0.3–1.2)
Total Protein: 8.3 g/dL — ABNORMAL HIGH (ref 6.5–8.1)

## 2020-10-04 LAB — CK: Total CK: 163 U/L (ref 38–234)

## 2020-10-04 MED ORDER — ACETAMINOPHEN 500 MG PO TABS
1000.0000 mg | ORAL_TABLET | Freq: Once | ORAL | Status: AC
  Administered 2020-10-04: 1000 mg via ORAL
  Filled 2020-10-04: qty 2

## 2020-10-04 NOTE — ED Triage Notes (Signed)
Per EMS- Patient is from [facility]. Patient had an unwitnessed fall and has a hematoma abaove the left eyebrow. Patient denies LOC, but does have dementia.  C-collar placed by EMS.

## 2020-10-04 NOTE — ED Notes (Signed)
Patient ambulated in room as well as to the bathroom Patient steady stand along side assist

## 2020-10-04 NOTE — ED Provider Notes (Signed)
[place] DEPT Provider Note   CSN: [id number] Arrival date & time: 10/04/20  0932     History Chief Complaint  Patient presents with  . Fall   LEVEL Jennifer Garrett is a 87 y.o. female with PMHx CAD, Diabetes, HTN, HLD, Dementia who presents to the ED today via EMS for unwitnessed fall at [facility].  Per EMS patient denies loss of consciousness however she does have dementia.  She is currently planing of pain to her left forehead where hematoma is.  No other complaints per EMS however patient continues to state that her left knee will intermittently hurt and she does have ecchymosis to this area.  Unable to obtain additional information as [name] is not currently answering their phone.  History limited secondary to dementia.  The history is provided by the patient and medical records.       Past Medical History:  Diagnosis Date  . Arthritis   . Asthma    rarely uses neb  . Breast cancer (Jennifer Garrett) 05/25/2016   right breast  . Breast cancer of lower-inner quadrant of right female breast ([place]) 01/22/2016  . Carpal tunnel syndrome   . Coronary artery disease    DES to OM3 90% stenosis 2003, 90% small RCA stenosis.    . Diabetes mellitus    not taken glipizide in over a year, checks surgars daily  . History of stomach ulcers   . Hyperlipemia   . Hypertension   . Irregular heart beat   . Vascular dementia [facility])     Patient Active Problem List   Diagnosis Date Noted  . 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 Preop cardiovascular exam 03/18/2017  . Essential hypertension 03/18/2017  . Dyslipidemia 03/18/2017  . Coronary artery disease involving native coronary artery of native heart without angina pectoris 03/18/2017  . Bruit 03/18/2017  . Lung metastases ([place]) 06/23/2016  . Malignant neoplasm of lower-inner quadrant of right breast of female, estrogen receptor negative ([place]) 01/22/2016     Past Surgical History:  Procedure Laterality Date  . ABDOMINAL HYSTERECTOMY    . CARDIAC CATHETERIZATION  2003   stent-  . CARPAL TUNNEL RELEASE  03/29/2012   Procedure: CARPAL TUNNEL RELEASE;  Surgeon: Wynonia Sours, MD;  Location: [place];  Service: Orthopedics;  Laterality: Left;  . EYE SURGERY     catracts  . LESION EXCISION WITH COMPLEX REPAIR Right 05/25/2016   Procedure: complex repair of 25cm wound;  Surgeon: Irene Limbo, MD;  Location: [place];  Service: Plastics;  Laterality: Right;  . MASTECTOMY     right  . MASTECTOMY MODIFIED RADICAL Right 05/25/2016   Procedure: RIGHT MASTECTOMY MODIFIED RADICAL;  Surgeon: Fanny Skates, MD;  Location: [place];  Service: General;  Laterality: Right;  . PUNCH BIOPSY OF SKIN Left 04/04/2017   Procedure: PUNCH BIOPSY OF LEFT BREAST SKIN;  Surgeon: Fanny Skates, MD;  Location: [place];  Service: General;  Laterality: Left;  . RADIOACTIVE SEED GUIDED AXILLARY SENTINEL LYMPH NODE Left 04/04/2017   Procedure: RADIOACTIVE SEED TARGETED LEFT AXILLARY LYMPH NODE EXCISION;  Surgeon: Fanny Skates, MD;  Location: [place];  Service: General;  Laterality: Left;  . SHOULDER ARTHROSCOPY     right and left  . TRIGGER FINGER RELEASE  03/29/2012   Procedure: RELEASE TRIGGER FINGER/A-1 PULLEY;  Surgeon: Wynonia Sours, MD;  Location: [place];  Service: Orthopedics;  Laterality: Left;     OB History   No obstetric history on file.     Family History  Problem Relation Age of Onset  . Heart disease Mother        No details     Social History   Tobacco Use  . Smoking status: Former Smoker    Quit date: 03/24/1978    Years since quitting: 42.5  . Smokeless tobacco: Never Used  Vaping Use  . Vaping Use: Never used  Substance Use Topics  . Alcohol use: No  . Drug use: No    Home Medications Prior to Admission medications   Medication Sig Start Date End Date Taking?  Authorizing Provider  acetaminophen (TYLENOL) 500 MG tablet Take 500 mg by mouth every 4 (four) hours as needed for mild pain.   Yes [provider]  albuterol (PROVENTIL HFA;VENTOLIN HFA) 108 (90 BASE) MCG/ACT inhaler Inhale 1 puff into the lungs every 6 (six) hours as needed for wheezing or shortness of breath. Reported on 02/04/2016   Yes [provider]  aluminum-magnesium hydroxide-simethicone (MAALOX) 967-893-81 MG/5ML SUSP Take 30 mLs by mouth 3 (three) times daily as needed (acid).    Yes [provider]  aspirin EC 325 MG tablet Take 325 mg by mouth every morning.    Yes [provider]  calcium carbonate (OS-CAL) 600 MG TABS Take 600 mg by mouth 2 (two) times daily with a meal.   Yes [provider]  diclofenac sodium (VOLTAREN) 1 % GEL Apply 1 application topically every other day. 01/08/13  Yes [provider]  donepezil (ARICEPT) 5 MG tablet Take 5 mg by mouth at bedtime. 03/14/16  Yes [provider]  ferrous sulfate 325 (65 FE) MG tablet Take 1 tablet (325 mg total) by mouth 2 (two) times daily with a meal. Patient taking differently: Take 325 mg by mouth daily with breakfast. 05/27/16  Yes Fanny Skates, MD  guaifenesin (ROBITUSSIN) 100 MG/5ML syrup Take 200 mg by mouth every 6 (six) hours as needed for cough.   Yes [provider]  hydrochlorothiazide (MICROZIDE) 12.5 MG capsule Take 12.5 mg by mouth daily. 04/17/16  Yes [provider]  loperamide (IMODIUM) 2 MG capsule Take 2 mg by mouth as needed for diarrhea or loose stools.   Yes [provider]  magnesium hydroxide (MILK OF MAGNESIA) 400 MG/5ML suspension Take 30 mLs by mouth at bedtime as needed for mild constipation.   Yes [provider]  metroNIDAZOLE (FLAGYL) 500 MG tablet Take 1 tablet (500 mg total) by mouth 2 (two) times daily. 09/28/19  Yes Valarie Merino, MD  olmesartan (BENICAR) 40 MG tablet Take 40 mg by mouth daily.    Yes [provider]  ondansetron (ZOFRAN) 4 MG tablet Take 1 tablet (4 mg total) by mouth every 6 (six) hours. 09/28/19  Yes Messick, Wallis Bamberg, MD  potassium chloride SA (KLOR-CON) 20 MEQ tablet Take 20 mEq by mouth daily.   Yes [provider]  simvastatin (ZOCOR) 20 MG tablet Take 20 mg by mouth daily.   Yes [provider]  atorvastatin (LIPITOR) 40 MG tablet TAKE 1 TABLET (40 MG TOTAL) BY MOUTH DAILY. 04/14/17 07/13/17  Minus Breeding, MD    Allergies    Lyrica [pregabalin], Feldene [piroxicam], Lisinopril, Orudis [ketoprofen], and Vibramycin [doxycycline calcium]  Review of Systems   Review of Systems  Unable to perform ROS: Dementia  Musculoskeletal: Positive for arthralgias.    Physical Exam Updated Vital  Signs BP (!) 177/68 (BP Location: Left Arm)   Pulse (!) 54   Temp 97.6 F (36.4 C) (Oral)   Resp 15   Ht 5\' 5"  (1.651 m)   Wt 74.8 kg   SpO2 100%   BMI 27.46 kg/m   Physical Exam Vitals and nursing note reviewed.  Constitutional:      Appearance: She is not ill-appearing.  HENT:     Head: Normocephalic.     Comments: Hematoma noted to left forehead with surrounding TTP Eyes:     Extraocular Movements: Extraocular movements intact.     Conjunctiva/sclera: Conjunctivae normal.     Pupils: Pupils are equal, round, and reactive to light.  Neck:     Comments: C collar in place Cardiovascular:     Rate and Rhythm: Normal rate and regular rhythm.     Pulses: Normal pulses.  Pulmonary:     Effort: Pulmonary effort is normal.     Breath sounds: Normal breath sounds. No wheezing, rhonchi or rales.  Abdominal:     Palpations: Abdomen is soft.     Tenderness: There is no abdominal tenderness. There is no guarding or rebound.  Musculoskeletal:     Cervical back: Neck supple.     Comments: Pelvis is stable   Ecchymosis and mild swelling noted to the left knee. ROM intact. Strength 5/5 and sensation intact throughout. Negative anterior and  posterior drawer test. No varus or valgus laxity. 2+ DP pulse.   Skin:    General: Skin is warm and dry.  Neurological:     Mental Status: She is alert. Mental status is at baseline.     Comments: Pleasantly demented     ED Results / Procedures / Treatments   Labs (all labs ordered are listed, but only abnormal results are displayed) Labs Reviewed  COMPREHENSIVE METABOLIC PANEL - Abnormal; Notable for the following components:      Result Value   BUN 31 (*)    Creatinine, Ser 1.24 (*)    Total Protein 8.3 (*)    GFR, Estimated 42 (*)    All other components within normal limits  CBC WITH DIFFERENTIAL/PLATELET - Abnormal; Notable for the following components:   Hemoglobin 11.3 (*)    All other components within normal limits  CK    EKG None  Radiology CT Head Wo Contrast  Result Date: 10/04/2020 CLINICAL DATA:  Fall, neck pain. Additional provided: Dementia, unwitnessed fall. Head trauma, minor. EXAM: CT HEAD WITHOUT CONTRAST CT CERVICAL SPINE WITHOUT CONTRAST TECHNIQUE: Multidetector CT imaging of the head and cervical spine was performed following the standard protocol without intravenous contrast. Multiplanar CT image reconstructions of the cervical spine were also generated. COMPARISON:  Head CT 04/03/2020.  Cervical spine CT 04/03/2020. FINDINGS: CT HEAD FINDINGS Brain: Mild generalized parenchymal atrophy. Mild ill-defined hypoattenuation within the cerebral white matter is nonspecific, but compatible with chronic small vessel ischemic disease. There is no acute intracranial hemorrhage. No demarcated cortical infarct. No extra-axial fluid collection. No evidence of intracranial mass. No midline shift. Vascular: No hyperdense vessel.  Atherosclerotic calcifications Skull: Normal. Negative for fracture or focal lesion. Sinuses/Orbits: Visualized orbits show no acute finding. No significant paranasal sinus disease at the imaged levels. Other: Left forehead soft tissue hematoma. CT  CERVICAL SPINE FINDINGS Mildly motion degraded exam. Alignment: Straightening of the expected cervical lordosis. No significant spondylolisthesis. Skull base and vertebrae: The basion-dental and atlanto-dental intervals are maintained.No evidence of acute fracture to the cervical spine. Soft tissues and spinal  canal: No prevertebral fluid or swelling. No visible canal hematoma. Disc levels: Cervical spondylosis. Disc space narrowing is greatest at C5-C6 (moderate). Multilevel shallow disc bulges and small central disc protrusions. Uncovertebral hypertrophy at C5-C6. Multilevel facet arthrosis. No more than mild appreciable spinal canal stenosis. Left-sided bony neural foraminal narrowing at C5-C6 Upper chest: No consolidation within the imaged lung apices. Mild linear atelectasis versus scarring within the left lung apex. No visible pneumothorax. Other: Bilateral thyroid nodules measuring up to 3.8 cm on the right and 3.1 cm on the left. IMPRESSION: CT head: 1. No evidence of acute intracranial abnormality. 2. Left forehead hematoma. 3. Mild generalized parenchymal atrophy and chronic small vessel ischemic disease CT cervical spine: 1. Mildly motion degraded examination. 2. No evidence of acute fracture to the cervical spine. 3. Cervical spondylosis as described. 4. Multiple thyroid nodules, the largest measuring 3.8 cm within the right lobe. Given the patient's advanced age, follow-up thyroid ultrasound should be pursued only as clinically appropriate. Electronically Signed   By: Kellie Simmering DO   On: 10/04/2020 10:58   CT Cervical Spine Wo Contrast  Result Date: 10/04/2020 CLINICAL DATA:  Fall, neck pain. Additional provided: Dementia, unwitnessed fall. Head trauma, minor. EXAM: CT HEAD WITHOUT CONTRAST CT CERVICAL SPINE WITHOUT CONTRAST TECHNIQUE: Multidetector CT imaging of the head and cervical spine was performed following the standard protocol without intravenous contrast. Multiplanar CT image  reconstructions of the cervical spine were also generated. COMPARISON:  Head CT 04/03/2020.  Cervical spine CT 04/03/2020. FINDINGS: CT HEAD FINDINGS Brain: Mild generalized parenchymal atrophy. Mild ill-defined hypoattenuation within the cerebral white matter is nonspecific, but compatible with chronic small vessel ischemic disease. There is no acute intracranial hemorrhage. No demarcated cortical infarct. No extra-axial fluid collection. No evidence of intracranial mass. No midline shift. Vascular: No hyperdense vessel.  Atherosclerotic calcifications Skull: Normal. Negative for fracture or focal lesion. Sinuses/Orbits: Visualized orbits show no acute finding. No significant paranasal sinus disease at the imaged levels. Other: Left forehead soft tissue hematoma. CT CERVICAL SPINE FINDINGS Mildly motion degraded exam. Alignment: Straightening of the expected cervical lordosis. No significant spondylolisthesis. Skull base and vertebrae: The basion-dental and atlanto-dental intervals are maintained.No evidence of acute fracture to the cervical spine. Soft tissues and spinal canal: No prevertebral fluid or swelling. No visible canal hematoma. Disc levels: Cervical spondylosis. Disc space narrowing is greatest at C5-C6 (moderate). Multilevel shallow disc bulges and small central disc protrusions. Uncovertebral hypertrophy at C5-C6. Multilevel facet arthrosis. No more than mild appreciable spinal canal stenosis. Left-sided bony neural foraminal narrowing at C5-C6 Upper chest: No consolidation within the imaged lung apices. Mild linear atelectasis versus scarring within the left lung apex. No visible pneumothorax. Other: Bilateral thyroid nodules measuring up to 3.8 cm on the right and 3.1 cm on the left. IMPRESSION: CT head: 1. No evidence of acute intracranial abnormality. 2. Left forehead hematoma. 3. Mild generalized parenchymal atrophy and chronic small vessel ischemic disease CT cervical spine: 1. Mildly motion  degraded examination. 2. No evidence of acute fracture to the cervical spine. 3. Cervical spondylosis as described. 4. Multiple thyroid nodules, the largest measuring 3.8 cm within the right lobe. Given the patient's advanced age, follow-up thyroid ultrasound should be pursued only as clinically appropriate. Electronically Signed   By: Kellie Simmering DO   On: 10/04/2020 10:58   DG Knee Complete 4 Views Left  Result Date: 10/04/2020 CLINICAL DATA:  Unwitnessed fall. EXAM: LEFT KNEE - COMPLETE 4+ VIEW COMPARISON:  None. FINDINGS: No evidence  of fracture, dislocation, or joint effusion. Soft tissues are unremarkable. IMPRESSION: No acute fracture or dislocation. Electronically Signed   By: Abelardo Diesel M.D.   On: 10/04/2020 11:18    Procedures Procedures   Medications Ordered in ED Medications  acetaminophen (TYLENOL) tablet 1,000 mg (1,000 mg Oral Given 10/04/20 1146)    ED Course  I have reviewed the triage vital signs and the nursing notes.  Pertinent labs & imaging results that were available during my care of the patient were reviewed by me and considered in my medical decision making (see chart for details).    MDM Rules/Calculators/A&P                          87 year old female with a history of dementia from [place] exit presents to the ED today with complaint of unwitnessed fall.  She does have a large hematoma to her left forehead, does not appear to be on blood thinners.  She denies loss of consciousness with EMS.  She is also complaining of some intermittent left knee pain and has ecchymosis to this area, range of motion intact.  Neurovascularly intact throughout.  Pelvis is stable.  She does have a c-collar in place.  We will plan for CT head and CT C-spine given age and fall.  I have called [facility] twice without any success.  Will attempt to call them again to gain additional information however given fall was unwitnessed we will plan for EKG, lab work as well as CK to  assess for concern for rhabdo.  If no acute findings will plan to discharge back to facility.  EKG without acute ischemic changes  CT Head and CT C spine negative. Xray of knee negative  Labwork unremarkable at this time.  CBC without leukocytosis and hgb stable at 11.3 CMP with stable creatinine 1.24 and BUN 31. No other electrolyte abnormalities.  CK negative at 163  Pt has been able to ambulate in the ED without difficulty. Will discharge back to facility at this time.   This note was prepared using Dragon voice recognition software and may include unintentional dictation errors due to the inherent limitations of voice recognition software.  Final Clinical Impression(s) / ED Diagnoses Final diagnoses:  Fall, initial encounter  Injury of head, initial encounter    Rx / DC Orders ED Discharge Orders    None       Discharge Instructions     Please take Tylenol as needed for pain and apply ice to the hematoma on the forehead to help with pain/swelling  Follow up with PCP regarding ED visit today       Eustaquio Maize, PA-C 10/04/20 1301    Arnaldo Natal, MD 10/04/20 1432

## 2020-10-04 NOTE — ED Notes (Signed)
[id number] Daughter Margeret

## 2020-10-04 NOTE — Discharge Instructions (Addendum)
Please take Tylenol as needed for pain and apply ice to the hematoma on the forehead to help with pain/swelling  Follow up with PCP regarding ED visit today

## 2021-03-29 NOTE — Progress Notes (Signed)
[facility]  [phone] [phone]     ID: QUINESHA SELINGER DOB: February 17, 1932  MR#: [id number]  CBS#:[id number]  Patient Care Team: Nolene Ebbs, MD as PCP - General (Internal Medicine) Fanny Skates, MD as Consulting Physician (General Surgery) Jerold Yoss, Virgie Dad, MD as Consulting Physician (Oncology) Minus Breeding, MD as Consulting Physician (Cardiology) Irene Limbo, MD as Consulting Physician (Plastic Surgery) OTHER MD:   CHIEF COMPLAINT: Locally advanced triple negative breast cancer (s/p right mastectomy)  CURRENT TREATMENT: Observation   INTERVAL HISTORY:  Yousra was scheduled today for follow-up of her triple negative breast cancer. However she did ot show  Since her last visit, she has not undergone any breast imaging.  She presented to the ED on 09/28/2019 with abdominal distention, nausea, and vomiting. 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  She experienced a fall on 04/03/2020 and presented to the ED. Work up showed no acute findings.   REVIEW OF SYSTEMS: [facility]   COVID 71 VACCINATION STATUS: Status post Nathalie x2 with booster due Dec 2021   BREAST CANCER HISTORY: From the original intake note:  The patient presented to the emergency room 11/20/2015 with a complaint of a mass in the lower aspect of her right breast, which was painful and bleeding. The patient stated the mass had been present approximately a month. An ultrasound of the breast was obtained which found a 4.4 cm mass in the right breast at the 6:00 position. This was read as most consistent with an abscess. The patient was started on clindamycin and referred to  the [facility], where on 01/05/2016 she underwent bilateral diagnostic mammography with tomography and right  ultrasonography.   Mammography showed far posteriorly in the 5:00 region of the right breast a 4.1 cm mass with enlarged axillary adenopathy. On exam there was a fungating ulcerated bleeding mass in the right breast at the 5:00 position 7 cm from the nipple. The right axilla was "full" on palpation. Ultrasound showed a 5.4 cm mass at the 5:00 position of the right breast 7 cm from the nipple, with at least 3 enlarged abnormal axillary lymph nodes, the largest measuring 3.7 cm.  Biopsy of the right breast mass and one axillary lymph node on 01/06/2016 showed ([id number]) both to be positive for an invasive ductal carcinoma, grade 3, triple negative, with the HER-2 signals ratio between 1.21-1.40 and the number per cell 2.85-3.00. The MIB-1-2 was 70%.  The patient's subsequent history is as detailed below   PAST MEDICAL HISTORY: Past Medical History:  Diagnosis Date   Arthritis    Asthma    rarely uses neb   Breast cancer ([place]) 05/25/2016   right breast   Breast cancer of lower-inner quadrant of right female breast ([place]) 01/22/2016   Carpal tunnel syndrome    Coronary artery disease    DES to OM3 90% stenosis 2003, 90% small RCA stenosis.     Diabetes mellitus    not taken glipizide in over a year, checks surgars daily   History of stomach ulcers    Hyperlipemia    Hypertension    Irregular heart beat    Vascular dementia ([place])     PAST SURGICAL HISTORY: Past Surgical History:  Procedure Laterality  Date   ABDOMINAL HYSTERECTOMY     CARDIAC CATHETERIZATION  2003   stent-   CARPAL TUNNEL RELEASE  03/29/2012   Procedure: CARPAL TUNNEL RELEASE;  Surgeon: Wynonia Sours, MD;  Location: [facility];  Service: Orthopedics;  Laterality: Left;   EYE SURGERY     catracts   LESION EXCISION WITH COMPLEX REPAIR Right 05/25/2016   Procedure: complex repair of 25cm wound;   Surgeon: Irene Limbo, MD;  Location: [facility];  Service: Plastics;  Laterality: Right;   MASTECTOMY     right   MASTECTOMY MODIFIED RADICAL Right 05/25/2016   Procedure: RIGHT MASTECTOMY MODIFIED RADICAL;  Surgeon: Fanny Skates, MD;  Location: [facility];  Service: General;  Laterality: Right;   PUNCH BIOPSY OF SKIN Left 04/04/2017   Procedure: PUNCH BIOPSY OF LEFT BREAST SKIN;  Surgeon: Fanny Skates, MD;  Location: [facility];  Service: General;  Laterality: Left;   RADIOACTIVE SEED GUIDED AXILLARY SENTINEL LYMPH NODE Left 04/04/2017   Procedure: RADIOACTIVE SEED TARGETED LEFT AXILLARY LYMPH NODE EXCISION;  Surgeon: Fanny Skates, MD;  Location: [facility];  Service: General;  Laterality: Left;   SHOULDER ARTHROSCOPY     right and left   TRIGGER FINGER RELEASE  03/29/2012   Procedure: RELEASE TRIGGER FINGER/A-1 PULLEY;  Surgeon: Wynonia Sours, MD;  Location: [facility];  Service: Orthopedics;  Laterality: Left;    FAMILY HISTORY Family History  Problem Relation Age of Onset   Heart disease Mother        No details   The patient's father died from "old age" at age 16. The patient's mother died at age 77 from complications of high blood pressure and diabetes. The patient had one brother, 4 sisters. There is no cancer in the family to her knowledge    GYNECOLOGIC HISTORY:  No LMP recorded. Patient has had a hysterectomy.  menarche age 4, first live birth age 72. The patient is GX P2. She underwent abdominal hysterectomy with bilateral salpingo-oophorectomy in her 22s. She did not undergo hormone replacement   SOCIAL HISTORY:  Pariss used to work for [employer]. She lives with her daughter Charmel Pronovost, who is Medical sales representative for [employer]. The patient's son Tinita Brooker lives in [place]. The patient has 1 grandchild, no great-grandchildren. She attends a local [place] DIRECTIVES:  not in place. At  the 01/22/2016 visit the patient was given the appropriate documents to complete. She tells me she intends to name her daughter [name] as her healthcare part of attorney    HEALTH MAINTENANCE: Social History   Tobacco Use   Smoking status: Former    Types: Cigarettes    Quit date: 03/24/1978    Years since quitting: 43.0   Smokeless tobacco: Never  Vaping Use   Vaping Use: Never used  Substance Use Topics   Alcohol use: No   Drug use: No     Colonoscopy: Never   PAP: status post hysterectomy   Bone density: 09/06/2017 with a T-score of -0.2   Allergies  Allergen Reactions   Lyrica [Pregabalin] Swelling    SWELLING REACTION UNSPECIFIED    Feldene [Piroxicam] Rash   Lisinopril Other (See Comments)    States that it makes her "sensitive to light" unknown   Orudis [Ketoprofen] Rash   Vibramycin [Doxycycline Calcium] Rash and Hives    Current Outpatient Medications  Medication Sig Dispense Refill   acetaminophen (TYLENOL) 500  MG tablet Take 500 mg by mouth every 4 (four) hours as needed for mild pain.     albuterol (PROVENTIL HFA;VENTOLIN HFA) 108 (90 BASE) MCG/ACT inhaler Inhale 1 puff into the lungs every 6 (six) hours as needed for wheezing or shortness of breath. Reported on 02/04/2016     aluminum-magnesium hydroxide-simethicone (MAALOX) 916-384-66 MG/5ML SUSP Take 30 mLs by mouth 3 (three) times daily as needed (acid).      aspirin EC 325 MG tablet Take 325 mg by mouth every morning.      atorvastatin (LIPITOR) 40 MG tablet TAKE 1 TABLET (40 MG TOTAL) BY MOUTH DAILY. 90 tablet 3   calcium carbonate (OS-CAL) 600 MG TABS Take 600 mg by mouth 2 (two) times daily with a meal.     diclofenac sodium (VOLTAREN) 1 % GEL Apply 1 application topically every other day.     donepezil (ARICEPT) 5 MG tablet Take 5 mg by mouth at bedtime.     ferrous sulfate 325 (65 FE) MG tablet Take 1 tablet (325 mg total) by mouth 2 (two) times daily with a meal. (Patient taking differently: Take 325  mg by mouth daily with breakfast.) 60 tablet 3   guaifenesin (ROBITUSSIN) 100 MG/5ML syrup Take 200 mg by mouth every 6 (six) hours as needed for cough.     hydrochlorothiazide (MICROZIDE) 12.5 MG capsule Take 12.5 mg by mouth daily.     loperamide (IMODIUM) 2 MG capsule Take 2 mg by mouth as needed for diarrhea or loose stools.     magnesium hydroxide (MILK OF MAGNESIA) 400 MG/5ML suspension Take 30 mLs by mouth at bedtime as needed for mild constipation.     metroNIDAZOLE (FLAGYL) 500 MG tablet Take 1 tablet (500 mg total) by mouth 2 (two) times daily. 14 tablet 0   olmesartan (BENICAR) 40 MG tablet Take 40 mg by mouth daily.     ondansetron (ZOFRAN) 4 MG tablet Take 1 tablet (4 mg total) by mouth every 6 (six) hours. 12 tablet 0   potassium chloride SA (KLOR-CON) 20 MEQ tablet Take 20 mEq by mouth daily.     simvastatin (ZOCOR) 20 MG tablet Take 20 mg by mouth daily.     No current facility-administered medications for this visit.    OBJECTIVE:   There were no vitals filed for this visit.    There is no height or weight on file to calculate BMI.   ECOG FS:2   LAB RESULTS:  CMP     Component Value Date/Time   NA 139 10/04/2020 1201   NA 143 05/04/2017 1418   K 4.9 10/04/2020 1201   K 4.2 05/04/2017 1418   CL 108 10/04/2020 1201   CO2 22 10/04/2020 1201   CO2 28 05/04/2017 1418   GLUCOSE 88 10/04/2020 1201   GLUCOSE 125 05/04/2017 1418   BUN 31 (H) 10/04/2020 1201   BUN 22.7 05/04/2017 1418   CREATININE 1.24 (H) 10/04/2020 1201   CREATININE 1.37 (H) 06/23/2018 1051   CREATININE 1.1 05/04/2017 1418   CALCIUM 9.6 10/04/2020 1201   CALCIUM 9.3 05/04/2017 1418   PROT 8.3 (H) 10/04/2020 1201   PROT 7.2 05/04/2017 1418   ALBUMIN 4.5 10/04/2020 1201   ALBUMIN 3.3 (L) 05/04/2017 1418   AST 25 10/04/2020 1201   AST 16 06/23/2018 1051   AST 20 05/04/2017 1418   ALT 13 10/04/2020 1201   ALT 7 06/23/2018 1051   ALT 11 05/04/2017 1418   ALKPHOS 64 10/04/2020 1201  ALKPHOS 67  05/04/2017 1418   BILITOT 0.7 10/04/2020 1201   BILITOT 0.3 06/23/2018 1051   BILITOT 0.32 05/04/2017 1418   GFRNONAA 42 (L) 10/04/2020 1201   GFRNONAA 35 (L) 06/23/2018 1051   GFRAA 44 (L) 09/28/2019 1442   GFRAA 41 (L) 06/23/2018 1051    INo results found for: SPEP, UPEP  Lab Results  Component Value Date   WBC 5.4 10/04/2020   NEUTROABS 2.5 10/04/2020   HGB 11.3 (L) 10/04/2020   HCT 37.0 10/04/2020   MCV 94.1 10/04/2020   PLT 209 10/04/2020      Chemistry      Component Value Date/Time   NA 139 10/04/2020 1201   NA 143 05/04/2017 1418   K 4.9 10/04/2020 1201   K 4.2 05/04/2017 1418   CL 108 10/04/2020 1201   CO2 22 10/04/2020 1201   CO2 28 05/04/2017 1418   BUN 31 (H) 10/04/2020 1201   BUN 22.7 05/04/2017 1418   CREATININE 1.24 (H) 10/04/2020 1201   CREATININE 1.37 (H) 06/23/2018 1051   CREATININE 1.1 05/04/2017 1418      Component Value Date/Time   CALCIUM 9.6 10/04/2020 1201   CALCIUM 9.3 05/04/2017 1418   ALKPHOS 64 10/04/2020 1201   ALKPHOS 67 05/04/2017 1418   AST 25 10/04/2020 1201   AST 16 06/23/2018 1051   AST 20 05/04/2017 1418   ALT 13 10/04/2020 1201   ALT 7 06/23/2018 1051   ALT 11 05/04/2017 1418   BILITOT 0.7 10/04/2020 1201   BILITOT 0.3 06/23/2018 1051   BILITOT 0.32 05/04/2017 1418       No results found for: LABCA2  No components found for: LABCA125  No results for input(s): INR in the last 168 hours.  Urinalysis    Component Value Date/Time   COLORURINE YELLOW 05/19/2016 1138   APPEARANCEUR CLEAR 05/19/2016 1138   LABSPEC 1.017 05/19/2016 1138   PHURINE 5.5 05/19/2016 1138   GLUCOSEU NEGATIVE 05/19/2016 1138   HGBUR NEGATIVE 05/19/2016 1138   BILIRUBINUR NEGATIVE 05/19/2016 1138   KETONESUR NEGATIVE 05/19/2016 1138   PROTEINUR NEGATIVE 05/19/2016 1138   UROBILINOGEN 1.0 11/28/2013 0143   NITRITE NEGATIVE 05/19/2016 1138   LEUKOCYTESUR NEGATIVE 05/19/2016 1138    STUDIES: No results found.   ASSESSMENT: 87 y.o.  [place] woman status post right breast lower outer quadrant and right axillary lymph node biopsy 01/06/2016 both positive for a clinical T4 N1 M1, stage IV invasive ductal carcinoma, grade 3, triple negative, with an MIB-1 of 70%  (a) CT scan of the chest and bone scan 01/27/2016 showed scattered pulmonary nodules in addition to locally advanced disease  (b) repeat CT scan of the chest 04/12/2016 shows persistent pulmonary nodules, progression in local disease  (c) CA-27-29 is noninformative  (1) neoadjuvant chemotherapy consisting of capecitabine, 1500 mg twice daily, 7 days on 7 days off, starting 01/27/2016, discontinued 04/14/2016 with progression  (2) right modified radical mastectomy 05/25/2016 showed a pT3 pN2, stage IIIA invasive ductal carcinoma, grade 3, with negative margins.  (3) adjuvant radiation 08/24/16- 10/07/16  Site/dose:   1) Right chest wall / 50.4 Gy in 28 fractions                         2) Right chest wall boost / 10 Gy in 5 fractions  (4) restaging studies and follow-up:  (a) PET scan 08/24/2016 was negative except for a 1.0 cm LEFT axillary lymph node  (b)  biopsy  of the left axillary lymph node 02/22/2017 showed breast carcinoma, again triple negative   (5) on 04/04/2017 she underwent removal of the left axillary mass (measuring 4.8 cm,) together with 2 benign lymph nodes.  (a) skin punch biopsy on the same day was benign  (6) adjuvant radiation to the left axillary tumor bed completed 07/01/2017:  Site/dose:   Left axilla / 50 Gy in 25 fy  (7) monitoring studies: (a) CT chest on 10/27/2017 showed: No evidence of breast cancer recurrence.   (b) CT chest on 05/04/2018 showed: NED  (C) non-contrast head and neck Ct scan (following a fall) show no bone or soft-tissue metastases  PLAN: Anniebell did not show for her 03/30/2021 visit. A follow letter has been sent and she os being rescheduled as a virtual visit.  Virgie Dad. Sayre Witherington, MD 03/29/21 8:40 PM Medical  Oncology and Hematology [facility] [place], [place] [address] Tel. [phone]    Fax. [phone]   I, Wilburn Mylar, am acting as scribe for Dr. Virgie Dad. Kreg Earhart.  I, Lurline Del MD, have reviewed the above documentation for accuracy and completeness, and I agree with the above.   *Total Encounter Time as defined by the Centers for Medicare and Medicaid Services includes, in addition to the face-to-face time of a patient visit (documented in the note above) non-face-to-face time: obtaining and reviewing outside history, ordering and reviewing medications, tests or procedures, care coordination (communications with other health care professionals or caregivers) and documentation in the medical record.

## 2021-03-30 ENCOUNTER — Inpatient Hospital Stay: Payer: Medicare Other

## 2021-03-30 ENCOUNTER — Inpatient Hospital Stay: Payer: Medicare Other | Attending: Oncology | Admitting: Oncology

## 2021-03-30 DIAGNOSIS — C773 Secondary and unspecified malignant neoplasm of axilla and upper limb lymph nodes: Secondary | ICD-10-CM

## 2021-03-30 DIAGNOSIS — C50311 Malignant neoplasm of lower-inner quadrant of right female breast: Secondary | ICD-10-CM

## 2021-03-30 DIAGNOSIS — Z171 Estrogen receptor negative status [ER-]: Secondary | ICD-10-CM

## 2021-03-31 ENCOUNTER — Encounter: Payer: Self-pay | Admitting: Oncology

## 2021-04-07 ENCOUNTER — Ambulatory Visit: Payer: Medicare Other | Attending: Physician Assistant | Admitting: Rehabilitation

## 2021-04-07 ENCOUNTER — Encounter: Payer: Self-pay | Admitting: Rehabilitation

## 2021-04-07 ENCOUNTER — Other Ambulatory Visit: Payer: Self-pay

## 2021-04-07 DIAGNOSIS — I890 Lymphedema, not elsewhere classified: Secondary | ICD-10-CM | POA: Insufficient documentation

## 2021-04-07 DIAGNOSIS — Z171 Estrogen receptor negative status [ER-]: Secondary | ICD-10-CM | POA: Insufficient documentation

## 2021-04-07 DIAGNOSIS — C50311 Malignant neoplasm of lower-inner quadrant of right female breast: Secondary | ICD-10-CM | POA: Diagnosis present

## 2021-04-07 NOTE — Therapy (Signed)
[place], [place], [address] Phone: [phone]   Fax:  [phone]  Physical Therapy Evaluation  Patient Details  Name: Jennifer Garrett MRN: [id number] Date of Birth: 1932-02-27 Referring Provider (PT): Cornelia Kurth-Bowen PA-C   Encounter Date: 04/07/2021   PT End of Session - 04/07/21 1435     Visit Number 1    Number of Visits 1    Date for PT Re-Evaluation 04/07/21    PT Start Time 1400    PT Stop Time 9470    PT Time Calculation (min) 22 min    Activity Tolerance Patient tolerated treatment well    Behavior During Therapy [facility] for tasks assessed/performed             Past Medical History:  Diagnosis Date   Arthritis    Asthma    rarely uses neb   Breast cancer ([place]) 05/25/2016   right breast   Breast cancer of lower-inner quadrant of right female breast ([place]) 01/22/2016   Carpal tunnel syndrome    Coronary artery disease    DES to OM3 90% stenosis 2003, 90% small RCA stenosis.     Diabetes mellitus    not taken glipizide in over a year, checks surgars daily   History of stomach ulcers    Hyperlipemia    Hypertension    Irregular heart beat    Vascular dementia [facility])     Past Surgical History:  Procedure Laterality Date   ABDOMINAL HYSTERECTOMY     CARDIAC CATHETERIZATION  2003   stent-   CARPAL TUNNEL RELEASE  03/29/2012   Procedure: CARPAL TUNNEL RELEASE;  Surgeon: Wynonia Sours, MD;  Location: [facility];  Service: Orthopedics;  Laterality: Left;   EYE SURGERY     catracts   LESION EXCISION WITH COMPLEX REPAIR Right 05/25/2016   Procedure: complex repair of 25cm wound;  Surgeon: Irene Limbo, MD;  Location: [facility];  Service: Plastics;  Laterality: Right;   MASTECTOMY     right   MASTECTOMY MODIFIED RADICAL Right 05/25/2016   Procedure: RIGHT MASTECTOMY MODIFIED RADICAL;  Surgeon: Fanny Skates, MD;  Location: [facility];  Service: General;  Laterality: Right;    PUNCH BIOPSY OF SKIN Left 04/04/2017   Procedure: PUNCH BIOPSY OF LEFT BREAST SKIN;  Surgeon: Fanny Skates, MD;  Location: [facility];  Service: General;  Laterality: Left;   RADIOACTIVE SEED GUIDED AXILLARY SENTINEL LYMPH NODE Left 04/04/2017   Procedure: RADIOACTIVE SEED TARGETED LEFT AXILLARY LYMPH NODE EXCISION;  Surgeon: Fanny Skates, MD;  Location: [facility];  Service: General;  Laterality: Left;   SHOULDER ARTHROSCOPY     right and left   TRIGGER FINGER RELEASE  03/29/2012   Procedure: RELEASE TRIGGER FINGER/A-1 PULLEY;  Surgeon: Wynonia Sours, MD;  Location: [facility];  Service: Orthopedics;  Laterality: Left;    There were no vitals filed for this visit.    Subjective Assessment - 04/07/21 1354     Subjective pt will memory loss - questions answered by phone call from care provider and transportation provider    Patient is accompained by: --   caregiver from memory home   Pertinent History 2 strokes, poor recall and memory, bilateral triple negative breast cancer with mets to the lungs treated with Rt modified radical mastectomy and ALND of 21 nodes, Lt lumpectomy with removal of 2 nodes, bilateral radiation and chemotherapy    Patient Stated Goals get  a new sleeve    Currently in Pain? No/denies                [facility] PT Assessment - 04/07/21 0001       Assessment   Medical Diagnosis Rt UE lymphedema after modified radical mastectomy    Referring Provider (PT) Cornelia Kurth-Bowen PA-C    Onset Date/Surgical Date 05/25/16    Hand Dominance Right    Prior Therapy yes      Precautions   Precaution Comments cancer, lymphedema      Balance Screen   Has the patient fallen in the past 6 months --   unable to assess due to memory     [place] residence   memory care   Available Help at Discharge Available 24 hours/day      Prior Function   Level of Independence Needs assistance  with ADLs      Cognition   Overall Cognitive Status History of cognitive impairments - at baseline    Memory Impaired      Observation/Other Assessments   Observations pt comes in with caregiver from memory home; wearing tribute Honor Loh and glove only                      [place] from 12/06/2017 in [place]  Lymphedema Life Impact Scale Total Score 8.82 %       Objective measurements completed on examination: See above findings.       [facility] Adult PT Treatment/Exercise - 04/07/21 0001       Manual Therapy   Manual therapy comments Pt measured for new custom medi sleeve will remake same glove and order new tribute night per phone conversation with caregiver x 40mn                       PT Short Term Goals - 04/07/21 1438       PT SHORT TERM GOAL #1   Title Pt will be measured for new sleeve    Time 1    Period Days    Status New               PT Long Term Goals - 07/31/18 1608       PT LONG TERM GOAL #1   Title Pt will have reduction on right upper arm at 10 cm proximal to the olecranon by 3 cm    Status Achieved      PT LONG TERM GOAL #2   Title Son with be independent in donning and doffing day and nighttime compression garments for pt    Status Partially Met      PT LONG TERM GOAL #3   Title Pt will have appropriate day and nighttime compression garments    Status Partially Met      PT LONG TERM GOAL #4   Title pt will be independent with remedial exercises to improve lymphatic flow    Status Achieved                    Plan - 04/07/21 1436     Clinical Impression Statement Pt returns after 1.5 years away from the clinic with noticeable worsened memory and cognitive status.  Pt confused about living situation and current garment status etc.  Per phone call with caregiver she reported that she needs a smaller daytime sleeve and hopefully a new night  sleeve.  Other caregiver educated about not wearing the tribute jacket but that they can wear the tribute if wanted.    Stability/Clinical Decision Making Stable/Uncomplicated    Clinical Decision Making Low    Rehab Potential Fair    PT Frequency One time visit    PT Treatment/Interventions Compression bandaging;Manual lymph drainage;Manual techniques;Patient/family education;DME Instruction;Therapeutic exercise;Prosthetic Training    PT Next Visit Plan if pt returns assess garment fit    Consulted and Agree with Plan of Care Patient;Other (Comment)             Patient will benefit from skilled therapeutic intervention in order to improve the following deficits and impairments:  Decreased skin integrity, Increased edema  Visit Diagnosis: Lymphedema  Malignant neoplasm of lower-inner quadrant of right breast of female, estrogen receptor negative ([place])     Problem List Patient Active Problem List   Diagnosis Date Noted   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

## 2021-10-20 ENCOUNTER — Other Ambulatory Visit: Payer: Self-pay | Admitting: Physician Assistant

## 2021-10-20 DIAGNOSIS — N6320 Unspecified lump in the left breast, unspecified quadrant: Secondary | ICD-10-CM

## 2021-11-13 ENCOUNTER — Ambulatory Visit: Payer: Medicare Other

## 2021-11-13 ENCOUNTER — Ambulatory Visit
Admission: RE | Admit: 2021-11-13 | Discharge: 2021-11-13 | Disposition: A | Discharge status: Home / Self Care | Payer: Medicare Other | Source: Ambulatory Visit | Attending: Physician Assistant | Admitting: Physician Assistant

## 2021-11-13 DIAGNOSIS — N6320 Unspecified lump in the left breast, unspecified quadrant: Secondary | ICD-10-CM

## 2022-03-22 ENCOUNTER — Other Ambulatory Visit: Payer: Self-pay

## 2022-03-22 ENCOUNTER — Emergency Department (HOSPITAL_COMMUNITY): Payer: Medicare Other

## 2022-03-22 ENCOUNTER — Inpatient Hospital Stay (HOSPITAL_COMMUNITY)
Admission: EM | Admit: 2022-03-22 | Discharge: 2022-03-29 | DRG: 640 | Disposition: A | Discharge status: Skilled Nursing Facility | Payer: Medicare Other | Attending: Internal Medicine | Admitting: Internal Medicine

## 2022-03-22 ENCOUNTER — Observation Stay (HOSPITAL_COMMUNITY): Payer: Medicare Other

## 2022-03-22 DIAGNOSIS — R55 Syncope and collapse: Secondary | ICD-10-CM

## 2022-03-22 DIAGNOSIS — I10 Essential (primary) hypertension: Secondary | ICD-10-CM | POA: Diagnosis present

## 2022-03-22 DIAGNOSIS — Z9011 Acquired absence of right breast and nipple: Secondary | ICD-10-CM

## 2022-03-22 DIAGNOSIS — G9341 Metabolic encephalopathy: Secondary | ICD-10-CM | POA: Diagnosis present

## 2022-03-22 DIAGNOSIS — E861 Hypovolemia: Principal | ICD-10-CM | POA: Diagnosis present

## 2022-03-22 DIAGNOSIS — Z8249 Family history of ischemic heart disease and other diseases of the circulatory system: Secondary | ICD-10-CM

## 2022-03-22 DIAGNOSIS — Z7982 Long term (current) use of aspirin: Secondary | ICD-10-CM

## 2022-03-22 DIAGNOSIS — Z888 Allergy status to other drugs, medicaments and biological substances status: Secondary | ICD-10-CM

## 2022-03-22 DIAGNOSIS — I130 Hypertensive heart and chronic kidney disease with heart failure and stage 1 through stage 4 chronic kidney disease, or unspecified chronic kidney disease: Secondary | ICD-10-CM | POA: Diagnosis present

## 2022-03-22 DIAGNOSIS — R001 Bradycardia, unspecified: Secondary | ICD-10-CM | POA: Diagnosis present

## 2022-03-22 DIAGNOSIS — I2510 Atherosclerotic heart disease of native coronary artery without angina pectoris: Secondary | ICD-10-CM | POA: Diagnosis present

## 2022-03-22 DIAGNOSIS — F0150 Vascular dementia without behavioral disturbance: Secondary | ICD-10-CM

## 2022-03-22 DIAGNOSIS — I5032 Chronic diastolic (congestive) heart failure: Secondary | ICD-10-CM | POA: Diagnosis present

## 2022-03-22 DIAGNOSIS — G9349 Other encephalopathy: Secondary | ICD-10-CM | POA: Diagnosis present

## 2022-03-22 DIAGNOSIS — N184 Chronic kidney disease, stage 4 (severe): Secondary | ICD-10-CM

## 2022-03-22 DIAGNOSIS — N179 Acute kidney failure, unspecified: Secondary | ICD-10-CM

## 2022-03-22 DIAGNOSIS — E118 Type 2 diabetes mellitus with unspecified complications: Secondary | ICD-10-CM

## 2022-03-22 DIAGNOSIS — Y92129 Unspecified place in nursing home as the place of occurrence of the external cause: Secondary | ICD-10-CM

## 2022-03-22 DIAGNOSIS — W19XXXA Unspecified fall, initial encounter: Secondary | ICD-10-CM | POA: Diagnosis present

## 2022-03-22 DIAGNOSIS — Z87891 Personal history of nicotine dependence: Secondary | ICD-10-CM

## 2022-03-22 DIAGNOSIS — R569 Unspecified convulsions: Secondary | ICD-10-CM

## 2022-03-22 DIAGNOSIS — E1122 Type 2 diabetes mellitus with diabetic chronic kidney disease: Secondary | ICD-10-CM | POA: Diagnosis present

## 2022-03-22 DIAGNOSIS — E785 Hyperlipidemia, unspecified: Secondary | ICD-10-CM | POA: Diagnosis present

## 2022-03-22 DIAGNOSIS — N1832 Chronic kidney disease, stage 3b: Secondary | ICD-10-CM | POA: Diagnosis present

## 2022-03-22 DIAGNOSIS — D649 Anemia, unspecified: Secondary | ICD-10-CM

## 2022-03-22 DIAGNOSIS — F05 Delirium due to known physiological condition: Secondary | ICD-10-CM | POA: Diagnosis present

## 2022-03-22 DIAGNOSIS — Z853 Personal history of malignant neoplasm of breast: Secondary | ICD-10-CM

## 2022-03-22 LAB — DIFFERENTIAL
Abs Immature Granulocytes: 0.01 10*3/uL (ref 0.00–0.07)
Basophils Absolute: 0.1 10*3/uL (ref 0.0–0.1)
Basophils Relative: 1 %
Eosinophils Absolute: 0.1 10*3/uL (ref 0.0–0.5)
Eosinophils Relative: 2 %
Immature Granulocytes: 0 %
Lymphocytes Relative: 20 %
Lymphs Abs: 1.2 10*3/uL (ref 0.7–4.0)
Monocytes Absolute: 0.6 10*3/uL (ref 0.1–1.0)
Monocytes Relative: 10 %
Neutro Abs: 3.9 10*3/uL (ref 1.7–7.7)
Neutrophils Relative %: 67 %

## 2022-03-22 LAB — VITAMIN B12: Vitamin B-12: 262 pg/mL (ref 180–914)

## 2022-03-22 LAB — COMPREHENSIVE METABOLIC PANEL
ALT: 8 U/L (ref 0–44)
AST: 19 U/L (ref 15–41)
Albumin: 3.2 g/dL — ABNORMAL LOW (ref 3.5–5.0)
Alkaline Phosphatase: 69 U/L (ref 38–126)
Anion gap: 10 (ref 5–15)
BUN: 26 mg/dL — ABNORMAL HIGH (ref 8–23)
CO2: 22 mmol/L (ref 22–32)
Calcium: 9.4 mg/dL (ref 8.9–10.3)
Chloride: 106 mmol/L (ref 98–111)
Creatinine, Ser: 1.67 mg/dL — ABNORMAL HIGH (ref 0.44–1.00)
GFR, Estimated: 29 mL/min — ABNORMAL LOW (ref >60)
Glucose, Bld: 120 mg/dL — ABNORMAL HIGH (ref 70–99)
Potassium: 5.1 mmol/L (ref 3.5–5.1)
Sodium: 138 mmol/L (ref 135–145)
Total Bilirubin: 0.4 mg/dL (ref 0.3–1.2)
Total Protein: 6.5 g/dL (ref 6.5–8.1)

## 2022-03-22 LAB — I-STAT CHEM 8, ED
BUN: 26 mg/dL — ABNORMAL HIGH (ref 8–23)
Calcium, Ion: 1.01 mmol/L — ABNORMAL LOW (ref 1.15–1.40)
Chloride: 109 mmol/L (ref 98–111)
Creatinine, Ser: 1.7 mg/dL — ABNORMAL HIGH (ref 0.44–1.00)
Glucose, Bld: 112 mg/dL — ABNORMAL HIGH (ref 70–99)
HCT: 32 % — ABNORMAL LOW (ref 36.0–46.0)
Hemoglobin: 10.9 g/dL — ABNORMAL LOW (ref 12.0–15.0)
Potassium: 4.9 mmol/L (ref 3.5–5.1)
Sodium: 136 mmol/L (ref 135–145)
TCO2: 19 mmol/L — ABNORMAL LOW (ref 22–32)

## 2022-03-22 LAB — GLUCOSE, CAPILLARY: Glucose-Capillary: 91 mg/dL (ref 70–99)

## 2022-03-22 LAB — PROTIME-INR
INR: 1.1 (ref 0.8–1.2)
Prothrombin Time: 14.6 seconds (ref 11.4–15.2)

## 2022-03-22 LAB — CBC
HCT: 33.2 % — ABNORMAL LOW (ref 36.0–46.0)
Hemoglobin: 10.2 g/dL — ABNORMAL LOW (ref 12.0–15.0)
MCH: 28.2 pg (ref 26.0–34.0)
MCHC: 30.7 g/dL (ref 30.0–36.0)
MCV: 91.7 fL (ref 80.0–100.0)
Platelets: 235 10*3/uL (ref 150–400)
RBC: 3.62 MIL/uL — ABNORMAL LOW (ref 3.87–5.11)
RDW: 12 % (ref 11.5–15.5)
WBC: 5.8 10*3/uL (ref 4.0–10.5)
nRBC: 0 % (ref 0.0–0.2)

## 2022-03-22 LAB — APTT: aPTT: 29 seconds (ref 24–36)

## 2022-03-22 LAB — TROPONIN I (HIGH SENSITIVITY)
Troponin I (High Sensitivity): 12 ng/L (ref <18)
Troponin I (High Sensitivity): 13 ng/L (ref <18)

## 2022-03-22 LAB — HEMOGLOBIN A1C
Hgb A1c MFr Bld: 5.9 % — ABNORMAL HIGH (ref 4.8–5.6)
Mean Plasma Glucose: 122.63 mg/dL

## 2022-03-22 LAB — CK: Total CK: 64 U/L (ref 38–234)

## 2022-03-22 LAB — TSH: TSH: 0.54 u[IU]/mL (ref 0.350–4.500)

## 2022-03-22 LAB — MAGNESIUM: Magnesium: 2 mg/dL (ref 1.7–2.4)

## 2022-03-22 MED ORDER — SODIUM CHLORIDE 0.9 % IV SOLN
100.0000 mL/h | INTRAVENOUS | Status: DC
  Administered 2022-03-22: 100 mL/h via INTRAVENOUS

## 2022-03-22 MED ORDER — SODIUM CHLORIDE 0.9 % IV SOLN: INTRAVENOUS | Status: AC

## 2022-03-22 MED ORDER — SODIUM CHLORIDE 0.9% FLUSH
3.0000 mL | Freq: Two times a day (BID) | INTRAVENOUS | Status: DC
  Administered 2022-03-22 – 2022-03-25 (×6): 3 mL via INTRAVENOUS

## 2022-03-22 MED ORDER — SODIUM CHLORIDE 0.9 % IV BOLUS
500.0000 mL | Freq: Once | INTRAVENOUS | Status: AC
Start: 2022-03-22 — End: 2022-03-22
  Administered 2022-03-22: 500 mL via INTRAVENOUS

## 2022-03-22 MED ORDER — ACETAMINOPHEN 325 MG PO TABS: 650.0000 mg | ORAL_TABLET | Freq: Four times a day (QID) | ORAL | Status: DC | PRN

## 2022-03-22 MED ORDER — ENOXAPARIN SODIUM 30 MG/0.3ML IJ SOSY
30.0000 mg | PREFILLED_SYRINGE | INTRAMUSCULAR | Status: DC
  Administered 2022-03-22 – 2022-03-29 (×7): 30 mg via SUBCUTANEOUS
  Filled 2022-03-22 (×7): qty 0.3

## 2022-03-22 MED ORDER — LORAZEPAM 0.5 MG PO TABS
0.5000 mg | ORAL_TABLET | Freq: Once | ORAL | Status: AC | PRN
  Administered 2022-03-22: 0.5 mg via ORAL
  Filled 2022-03-22: qty 1

## 2022-03-22 MED ORDER — INSULIN ASPART 100 UNIT/ML IJ SOLN: 0.0000 [IU] | Freq: Three times a day (TID) | INTRAMUSCULAR | Status: DC

## 2022-03-22 MED ORDER — ACETAMINOPHEN 650 MG RE SUPP: 650.0000 mg | Freq: Four times a day (QID) | RECTAL | Status: DC | PRN

## 2022-03-22 NOTE — Assessment & Plan Note (Signed)
At baseline Continue to monitor  Checking b12

## 2022-03-22 NOTE — ED Provider Notes (Signed)
[facility] EMERGENCY DEPARTMENT Provider Note   CSN: [id number] Arrival date & time: 03/22/22  1057     History  Chief Complaint  Patient presents with   Seizures    Jennifer Garrett is a 88 y.o. female.   Seizures    Patient has a history of hypertension, arthritis, hyperlipidemia, breast cancer, irregular heartbeat, coronary artery disease, diabetes, dementia who presents to the ED for evaluation after an episode of an unwitnessed fall.  Patient reportedly was observed being unresponsive and having her eyes rolled back.  Jennifer Garrett apparently had some possible seizure-like activity.  When EMS arrived they noted that Jennifer Garrett was bradycardic with a heart rate of 46.  Jennifer Garrett was given a milligram of atropine.  Denies any complaints at this time.  Jennifer Garrett does not recall the event earlier.  Jennifer Garrett denies having any headache or chest pain.  Jennifer Garrett denies trouble with her breathing.  Jennifer Garrett denies any pain nausea or vomiting  Home Medications Prior to Admission medications   Medication Sig Start Date End Date Taking? Authorizing Provider  acetaminophen (TYLENOL) 500 MG tablet Take 500 mg by mouth every 4 (four) hours as needed for mild pain.    [provider]  albuterol (PROVENTIL HFA;VENTOLIN HFA) 108 (90 BASE) MCG/ACT inhaler Inhale 1 puff into the lungs every 6 (six) hours as needed for wheezing or shortness of breath. Reported on 02/04/2016    [provider]  aluminum-magnesium hydroxide-simethicone (MAALOX) [phone] MG/5ML SUSP Take 30 mLs by mouth 3 (three) times daily as needed (acid).     [provider]  aspirin EC 325 MG tablet Take 325 mg by mouth every morning.     [provider]  atorvastatin (LIPITOR) 40 MG tablet TAKE 1 TABLET (40 MG TOTAL) BY MOUTH DAILY. 04/14/17 07/13/17  Minus Breeding, MD  calcium carbonate (OS-CAL) 600 MG TABS Take 600 mg by mouth 2 (two) times daily with a meal.    [provider]  diclofenac sodium (VOLTAREN) 1 %  GEL Apply 1 application topically every other day. 01/08/13   [provider]  donepezil (ARICEPT) 5 MG tablet Take 5 mg by mouth at bedtime. 03/14/16   [provider]  ferrous sulfate 325 (65 FE) MG tablet Take 1 tablet (325 mg total) by mouth 2 (two) times daily with a meal. Patient taking differently: Take 325 mg by mouth daily with breakfast. 05/27/16   Fanny Skates, MD  guaifenesin (ROBITUSSIN) 100 MG/5ML syrup Take 200 mg by mouth every 6 (six) hours as needed for cough.    [provider]  hydrochlorothiazide (MICROZIDE) 12.5 MG capsule Take 12.5 mg by mouth daily. 04/17/16   [provider]  loperamide (IMODIUM) 2 MG capsule Take 2 mg by mouth as needed for diarrhea or loose stools.    [provider]  magnesium hydroxide (MILK OF MAGNESIA) 400 MG/5ML suspension Take 30 mLs by mouth at bedtime as needed for mild constipation.    [provider]  metroNIDAZOLE (FLAGYL) 500 MG tablet Take 1 tablet (500 mg total) by mouth 2 (two) times daily. 09/28/19   Valarie Merino, MD  olmesartan (BENICAR) 40 MG tablet Take 40 mg by mouth daily.    [provider]  ondansetron (ZOFRAN) 4 MG tablet Take 1 tablet (4 mg total) by mouth every 6 (six) hours. 09/28/19   Valarie Merino, MD  potassium chloride SA (KLOR-CON) 20 MEQ tablet Take 20 mEq by mouth daily.    [provider]  simvastatin (ZOCOR) 20 MG tablet Take 20 mg by mouth daily.    [provider]      Allergies    Lyrica [pregabalin], Feldene [piroxicam], Lisinopril, Orudis [ketoprofen], and Vibramycin [doxycycline calcium]    Review of Systems   Review of Systems  Neurological:  Positive for seizures.    Physical Exam Updated Vital Signs BP 104/71   Pulse (!) 50   Temp 97.9 F (36.6 C) (Oral)   Resp 12   SpO2 98%  Physical Exam Vitals and nursing note reviewed.  Constitutional:      Appearance: Jennifer Garrett is well-developed.     Comments: Elderly, frail   HENT:     Head: Normocephalic and atraumatic.     Right Ear: External ear normal.     Left Ear: External ear normal.  Eyes:     General: No scleral icterus.       Right eye: No discharge.        Left eye: No discharge.     Conjunctiva/sclera: Conjunctivae normal.  Neck:     Trachea: No tracheal deviation.  Cardiovascular:     Rate and Rhythm: Normal rate and regular rhythm.  Pulmonary:     Effort: Pulmonary effort is normal. No respiratory distress.     Breath sounds: Normal breath sounds. No stridor. No wheezing or rales.  Abdominal:     General: Bowel sounds are normal. There is no distension.     Palpations: Abdomen is soft.     Tenderness: There is no abdominal tenderness. There is no guarding or rebound.  Musculoskeletal:        General: No deformity.     Cervical back: Neck supple. Tenderness present.     Thoracic back: Tenderness present.     Lumbar back: No tenderness.  Skin:    General: Skin is warm and dry.     Findings: No rash.  Neurological:     General: No focal deficit present.     Mental Status: Jennifer Garrett is alert.     Cranial Nerves: No cranial nerve deficit (no facial droop, extraocular movements intact, no slurred speech), dysarthria or facial asymmetry.     Sensory: No sensory deficit.     Motor: No abnormal muscle tone or seizure activity.     Coordination: Coordination normal.     Comments: Patient is able to grip my hands equally bilaterally, is able to lift her legs off the bed bilaterally, normal sensation throughout does appear generally weak and frail  Psychiatric:        Mood and Affect: Mood normal.     ED Results / Procedures / Treatments   Labs (all labs ordered are listed, but only abnormal results are displayed) Labs Reviewed  CBC - Abnormal; Notable for the following components:      Result Value   RBC 3.62 (*)    Hemoglobin 10.2 (*)    HCT 33.2 (*)    All other components within normal limits  COMPREHENSIVE METABOLIC PANEL - Abnormal;  Notable for the following components:   Glucose, Bld 120 (*)    BUN 26 (*)    Creatinine, Ser 1.67 (*)    Albumin 3.2 (*)    GFR, Estimated 29 (*)    All other components within normal limits  I-STAT CHEM 8, ED - Abnormal; Notable for the following components:   BUN 26 (*)    Creatinine, Ser 1.70 (*)    Glucose, Bld 112 (*)  Calcium, Ion 1.01 (*)    TCO2 19 (*)    Hemoglobin 10.9 (*)    HCT 32.0 (*)    All other components within normal limits  DIFFERENTIAL  URINALYSIS, ROUTINE W REFLEX MICROSCOPIC  PROTIME-INR  APTT  MAGNESIUM  CK  TSH  TROPONIN I (HIGH SENSITIVITY)  TROPONIN I (HIGH SENSITIVITY)    EKG EKG Interpretation  Date/Time:  Monday March 22 2022 11:07:30 EDT Ventricular Rate:  57 PR Interval:  172 QRS Duration: 112 QT Interval:  480 QTC Calculation: 468 R Axis:   -56 Text Interpretation: Sinus rhythm Incomplete left bundle branch block LVH with secondary repolarization abnormality Anterior Q waves, possibly due to LVH No significant change since last tracing Confirmed by Dorie Rank [id number]) on 03/22/2022 11:34:31 AM  Radiology DG Lumbar Spine 2-3 Views  Result Date: 03/22/2022 CLINICAL DATA:  Trauma, fall, pain EXAM: LUMBAR SPINE - 2-3 VIEW COMPARISON:  None Available. FINDINGS: No recent fracture is seen. There is no significant disc space narrowing. There is first-degree anterolisthesis at L3-L4 level. Degenerative changes are noted in facet joints, mostly in the lower lumbar spine. Arterial calcifications are seen in soft tissues. IMPRESSION: No recent fracture is seen in the lumbar spine. There is first-degree anterolisthesis at L3-L4 level. Lumbar spondylosis. Electronically Signed   By: Elmer Picker M.D.   On: 03/22/2022 12:18   DG Chest 1 View  Result Date: 03/22/2022 CLINICAL DATA:  Back pain, fall EXAM: CHEST  1 VIEW COMPARISON:  10/06/2019 FINDINGS: Transverse diameter of heart is increased. Thoracic aorta is tortuous. Lung fields are clear  of any infiltrate or pulmonary edema. There is no pleural effusion or pneumothorax. Surgical clips are seen in chest wall. Old healed fracture is seen in the posterolateral aspect of left sixth rib. IMPRESSION: Cardiomegaly. There are no signs of pulmonary edema or focal pulmonary consolidation. Electronically Signed   By: Elmer Picker M.D.   On: 03/22/2022 12:16   DG Thoracic Spine 2 View  Result Date: 03/22/2022 CLINICAL DATA:  Seizures.  Fall with back pain. EXAM: THORACIC SPINE 2 VIEWS COMPARISON:  None Available. FINDINGS: No evidence for an acute fracture. No subluxation. Intervertebral disc spaces are preserved. Of note, T1 and T2 have not been well visualized on lateral imaging. No abnormal paraspinal line to suggest paraspinal hematoma. Bones are diffusely demineralized. IMPRESSION: 1. No acute bony abnormality. 2. Of note, T1 and T2 have not been well visualized on lateral imaging. If there is high clinical index of concern for injury at the T1-2 level, CT imaging recommended to further evaluate. Electronically Signed   By: Misty Stanley M.D.   On: 03/22/2022 12:15   CT HEAD WO CONTRAST  Result Date: 03/22/2022 CLINICAL DATA:  Neuro deficit, acute, stroke suspected; Neck trauma (Age >= 65y). Un witnessed fall. Possible seizure activity. EXAM: CT HEAD WITHOUT CONTRAST CT CERVICAL SPINE WITHOUT CONTRAST TECHNIQUE: Multidetector CT imaging of the head and cervical spine was performed following the standard protocol without intravenous contrast. Multiplanar CT image reconstructions of the cervical spine were also generated. RADIATION DOSE REDUCTION: This exam was performed according to the departmental dose-optimization program which includes automated exposure control, adjustment of the mA and/or kV according to patient size and/or use of iterative reconstruction technique. COMPARISON:  CT head and cervical spine 10/04/2020 FINDINGS: CT HEAD FINDINGS Brain: There is no evidence of an acute  infarct, intracranial hemorrhage, mass, midline shift, or extra-axial fluid collection. Mild-to-moderate cerebral atrophy appears greatest in the parieto-occipital regions. Hypodensities in  the cerebral white matter bilaterally are unchanged and nonspecific but compatible with moderate chronic small vessel ischemic disease. Lacunar infarcts in the thalami are more conspicuous today but were likely present on the prior study and are therefore favored to be chronic. Vascular: Calcified atherosclerosis at the skull base. No hyperdense vessel. Skull: No fracture or suspicious osseous lesion. Sinuses/Orbits: Visualized paranasal sinuses and mastoid air cells are clear. Bilateral cataract extraction. Other: None. CT CERVICAL SPINE FINDINGS Alignment: Normal. Skull base and vertebrae: No acute fracture or suspicious osseous lesion. Soft tissues and spinal canal: No prevertebral fluid or swelling. No visible canal hematoma. Disc levels: Similar appearance of multilevel disc degeneration, greatest at C5-6 where there is likely mild spinal stenosis and mild left neural foraminal stenosis. Moderate multilevel facet arthrosis. Upper chest: Mild centrilobular emphysema in the lung apices. IMPRESSION: 1. No evidence of acute intracranial abnormality. 2. Moderate chronic small vessel ischemic disease. 3. No acute cervical spine fracture. Electronically Signed   By: Logan Bores M.D.   On: 03/22/2022 12:04   CT CERVICAL SPINE WO CONTRAST  Result Date: 03/22/2022 CLINICAL DATA:  Neuro deficit, acute, stroke suspected; Neck trauma (Age >= 65y). Un witnessed fall. Possible seizure activity. EXAM: CT HEAD WITHOUT CONTRAST CT CERVICAL SPINE WITHOUT CONTRAST TECHNIQUE: Multidetector CT imaging of the head and cervical spine was performed following the standard protocol without intravenous contrast. Multiplanar CT image reconstructions of the cervical spine were also generated. RADIATION DOSE REDUCTION: This exam was performed  according to the departmental dose-optimization program which includes automated exposure control, adjustment of the mA and/or kV according to patient size and/or use of iterative reconstruction technique. COMPARISON:  CT head and cervical spine 10/04/2020 FINDINGS: CT HEAD FINDINGS Brain: There is no evidence of an acute infarct, intracranial hemorrhage, mass, midline shift, or extra-axial fluid collection. Mild-to-moderate cerebral atrophy appears greatest in the parieto-occipital regions. Hypodensities in the cerebral white matter bilaterally are unchanged and nonspecific but compatible with moderate chronic small vessel ischemic disease. Lacunar infarcts in the thalami are more conspicuous today but were likely present on the prior study and are therefore favored to be chronic. Vascular: Calcified atherosclerosis at the skull base. No hyperdense vessel. Skull: No fracture or suspicious osseous lesion. Sinuses/Orbits: Visualized paranasal sinuses and mastoid air cells are clear. Bilateral cataract extraction. Other: None. CT CERVICAL SPINE FINDINGS Alignment: Normal. Skull base and vertebrae: No acute fracture or suspicious osseous lesion. Soft tissues and spinal canal: No prevertebral fluid or swelling. No visible canal hematoma. Disc levels: Similar appearance of multilevel disc degeneration, greatest at C5-6 where there is likely mild spinal stenosis and mild left neural foraminal stenosis. Moderate multilevel facet arthrosis. Upper chest: Mild centrilobular emphysema in the lung apices. IMPRESSION: 1. No evidence of acute intracranial abnormality. 2. Moderate chronic small vessel ischemic disease. 3. No acute cervical spine fracture. Electronically Signed   By: Logan Bores M.D.   On: 03/22/2022 12:04    Procedures Procedures    Medications Ordered in ED Medications  sodium chloride 0.9 % bolus 500 mL (500 mLs Intravenous New Bag/Given 03/22/22 1206)    Followed by  0.9 %  sodium chloride infusion  (100 mL/hr Intravenous New Bag/Given 03/22/22 1206)    ED Course/ Medical Decision Making/ A&P Clinical Course as of 03/22/22 1251  Mon Mar 22, 2022  1230 Comprehensive metabolic panel(!) Slight increase in creatinine compared to previous [JK]  1230 Troponin I (High Sensitivity) Troponin normal [JK]  1230 CBC(!) Hemoglobin decreased creased compared to last value  but similar to previous [JK]  1231 No acute findings noted head CT C-spine CT.  No definite fracture or spinal films [JK]  1249 Case discussed with Dr. Eliberto Ivory regarding admission [JK]    Clinical Course User Index [JK] Dorie Rank, MD                           Medical Decision Making Problems Addressed: Anemia, unspecified type: chronic illness or injury Bradycardia: acute illness or injury Chronic kidney disease (CKD), stage IV (severe) ([place]): chronic illness or injury Seizure-like activity ([place]): acute illness or injury Syncope, unspecified syncope type: acute illness or injury  Amount and/or Complexity of Data Reviewed Labs: ordered. Decision-making details documented in ED Course. Radiology: ordered and independent interpretation performed.  Risk Prescription drug management. Decision regarding hospitalization. Risk Details: Patient presented to the ER for evaluation after an episode of unresponsiveness.  Symptoms concerning for the possibility of seizure versus syncope.  With her bradycardia I favor the latter.  No signs of heart block in the ED.  Patient has remained on the cardiac monitor.  No signs of any serious injury associated with her fall.  Labs notable for some slight increase in her creatinine.  Jennifer Garrett is mildly anemic.  I doubt these are acutely related to her symptoms today.  We will consult with medical service for admission and further evaluation, cardiac monitoring possibly EEG          Final Clinical Impression(s) / ED Diagnoses Final diagnoses:  Seizure-like activity ([place])  Bradycardia  Syncope,  unspecified syncope type  Chronic kidney disease (CKD), stage IV (severe) ([facility])  Anemia, unspecified type    Rx / DC Orders ED Discharge Orders     None         Dorie Rank, MD 03/22/22 1251

## 2022-03-22 NOTE — Progress Notes (Signed)
2D echo attempted, but patient en route to room

## 2022-03-22 NOTE — Assessment & Plan Note (Signed)
Only have records from 2022 and creatinine was around 1.2.  Unsure if worsening renal function vs. AKI Check UA Strict I/O Hold nephrotoxic drugs Gentle IVF Trend

## 2022-03-22 NOTE — Assessment & Plan Note (Signed)
Syncope in setting of bradycardia Troponin wnl x 1 Echo pending Continue medical management with ASA/statin

## 2022-03-22 NOTE — Assessment & Plan Note (Signed)
Continue statin. 

## 2022-03-22 NOTE — Assessment & Plan Note (Addendum)
88 year old presenting with unwitnessed fall and seizure like activity with heart rate of 46 -obs to progressive -received atropine with EMS, heart rate has been in 70s -telemetry  -frequent neuro checks  -cardiogenic vs. orthostasis vs. Seizure -CT head with no acute finding -initial troponin wnl  -echo pending -blood pressures soft, checking orthostatics, holding anti hypertensive meds, TED hose -routine EEG -check TSH, mag, CK, B12 f/u on UA -seizure and fall precautions  -PT/OT -gentle IVF

## 2022-03-22 NOTE — ED Notes (Signed)
RN cleaned pt up... new gown applied and mittens

## 2022-03-22 NOTE — Assessment & Plan Note (Addendum)
Continue aricept (mar not done to verify)  Delirium precautions

## 2022-03-22 NOTE — ED Notes (Signed)
Patient is resting comfortably. 

## 2022-03-22 NOTE — ED Notes (Signed)
Patient transported to CT 

## 2022-03-22 NOTE — H&P (Addendum)
History and Physical    Patient: Jennifer Garrett DOB: March 11, 1932 DOA: 03/22/2022 DOS: the patient was seen and examined on 03/22/2022 PCP: Nolene Ebbs, MD  Patient coming from: SNF - [facility]    Chief Complaint: syncope with seizure like activity   HPI: Jennifer Garrett is a 88 y.o. female with medical history significant of hx of breast cancer s/p mastectomy, CAD, T2DM, HTN, HLD, vascular dementia with unwitnessed fall at SNF and seizure like activity. When EMS arrived she was bradycardic to 46 bpm and EMS gave atropine. She is in bed and pleasantly confused. Can not tell me anything about todays events. She denies any current headache, dizziness, chest pain, palpitations, stomach pain.    She does not smoke or drink  ER Course:  vitals: afebrile, bp: 120/51, HR; 54, RR: 18, oxygen: 100%RA Pertinent labs: hgb: 10.2, BUN: 26, creatinine: 1.67,  CXR: cardiomegaly, no other acute finding CT head: no acute finding CT cervical: no acute finding  In ED: given 500cc bolus and IVF. TRH asked to admit.    Review of Systems: unable to review all systems due to the inability of the patient to answer questions. Past Medical History:  Diagnosis Date   Arthritis    Asthma    rarely uses neb   Breast cancer ([place]) 05/25/2016   right breast   Breast cancer of lower-inner quadrant of right female breast ([place]) 01/22/2016   Carpal tunnel syndrome    Coronary artery disease    DES to OM3 90% stenosis 2003, 90% small RCA stenosis.     Diabetes mellitus    not taken glipizide in over a year, checks surgars daily   History of stomach ulcers    Hyperlipemia    Hypertension    Irregular heart beat    Vascular dementia [facility])    Past Surgical History:  Procedure Laterality Date   ABDOMINAL HYSTERECTOMY     CARDIAC CATHETERIZATION  2003   stent-   CARPAL TUNNEL RELEASE  03/29/2012   Procedure: CARPAL TUNNEL RELEASE;  Surgeon: Wynonia Sours, MD;  Location: [place];   Service: Orthopedics;  Laterality: Left;   EYE SURGERY     catracts   LESION EXCISION WITH COMPLEX REPAIR Right 05/25/2016   Procedure: complex repair of 25cm wound;  Surgeon: Irene Limbo, MD;  Location: [place];  Service: Plastics;  Laterality: Right;   MASTECTOMY     right   MASTECTOMY MODIFIED RADICAL Right 05/25/2016   Procedure: RIGHT MASTECTOMY MODIFIED RADICAL;  Surgeon: Fanny Skates, MD;  Location: [place];  Service: General;  Laterality: Right;   PUNCH BIOPSY OF SKIN Left 04/04/2017   Procedure: PUNCH BIOPSY OF LEFT BREAST SKIN;  Surgeon: Fanny Skates, MD;  Location: [place];  Service: General;  Laterality: Left;   RADIOACTIVE SEED GUIDED AXILLARY SENTINEL LYMPH NODE Left 04/04/2017   Procedure: RADIOACTIVE SEED TARGETED LEFT AXILLARY LYMPH NODE EXCISION;  Surgeon: Fanny Skates, MD;  Location: [place];  Service: General;  Laterality: Left;   SHOULDER ARTHROSCOPY     right and left   TRIGGER FINGER RELEASE  03/29/2012   Procedure: RELEASE TRIGGER FINGER/A-1 PULLEY;  Surgeon: Wynonia Sours, MD;  Location: [place];  Service: Orthopedics;  Laterality: Left;   Social History:  reports that she quit smoking about 44 years ago. She has never used smokeless tobacco. She reports that she does not drink alcohol and does not use drugs.  Allergies  Allergen Reactions   Lyrica [Pregabalin] Swelling    SWELLING REACTION UNSPECIFIED    Feldene [Piroxicam] Rash   Lisinopril Other (See Comments)    States that it makes her "sensitive to light" unknown   Orudis [Ketoprofen] Rash   Vibramycin [Doxycycline Calcium] Rash and Hives    Family History  Problem Relation Age of Onset   Heart disease Mother        No details     Prior to Admission medications   Medication Sig Start Date End Date Taking? Authorizing Provider  acetaminophen (TYLENOL) 500 MG tablet Take 500 mg by mouth every 4 (four) hours as needed for mild pain.     [provider]  albuterol (PROVENTIL HFA;VENTOLIN HFA) 108 (90 BASE) MCG/ACT inhaler Inhale 1 puff into the lungs every 6 (six) hours as needed for wheezing or shortness of breath. Reported on 02/04/2016    [provider]  aluminum-magnesium hydroxide-simethicone (MAALOX) 277-412-87 MG/5ML SUSP Take 30 mLs by mouth 3 (three) times daily as needed (acid).     [provider]  aspirin EC 325 MG tablet Take 325 mg by mouth every morning.     [provider]  atorvastatin (LIPITOR) 40 MG tablet TAKE 1 TABLET (40 MG TOTAL) BY MOUTH DAILY. 04/14/17 07/13/17  Minus Breeding, MD  calcium carbonate (OS-CAL) 600 MG TABS Take 600 mg by mouth 2 (two) times daily with a meal.    [provider]  diclofenac sodium (VOLTAREN) 1 % GEL Apply 1 application topically every other day. 01/08/13   [provider]  donepezil (ARICEPT) 5 MG tablet Take 5 mg by mouth at bedtime. 03/14/16   [provider]  ferrous sulfate 325 (65 FE) MG tablet Take 1 tablet (325 mg total) by mouth 2 (two) times daily with a meal. Patient taking differently: Take 325 mg by mouth daily with breakfast. 05/27/16   Fanny Skates, MD  guaifenesin (ROBITUSSIN) 100 MG/5ML syrup Take 200 mg by mouth every 6 (six) hours as needed for cough.    [provider]  hydrochlorothiazide (MICROZIDE) 12.5 MG capsule Take 12.5 mg by mouth daily. 04/17/16   [provider]  loperamide (IMODIUM) 2 MG capsule Take 2 mg by mouth as needed for diarrhea or loose stools.    [provider]  magnesium hydroxide (MILK OF MAGNESIA) 400 MG/5ML suspension Take 30 mLs by mouth at bedtime as needed for mild constipation.    [provider]  metroNIDAZOLE (FLAGYL) 500 MG tablet Take 1 tablet (500 mg total) by mouth 2 (two) times daily. 09/28/19   Valarie Merino, MD  olmesartan (BENICAR) 40 MG tablet Take 40 mg by mouth daily.    [provider]  ondansetron (ZOFRAN) 4  MG tablet Take 1 tablet (4 mg total) by mouth every 6 (six) hours. 09/28/19   Valarie Merino, MD  potassium chloride SA (KLOR-CON) 20 MEQ tablet Take 20 mEq by mouth daily.    [provider]  simvastatin (ZOCOR) 20 MG tablet Take 20 mg by mouth daily.    [provider]    Physical Exam: Vitals:   03/22/22 1345 03/22/22 1430 03/22/22 1450 03/22/22 1528  BP: (!) 130/101 (!) 138/106  (!) 148/53  Pulse: (!) 52 (!) 54  (!) 109  Resp: '12 17  20  '$ Temp:   97.8 F (36.6 C) 97.7 F (36.5 C)  TempSrc:    Oral  SpO2: 100% 100%  91%   General:  Appears calm  and comfortable and is in NAD. Thin.  Eyes:  PERRL, EOMI, normal lids, iris ENT:  grossly normal hearing, lips & tongue, dry mucous membranes; no teeth Neck:  no LAD, masses or thyromegaly; no carotid bruits Cardiovascular:  RRR, no m/r/g. No LE edema.  Respiratory:   CTA bilaterally with no wheezes/rales/rhonchi.  Normal respiratory effort. Abdomen:  soft, NT, ND, NABS Back:   normal alignment, no CVAT Skin:  no rash or induration seen on limited exam. S/p right mastectomy. Skin tenting  Musculoskeletal:  grossly normal tone BUE/BLE, good ROM, no bony abnormality Lower extremity:  No LE edema.  Limited foot exam with no ulcerations.  2+ distal pulses. Psychiatric:  grossly normal mood and affect, speech fluent. Pleasantly confused.  Neurologic:  CN 2-12 grossly intact, moves all extremities in coordinated fashion, sensation intact   Radiological Exams on Admission: Independently reviewed - see discussion in A/P where applicable  DG Lumbar Spine 2-3 Views  Result Date: 03/22/2022 CLINICAL DATA:  Trauma, fall, pain EXAM: LUMBAR SPINE - 2-3 VIEW COMPARISON:  None Available. FINDINGS: No recent fracture is seen. There is no significant disc space narrowing. There is first-degree anterolisthesis at L3-L4 level. Degenerative changes are noted in facet joints, mostly in the lower lumbar spine. Arterial calcifications are seen  in soft tissues. IMPRESSION: No recent fracture is seen in the lumbar spine. There is first-degree anterolisthesis at L3-L4 level. Lumbar spondylosis. Electronically Signed   By: Elmer Picker M.D.   On: 03/22/2022 12:18   DG Chest 1 View  Result Date: 03/22/2022 CLINICAL DATA:  Back pain, fall EXAM: CHEST  1 VIEW COMPARISON:  10/06/2019 FINDINGS: Transverse diameter of heart is increased. Thoracic aorta is tortuous. Lung fields are clear of any infiltrate or pulmonary edema. There is no pleural effusion or pneumothorax. Surgical clips are seen in chest wall. Old healed fracture is seen in the posterolateral aspect of left sixth rib. IMPRESSION: Cardiomegaly. There are no signs of pulmonary edema or focal pulmonary consolidation. Electronically Signed   By: Elmer Picker M.D.   On: 03/22/2022 12:16   DG Thoracic Spine 2 View  Result Date: 03/22/2022 CLINICAL DATA:  Seizures.  Fall with back pain. EXAM: THORACIC SPINE 2 VIEWS COMPARISON:  None Available. FINDINGS: No evidence for an acute fracture. No subluxation. Intervertebral disc spaces are preserved. Of note, T1 and T2 have not been well visualized on lateral imaging. No abnormal paraspinal line to suggest paraspinal hematoma. Bones are diffusely demineralized. IMPRESSION: 1. No acute bony abnormality. 2. Of note, T1 and T2 have not been well visualized on lateral imaging. If there is high clinical index of concern for injury at the T1-2 level, CT imaging recommended to further evaluate. Electronically Signed   By: Misty Stanley M.D.   On: 03/22/2022 12:15   CT HEAD WO CONTRAST  Result Date: 03/22/2022 CLINICAL DATA:  Neuro deficit, acute, stroke suspected; Neck trauma (Age >= 65y). Un witnessed fall. Possible seizure activity. EXAM: CT HEAD WITHOUT CONTRAST CT CERVICAL SPINE WITHOUT CONTRAST TECHNIQUE: Multidetector CT imaging of the head and cervical spine was performed following the standard protocol without intravenous contrast.  Multiplanar CT image reconstructions of the cervical spine were also generated. RADIATION DOSE REDUCTION: This exam was performed according to the departmental dose-optimization program which includes automated exposure control, adjustment of the mA and/or kV according to patient size and/or use of iterative reconstruction technique. COMPARISON:  CT head and cervical spine 10/04/2020 FINDINGS: CT HEAD FINDINGS Brain: There is no evidence of  an acute infarct, intracranial hemorrhage, mass, midline shift, or extra-axial fluid collection. Mild-to-moderate cerebral atrophy appears greatest in the parieto-occipital regions. Hypodensities in the cerebral white matter bilaterally are unchanged and nonspecific but compatible with moderate chronic small vessel ischemic disease. Lacunar infarcts in the thalami are more conspicuous today but were likely present on the prior study and are therefore favored to be chronic. Vascular: Calcified atherosclerosis at the skull base. No hyperdense vessel. Skull: No fracture or suspicious osseous lesion. Sinuses/Orbits: Visualized paranasal sinuses and mastoid air cells are clear. Bilateral cataract extraction. Other: None. CT CERVICAL SPINE FINDINGS Alignment: Normal. Skull base and vertebrae: No acute fracture or suspicious osseous lesion. Soft tissues and spinal canal: No prevertebral fluid or swelling. No visible canal hematoma. Disc levels: Similar appearance of multilevel disc degeneration, greatest at C5-6 where there is likely mild spinal stenosis and mild left neural foraminal stenosis. Moderate multilevel facet arthrosis. Upper chest: Mild centrilobular emphysema in the lung apices. IMPRESSION: 1. No evidence of acute intracranial abnormality. 2. Moderate chronic small vessel ischemic disease. 3. No acute cervical spine fracture. Electronically Signed   By: Logan Bores M.D.   On: 03/22/2022 12:04   CT CERVICAL SPINE WO CONTRAST  Result Date: 03/22/2022 CLINICAL DATA:  Neuro  deficit, acute, stroke suspected; Neck trauma (Age >= 65y). Un witnessed fall. Possible seizure activity. EXAM: CT HEAD WITHOUT CONTRAST CT CERVICAL SPINE WITHOUT CONTRAST TECHNIQUE: Multidetector CT imaging of the head and cervical spine was performed following the standard protocol without intravenous contrast. Multiplanar CT image reconstructions of the cervical spine were also generated. RADIATION DOSE REDUCTION: This exam was performed according to the departmental dose-optimization program which includes automated exposure control, adjustment of the mA and/or kV according to patient size and/or use of iterative reconstruction technique. COMPARISON:  CT head and cervical spine 10/04/2020 FINDINGS: CT HEAD FINDINGS Brain: There is no evidence of an acute infarct, intracranial hemorrhage, mass, midline shift, or extra-axial fluid collection. Mild-to-moderate cerebral atrophy appears greatest in the parieto-occipital regions. Hypodensities in the cerebral white matter bilaterally are unchanged and nonspecific but compatible with moderate chronic small vessel ischemic disease. Lacunar infarcts in the thalami are more conspicuous today but were likely present on the prior study and are therefore favored to be chronic. Vascular: Calcified atherosclerosis at the skull base. No hyperdense vessel. Skull: No fracture or suspicious osseous lesion. Sinuses/Orbits: Visualized paranasal sinuses and mastoid air cells are clear. Bilateral cataract extraction. Other: None. CT CERVICAL SPINE FINDINGS Alignment: Normal. Skull base and vertebrae: No acute fracture or suspicious osseous lesion. Soft tissues and spinal canal: No prevertebral fluid or swelling. No visible canal hematoma. Disc levels: Similar appearance of multilevel disc degeneration, greatest at C5-6 where there is likely mild spinal stenosis and mild left neural foraminal stenosis. Moderate multilevel facet arthrosis. Upper chest: Mild centrilobular emphysema in  the lung apices. IMPRESSION: 1. No evidence of acute intracranial abnormality. 2. Moderate chronic small vessel ischemic disease. 3. No acute cervical spine fracture. Electronically Signed   By: Logan Bores M.D.   On: 03/22/2022 12:04    EKG: Independently reviewed.  NSR with rate 57; nonspecific ST changes with no evidence of acute ischemia. Incomplete lBBB   Labs on Admission: I have personally reviewed the available labs and imaging studies at the time of the admission.  Pertinent labs:    hgb: 10.2,  BUN: 26,  creatinine: 1.67,   Assessment and Plan: Principal Problem:   Syncope and collapse with seizure like activity and bradycardia Active  Problems:   Acute renal failure superimposed on stage 3b chronic kidney disease (HCC)   Essential hypertension   Dyslipidemia   Coronary artery disease involving native coronary artery of native heart without angina pectoris   Type 2 diabetes mellitus with complication, without long-term current use of insulin (HCC)   Vascular dementia (HCC)   Seizure-like activity (HCC)   Normocytic anemia    Assessment and Plan: * Syncope and collapse with seizure like activity and bradycardia 88 year old presenting with unwitnessed fall and seizure like activity with heart rate of 46 -obs to progressive -received atropine with EMS, heart rate has been in 70s -telemetry  -frequent neuro checks  -cardiogenic vs. orthostasis vs. Seizure -CT head with no acute finding -initial troponin wnl  -echo pending -blood pressures soft, checking orthostatics, holding anti hypertensive meds, TED hose -routine EEG -check TSH, mag, CK, B12 f/u on UA -seizure and fall precautions  -PT/OT -gentle IVF    Acute renal failure superimposed on stage 3b chronic kidney disease ([place]) Only have records from 2022 and creatinine was around 1.2.  Unsure if worsening renal function vs. AKI Check UA Strict I/O Hold nephrotoxic drugs Gentle IVF Trend   Essential  hypertension Blood pressure soft in setting of syncope Check orthostatics Hold oral anti-hypertensives and follow   Normocytic anemia At baseline Continue to monitor  Checking b12  Vascular dementia ([place]) Continue aricept (mar not done to verify)  Delirium precautions   Type 2 diabetes mellitus with complication, without long-term current use of insulin (HCC) No recent A1C since 2017 Repeat A1C today Appears to be diet controlled SSI and accuchecks Qac/hs   Coronary artery disease involving native coronary artery of native heart without angina pectoris Syncope in setting of bradycardia Troponin wnl x 1 Echo pending Continue medical management with ASA/statin   Dyslipidemia Continue statin    Advance Care Planning:   Code Status: Full Code    Consults: PT/OT   DVT Prophylaxis: lovenox   Family Communication: updated daughter by phone: Ernie Avena: [phone]  Severity of Illness: The appropriate patient status for this patient is OBSERVATION. Observation status is judged to be reasonable and necessary in order to provide the required intensity of service to ensure the patient's safety. The patient's presenting symptoms, physical exam findings, and initial radiographic and laboratory data in the context of their medical condition is felt to place them at decreased risk for further clinical deterioration. Furthermore, it is anticipated that the patient will be medically stable for discharge from the hospital within 2 midnights of admission.   Author: Orma Flaming, MD 03/22/2022 5:55 PM  For on call review [email or web address].

## 2022-03-22 NOTE — ED Triage Notes (Signed)
Pt BIB EMS due to unwitnessed fall, staff states "her eyes were rolling in the back of her had" and states it was seizure activity. Pt brady at 45, has shob, '1mg'$  of atropine via ems, hr now 60.  Pt baseline is axo to self. Pt in c-collar. Denies blood thinners/ LOC.

## 2022-03-22 NOTE — Progress Notes (Signed)
EEG complete - results pending 

## 2022-03-22 NOTE — Assessment & Plan Note (Signed)
No recent A1C since 2017 Repeat A1C today Appears to be diet controlled SSI and accuchecks Qac/hs

## 2022-03-22 NOTE — ED Notes (Signed)
Pt pulled out IV, pt very confused and will not wear cardiac monitor. RN asked sec to order mittens. RN left door open for fall risk/ safety precautions.

## 2022-03-22 NOTE — ED Notes (Signed)
PATIENT DRESS IN A GOWN AND ON MONITOR

## 2022-03-22 NOTE — Assessment & Plan Note (Signed)
Blood pressure soft in setting of syncope Check orthostatics Hold oral anti-hypertensives and follow

## 2022-03-23 ENCOUNTER — Observation Stay (HOSPITAL_COMMUNITY): Payer: Medicare Other

## 2022-03-23 DIAGNOSIS — R55 Syncope and collapse: Secondary | ICD-10-CM | POA: Diagnosis present

## 2022-03-23 DIAGNOSIS — I10 Essential (primary) hypertension: Secondary | ICD-10-CM | POA: Diagnosis not present

## 2022-03-23 DIAGNOSIS — N184 Chronic kidney disease, stage 4 (severe): Secondary | ICD-10-CM | POA: Diagnosis not present

## 2022-03-23 DIAGNOSIS — Z87891 Personal history of nicotine dependence: Secondary | ICD-10-CM | POA: Diagnosis not present

## 2022-03-23 DIAGNOSIS — G9341 Metabolic encephalopathy: Secondary | ICD-10-CM | POA: Diagnosis present

## 2022-03-23 DIAGNOSIS — Z8249 Family history of ischemic heart disease and other diseases of the circulatory system: Secondary | ICD-10-CM | POA: Diagnosis not present

## 2022-03-23 DIAGNOSIS — E785 Hyperlipidemia, unspecified: Secondary | ICD-10-CM | POA: Diagnosis present

## 2022-03-23 DIAGNOSIS — R001 Bradycardia, unspecified: Secondary | ICD-10-CM | POA: Diagnosis present

## 2022-03-23 DIAGNOSIS — I130 Hypertensive heart and chronic kidney disease with heart failure and stage 1 through stage 4 chronic kidney disease, or unspecified chronic kidney disease: Secondary | ICD-10-CM | POA: Diagnosis present

## 2022-03-23 DIAGNOSIS — Y92129 Unspecified place in nursing home as the place of occurrence of the external cause: Secondary | ICD-10-CM | POA: Diagnosis not present

## 2022-03-23 DIAGNOSIS — F0150 Vascular dementia without behavioral disturbance: Secondary | ICD-10-CM | POA: Diagnosis present

## 2022-03-23 DIAGNOSIS — F05 Delirium due to known physiological condition: Secondary | ICD-10-CM | POA: Diagnosis present

## 2022-03-23 DIAGNOSIS — D649 Anemia, unspecified: Secondary | ICD-10-CM

## 2022-03-23 DIAGNOSIS — R569 Unspecified convulsions: Secondary | ICD-10-CM | POA: Diagnosis present

## 2022-03-23 DIAGNOSIS — Z9011 Acquired absence of right breast and nipple: Secondary | ICD-10-CM | POA: Diagnosis not present

## 2022-03-23 DIAGNOSIS — Z888 Allergy status to other drugs, medicaments and biological substances status: Secondary | ICD-10-CM | POA: Diagnosis not present

## 2022-03-23 DIAGNOSIS — N1832 Chronic kidney disease, stage 3b: Secondary | ICD-10-CM

## 2022-03-23 DIAGNOSIS — I2510 Atherosclerotic heart disease of native coronary artery without angina pectoris: Secondary | ICD-10-CM

## 2022-03-23 DIAGNOSIS — I5032 Chronic diastolic (congestive) heart failure: Secondary | ICD-10-CM | POA: Diagnosis present

## 2022-03-23 DIAGNOSIS — G9349 Other encephalopathy: Secondary | ICD-10-CM | POA: Diagnosis present

## 2022-03-23 DIAGNOSIS — Z7982 Long term (current) use of aspirin: Secondary | ICD-10-CM | POA: Diagnosis not present

## 2022-03-23 DIAGNOSIS — E861 Hypovolemia: Secondary | ICD-10-CM | POA: Diagnosis present

## 2022-03-23 DIAGNOSIS — E1122 Type 2 diabetes mellitus with diabetic chronic kidney disease: Secondary | ICD-10-CM | POA: Diagnosis present

## 2022-03-23 DIAGNOSIS — N179 Acute kidney failure, unspecified: Secondary | ICD-10-CM | POA: Diagnosis present

## 2022-03-23 DIAGNOSIS — W19XXXA Unspecified fall, initial encounter: Secondary | ICD-10-CM | POA: Diagnosis present

## 2022-03-23 DIAGNOSIS — Z853 Personal history of malignant neoplasm of breast: Secondary | ICD-10-CM | POA: Diagnosis not present

## 2022-03-23 LAB — ECHOCARDIOGRAM COMPLETE
AR max vel: 1.22 cm2
AV Area VTI: 1.1 cm2
AV Area mean vel: 1.1 cm2
AV Mean grad: 19.5 mmHg
AV Peak grad: 34.3 mmHg
Ao pk vel: 2.93 m/s
Area-P 1/2: 3.87 cm2
P 1/2 time: 469 msec
S' Lateral: 2 cm

## 2022-03-23 LAB — CBC
HCT: 30.5 % — ABNORMAL LOW (ref 36.0–46.0)
Hemoglobin: 9.6 g/dL — ABNORMAL LOW (ref 12.0–15.0)
MCH: 28.2 pg (ref 26.0–34.0)
MCHC: 31.5 g/dL (ref 30.0–36.0)
MCV: 89.4 fL (ref 80.0–100.0)
Platelets: 230 10*3/uL (ref 150–400)
RBC: 3.41 MIL/uL — ABNORMAL LOW (ref 3.87–5.11)
RDW: 12 % (ref 11.5–15.5)
WBC: 6.1 10*3/uL (ref 4.0–10.5)
nRBC: 0 % (ref 0.0–0.2)

## 2022-03-23 LAB — BASIC METABOLIC PANEL
Anion gap: 7 (ref 5–15)
BUN: 26 mg/dL — ABNORMAL HIGH (ref 8–23)
CO2: 21 mmol/L — ABNORMAL LOW (ref 22–32)
Calcium: 8.8 mg/dL — ABNORMAL LOW (ref 8.9–10.3)
Chloride: 109 mmol/L (ref 98–111)
Creatinine, Ser: 1.53 mg/dL — ABNORMAL HIGH (ref 0.44–1.00)
GFR, Estimated: 33 mL/min — ABNORMAL LOW (ref >60)
Glucose, Bld: 90 mg/dL (ref 70–99)
Potassium: 4.3 mmol/L (ref 3.5–5.1)
Sodium: 137 mmol/L (ref 135–145)

## 2022-03-23 LAB — GLUCOSE, CAPILLARY
Glucose-Capillary: 84 mg/dL (ref 70–99)
Glucose-Capillary: 85 mg/dL (ref 70–99)
Glucose-Capillary: 91 mg/dL (ref 70–99)

## 2022-03-23 MED ORDER — SODIUM CHLORIDE 0.9 % IV SOLN
100.0000 mL/h | INTRAVENOUS | Status: AC
  Administered 2022-03-23 – 2022-03-24 (×3): 100 mL/h via INTRAVENOUS

## 2022-03-23 NOTE — Progress Notes (Signed)
Patient currently agitated.  Georgiann Mohs, MD notified.  No orthostatics able to be obtained at this time.

## 2022-03-23 NOTE — Evaluation (Signed)
Occupational Therapy Evaluation Patient Details Name: Jennifer Garrett MRN: [id number] DOB: January 02, 1932 Today's Date: 03/23/2022   History of Present Illness Pt is an 88 y.o. female presenting to [facility] ED 03/22/2022 following unwitnessed fall at her SNF and was observed by staff at SNF to be unresponsive with "eyes rolling back" with bradycardic episode to 46bpm. CT scans revealing moderate chrinoc small vessel ischemic disease and EEG revealing mild to moderate diffuse encephalopathy.  PMH significant for HTN, arthritis, breast cancer, irregular heart beat, CAD, disbetes and vascular dememtia.   Clinical Impression   Pt admitted with problems above and deficits listed below. Pt unable to provide history due to confusion. Pt presenting with decreased problem solving, command following, orientation, memory, safety, balance, activity tolerance, and strength. Pt following <10% of direct simple commands this session, and with frequent tangential thoughts. Pt requiring max A+2 for all functional transfers due to significant posterior lean and decreased ability to follow commands. Pt performing ADL with max A at this time due to poor sequencing and problem solving. Suspect decreased peripheral vision as well consistent with diagnosis and pt benefiting from therapist moving within visual field to initiate commands and cues. Recommend discharge at SNF for continued OT  to optimize safety and independence in ADL.    Recommendations for follow up therapy are one component of a multi-disciplinary discharge planning process, led by the attending physician.  Recommendations may be updated based on patient status, additional functional criteria and insurance authorization.   Follow Up Recommendations  Skilled nursing-short term rehab (<3 hours/day)    Assistance Recommended at Discharge Frequent or constant Supervision/Assistance  Patient can return home with the following Two people to help with walking and/or transfers;A  lot of help with bathing/dressing/bathroom;Assistance with cooking/housework;Direct supervision/assist for medications management;Direct supervision/assist for financial management;Assist for transportation;Help with stairs or ramp for entrance;Assistance with feeding    Functional Status Assessment  Patient has had a recent decline in their functional status and/or demonstrates limited ability to make significant improvements in function in a reasonable and predictable amount of time  Equipment Recommendations  Other (comment) (defer to next venue)    Recommendations for Other Services       Precautions / Restrictions Precautions Precautions: Fall Restrictions Weight Bearing Restrictions: No      Mobility Bed Mobility               General bed mobility comments: EOB with nurse tech on arrival.    Transfers Overall transfer level: Needs assistance Equipment used: 2 person hand held assist Transfers: Bed to chair/wheelchair/BSC, Sit to/from Stand Sit to Stand: Max assist, +2 safety/equipment, +2 physical assistance Stand pivot transfers: Max assist, +2 physical assistance, +2 safety/equipment         General transfer comment: Pt with hevy posterior lean/retropulsion in standing and requiring max A+2 for stadning balance with support at trunk and pelvis. Max A and max multimodal cues for all transfers. With decreased safety awareness.      Balance Overall balance assessment: Needs assistance Sitting-balance support: Bilateral upper extremity supported, No upper extremity supported, Feet supported Sitting balance-Leahy Scale: Fair Sitting balance - Comments: posterior lean, occasionally maintaining balance without back support with min guard A. able to achieve figure 4 position on BSC while leaning on arm of BSC.   Standing balance support: Bilateral upper extremity supported, During functional activity Standing balance-Leahy Scale: Zero Standing balance comment: max  A+2  ADL either performed or assessed with clinical judgement   ADL Overall ADL's : Needs assistance/impaired     Grooming: Wash/dry face;Oral care;Maximal assistance;Sitting Grooming Details (indicate cue type and reason): hand over hand, not following commands, or sustaining after demonstration or hand over hand Upper Body Bathing: Maximal assistance;Sitting   Lower Body Bathing: Maximal assistance;Sitting/lateral leans   Upper Body Dressing : Sitting;Maximal assistance Upper Body Dressing Details (indicate cue type and reason): pushing arm into sleeve of gown. Lower Body Dressing: Maximal assistance;Sit to/from stand;Sitting/lateral leans Lower Body Dressing Details (indicate cue type and reason): Max A due to not following commands. Pt able to perform figure 4 position, but unable to sequence or follow commands. Toilet Transfer: Maximal assistance;+2 for physical assistance;+2 for safety/equipment;BSC/3in1;Stand-pivot Toilet Transfer Details (indicate cue type and reason): Max A 2 person hand held assist. max multimodal cues for safety and problem solving. Toileting- Clothing Manipulation and Hygiene: Total assistance;+2 for physical assistance;+2 for safety/equipment;Sit to/from stand;Cueing for safety Toileting - Clothing Manipulation Details (indicate cue type and reason): Pt with max posterior lean requiring max A and multimodal cues to maintain standing. Nurse tech in room and providing posterior pericare     Functional mobility during ADLs: Maximal assistance;+2 for physical assistance;+2 for safety/equipment General ADL Comments: Pt not sustaining grasp on RW and reporting she does not need. Poor understaning of use and leaning backward in standing with holding onto RW tipping it backward. Recommend 2 person hand held assist or face to face transfers in future session.     Vision   Vision Assessment?: Vision impaired- to be further tested  in functional context Additional Comments: Pt unable to report or participate in visual assessment, but suspect peripheral vision loss consistent with prior dx of vascular dementia (maybe baseline) based on difficulty making eye contact unless directly in front of pt, and multimodal cues to have patient locate pillow next to her on bed before lying down. Pt with increased fear of movement and falling.     Perception     Praxis      Pertinent Vitals/Pain Pain Assessment Pain Assessment: Faces Faces Pain Scale: Hurts a little bit Pain Location: generalized discomfort Pain Descriptors / Indicators: Discomfort Pain Intervention(s): Limited activity within patient's tolerance, Monitored during session, Repositioned     Hand Dominance     Extremity/Trunk Assessment Upper Extremity Assessment Upper Extremity Assessment: Generalized weakness;Difficult to assess due to impaired cognition   Lower Extremity Assessment Lower Extremity Assessment: Defer to PT evaluation   Cervical / Trunk Assessment Cervical / Trunk Assessment:  (slumped posture)   Communication Communication Communication: Other (comment) (Pt appears to be a poor historian; with tangential thought processes throughout, and not following commands.)   Cognition Arousal/Alertness: Awake/alert Behavior During Therapy: Restless Overall Cognitive Status: No family/caregiver present to determine baseline cognitive functioning                                 General Comments: Pt following <10% of direct simple commands this session. Pt reqiuring step by step commands and multimodal cues for sequencing of all ADL and transfers. Pt pulling up socks 1-2 times during session, but unable to follow commands to pull up/don socks, wash face, etc. Provided a wash cloth, and hand over hand A, pt washing face, but not sustaining after therapist ceased hand over hand. Pt not oriented to place, time, or situation. Frequently  speaking to people who are not present,  and max difficulty understanding objectives (washing up, transfers, etc). Very confussed; touching stool with hands, etc.     General Comments  VSS, pt very confused.    Exercises     Shoulder Instructions      Home Living Family/patient expects to be discharged to:: Skilled nursing facility                                 Additional Comments: Per chart, pt is [facility] from [facility]. Pt unable to provide history.      Prior Functioning/Environment Prior Level of Function : Patient poor historian/Family not available               ADLs Comments: Pt presetning following a fall. Pt not following commands during session, speaking to people who are not pressent, and likely poor historian.        OT Problem List: Decreased strength;Decreased activity tolerance;Impaired balance (sitting and/or standing);Impaired vision/perception;Decreased cognition;Decreased safety awareness;Decreased knowledge of use of DME or AE      OT Treatment/Interventions: Self-care/ADL training;Therapeutic exercise;DME and/or AE instruction;Therapeutic activities;Cognitive remediation/compensation;Balance training;Patient/family education;Visual/perceptual remediation/compensation    OT Goals(Current goals can be found in the care plan section) Acute Rehab OT Goals Patient Stated Goal: unable to state OT Goal Formulation: Patient unable to participate in goal setting Time For Goal Achievement: 04/06/22 Potential to Achieve Goals: Fair  OT Frequency: Min 2X/week    Co-evaluation              AM-PAC OT "6 Clicks" Daily Activity     Outcome Measure Help from another person eating meals?: A Lot Help from another person taking care of personal grooming?: A Lot Help from another person toileting, which includes using toliet, bedpan, or urinal?: A Lot Help from another person bathing (including washing, rinsing, drying)?: A Lot Help from  another person to put on and taking off regular upper body clothing?: A Lot Help from another person to put on and taking off regular lower body clothing?: A Lot 6 Click Score: 12   End of Session Equipment Utilized During Treatment: Rolling walker (2 wheels) Nurse Communication: Mobility status  Activity Tolerance: Treatment limited secondary to agitation;Patient tolerated treatment well (intermittent agitation) Patient left: in bed;with call bell/phone within reach;with bed alarm set;with nursing/sitter in room  OT Visit Diagnosis: Unsteadiness on feet (R26.81);Muscle weakness (generalized) (M62.81);Other symptoms and signs involving cognitive function;Cognitive communication deficit (R41.841);Low vision, both eyes (H54.2) Symptoms and signs involving cognitive functions: Other cerebrovascular disease                Time: 0937-1011 OT Time Calculation (min): 34 min Charges:  OT General Charges $OT Visit: 1 Visit OT Evaluation $OT Eval Moderate Complexity: 1 Mod OT Treatments $Self Care/Home Management : 8-22 mins  Shanda Howells, OTR/L [facility] Acute Rehabilitation Office: [phone]   Lula Olszewski 03/23/2022, 11:22 AM

## 2022-03-23 NOTE — Progress Notes (Signed)
Pt agitated throughout shift.  Pt hitting staff when staff changing patient.  Pt not redirectable.  Able to give PO lorazepam with no effect.  Order placed for tele sitter due to patient being caught sideways in bed numerous times despite bed alarm.  This RN having to sit 1:1 with patient until tele sitter available.  Charge RN and Dr. Marlowe Sax aware of situation.  No further orders given by Dr. Marlowe Sax not has provider come to evaluate patient.  Will continue to monitor.

## 2022-03-23 NOTE — Progress Notes (Addendum)
[facility]  PROGRESS NOTE  Jennifer Garrett AOZ:[id number] DOB: 1932/05/06 DOA: 03/22/2022 PCP: Nolene Ebbs, MD   Brief HPI:   88 year old female with history of breast cancer status postmastectomy, CAD, diabetes mellitus type 2, hyperlipidemia, vascular dementia presented with unwitnessed fall at skilled nursing facility and seizure-like activity.  When EMS arrived she was bradycardic with heart rate 46 bpm, EMS gave her atropine.    Subjective   Patient sitting in chair comfortably, denies any complaints.  Pleasantly confused.   Assessment/Plan:   Syncope and collapse with seizure-like activity/bradycardia -Patient had an unwitnessed fall with seizure-like activity, heart rate was 46 on admission -Received atropine per EMS -EEG showed generalized continuous slowing suggestive of mild to moderate diffuse encephalopathy; no seizures or epileptiform discharges seen throughout the recording -Echocardiogram showed EF of 60 to 65% with grade 1 diastolic dysfunction mildly elevated pulmonary artery pressure, normal right ventricular size -Patient is on olmesartan and HCTZ at home, but his medications are currently on hold -We will check orthostatic vital signs every 4 hours x3  Acute kidney injury on CKD stage IIIb -Patient baseline creatinine was 1.2 as of 2022 -Presented with creatinine of 1.70; improved to 1.53 today -Was started on gentle IV fluids; continue IV normal saline at 100 mL/h -Follow renal function in a.m.  Hypertension -Patient blood pressure has been soft in setting of syncope -Orthostatic vital signs ordered as above -Antihypertensive medications are currently on hold  Normocytic anemia -B12 level 262 -Hemoglobin at baseline  Vascular dementia -Continue Aricept  Diabetes mellitus type 2 -Hemoglobin A1c is 5.9 -Continue sliding scale insulin with NovoLog -CBG is well controlled  Coronary artery disease involving native coronary artery of native heart  without angina pectoris Syncope in setting of bradycardia Troponin wnl x 1 Echo shows no wall motion abnormality Continue medical management with ASA/statin    Dyslipidemia Continue statin     Medications     enoxaparin (LOVENOX) injection  30 mg Subcutaneous Q24H   insulin aspart  0-9 Units Subcutaneous TID WC   sodium chloride flush  3 mL Intravenous Q12H     Data Reviewed:   CBG:  Recent Labs  Lab 03/22/22 1618 03/23/22 0538 03/23/22 1157 03/23/22 1620  GLUCAP 91 84 85 91    SpO2: 100 %    Vitals:   03/23/22 1500 03/23/22 1551 03/23/22 1600 03/23/22 1700  BP:  (!) 105/50    Pulse:  (!) 57    Resp: '19 17 18 19  '$ Temp:  99.3 F (37.4 C)    TempSrc:  Axillary    SpO2:  100%        Data Reviewed:  Basic Metabolic Panel: Recent Labs  Lab 03/22/22 1116 03/22/22 1142 03/22/22 1533 03/23/22 0425  NA 138 136  --  137  K 5.1 4.9  --  4.3  CL 106 109  --  109  CO2 22  --   --  21*  GLUCOSE 120* 112*  --  90  BUN 26* 26*  --  26*  CREATININE 1.67* 1.70*  --  1.53*  CALCIUM 9.4  --   --  8.8*  MG  --   --  2.0  --     CBC: Recent Labs  Lab 03/22/22 1116 03/22/22 1142 03/23/22 0425  WBC 5.8  --  6.1  NEUTROABS 3.9  --   --   HGB 10.2* 10.9* 9.6*  HCT 33.2* 32.0* 30.5*  MCV 91.7  --  89.4  PLT  235  --  230    LFT Recent Labs  Lab 03/22/22 1116  AST 19  ALT 8  ALKPHOS 69  BILITOT 0.4  PROT 6.5  ALBUMIN 3.2*     Antibiotics: Anti-infectives (From admission, onward)    None        DVT prophylaxis: Lovenox  Code Status: Full code  Family Communication: No family at bedside   CONSULTS    Objective    Physical Examination:     Status is: Inpatient:    General-appears in no acute distress Heart-S1-S2, regular, no murmur auscultated Lungs-clear to auscultation bilaterally, no wheezing or crackles auscultated Abdomen-soft, nontender, no organomegaly Extremities-no edema in the lower extremities Neuro-alert,  oriented to self only         [place]   [facility] If 7PM-7AM, please contact night-coverage at [email or web address], Office  [phone]   03/23/2022, 5:25 PM  LOS: 0 days

## 2022-03-23 NOTE — Procedures (Signed)
Patient Name: THEODORA LALANNE  MRN: [id number]  Epilepsy Attending: Lora Havens  Referring Physician/Provider: Orma Flaming, MD Date: 03/22/2022 Duration: 21.14 mins  Patient history: 88 yo F with syncope and seizure like activity. EEG to evaluate for seizure  Level of alertness: Awake  AEDs during EEG study: None  Technical aspects: This EEG study was done with scalp electrodes positioned according to the 10-20 International system of electrode placement. Electrical activity was reviewed with band pass filter of 1-'70Hz'$ , sensitivity of 7 uV/mm, display speed of 19m/sec with a '60Hz'$  notched filter applied as appropriate. EEG data were recorded continuously and digitally stored.  Video monitoring was available and reviewed as appropriate.  Description: The posterior dominant rhythm consists of 7 Hz activity of moderate voltage (25-35 uV) seen predominantly in posterior head regions, symmetric and reactive to eye opening and eye closing. EEG showed continuous generalized 5 to 6 Hz theta slowing. Hyperventilation and photic stimulation were not performed.     ABNORMALITY - Continuous slow, generalized - Background slow  IMPRESSION: This study is suggestive of mild to moderate diffuse encephalopathy, nonspecific etiology. No seizures or epileptiform discharges were seen throughout the recording.  Aine Strycharz OBarbra Sarks

## 2022-03-23 NOTE — Progress Notes (Signed)
CSW attempted to contact daughter to complete assessment, left a voicemail asking for call back. CSW to follow.  Laveda Abbe, [place] Clinical Social Worker [phone]

## 2022-03-23 NOTE — Evaluation (Signed)
Physical Therapy Evaluation Patient Details Name: Jennifer Garrett MRN: [id number] DOB: Jul 30, 1931 Today's Date: 03/23/2022  History of Present Illness  Pt is an 88 y.o. female presenting to [facility] ED 03/22/2022 following unwitnessed fall at her SNF and was observed by staff at SNF to be unresponsive with "eyes rolling back" with bradycardic episode to 46bpm. CT scans revealing moderate chrinoc small vessel ischemic disease and EEG revealing mild to moderate diffuse encephalopathy.  PMH significant for HTN, arthritis, breast cancer, irregular heart beat, CAD, disbetes and vascular dememtia.   Clinical Impression  Pt admitted with above. Pt very confused with noted hallucinations, decreased insight to deficits, only oriented to self, decreased awareness of surroundings ie. Stool incontinence, and inability to follow commands or demonstrated problem solving skills. Pt with significant resistance with retropulsion to all mobility requiring maxA for transfer to [facility]. Pt with bilat mittens on as well. Recommend SNF upon d/c for maximal functional recovery.     Recommendations for follow up therapy are one component of a multi-disciplinary discharge planning process, led by the attending physician.  Recommendations may be updated based on patient status, additional functional criteria and insurance authorization.  Follow Up Recommendations Skilled nursing-short term rehab (<3 hours/day) Can patient physically be transported by private vehicle: Yes    Assistance Recommended at Discharge Frequent or constant Supervision/Assistance  Patient can return home with the following  A lot of help with walking and/or transfers;A lot of help with bathing/dressing/bathroom;Assistance with cooking/housework;Direct supervision/assist for medications management;Direct supervision/assist for financial management;Assist for transportation;Help with stairs or ramp for entrance    Equipment Recommendations Other (comment) (TBD at  next venue)  Recommendations for Other Services       Functional Status Assessment Patient has had a recent decline in their functional status and demonstrates the ability to make significant improvements in function in a reasonable and predictable amount of time.     Precautions / Restrictions Precautions Precautions: Fall Restrictions Weight Bearing Restrictions: No      Mobility  Bed Mobility Overal bed mobility: Needs Assistance Bed Mobility: Supine to Sit     Supine to sit: Max assist     General bed mobility comments: RN tech trying to assist pt to EOB upon PT arrival, pt requiring max verbal and tactile cues, pt resisting even though she is asking to go to the bathroom. Pt then will BM on the bed and floor    Transfers Overall transfer level: Needs assistance Equipment used: 2 person hand held assist Transfers: Bed to chair/wheelchair/BSC, Sit to/from Stand Sit to Stand: Max assist, +2 safety/equipment, +2 physical assistance Stand pivot transfers: Max assist, +2 physical assistance, +2 safety/equipment Step pivot transfers: Max assist, +2 physical assistance       General transfer comment: Pt with heavy posterior lean/retropulsion in standing and requiring max A+2 for standing balance with support at trunk and pelvis. Max A and max multimodal cues for all transfers. With decreased safety awareness.    Ambulation/Gait               General Gait Details: pt with strong posterior bias and resisting tactile cues and assist from PT/OT  Stairs            Wheelchair Mobility    Modified Rankin (Stroke Patients Only)       Balance Overall balance assessment: Needs assistance Sitting-balance support: Bilateral upper extremity supported, No upper extremity supported, Feet supported Sitting balance-Leahy Scale: Fair Sitting balance - Comments: posterior lean, occasionally maintaining balance  without back support with min guard A. able to achieve figure  4 position on BSC while leaning on arm of BSC.   Standing balance support: Bilateral upper extremity supported, During functional activity Standing balance-Leahy Scale: Zero Standing balance comment: max A+2                             Pertinent Vitals/Pain Pain Assessment Pain Assessment: Faces Faces Pain Scale: Hurts a little bit Pain Location: generalized discomfort Pain Descriptors / Indicators: Discomfort Pain Intervention(s): Monitored during session    Home Living Family/patient expects to be discharged to:: Skilled nursing facility                   Additional Comments: Per chart, pt is presenting from [facility]. Pt unable to provide history.    Prior Function Prior Level of Function : Patient poor historian/Family not available (unsure if pt was ambulatory PTA or how she transfered)               ADLs Comments: Pt presetning following a fall. Pt not following commands during session, speaking to people who are not pressent, and likely poor historian.     Hand Dominance   Dominant Hand: Right    Extremity/Trunk Assessment   Upper Extremity Assessment Upper Extremity Assessment: Generalized weakness    Lower Extremity Assessment Lower Extremity Assessment: Generalized weakness    Cervical / Trunk Assessment Cervical / Trunk Assessment: Kyphotic (slumped posture)  Communication   Communication: Other (comment) (confused, tangential, not oriented)  Cognition Arousal/Alertness: Awake/alert Behavior During Therapy: Restless Overall Cognitive Status: No family/caregiver present to determine baseline cognitive functioning                                 General Comments: pt confused, hallucinating, seeing people and objects that aren't there, thinking objects are something they're not, pt unaware of deficits, surroundings, and only oriented to self. Pt running hands through stool without awareness, pt stating she has to go  to the bathroom but not recognizing PT/OT assisting her to the bathroom or the ability to use the bathroom. Pt with <10% command follow, easily distracted and required max verbal and tactile cues to complete tasks.        General Comments General comments (skin integrity, edema, etc.): pt unable to assist wtih pericare s/p BM, VSS, dependent for hygiene    Exercises     Assessment/Plan    PT Assessment Patient needs continued PT services  PT Problem List Decreased strength;Decreased activity tolerance;Decreased balance;Decreased mobility;Decreased cognition;Decreased safety awareness       PT Treatment Interventions DME instruction;Gait training;Stair training;Functional mobility training;Therapeutic activities;Therapeutic exercise;Balance training;Neuromuscular re-education    PT Goals (Current goals can be found in the Care Plan section)  Acute Rehab PT Goals Patient Stated Goal: didn't state PT Goal Formulation: Patient unable to participate in goal setting Time For Goal Achievement: 04/06/22 Potential to Achieve Goals: Good    Frequency Min 2X/week     Co-evaluation PT/OT/SLP Co-Evaluation/Treatment: Yes Reason for Co-Treatment: Necessary to address cognition/behavior during functional activity PT goals addressed during session: Mobility/safety with mobility         AM-PAC PT "6 Clicks" Mobility  Outcome Measure Help needed turning from your back to your side while in a flat bed without using bedrails?: A Lot Help needed moving from lying on your back to sitting  on the side of a flat bed without using bedrails?: A Lot Help needed moving to and from a bed to a chair (including a wheelchair)?: A Lot Help needed standing up from a chair using your arms (e.g., wheelchair or bedside chair)?: A Lot Help needed to walk in hospital room?: Total Help needed climbing 3-5 steps with a railing? : Total 6 Click Score: 10    End of Session Equipment Utilized During Treatment:  Gait belt Activity Tolerance: Treatment limited secondary to agitation Patient left: in chair;with call bell/phone within reach;with chair alarm set;with nursing/sitter in room Nurse Communication: Mobility status PT Visit Diagnosis: Unsteadiness on feet (R26.81);Muscle weakness (generalized) (M62.81);Difficulty in walking, not elsewhere classified (R26.2)    Time: 9147-8295 PT Time Calculation (min) (ACUTE ONLY): 21 min   Charges:   PT Evaluation $PT Eval Moderate Complexity: 1 Mod          Kittie Plater, PT, DPT Acute Rehabilitation Services Secure chat preferred Office #: [phone]   Berline Lopes 03/23/2022, 2:24 PM

## 2022-03-23 NOTE — Progress Notes (Signed)
Pt had episode where she started flailing and asking to be held.  Heart rate went up to 140s, but this RN went to hold patient's hand and her HR came down and she continued to talk to this RN throughout episode.  Dr. Marlowe Sax made aware of this.  Pt now calmer.  Pt repetitively talking to people that are not present and carrying on conversations with herself.

## 2022-03-24 DIAGNOSIS — N184 Chronic kidney disease, stage 4 (severe): Secondary | ICD-10-CM | POA: Diagnosis not present

## 2022-03-24 DIAGNOSIS — E118 Type 2 diabetes mellitus with unspecified complications: Secondary | ICD-10-CM

## 2022-03-24 DIAGNOSIS — N179 Acute kidney failure, unspecified: Secondary | ICD-10-CM | POA: Diagnosis not present

## 2022-03-24 DIAGNOSIS — E785 Hyperlipidemia, unspecified: Secondary | ICD-10-CM

## 2022-03-24 DIAGNOSIS — R569 Unspecified convulsions: Secondary | ICD-10-CM

## 2022-03-24 DIAGNOSIS — R55 Syncope and collapse: Secondary | ICD-10-CM | POA: Diagnosis not present

## 2022-03-24 DIAGNOSIS — R001 Bradycardia, unspecified: Secondary | ICD-10-CM | POA: Diagnosis not present

## 2022-03-24 LAB — GLUCOSE, CAPILLARY
Glucose-Capillary: 58 mg/dL — ABNORMAL LOW (ref 70–99)
Glucose-Capillary: 83 mg/dL (ref 70–99)
Glucose-Capillary: 111 mg/dL — ABNORMAL HIGH (ref 70–99)
Glucose-Capillary: 84 mg/dL (ref 70–99)
Glucose-Capillary: 105 mg/dL — ABNORMAL HIGH (ref 70–99)

## 2022-03-24 LAB — COMPREHENSIVE METABOLIC PANEL
ALT: 9 U/L (ref 0–44)
AST: 24 U/L (ref 15–41)
Albumin: 2.9 g/dL — ABNORMAL LOW (ref 3.5–5.0)
Alkaline Phosphatase: 61 U/L (ref 38–126)
Anion gap: 6 (ref 5–15)
BUN: 20 mg/dL (ref 8–23)
CO2: 21 mmol/L — ABNORMAL LOW (ref 22–32)
Calcium: 8.9 mg/dL (ref 8.9–10.3)
Chloride: 113 mmol/L — ABNORMAL HIGH (ref 98–111)
Creatinine, Ser: 1.36 mg/dL — ABNORMAL HIGH (ref 0.44–1.00)
GFR, Estimated: 38 mL/min — ABNORMAL LOW (ref >60)
Glucose, Bld: 75 mg/dL (ref 70–99)
Potassium: 4.2 mmol/L (ref 3.5–5.1)
Sodium: 140 mmol/L (ref 135–145)
Total Bilirubin: 0.3 mg/dL (ref 0.3–1.2)
Total Protein: 5.9 g/dL — ABNORMAL LOW (ref 6.5–8.1)

## 2022-03-24 LAB — CBC
HCT: 29.3 % — ABNORMAL LOW (ref 36.0–46.0)
Hemoglobin: 9.6 g/dL — ABNORMAL LOW (ref 12.0–15.0)
MCH: 29.2 pg (ref 26.0–34.0)
MCHC: 32.8 g/dL (ref 30.0–36.0)
MCV: 89.1 fL (ref 80.0–100.0)
Platelets: 207 10*3/uL (ref 150–400)
RBC: 3.29 MIL/uL — ABNORMAL LOW (ref 3.87–5.11)
RDW: 12 % (ref 11.5–15.5)
WBC: 4.9 10*3/uL (ref 4.0–10.5)
nRBC: 0 % (ref 0.0–0.2)

## 2022-03-24 MED ORDER — SODIUM CHLORIDE 0.9 % IV SOLN: INTRAVENOUS | Status: AC

## 2022-03-24 MED ORDER — MELATONIN 3 MG PO TABS
3.0000 mg | ORAL_TABLET | Freq: Every day | ORAL | Status: DC
  Administered 2022-03-24 – 2022-03-27 (×2): 3 mg via ORAL
  Filled 2022-03-24 (×3): qty 1

## 2022-03-24 NOTE — Progress Notes (Signed)
Attempted orthostatic vitals again.  Pt. Refuses to stand or cooperate.  Lying and Sitting BP charted

## 2022-03-24 NOTE — TOC Initial Note (Signed)
Transition of Care [facility]) - Initial/Assessment Note    Patient Details  Name: Jennifer Garrett MRN: [id number] Date of Birth: 1932-05-23  [facility] CM/SW Contact:    Geralynn Ochs, LCSW Phone Number: 03/24/2022, 3:25 PM  Clinical Narrative:             CSW spoke with patient's daughter, [name], to discuss patient's current care needs at memory care. Patient from [facility], and per daughter she is usually ambulatory but her balance is off and she has fallen a few times. She is able to dress and feed herself. She has recently began talking to people who aren't there, but she does still have some clear moments where she knows who her daughter is and what's going on. CSW explained to daughter that patient at this time seems to be worse than her baseline, but unsure that rehab would be beneficial given her dementia and mental decline. Daughter's current plan is for patient to return to [facility] at discharge, [facility] may need to verify that they can provide level of care when patient is medically stable. CSW to follow.      Expected Discharge Plan: Memory Care Barriers to Discharge: Continued Medical Work up   Patient Goals and CMS Choice Patient states their goals for this hospitalization and ongoing recovery are:: patient unable to participate in goal setting, not oriented CMS Medicare.gov Compare Post Acute Care list provided to:: Patient Represenative (must comment) Choice offered to / list presented to : Adult Children  Expected Discharge Plan and Services Expected Discharge Plan: Memory Care     Post Acute Care Choice: Nursing Home Living arrangements for the past 2 months: [place]                                      Prior Living Arrangements/Services Living arrangements for the past 2 months: [place] Lives with:: Facility Resident Patient language and need for interpreter reviewed:: No Do you feel  safe going back to the place where you live?: Yes      Need for Family Participation in Patient Care: Yes (Comment) Care giver support system in place?: Yes (comment)   Criminal Activity/Legal Involvement Pertinent to Current Situation/Hospitalization: No - Comment as needed  Activities of Daily Living      Permission Sought/Granted Permission sought to share information with : Facility Sport and exercise psychologist, Family Supports Permission granted to share information with : Yes, Verbal Permission Granted  Share Information with NAME: [name]  Permission granted to share info w AGENCY: [place] granted to share info w Relationship: Daughter     Emotional Assessment   Attitude/Demeanor/Rapport: Unable to Assess Affect (typically observed): Unable to Assess Orientation: : Oriented to Self Alcohol / Substance Use: Not Applicable Psych Involvement: No (comment)  Admission diagnosis:  Bradycardia [R00.1] Chronic kidney disease (CKD), stage IV (severe) (HCC) [N18.4] Seizure-like activity ([place]) [R56.9] Syncope, unspecified syncope type [R55] Anemia, unspecified type [D64.9] Syncope and collapse [R55] Patient Active Problem List   Diagnosis Date Noted   Type 2 diabetes mellitus with complication, without long-term current use of insulin ([place]) 03/22/2022   Vascular dementia ([place]) 03/22/2022   Seizure-like activity ([place]) 03/22/2022   Syncope and collapse with seizure like activity and bradycardia 03/22/2022   Acute renal failure superimposed on stage 3b chronic kidney disease ([place]) 03/22/2022   Normocytic anemia 03/22/2022  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, [place] [address] Spe[place]Phone: [phone]: [address] [place]Phone: [phone]: [phone]    Social Determinants of Health (SDOH) Interventions    Readmission Risk Interventions     No data to display

## 2022-03-24 NOTE — Progress Notes (Signed)
[facility] PROGRESS NOTE    Progress Note  Jennifer Garrett  TGG:[id number] DOB: Dec 27, 1931 DOA: 03/22/2022 PCP: Nolene Ebbs, MD     Brief Narrative:   Jennifer Garrett is an 88 y.o. female past medical history of breast cancer status postmastectomy, CAD, diabetes mellitus type 2, vascular dementia comes in with an unwitnessed fall at skilled nursing facility and seizure-like activity.  EMS got there heart rate was 46 she was given atropine.    Assessment/Plan:   Syncope and collapse with seizure like activity and bradycardia Heart rate was 46 received atropine by EMS. EEG showed slowing suggestive of diffuse encephalopathy no seizure activity. 2D echo showed an EF of 60% no AS.  Grade 2 diastolic heart failure. Patient was on losartan hydrochlorothiazide at home these medications were held. Orthostatic vitals were checked which were negative. Likely due to hypovolemia. Started melatonin at night.  Acute kidney injury on chronic kidney disease stage IIIb: With a baseline creatinine 1.2 on admission 1.7. Started on IV fluids now 1.5. Physical therapy evaluated the patient recommended short-term rehab.  Acute confusional state/acute medical encephalopathy: Melatonin at night continue mittens. Continue mittens.   Essential hypertension: Blood pressure soft on admission in the setting of syncope. Antihypertensive medications were held, blood pressure has been ranging 152/70 to 120/80. Continue to hold antihypertensive medication.  Chronic diastolic heart failure: Antihypertensive medications were held continue to monitor she appears euvolemic on physical exam.  Normocytic anemia: Hemoglobin at baseline noted.  Vascular dementia: Continue Aricept.  Diabetes mellitus type 2: With an A1c of 5.9 continue sliding scale insulin well controlled.  CAD: 2D echo showed no wall motion abnormalities she is currently on aspirin and statins. No events on telemetry.  DVT  prophylaxis: lovenox Family Communication:none Status is: Inpatient Remains inpatient appropriate because: Acute kidney injury    Code Status:     Code Status Orders  (From admission, onward)           Start     Ordered   03/22/22 1329  Full code  Continuous        03/22/22 1332           Code Status History     Date Active Date Inactive Code Status Order ID Comments User Context   05/25/2016 1613 05/27/2016 1614 Full Code [id number]  Fanny Skates, MD Inpatient         IV Access:   Peripheral IV   Procedures and diagnostic studies:   ECHOCARDIOGRAM COMPLETE  Result Date: 03/23/2022    ECHOCARDIOGRAM REPORT   Patient Name:   Jennifer Garrett Date of Exam: 03/23/2022 Medical Rec #:  [id number]     Height:       65.0 in Accession #:    [id number]    Weight:       165.0 lb Date of Birth:  09-24-1931     BSA:          1.823 m Patient Age:    57 years      BP:           121/73 mmHg Patient Gender: F             HR:           56 bpm. Exam Location:  Inpatient Procedure: 2D Echo, Cardiac Doppler and Color Doppler Indications:    Syncope  History:        Patient has no prior history of Echocardiogram examinations.  CAD; Risk Factors:Hypertension, Dyslipidemia and Diabetes.  Sonographer:    Jefferey Pica Referring Phys: [id number] Orma Flaming  Sonographer Comments: Techincally difficult study due to patients constant movement. IMPRESSIONS  1. Left ventricular ejection fraction, by estimation, is 60 to 65%. The left ventricle has normal function. The left ventricle has no regional wall motion abnormalities. Left ventricular diastolic parameters are consistent with Grade II diastolic dysfunction (pseudonormalization). Elevated left atrial pressure.  2. Right ventricular systolic function is normal. The right ventricular size is normal. There is mildly elevated pulmonary artery systolic pressure. The estimated right ventricular systolic pressure is 30.0 mmHg.  3. Left atrial  size was mild to moderately dilated.  4. The mitral valve is degenerative. Mild mitral valve regurgitation. No evidence of mitral stenosis. Moderate mitral annular calcification.  5. Tricuspid valve regurgitation is mild to moderate.  6. The aortic valve was not well visualized. There is moderate calcification of the aortic valve. There is moderate thickening of the aortic valve. Aortic valve regurgitation is trivial. Moderate aortic valve stenosis.  7. The inferior vena cava is normal in size with <50% respiratory variability, suggesting right atrial pressure of 8 mmHg. FINDINGS  Left Ventricle: Left ventricular ejection fraction, by estimation, is 60 to 65%. The left ventricle has normal function. The left ventricle has no regional wall motion abnormalities. The left ventricular internal cavity size was normal in size. There is  no left ventricular hypertrophy. Left ventricular diastolic parameters are consistent with Grade II diastolic dysfunction (pseudonormalization). Elevated left atrial pressure. Right Ventricle: The right ventricular size is normal. No increase in right ventricular wall thickness. Right ventricular systolic function is normal. There is mildly elevated pulmonary artery systolic pressure. The tricuspid regurgitant velocity is 2.64  m/s, and with an assumed right atrial pressure of 8 mmHg, the estimated right ventricular systolic pressure is 92.3 mmHg. Left Atrium: Left atrial size was mild to moderately dilated. Right Atrium: Right atrial size was normal in size. Prominent Eustachian valve. Pericardium: There is no evidence of pericardial effusion. Mitral Valve: The mitral valve is degenerative in appearance. There is mild thickening of the mitral valve leaflet(s). Moderate mitral annular calcification. Mild mitral valve regurgitation, with centrally-directed jet. No evidence of mitral valve stenosis. Tricuspid Valve: The tricuspid valve is normal in structure. Tricuspid valve regurgitation is  mild to moderate. Aortic Valve: The aortic valve was not well visualized. There is moderate calcification of the aortic valve. There is moderate thickening of the aortic valve. Aortic valve regurgitation is trivial. Aortic regurgitation PHT measures 469 msec. Moderate aortic stenosis is present. Aortic valve mean gradient measures 19.5 mmHg. Aortic valve peak gradient measures 34.3 mmHg. Aortic valve area, by VTI measures 1.10 cm. Pulmonic Valve: The pulmonic valve was not well visualized. Aorta: The aortic root is normal in size and structure. Venous: The inferior vena cava is normal in size with less than 50% respiratory variability, suggesting right atrial pressure of 8 mmHg. IAS/Shunts: No atrial level shunt detected by color flow Doppler.  LEFT VENTRICLE PLAX 2D LVIDd:         4.40 cm   Diastology LVIDs:         2.00 cm   LV e' medial:    5.15 cm/s LV PW:         1.00 cm   LV E/e' medial:  24.4 LV IVS:        0.90 cm   LV e' lateral:   6.53 cm/s LVOT diam:     2.00 cm  LV E/e' lateral: 19.3 LV SV:         80 LV SV Index:   44 LVOT Area:     3.14 cm  RIGHT VENTRICLE          IVC RV Basal diam:  3.10 cm  IVC diam: 1.60 cm TAPSE (M-mode): 2.2 cm LEFT ATRIUM             Index        RIGHT ATRIUM           Index LA diam:        4.40 cm 2.41 cm/m   RA Area:     11.10 cm LA Vol (A2C):   81.8 ml 44.87 ml/m  RA Volume:   26.10 ml  14.32 ml/m LA Vol (A4C):   52.6 ml 28.86 ml/m LA Biplane Vol: 67.6 ml 37.08 ml/m  AORTIC VALVE                     PULMONIC VALVE AV Area (Vmax):    1.22 cm      PV Vmax:       0.82 m/s AV Area (Vmean):   1.10 cm      PV Peak grad:  2.7 mmHg AV Area (VTI):     1.10 cm AV Vmax:           293.00 cm/s AV Vmean:          208.500 cm/s AV VTI:            0.729 m AV Peak Grad:      34.3 mmHg AV Mean Grad:      19.5 mmHg LVOT Vmax:         114.00 cm/s LVOT Vmean:        72.900 cm/s LVOT VTI:          0.256 m LVOT/AV VTI ratio: 0.35 AI PHT:            469 msec MITRAL VALVE                 TRICUSPID VALVE MV Area (PHT): 3.87 cm     TR Peak grad:   27.9 mmHg MV Decel Time: 196 msec     TR Vmax:        264.00 cm/s MV E velocity: 125.83 cm/s MV A velocity: 127.19 cm/s  SHUNTS MV E/A ratio:  0.99         Systemic VTI:  0.26 m                             Systemic Diam: 2.00 cm Sanda Klein MD Electronically signed by Sanda Klein MD Signature Date/Time: 03/23/2022/9:25:15 AM    Final    EEG adult  Result Date: 03/23/2022 Lora Havens, MD     03/23/2022  9:16 AM Patient Name: Jennifer Garrett MRN: [id number] Epilepsy Attending: Lora Havens Referring Physician/Provider: Orma Flaming, MD Date: 03/22/2022 Duration: 21.14 mins Patient history: 88 yo F with syncope and seizure like activity. EEG to evaluate for seizure Level of alertness: Awake AEDs during EEG study: None Technical aspects: This EEG study was done with scalp electrodes positioned according to the 10-20 International system of electrode placement. Electrical activity was reviewed with band pass filter of 1-'70Hz'$ , sensitivity of 7 uV/mm, display speed of 57m/sec with a '60Hz'$  notched filter applied as appropriate. EEG data were recorded continuously and digitally stored.  Video monitoring was available and reviewed as appropriate. Description: The posterior dominant rhythm consists of 7 Hz activity of moderate voltage (25-35 uV) seen predominantly in posterior head regions, symmetric and reactive to eye opening and eye closing. EEG showed continuous generalized 5 to 6 Hz theta slowing. Hyperventilation and photic stimulation were not performed.   ABNORMALITY - Continuous slow, generalized - Background slow IMPRESSION: This study is suggestive of mild to moderate diffuse encephalopathy, nonspecific etiology. No seizures or epileptiform discharges were seen throughout the recording. [facility]:   None.   Subjective:    Jennifer Garrett no complaints.  Objective:    Vitals:   03/24/22 0830 03/24/22  0836 03/24/22 0900 03/24/22 0925  BP: 127/68   139/71  Pulse:  71  84  Resp: 18 (!) '21 17 19  '$ Temp:      TempSrc:      SpO2: 100% 100%  96%  Weight:       SpO2: 96 %   Intake/Output Summary (Last 24 hours) at 03/24/2022 1245 Last data filed at 03/24/2022 0836 Gross per 24 hour  Intake 1537.3 ml  Output 400 ml  Net 1137.3 ml   Filed Weights   03/24/22 0500  Weight: 58.8 kg    Exam: General exam: In no acute distress. Respiratory system: Good air movement and clear to auscultation. Cardiovascular system: S1 & S2 heard, RRR. No JVD. Gastrointestinal system: Abdomen is nondistended, soft and nontender.  Extremities: No pedal edema. Skin: No rashes, lesions or ulcers Data Reviewed:    Labs: Basic Metabolic Panel: Recent Labs  Lab 03/22/22 1116 03/22/22 1142 03/22/22 1533 03/23/22 0425 03/24/22 0531  NA 138 136  --  137 140  K 5.1 4.9  --  4.3 4.2  CL 106 109  --  109 113*  CO2 22  --   --  21* 21*  GLUCOSE 120* 112*  --  90 75  BUN 26* 26*  --  26* 20  CREATININE 1.67* 1.70*  --  1.53* 1.36*  CALCIUM 9.4  --   --  8.8* 8.9  MG  --   --  2.0  --   --    GFR CrCl cannot be calculated (Unknown ideal weight.). Liver Function Tests: Recent Labs  Lab 03/22/22 1116 03/24/22 0531  AST 19 24  ALT 8 9  ALKPHOS 69 61  BILITOT 0.4 0.3  PROT 6.5 5.9*  ALBUMIN 3.2* 2.9*   No results for input(s): "LIPASE", "AMYLASE" in the last 168 hours. No results for input(s): "AMMONIA" in the last 168 hours. Coagulation profile Recent Labs  Lab 03/22/22 1533  INR 1.1   COVID-19 Labs  No results for input(s): "DDIMER", "FERRITIN", "LDH", "CRP" in the last 72 hours.  Lab Results  Component Value Date   [place] NEGATIVE 09/28/2019    CBC: Recent Labs  Lab 03/22/22 1116 03/22/22 1142 03/23/22 0425 03/24/22 0531  WBC 5.8  --  6.1 4.9  NEUTROABS 3.9  --   --   --   HGB 10.2* 10.9* 9.6* 9.6*  HCT 33.2* 32.0* 30.5* 29.3*  MCV 91.7  --  89.4 89.1  PLT 235  --   230 207   Cardiac Enzymes: Recent Labs  Lab 03/22/22 1533  CKTOTAL 64   BNP (last 3 results) No results for input(s): "PROBNP" in the last 8760 hours. CBG: Recent Labs  Lab 03/23/22 1157 03/23/22 1620 03/24/22 0645 03/24/22 3212 03/24/22 1208  GLUCAP 85 91 58* 83 111*   D-Dimer: No results for input(s): "DDIMER" in the last 72 hours. Hgb A1c: Recent Labs    03/22/22 1533  HGBA1C 5.9*   Lipid Profile: No results for input(s): "CHOL", "HDL", "LDLCALC", "TRIG", "CHOLHDL", "LDLDIRECT" in the last 72 hours. Thyroid function studies: Recent Labs    03/22/22 1533  TSH 0.540   Anemia work up: Recent Labs    03/22/22 1533  VITAMINB12 262   Sepsis Labs: Recent Labs  Lab 03/22/22 1116 03/23/22 0425 03/24/22 0531  WBC 5.8 6.1 4.9   Microbiology No results found for this or any previous visit (from the past 240 hour(s)).   Medications:    enoxaparin (LOVENOX) injection  30 mg Subcutaneous Q24H   insulin aspart  0-9 Units Subcutaneous TID WC   sodium chloride flush  3 mL Intravenous Q12H   Continuous Infusions:    LOS: 1 day   Charlynne Cousins  [facility]  03/24/2022, 12:45 PM

## 2022-03-25 DIAGNOSIS — R55 Syncope and collapse: Secondary | ICD-10-CM | POA: Diagnosis not present

## 2022-03-25 DIAGNOSIS — N179 Acute kidney failure, unspecified: Secondary | ICD-10-CM | POA: Diagnosis not present

## 2022-03-25 DIAGNOSIS — I10 Essential (primary) hypertension: Secondary | ICD-10-CM | POA: Diagnosis not present

## 2022-03-25 DIAGNOSIS — N184 Chronic kidney disease, stage 4 (severe): Secondary | ICD-10-CM | POA: Diagnosis not present

## 2022-03-25 LAB — BASIC METABOLIC PANEL
Anion gap: 8 (ref 5–15)
BUN: 12 mg/dL (ref 8–23)
CO2: 20 mmol/L — ABNORMAL LOW (ref 22–32)
Calcium: 8.9 mg/dL (ref 8.9–10.3)
Chloride: 113 mmol/L — ABNORMAL HIGH (ref 98–111)
Creatinine, Ser: 1.04 mg/dL — ABNORMAL HIGH (ref 0.44–1.00)
GFR, Estimated: 52 mL/min — ABNORMAL LOW (ref >60)
Glucose, Bld: 73 mg/dL (ref 70–99)
Potassium: 3.9 mmol/L (ref 3.5–5.1)
Sodium: 141 mmol/L (ref 135–145)

## 2022-03-25 LAB — GLUCOSE, CAPILLARY
Glucose-Capillary: 68 mg/dL — ABNORMAL LOW (ref 70–99)
Glucose-Capillary: 73 mg/dL (ref 70–99)
Glucose-Capillary: 95 mg/dL (ref 70–99)
Glucose-Capillary: 116 mg/dL — ABNORMAL HIGH (ref 70–99)

## 2022-03-25 NOTE — Progress Notes (Signed)
Pt refused BG. Balled hands in fist and pulled away from this RN and a tech.

## 2022-03-25 NOTE — Progress Notes (Signed)
Physical Therapy Treatment Patient Details Name: Jennifer Garrett MRN: [id number] DOB: 1932-02-08 Today's Date: 03/25/2022   History of Present Illness Pt is an 88 y.o. female presenting to [facility] ED 03/22/2022 following unwitnessed fall at her SNF and was observed by staff at SNF to be unresponsive with "eyes rolling back" with bradycardic episode to 46bpm. CT scans revealing moderate chrinoc small vessel ischemic disease and EEG revealing mild to moderate diffuse encephalopathy.  PMH significant for HTN, arthritis, breast cancer, irregular heart beat, CAD, disbetes and vascular dememtia.    PT Comments    Patient with excellent progress with mobility and cognition.  More appropriate and able to ambulate in hallway with RW.  Not sure she used walker at facility so will need assistance still, but agree with return to memory care unit at d/c.  PT will continue to follow acutely.    Recommendations for follow up therapy are one component of a multi-disciplinary discharge planning process, led by the attending physician.  Recommendations may be updated based on patient status, additional functional criteria and insurance authorization.  Follow Up Recommendations  Other (comment) (return to memory care unit) Can patient physically be transported by private vehicle: Yes   Assistance Recommended at Discharge Frequent or constant Supervision/Assistance  Patient can return home with the following A lot of help with walking and/or transfers;A lot of help with bathing/dressing/bathroom;Assistance with cooking/housework;Direct supervision/assist for medications management;Direct supervision/assist for financial management;Assist for transportation;Help with stairs or ramp for entrance   Equipment Recommendations  None recommended by PT    Recommendations for Other Services       Precautions / Restrictions Precautions Precautions: Fall Restrictions Weight Bearing Restrictions: No     Mobility  Bed  Mobility               General bed mobility comments: up in chair    Transfers Overall transfer level: Needs assistance Equipment used: Rolling walker (2 wheels) Transfers: Sit to/from Stand Sit to Stand: Min assist, +2 safety/equipment           General transfer comment: initially with posterior bias, assist for hands on walker (more for L hand) and for anterior weight shift; assist to bring [facility] to pt as needing to toilet prior to walking in hallway    Ambulation/Gait Ambulation/Gait assistance: Mod assist, +2 safety/equipment Gait Distance (Feet): 150 Feet Assistive device: Rolling walker (2 wheels) Gait Pattern/deviations: Step-through pattern, Decreased stride length, Wide base of support, Drifts right/left       General Gait Details: initially assist from behind due to posterior bias, assist for walker safety (both hands on walker, keeping feet inside walker, 2 assist at times due to IV pole and chair follow for safety)   Stairs             Wheelchair Mobility    Modified Rankin (Stroke Patients Only)       Balance Overall balance assessment: Needs assistance   Sitting balance-Leahy Scale: Fair Sitting balance - Comments: balance on edge of chair without UE support   Standing balance support: Bilateral upper extremity supported Standing balance-Leahy Scale: Poor Standing balance comment: A for bringing hips over BOS and UE support needed                            Cognition Arousal/Alertness: Awake/alert Behavior During Therapy: WFL for tasks assessed/performed Overall Cognitive Status: No family/caregiver present to determine baseline cognitive functioning  General Comments: appropriate for situation, has sitter and help for breakfast        Exercises General Exercises - Lower Extremity Long Arc Quad: Strengthening, Both, Seated, 20 reps Hip Flexion/Marching: Strengthening, Both,  Seated, 20 reps Other Exercises Other Exercises: sitting forward punches x 10    General Comments General comments (skin integrity, edema, etc.): toileted on BSC; assist for hygiene and donning briefs and pad for ambulation      Pertinent Vitals/Pain Pain Assessment Faces Pain Scale: No hurt    Home Living                          Prior Function            PT Goals (current goals can now be found in the care plan section) Progress towards PT goals: Progressing toward goals    Frequency    Min 2X/week      PT Plan Discharge plan needs to be updated    Co-evaluation              AM-PAC PT "6 Clicks" Mobility   Outcome Measure  Help needed turning from your back to your side while in a flat bed without using bedrails?: A Lot Help needed moving from lying on your back to sitting on the side of a flat bed without using bedrails?: A Lot Help needed moving to and from a bed to a chair (including a wheelchair)?: A Lot Help needed standing up from a chair using your arms (e.g., wheelchair or bedside chair)?: A Lot Help needed to walk in hospital room?: A Lot Help needed climbing 3-5 steps with a railing? : Total 6 Click Score: 11    End of Session Equipment Utilized During Treatment: Gait belt Activity Tolerance: Patient tolerated treatment well Patient left: in chair;with call bell/phone within reach;with chair alarm set;with nursing/sitter in room   PT Visit Diagnosis: Unsteadiness on feet (R26.81);Muscle weakness (generalized) (M62.81);Difficulty in walking, not elsewhere classified (R26.2)     Time: 1030-1050 PT Time Calculation (min) (ACUTE ONLY): 20 min  Charges:  $Gait Training: 8-22 mins                     Magda Kiel, PT Acute Rehabilitation Services Office:[phone] 03/25/2022    Reginia Naas 03/25/2022, 11:03 AM

## 2022-03-25 NOTE — Progress Notes (Signed)
[facility] PROGRESS NOTE    Progress Note  Jennifer Garrett  PXT:[id number] DOB: 03-05-1932 DOA: 03/22/2022 PCP: Nolene Ebbs, MD     Brief Narrative:   Jennifer Garrett is an 88 y.o. female past medical history of breast cancer status postmastectomy, CAD, diabetes mellitus type 2, vascular dementia comes in with an unwitnessed fall at skilled nursing facility and seizure-like activity.  EMS got there heart rate was 46 she was given atropine.    Assessment/Plan:   Syncope and collapse with seizure like activity and bradycardia: EEG showed slowing suggestive of diffuse encephalopathy no seizure activity. 2D echo showed an EF of 60% no AS.  Grade 2 diastolic heart failure. Patient was on losartan hydrochlorothiazide, these medications were held. Likely due to hypovolemia.  Started on IV fluids blood pressure is elevated this morning. Started melatonin at night.  Acute kidney injury on chronic kidney disease stage IIIb: With a baseline creatinine 1.2 on admission 1.7. Likely prerenal azotemia, awaiting basic metabolic panel. Physical therapy evaluated the patient recommended short-term rehab.  Acute confusional state/acute medical encephalopathy: Melatonin at night. Was more calm overnight.  Mittens discontinued.  Essential hypertension: Blood pressure soft on admission in the setting of syncope. Continue to hold antihypertensive medication blood pressures stable.  Chronic diastolic heart failure: Antihypertensive medications were held continue to monitor she appears euvolemic on physical exam.  Normocytic anemia: Hemoglobin at baseline noted.  Vascular dementia: Continue Aricept.  Diabetes mellitus type 2: With an A1c of 5.9 continue sliding scale insulin well controlled.  CAD: 2D echo showed no wall motion abnormalities she is currently on aspirin and statins. No events on telemetry.  DVT prophylaxis: lovenox Family Communication:none Status is:  Inpatient Remains inpatient appropriate because: Acute kidney injury    Code Status:     Code Status Orders  (From admission, onward)           Start     Ordered   03/22/22 1329  Full code  Continuous        03/22/22 1332           Code Status History     Date Active Date Inactive Code Status Order ID Comments User Context   05/25/2016 1613 05/27/2016 1614 Full Code [id number]  Fanny Skates, MD Inpatient         IV Access:   Peripheral IV   Procedures and diagnostic studies:   ECHOCARDIOGRAM COMPLETE  Result Date: 03/23/2022    ECHOCARDIOGRAM REPORT   Patient Name:   Jennifer Garrett Date of Exam: 03/23/2022 Medical Rec #:  [id number]     Height:       65.0 in Accession #:    [id number]    Weight:       165.0 lb Date of Birth:  16-Jan-1932     BSA:          1.823 m Patient Age:    75 years      BP:           121/73 mmHg Patient Gender: F             HR:           56 bpm. Exam Location:  Inpatient Procedure: 2D Echo, Cardiac Doppler and Color Doppler Indications:    Syncope  History:        Patient has no prior history of Echocardiogram examinations.                 CAD; Risk  Factors:Hypertension, Dyslipidemia and Diabetes.  Sonographer:    Jefferey Pica Referring Phys: [id number] Orma Flaming  Sonographer Comments: Techincally difficult study due to patients constant movement. IMPRESSIONS  1. Left ventricular ejection fraction, by estimation, is 60 to 65%. The left ventricle has normal function. The left ventricle has no regional wall motion abnormalities. Left ventricular diastolic parameters are consistent with Grade II diastolic dysfunction (pseudonormalization). Elevated left atrial pressure.  2. Right ventricular systolic function is normal. The right ventricular size is normal. There is mildly elevated pulmonary artery systolic pressure. The estimated right ventricular systolic pressure is 77.4 mmHg.  3. Left atrial size was mild to moderately dilated.  4. The mitral valve  is degenerative. Mild mitral valve regurgitation. No evidence of mitral stenosis. Moderate mitral annular calcification.  5. Tricuspid valve regurgitation is mild to moderate.  6. The aortic valve was not well visualized. There is moderate calcification of the aortic valve. There is moderate thickening of the aortic valve. Aortic valve regurgitation is trivial. Moderate aortic valve stenosis.  7. The inferior vena cava is normal in size with <50% respiratory variability, suggesting right atrial pressure of 8 mmHg. FINDINGS  Left Ventricle: Left ventricular ejection fraction, by estimation, is 60 to 65%. The left ventricle has normal function. The left ventricle has no regional wall motion abnormalities. The left ventricular internal cavity size was normal in size. There is  no left ventricular hypertrophy. Left ventricular diastolic parameters are consistent with Grade II diastolic dysfunction (pseudonormalization). Elevated left atrial pressure. Right Ventricle: The right ventricular size is normal. No increase in right ventricular wall thickness. Right ventricular systolic function is normal. There is mildly elevated pulmonary artery systolic pressure. The tricuspid regurgitant velocity is 2.64  m/s, and with an assumed right atrial pressure of 8 mmHg, the estimated right ventricular systolic pressure is 12.8 mmHg. Left Atrium: Left atrial size was mild to moderately dilated. Right Atrium: Right atrial size was normal in size. Prominent Eustachian valve. Pericardium: There is no evidence of pericardial effusion. Mitral Valve: The mitral valve is degenerative in appearance. There is mild thickening of the mitral valve leaflet(s). Moderate mitral annular calcification. Mild mitral valve regurgitation, with centrally-directed jet. No evidence of mitral valve stenosis. Tricuspid Valve: The tricuspid valve is normal in structure. Tricuspid valve regurgitation is mild to moderate. Aortic Valve: The aortic valve was not  well visualized. There is moderate calcification of the aortic valve. There is moderate thickening of the aortic valve. Aortic valve regurgitation is trivial. Aortic regurgitation PHT measures 469 msec. Moderate aortic stenosis is present. Aortic valve mean gradient measures 19.5 mmHg. Aortic valve peak gradient measures 34.3 mmHg. Aortic valve area, by VTI measures 1.10 cm. Pulmonic Valve: The pulmonic valve was not well visualized. Aorta: The aortic root is normal in size and structure. Venous: The inferior vena cava is normal in size with less than 50% respiratory variability, suggesting right atrial pressure of 8 mmHg. IAS/Shunts: No atrial level shunt detected by color flow Doppler.  LEFT VENTRICLE PLAX 2D LVIDd:         4.40 cm   Diastology LVIDs:         2.00 cm   LV e' medial:    5.15 cm/s LV PW:         1.00 cm   LV E/e' medial:  24.4 LV IVS:        0.90 cm   LV e' lateral:   6.53 cm/s LVOT diam:     2.00 cm  LV E/e' lateral: 19.3 LV SV:         80 LV SV Index:   44 LVOT Area:     3.14 cm  RIGHT VENTRICLE          IVC RV Basal diam:  3.10 cm  IVC diam: 1.60 cm TAPSE (M-mode): 2.2 cm LEFT ATRIUM             Index        RIGHT ATRIUM           Index LA diam:        4.40 cm 2.41 cm/m   RA Area:     11.10 cm LA Vol (A2C):   81.8 ml 44.87 ml/m  RA Volume:   26.10 ml  14.32 ml/m LA Vol (A4C):   52.6 ml 28.86 ml/m LA Biplane Vol: 67.6 ml 37.08 ml/m  AORTIC VALVE                     PULMONIC VALVE AV Area (Vmax):    1.22 cm      PV Vmax:       0.82 m/s AV Area (Vmean):   1.10 cm      PV Peak grad:  2.7 mmHg AV Area (VTI):     1.10 cm AV Vmax:           293.00 cm/s AV Vmean:          208.500 cm/s AV VTI:            0.729 m AV Peak Grad:      34.3 mmHg AV Mean Grad:      19.5 mmHg LVOT Vmax:         114.00 cm/s LVOT Vmean:        72.900 cm/s LVOT VTI:          0.256 m LVOT/AV VTI ratio: 0.35 AI PHT:            469 msec MITRAL VALVE                TRICUSPID VALVE MV Area (PHT): 3.87 cm     TR Peak grad:    27.9 mmHg MV Decel Time: 196 msec     TR Vmax:        264.00 cm/s MV E velocity: 125.83 cm/s MV A velocity: 127.19 cm/s  SHUNTS MV E/A ratio:  0.99         Systemic VTI:  0.26 m                             Systemic Diam: 2.00 cm Sanda Klein MD Electronically signed by Sanda Klein MD Signature Date/Time: 03/23/2022/9:25:15 AM    Final    EEG adult  Result Date: 03/23/2022 Lora Havens, MD     03/23/2022  9:16 AM Patient Name: Jennifer Garrett MRN: [id number] Epilepsy Attending: Lora Havens Referring Physician/Provider: Orma Flaming, MD Date: 03/22/2022 Duration: 21.14 mins Patient history: 88 yo F with syncope and seizure like activity. EEG to evaluate for seizure Level of alertness: Awake AEDs during EEG study: None Technical aspects: This EEG study was done with scalp electrodes positioned according to the 10-20 International system of electrode placement. Electrical activity was reviewed with band pass filter of 1-'70Hz'$ , sensitivity of 7 uV/mm, display speed of 9m/sec with a '60Hz'$  notched filter applied as appropriate. EEG data were recorded continuously and digitally stored.  Video monitoring was available and reviewed as appropriate. Description: The posterior dominant rhythm consists of 7 Hz activity of moderate voltage (25-35 uV) seen predominantly in posterior head regions, symmetric and reactive to eye opening and eye closing. EEG showed continuous generalized 5 to 6 Hz theta slowing. Hyperventilation and photic stimulation were not performed.   ABNORMALITY - Continuous slow, generalized - Background slow IMPRESSION: This study is suggestive of mild to moderate diffuse encephalopathy, nonspecific etiology. No seizures or epileptiform discharges were seen throughout the recording. [employer]:   None.   Subjective:    Jennifer Garrett has no new complaints today.  Objective:    Vitals:   03/24/22 2025 03/25/22 0012 03/25/22 0410 03/25/22 0825  BP: (!)  143/78 137/61 (!) 140/58 (!) 142/83  Pulse:  60 (!) 48 (!) 58  Resp: '20 16 16 18  '$ Temp: 98.9 F (37.2 C) 98.3 F (36.8 C) 98.3 F (36.8 C) (!) 97.5 F (36.4 C)  TempSrc: Axillary Oral Axillary Oral  SpO2: 96% 96%  100%  Weight:   58.3 kg    SpO2: 100 %   Intake/Output Summary (Last 24 hours) at 03/25/2022 0840 Last data filed at 03/24/2022 1720 Gross per 24 hour  Intake 658.27 ml  Output --  Net 658.27 ml    Filed Weights   03/24/22 0500 03/25/22 0410  Weight: 58.8 kg 58.3 kg    Exam: General exam: In no acute distress. Respiratory system: Good air movement and clear to auscultation. Cardiovascular system: S1 & S2 heard, RRR. No JVD. Gastrointestinal system: Abdomen is nondistended, soft and nontender.  Extremities: No pedal edema. Skin: No rashes, lesions or ulcers Data Reviewed:    Labs: Basic Metabolic Panel: Recent Labs  Lab 03/22/22 1116 03/22/22 1142 03/22/22 1533 03/23/22 0425 03/24/22 0531  NA 138 136  --  137 140  K 5.1 4.9  --  4.3 4.2  CL 106 109  --  109 113*  CO2 22  --   --  21* 21*  GLUCOSE 120* 112*  --  90 75  BUN 26* 26*  --  26* 20  CREATININE 1.67* 1.70*  --  1.53* 1.36*  CALCIUM 9.4  --   --  8.8* 8.9  MG  --   --  2.0  --   --     GFR CrCl cannot be calculated (Unknown ideal weight.). Liver Function Tests: Recent Labs  Lab 03/22/22 1116 03/24/22 0531  AST 19 24  ALT 8 9  ALKPHOS 69 61  BILITOT 0.4 0.3  PROT 6.5 5.9*  ALBUMIN 3.2* 2.9*    No results for input(s): "LIPASE", "AMYLASE" in the last 168 hours. No results for input(s): "AMMONIA" in the last 168 hours. Coagulation profile Recent Labs  Lab 03/22/22 1533  INR 1.1    COVID-19 Labs  No results for input(s): "DDIMER", "FERRITIN", "LDH", "CRP" in the last 72 hours.  Lab Results  Component Value Date   [place] NEGATIVE 09/28/2019    CBC: Recent Labs  Lab 03/22/22 1116 03/22/22 1142 03/23/22 0425 03/24/22 0531  WBC 5.8  --  6.1 4.9  NEUTROABS 3.9   --   --   --   HGB 10.2* 10.9* 9.6* 9.6*  HCT 33.2* 32.0* 30.5* 29.3*  MCV 91.7  --  89.4 89.1  PLT 235  --  230 207    Cardiac Enzymes: Recent Labs  Lab 03/22/22 1533  CKTOTAL 64  BNP (last 3 results) No results for input(s): "PROBNP" in the last 8760 hours. CBG: Recent Labs  Lab 03/24/22 1208 03/24/22 1650 03/24/22 2152 03/25/22 0441 03/25/22 0455  GLUCAP 111* 84 105* 68* 73    D-Dimer: No results for input(s): "DDIMER" in the last 72 hours. Hgb A1c: Recent Labs    03/22/22 1533  HGBA1C 5.9*    Lipid Profile: No results for input(s): "CHOL", "HDL", "LDLCALC", "TRIG", "CHOLHDL", "LDLDIRECT" in the last 72 hours. Thyroid function studies: Recent Labs    03/22/22 1533  TSH 0.540    Anemia work up: Recent Labs    03/22/22 1533  VITAMINB12 262    Sepsis Labs: Recent Labs  Lab 03/22/22 1116 03/23/22 0425 03/24/22 0531  WBC 5.8 6.1 4.9    Microbiology No results found for this or any previous visit (from the past 240 hour(s)).   Medications:    enoxaparin (LOVENOX) injection  30 mg Subcutaneous Q24H   insulin aspart  0-9 Units Subcutaneous TID WC   melatonin  3 mg Oral QHS   sodium chloride flush  3 mL Intravenous Q12H   Continuous Infusions:  sodium chloride 75 mL/hr at 03/25/22 0441      LOS: 2 days   Charlynne Cousins  [facility]  03/25/2022, 8:40 AM

## 2022-03-26 DIAGNOSIS — N179 Acute kidney failure, unspecified: Secondary | ICD-10-CM | POA: Diagnosis not present

## 2022-03-26 DIAGNOSIS — R55 Syncope and collapse: Secondary | ICD-10-CM | POA: Diagnosis not present

## 2022-03-26 DIAGNOSIS — N184 Chronic kidney disease, stage 4 (severe): Secondary | ICD-10-CM | POA: Diagnosis not present

## 2022-03-26 DIAGNOSIS — I10 Essential (primary) hypertension: Secondary | ICD-10-CM | POA: Diagnosis not present

## 2022-03-26 LAB — GLUCOSE, CAPILLARY
Glucose-Capillary: 88 mg/dL (ref 70–99)
Glucose-Capillary: 91 mg/dL (ref 70–99)
Glucose-Capillary: 99 mg/dL (ref 70–99)
Glucose-Capillary: 86 mg/dL (ref 70–99)

## 2022-03-26 LAB — BASIC METABOLIC PANEL
Anion gap: 7 (ref 5–15)
BUN: 14 mg/dL (ref 8–23)
CO2: 19 mmol/L — ABNORMAL LOW (ref 22–32)
Calcium: 8.5 mg/dL — ABNORMAL LOW (ref 8.9–10.3)
Chloride: 110 mmol/L (ref 98–111)
Creatinine, Ser: 1.18 mg/dL — ABNORMAL HIGH (ref 0.44–1.00)
GFR, Estimated: 45 mL/min — ABNORMAL LOW (ref >60)
Glucose, Bld: 83 mg/dL (ref 70–99)
Potassium: 3.8 mmol/L (ref 3.5–5.1)
Sodium: 136 mmol/L (ref 135–145)

## 2022-03-26 MED ORDER — IRBESARTAN 75 MG PO TABS
37.5000 mg | ORAL_TABLET | Freq: Every day | ORAL | Status: DC
  Administered 2022-03-26 – 2022-03-29 (×4): 37.5 mg via ORAL
  Filled 2022-03-26 (×4): qty 0.5

## 2022-03-26 MED ORDER — SIMVASTATIN 20 MG PO TABS
20.0000 mg | ORAL_TABLET | Freq: Every day | ORAL | Status: DC
  Administered 2022-03-26 – 2022-03-29 (×4): 20 mg via ORAL
  Filled 2022-03-26 (×4): qty 1

## 2022-03-26 MED ORDER — ASPIRIN 81 MG PO CHEW
81.0000 mg | CHEWABLE_TABLET | Freq: Every day | ORAL | Status: DC
  Administered 2022-03-26 – 2022-03-29 (×4): 81 mg via ORAL
  Filled 2022-03-26 (×4): qty 1

## 2022-03-26 MED ORDER — DONEPEZIL HCL 10 MG PO TABS
5.0000 mg | ORAL_TABLET | Freq: Every day | ORAL | Status: DC
  Administered 2022-03-26 – 2022-03-29 (×4): 5 mg via ORAL
  Filled 2022-03-26 (×4): qty 1

## 2022-03-26 NOTE — Progress Notes (Signed)
Occupational Therapy Treatment Patient Details Name: Jennifer Garrett MRN: [id number] DOB: 06/07/1932 Today's Date: 03/26/2022   History of present illness Pt is an 88 y.o. female presenting [facility] ED [facility]/22/2022 following unwitnessed fall at her SNF and was observed by staff at SNF to be unresponsive with "eyes rolling back" with bradycardic episode to 46bpm. CT scans revealing moderate chrinoc small vessel ischemic disease and EEG revealing mild to moderate diffuse encephalopathy.  PMH significant for HTN, arthritis, breast cancer, irregular heart beat, CAD, disbetes and vascular dememtia.   OT comments  Pt with excellent progression towards established OT goals. Pt continues with decreased balance and ability to sequence during ADLs. Pt performing stand pivot transfer with min face to face assist 2x this session with greater participation/success supporting self on therapist arms. Pt requiring total A for anterior and posterior pericare this session. Donning new gown and performing oral care with mouth wash with mod A and step by step verbal cues. Pt with greater communication this session, requiring interpretation/min A for word finding throughout. Pt with visual and cognitive difficulties consistent with prior diagnosis. Recommend return to memory care unit at discharge. OT will continue to follow acutely.    Recommendations for follow up therapy are one component of a multi-disciplinary discharge planning process, led by the attending physician.  Recommendations may be updated based on patient status, additional functional criteria and insurance authorization.    Follow Up Recommendations  Skilled nursing-short term rehab (<3 hours/day)    Assistance Recommended at Discharge Frequent or constant Supervision/Assistance  Patient can return home with the following  Assistance with cooking/housework;Direct supervision/assist for medications management;Direct supervision/assist for financial  management;Assist for transportation;Help with stairs or ramp for entrance;Assistance with feeding;A little help with walking and/or transfers;A lot of help with bathing/dressing/bathroom   Equipment Recommendations  Other (comment) (defer to next venue)    Recommendations for Other Services      Precautions / Restrictions Precautions Precautions: Fall Restrictions Weight Bearing Restrictions: No       Mobility Bed Mobility Overal bed mobility: Needs Assistance Bed Mobility: Sit to Supine       Sit to supine: Min assist   General bed mobility comments: Min A for BLE management to return to bed    Transfers Overall transfer level: Needs assistance Equipment used: 1 person hand held assist               General transfer comment: Pt with greater engagement and independence with face to face transfer this session. Pt with mild posterior lean, and requiring up to min A for standing balance, but pt maintaining balance with more independence with supporting self holding onto therapists arms using face to face transfer. Reaching out with other UE when only one UE is supported, and greater posterior bias     Balance Overall balance assessment: Needs assistance Sitting-balance support: Bilateral upper extremity supported, No upper extremity supported, Feet supported Sitting balance-Leahy Scale: Fair Sitting balance - Comments: posterior lean sitting EOB Postural control: Posterior lean Standing balance support: Bilateral upper extremity supported Standing balance-Leahy Scale: Poor Standing balance comment: A for bringing hips over BOS and UE support needed                           ADL either performed or assessed with clinical judgement   ADL Overall ADL's : Needs assistance/impaired     Grooming: Oral care;Bed level;Maximal assistance Grooming Details (indicate cue type and reason):  Pt requiring max A for oral care using mouth wash. Unsure how to initiate  task and OT providing hand and step by step cues. Pt with ability to initiate movement of fluid in mouth and spit into cup held by therapist. Pt holding cloth at chest to prevent spillage. Very participative         Upper Body Dressing : Moderate assistance;Cueing for sequencing;Sitting Upper Body Dressing Details (indicate cue type and reason): donning new gown with mod A and step by step cues     Toilet Transfer: Minimal assistance;Stand-pivot;BSC/3in1 Toilet Transfer Details (indicate cue type and reason): Min A to [facility] and then to bed with SPT. Pt requiring verbal encouragement and cues for transfer. Pt able to initiate scoot toward edge of chair as well as stand but requiring cues for acceleration of BLE and hand held assist. OT providing face to face assist for transfer to increased patient comfort Toileting- Clothing Manipulation and Hygiene: Total assistance;Sit to/from stand Toileting - Clothing Manipulation Details (indicate cue type and reason): Nurse tech providing posterior pericare and management of underpants with pt supportig self on OT. Min A to maintain balance due to posterior lean     Functional mobility during ADLs: Minimal assistance (face to face stand pivot transfers this session) General ADL Comments: Pt benefitting from BUE support of therapist to maintain balance. Frequent cueing and encouragement.    Extremity/Trunk Assessment Upper Extremity Assessment Upper Extremity Assessment: Generalized weakness   Lower Extremity Assessment Lower Extremity Assessment: Generalized weakness        Vision   Vision Assessment?: Vision impaired- to be further tested in functional context Additional Comments: Pt with decreased skill to scan to locate items, and with greater engagement with therapist facilitating initiation of tasks/conversation with items or therapist within pt visual line of sight.   Perception     Praxis      Cognition Arousal/Alertness:  Awake/alert Behavior During Therapy: WFL for tasks assessed/performed Overall Cognitive Status: No family/caregiver present to determine baseline cognitive functioning                                 General Comments: Cog appropriate for situation. Benefits from encouragement and step by step verbal commands. With decreased problem solving. Requiring assistance for sequencing to use mouth wash, perform transfers. Pt requiring fewer cues for sit<>supine durig bed mobility.        Exercises      Shoulder Instructions       General Comments Communicating need to "wet"    Pertinent Vitals/ Pain       Pain Assessment Pain Assessment: Faces Faces Pain Scale: No hurt Pain Intervention(s): Monitored during session  Home Living                                          Prior Functioning/Environment              Frequency  Min 2X/week        Progress Toward Goals  OT Goals(current goals can now be found in the care plan section)  Progress towards OT goals: Progressing toward goals  Acute Rehab OT Goals Patient Stated Goal: Pt requesting blanket as well as mouth wash instead of tooth brush (assistance for word finding) OT Goal Formulation: With patient Time For Goal Achievement: 04/06/22 Potential to  Achieve Goals: Fair ADL Goals Pt Will Perform Grooming: with set-up;sitting Pt Will Perform Upper Body Dressing: with mod assist;sitting Pt Will Perform Lower Body Dressing: with mod assist;sit to/from stand Pt Will Transfer to Toilet: with mod assist;stand pivot transfer;bedside commode Additional ADL Goal #1: Pt will follow 2/4 verbal commands with min cues. Additional ADL Goal #2: Pt will perform bed mobility with min A.  Plan Discharge plan remains appropriate    Co-evaluation                 AM-PAC OT "6 Clicks" Daily Activity     Outcome Measure   Help from another person eating meals?: A Lot Help from another person  taking care of personal grooming?: A Lot Help from another person toileting, which includes using toliet, bedpan, or urinal?: A Lot Help from another person bathing (including washing, rinsing, drying)?: A Lot Help from another person to put on and taking off regular upper body clothing?: A Lot Help from another person to put on and taking off regular lower body clothing?: A Lot 6 Click Score: 12    End of Session Equipment Utilized During Treatment: Gait belt  OT Visit Diagnosis: Unsteadiness on feet (R26.81);Muscle weakness (generalized) (M62.81);Other symptoms and signs involving cognitive function;Cognitive communication deficit (R41.841);Low vision, both eyes (H54.2) Symptoms and signs involving cognitive functions: Other cerebrovascular disease   Activity Tolerance Patient tolerated treatment well   Patient Left in bed;with call bell/phone within reach;with bed alarm set;with nursing/sitter in room   Nurse Communication Mobility status        Time: 6122-4497 OT Time Calculation (min): 29 min  Charges: OT General Charges $OT Visit: 1 Visit OT Treatments $Self Care/Home Management : 23-37 mins  Shanda Howells, OTR/L [facility] Acute Rehabilitation Office: [phone]   Lula Olszewski 03/26/2022, 3:55 PM

## 2022-03-26 NOTE — Progress Notes (Signed)
[facility] PROGRESS NOTE    Progress Note  Jennifer Garrett  KNL:[id number] DOB: 08/28/1931 DOA: 03/22/2022 PCP: Nolene Ebbs, MD     Brief Narrative:   Jennifer Garrett is an 88 y.o. female past medical history of breast cancer status postmastectomy, CAD, diabetes mellitus type 2, vascular dementia comes in with an unwitnessed fall at skilled nursing facility and seizure-like activity.  EMS got there heart rate was 46 she was given atropine.    Assessment/Plan:   Syncope and collapse with seizure like activity and bradycardia: EEG showed slowing suggestive of diffuse encephalopathy no seizure activity. 2D echo showed an EF of 60% no AS.  Grade 2 diastolic heart failure. Patient was on losartan hydrochlorothiazide, these medications were held. Likely due to hypovolemia.  Started on IV fluids blood pressure is elevated this morning. Started melatonin at night.  Acute kidney injury on chronic kidney disease stage IIIb: With a baseline creatinine 1.2 on admission 1.7. Renal function has returned to baseline. Physical therapy evaluated the recommended return to memory care unit. Awaiting placement to memory care unit. The patient is medically stable to transfer.  Acute confusional state/acute medical encephalopathy: Melatonin at night. Was more calm overnight.  Mittens discontinued.  Essential hypertension: Blood pressure soft on admission in the setting of syncope. Continue to hold antihypertensive medication blood pressures stable.  Chronic diastolic heart failure: Antihypertensive medications were held continue to monitor she appears euvolemic on physical exam.  Normocytic anemia: Hemoglobin at baseline noted.  Vascular dementia: Continue Aricept.  Diabetes mellitus type 2: With an A1c of 5.9 continue sliding scale insulin well controlled.  CAD: 2D echo showed no wall motion abnormalities she is currently on aspirin and statins. No events on telemetry.  DVT  prophylaxis: lovenox Family Communication:none Status is: Inpatient Remains inpatient appropriate because: Acute kidney injury    Code Status:     Code Status Orders  (From admission, onward)           Start     Ordered   03/22/22 1329  Full code  Continuous        03/22/22 1332           Code Status History     Date Active Date Inactive Code Status Order ID Comments User Context   05/25/2016 1613 05/27/2016 1614 Full Code [id number]  Fanny Skates, MD Inpatient         IV Access:   Peripheral IV   Procedures and diagnostic studies:   No results found.   Medical Consultants:   None.   Subjective:    Jennifer Garrett no complaints today.  Objective:    Vitals:   03/26/22 0300 03/26/22 0500 03/26/22 0801 03/26/22 1129  BP: (!) 143/55  138/65 137/65  Pulse: 76  (!) 58 (!) 58  Resp: 20     Temp: 98.1 F (36.7 C)  98 F (36.7 C) (!) 97.3 F (36.3 C)  TempSrc: Oral  Oral Oral  SpO2: 100%  100% 100%  Weight:  58.7 kg     SpO2: 100 %   Intake/Output Summary (Last 24 hours) at 03/26/2022 1150 Last data filed at 03/26/2022 1023 Gross per 24 hour  Intake 240 ml  Output --  Net 240 ml    Filed Weights   03/24/22 0500 03/25/22 0410 03/26/22 0500  Weight: 58.8 kg 58.3 kg 58.7 kg    Exam: General exam: In no acute distress. Respiratory system: Good air movement and clear to auscultation. Cardiovascular system:  S1 & S2 heard, RRR. No JVD. Gastrointestinal system: Abdomen is nondistended, soft and nontender.  Extremities: No pedal edema. Skin: No rashes, lesions or ulcers Psychiatry: Judgement and insight appear normal. Mood & affect appropriate. Data Reviewed:    Labs: Basic Metabolic Panel: Recent Labs  Lab 03/22/22 1116 03/22/22 1142 03/22/22 1533 03/23/22 0425 03/24/22 0531 03/25/22 0927 03/26/22 0409  NA 138 136  --  137 140 141 136  K 5.1 4.9  --  4.3 4.2 3.9 3.8  CL 106 109  --  109 113* 113* 110  CO2 22  --   --  21* 21*  20* 19*  GLUCOSE 120* 112*  --  90 75 73 83  BUN 26* 26*  --  26* '20 12 14  '$ CREATININE 1.67* 1.70*  --  1.53* 1.36* 1.04* 1.18*  CALCIUM 9.4  --   --  8.8* 8.9 8.9 8.5*  MG  --   --  2.0  --   --   --   --     GFR CrCl cannot be calculated (Unknown ideal weight.). Liver Function Tests: Recent Labs  Lab 03/22/22 1116 03/24/22 0531  AST 19 24  ALT 8 9  ALKPHOS 69 61  BILITOT 0.4 0.3  PROT 6.5 5.9*  ALBUMIN 3.2* 2.9*    No results for input(s): "LIPASE", "AMYLASE" in the last 168 hours. No results for input(s): "AMMONIA" in the last 168 hours. Coagulation profile Recent Labs  Lab 03/22/22 1533  INR 1.1    COVID-19 Labs  No results for input(s): "DDIMER", "FERRITIN", "LDH", "CRP" in the last 72 hours.  Lab Results  Component Value Date   [place] NEGATIVE 09/28/2019    CBC: Recent Labs  Lab 03/22/22 1116 03/22/22 1142 03/23/22 0425 03/24/22 0531  WBC 5.8  --  6.1 4.9  NEUTROABS 3.9  --   --   --   HGB 10.2* 10.9* 9.6* 9.6*  HCT 33.2* 32.0* 30.5* 29.3*  MCV 91.7  --  89.4 89.1  PLT 235  --  230 207    Cardiac Enzymes: Recent Labs  Lab 03/22/22 1533  CKTOTAL 64    BNP (last 3 results) No results for input(s): "PROBNP" in the last 8760 hours. CBG: Recent Labs  Lab 03/25/22 0441 03/25/22 0455 03/25/22 1103 03/25/22 1619 03/26/22 0637  GLUCAP 68* 73 95 116* 88    D-Dimer: No results for input(s): "DDIMER" in the last 72 hours. Hgb A1c: No results for input(s): "HGBA1C" in the last 72 hours.  Lipid Profile: No results for input(s): "CHOL", "HDL", "LDLCALC", "TRIG", "CHOLHDL", "LDLDIRECT" in the last 72 hours. Thyroid function studies: No results for input(s): "TSH", "T4TOTAL", "T3FREE", "THYROIDAB" in the last 72 hours.  Invalid input(s): "FREET3"  Anemia work up: No results for input(s): "VITAMINB12", "FOLATE", "FERRITIN", "TIBC", "IRON", "RETICCTPCT" in the last 72 hours.  Sepsis Labs: Recent Labs  Lab 03/22/22 1116  03/23/22 0425 03/24/22 0531  WBC 5.8 6.1 4.9    Microbiology No results found for this or any previous visit (from the past 240 hour(s)).   Medications:    enoxaparin (LOVENOX) injection  30 mg Subcutaneous Q24H   insulin aspart  0-9 Units Subcutaneous TID WC   melatonin  3 mg Oral QHS   sodium chloride flush  3 mL Intravenous Q12H   Continuous Infusions:     LOS: 3 days   Jennifer Cousins  [facility]  03/26/2022, 11:50 AM

## 2022-03-27 DIAGNOSIS — R55 Syncope and collapse: Secondary | ICD-10-CM | POA: Diagnosis not present

## 2022-03-27 LAB — GLUCOSE, CAPILLARY
Glucose-Capillary: 92 mg/dL (ref 70–99)
Glucose-Capillary: 110 mg/dL — ABNORMAL HIGH (ref 70–99)
Glucose-Capillary: 79 mg/dL (ref 70–99)

## 2022-03-27 NOTE — Progress Notes (Signed)
[facility] PROGRESS NOTE    Progress Note  Jennifer Garrett  [id number]:[id number] DOB: 07-22-1931 DOA: 03/22/2022 PCP: Nolene Ebbs, MD     Brief Narrative:   Jennifer Garrett is an 88 y.o. female past medical history of breast cancer status postmastectomy, CAD, diabetes mellitus type 2, vascular dementia comes in with an unwitnessed fall at skilled nursing facility and seizure-like activity.  EMS got there heart rate was 46 she was given atropine.    Assessment/Plan:   Syncope and collapse with seizure like activity and bradycardia: EEG showed slowing suggestive of diffuse encephalopathy no seizure activity. 2D echo showed an EF of 60% no AAS, grade 2 diastolic heart failure. Her diuretic and ARB were held on admission. Likely due to hypovolemia, which resolved with fluid resuscitation. Continue melatonin at night. Started melatonin at night. Physical therapy evaluated the patient, agreed that the patient could go back to memory care unit.  Acute kidney injury on chronic kidney disease stage IIIb: With a baseline creatinine 1.2 on admission 1.7. Renal function has returned to baseline. Awaiting placement to memory care unit. The patient is medically stable to transfer.  Acute confusional state/acute medical encephalopathy: Melatonin at night. Was more calm overnight.  Mittens discontinued.  Essential hypertension: Blood pressure soft on admission in the setting of syncope. ARB was resumed blood pressure is improved, she will probably have to be discharged off hydrochlorothiazide.  Chronic diastolic heart failure: Antihypertensive medications were held on admission, resume ARB.  She will probably have to go home off hydrochlorothiazide.  Normocytic anemia: Hemoglobin at baseline noted.  Vascular dementia: Continue Aricept.  Diabetes mellitus type 2: With an A1c of 5.9 continue sliding scale insulin well controlled.  CAD: 2D echo showed no wall motion abnormalities she  is currently on aspirin and statins. No events on telemetry.  DVT prophylaxis: lovenox Family Communication:none Status is: Inpatient Remains inpatient appropriate because: Acute kidney injury    Code Status:     Code Status Orders  (From admission, onward)           Start     Ordered   03/22/22 1329  Full code  Continuous        03/22/22 1332           Code Status History     Date Active Date Inactive Code Status Order ID Comments User Context   05/25/2016 1613 05/27/2016 1614 Full Code [id number]  Fanny Skates, MD Inpatient         IV Access:   Peripheral IV   Procedures and diagnostic studies:   No results found.   Medical Consultants:   None.   Subjective:    Jennifer Garrett no complaints today.  Objective:    Vitals:   03/26/22 2000 03/27/22 0043 03/27/22 0400 03/27/22 0759  BP: (!) 102/49 (!) 111/54 (!) 111/55 133/83  Pulse: 60 66 60 75  Resp: '16 15 16   '$ Temp: 98.2 F (36.8 C) 98.4 F (36.9 C) 98.1 F (36.7 C)   TempSrc: Axillary Axillary Axillary   SpO2: 100% 100% 100% 97%  Weight:       SpO2: 97 %   Intake/Output Summary (Last 24 hours) at 03/27/2022 0835 Last data filed at 03/26/2022 1426 Gross per 24 hour  Intake 240 ml  Output --  Net 240 ml    Filed Weights   03/24/22 0500 03/25/22 0410 03/26/22 0500  Weight: 58.8 kg 58.3 kg 58.7 kg    Exam: General exam: In no  acute distress. Respiratory system: Good air movement and clear to auscultation. Cardiovascular system: S1 & S2 heard, RRR. No JVD. Gastrointestinal system: Abdomen is nondistended, soft and nontender.  Extremities: No pedal edema. Skin: No rashes, lesions or ulcers Psychiatry: No judgement and insight appear normal Data Reviewed:    Labs: Basic Metabolic Panel: Recent Labs  Lab 03/22/22 1116 03/22/22 1142 03/22/22 1533 03/23/22 0425 03/24/22 0531 03/25/22 0927 03/26/22 0409  NA 138 136  --  137 140 141 136  K 5.1 4.9  --  4.3 4.2 3.9 3.8   CL 106 109  --  109 113* 113* 110  CO2 22  --   --  21* 21* 20* 19*  GLUCOSE 120* 112*  --  90 75 73 83  BUN 26* 26*  --  26* '20 12 14  '$ CREATININE 1.67* 1.70*  --  1.53* 1.36* 1.04* 1.18*  CALCIUM 9.4  --   --  8.8* 8.9 8.9 8.5*  MG  --   --  2.0  --   --   --   --     GFR CrCl cannot be calculated (Unknown ideal weight.). Liver Function Tests: Recent Labs  Lab 03/22/22 1116 03/24/22 0531  AST 19 24  ALT 8 9  ALKPHOS 69 61  BILITOT 0.4 0.3  PROT 6.5 5.9*  ALBUMIN 3.2* 2.9*    No results for input(s): "LIPASE", "AMYLASE" in the last 168 hours. No results for input(s): "AMMONIA" in the last 168 hours. Coagulation profile Recent Labs  Lab 03/22/22 1533  INR 1.1    COVID-19 Labs  No results for input(s): "DDIMER", "FERRITIN", "LDH", "CRP" in the last 72 hours.  Lab Results  Component Value Date   [place] NEGATIVE 09/28/2019    CBC: Recent Labs  Lab 03/22/22 1116 03/22/22 1142 03/23/22 0425 03/24/22 0531  WBC 5.8  --  6.1 4.9  NEUTROABS 3.9  --   --   --   HGB 10.2* 10.9* 9.6* 9.6*  HCT 33.2* 32.0* 30.5* 29.3*  MCV 91.7  --  89.4 89.1  PLT 235  --  230 207    Cardiac Enzymes: Recent Labs  Lab 03/22/22 1533  CKTOTAL 64    BNP (last 3 results) No results for input(s): "PROBNP" in the last 8760 hours. CBG: Recent Labs  Lab 03/26/22 0637 03/26/22 1331 03/26/22 1734 03/26/22 2127 03/27/22 0619  GLUCAP 88 91 99 86 92    D-Dimer: No results for input(s): "DDIMER" in the last 72 hours. Hgb A1c: No results for input(s): "HGBA1C" in the last 72 hours.  Lipid Profile: No results for input(s): "CHOL", "HDL", "LDLCALC", "TRIG", "CHOLHDL", "LDLDIRECT" in the last 72 hours. Thyroid function studies: No results for input(s): "TSH", "T4TOTAL", "T3FREE", "THYROIDAB" in the last 72 hours.  Invalid input(s): "FREET3"  Anemia work up: No results for input(s): "VITAMINB12", "FOLATE", "FERRITIN", "TIBC", "IRON", "RETICCTPCT" in the last 72  hours.  Sepsis Labs: Recent Labs  Lab 03/22/22 1116 03/23/22 0425 03/24/22 0531  WBC 5.8 6.1 4.9    Microbiology No results found for this or any previous visit (from the past 240 hour(s)).   Medications:    aspirin  81 mg Oral Daily   donepezil  5 mg Oral Daily   enoxaparin (LOVENOX) injection  30 mg Subcutaneous Q24H   insulin aspart  0-9 Units Subcutaneous TID WC   irbesartan  37.5 mg Oral Daily   melatonin  3 mg Oral QHS   simvastatin  20 mg Oral Daily  Continuous Infusions:     LOS: 4 days   Charlynne Cousins  [facility]  03/27/2022, 8:35 AM

## 2022-03-28 DIAGNOSIS — R55 Syncope and collapse: Secondary | ICD-10-CM | POA: Diagnosis not present

## 2022-03-28 LAB — GLUCOSE, CAPILLARY
Glucose-Capillary: 67 mg/dL — ABNORMAL LOW (ref 70–99)
Glucose-Capillary: 75 mg/dL (ref 70–99)
Glucose-Capillary: 79 mg/dL (ref 70–99)
Glucose-Capillary: 69 mg/dL — ABNORMAL LOW (ref 70–99)
Glucose-Capillary: 62 mg/dL — ABNORMAL LOW (ref 70–99)
Glucose-Capillary: 104 mg/dL — ABNORMAL HIGH (ref 70–99)

## 2022-03-28 NOTE — Progress Notes (Signed)
[facility] PROGRESS NOTE    Progress Note  Jennifer Garrett  IDP:[id number] DOB: 1931-09-26 DOA: 03/22/2022 PCP: Nolene Ebbs, MD     Brief Narrative:   Jennifer Garrett is an 88 y.o. female past medical history of breast cancer status postmastectomy, CAD, diabetes mellitus type 2, vascular dementia comes in with an unwitnessed fall at skilled nursing facility and seizure-like activity.  EMS got there heart rate was 46 she was given atropine.    Assessment/Plan:   Syncope and collapse with seizure like activity and bradycardia: EEG showed slowing suggestive of diffuse encephalopathy no seizure activity. She has had no seizure events here in the hospital. 2D echo showed an EF of 60% no AAS, grade 2 diastolic heart failure. Likely due to hypovolemia, which resolved with fluid resuscitation. Continue melatonin at night. Started melatonin at night. Physical therapy evaluated the patient, agreed that the patient could go back to memory care unit.  Acute kidney injury on chronic kidney disease stage IIIb: With a baseline creatinine 1.2 on admission 1.7. Renal function has returned to baseline. Awaiting placement to memory care unit. The patient is medically stable to transfer.  Acute confusional state/acute medical encephalopathy: Melatonin at night. Was more calm overnight.  Mittens discontinued.  Essential hypertension: Blood pressure soft on admission in the setting of syncope. ARB was resumed blood pressure is improved, she will probably have to be discharged off hydrochlorothiazide.  Chronic diastolic heart failure: Antihypertensive medications were held on admission, resume ARB.  She will probably have to go home off hydrochlorothiazide.  Normocytic anemia: Hemoglobin at baseline noted.  Vascular dementia: Continue Aricept.  Diabetes mellitus type 2: With an A1c of 5.9 continue sliding scale insulin well controlled.  CAD: 2D echo showed no wall motion  abnormalities she is currently on aspirin and statins. No events on telemetry.  DVT prophylaxis: lovenox Family Communication:none Status is: Inpatient Remains inpatient appropriate because: Acute kidney injury    Code Status:     Code Status Orders  (From admission, onward)           Start     Ordered   03/22/22 1329  Full code  Continuous        03/22/22 1332           Code Status History     Date Active Date Inactive Code Status Order ID Comments User Context   05/25/2016 1613 05/27/2016 1614 Full Code [id number]  Fanny Skates, MD Inpatient         IV Access:   Peripheral IV   Procedures and diagnostic studies:   No results found.   Medical Consultants:   None.   Subjective:    Jennifer Garrett no complaints today.  Objective:    Vitals:   03/27/22 2014 03/28/22 0027 03/28/22 0326 03/28/22 0829  BP: (!) 131/56 (!) 126/93 (!) 116/58 (!) 141/60  Pulse: 60 64 66 (!) 57  Resp: '18 18 15 18  '$ Temp: 98 F (36.7 C) 98 F (36.7 C) 98.5 F (36.9 C) 98.7 F (37.1 C)  TempSrc: Oral  Oral Oral  SpO2: 100% 100% 100% 100%  Weight:       SpO2: 100 %  No intake or output data in the 24 hours ending 03/28/22 0937  Filed Weights   03/24/22 0500 03/25/22 0410 03/26/22 0500  Weight: 58.8 kg 58.3 kg 58.7 kg    Exam: General exam: In no acute distress. Respiratory system: Good air movement and clear to auscultation. Cardiovascular system:  S1 & S2 heard, RRR. No JVD. Gastrointestinal system: Abdomen is nondistended, soft and nontender.  Extremities: No pedal edema. Skin: No rashes, lesions or ulcers  Data Reviewed:    Labs: Basic Metabolic Panel: Recent Labs  Lab 03/22/22 1116 03/22/22 1142 03/22/22 1533 03/23/22 0425 03/24/22 0531 03/25/22 0927 03/26/22 0409  NA 138 136  --  137 140 141 136  K 5.1 4.9  --  4.3 4.2 3.9 3.8  CL 106 109  --  109 113* 113* 110  CO2 22  --   --  21* 21* 20* 19*  GLUCOSE 120* 112*  --  90 75 73 83  BUN  26* 26*  --  26* '20 12 14  '$ CREATININE 1.67* 1.70*  --  1.53* 1.36* 1.04* 1.18*  CALCIUM 9.4  --   --  8.8* 8.9 8.9 8.5*  MG  --   --  2.0  --   --   --   --     GFR CrCl cannot be calculated (Unknown ideal weight.). Liver Function Tests: Recent Labs  Lab 03/22/22 1116 03/24/22 0531  AST 19 24  ALT 8 9  ALKPHOS 69 61  BILITOT 0.4 0.3  PROT 6.5 5.9*  ALBUMIN 3.2* 2.9*    No results for input(s): "LIPASE", "AMYLASE" in the last 168 hours. No results for input(s): "AMMONIA" in the last 168 hours. Coagulation profile Recent Labs  Lab 03/22/22 1533  INR 1.1    COVID-19 Labs  No results for input(s): "DDIMER", "FERRITIN", "LDH", "CRP" in the last 72 hours.  Lab Results  Component Value Date   [place] NEGATIVE 09/28/2019    CBC: Recent Labs  Lab 03/22/22 1116 03/22/22 1142 03/23/22 0425 03/24/22 0531  WBC 5.8  --  6.1 4.9  NEUTROABS 3.9  --   --   --   HGB 10.2* 10.9* 9.6* 9.6*  HCT 33.2* 32.0* 30.5* 29.3*  MCV 91.7  --  89.4 89.1  PLT 235  --  230 207    Cardiac Enzymes: Recent Labs  Lab 03/22/22 1533  CKTOTAL 64    BNP (last 3 results) No results for input(s): "PROBNP" in the last 8760 hours. CBG: Recent Labs  Lab 03/27/22 0619 03/27/22 1123 03/27/22 2155 03/28/22 0633 03/28/22 0701  GLUCAP 92 110* 79 67* 75    D-Dimer: No results for input(s): "DDIMER" in the last 72 hours. Hgb A1c: No results for input(s): "HGBA1C" in the last 72 hours.  Lipid Profile: No results for input(s): "CHOL", "HDL", "LDLCALC", "TRIG", "CHOLHDL", "LDLDIRECT" in the last 72 hours. Thyroid function studies: No results for input(s): "TSH", "T4TOTAL", "T3FREE", "THYROIDAB" in the last 72 hours.  Invalid input(s): "FREET3"  Anemia work up: No results for input(s): "VITAMINB12", "FOLATE", "FERRITIN", "TIBC", "IRON", "RETICCTPCT" in the last 72 hours.  Sepsis Labs: Recent Labs  Lab 03/22/22 1116 03/23/22 0425 03/24/22 0531  WBC 5.8 6.1 4.9     Microbiology No results found for this or any previous visit (from the past 240 hour(s)).   Medications:    aspirin  81 mg Oral Daily   donepezil  5 mg Oral Daily   enoxaparin (LOVENOX) injection  30 mg Subcutaneous Q24H   insulin aspart  0-9 Units Subcutaneous TID WC   irbesartan  37.5 mg Oral Daily   melatonin  3 mg Oral QHS   simvastatin  20 mg Oral Daily   Continuous Infusions:     LOS: 5 days   Charlynne Cousins  [facility]  03/28/2022, 9:37 AM

## 2022-03-28 NOTE — Progress Notes (Signed)
Pt refuses BG, this nurse and the NT both attempted to obtain a BG but patient continuously pulls away and balls fists.

## 2022-03-29 DIAGNOSIS — N184 Chronic kidney disease, stage 4 (severe): Secondary | ICD-10-CM | POA: Diagnosis not present

## 2022-03-29 DIAGNOSIS — R001 Bradycardia, unspecified: Secondary | ICD-10-CM | POA: Diagnosis not present

## 2022-03-29 DIAGNOSIS — R55 Syncope and collapse: Secondary | ICD-10-CM | POA: Diagnosis not present

## 2022-03-29 LAB — GLUCOSE, CAPILLARY
Glucose-Capillary: 81 mg/dL (ref 70–99)
Glucose-Capillary: 152 mg/dL — ABNORMAL HIGH (ref 70–99)

## 2022-03-29 NOTE — Care Management Important Message (Signed)
Important Message  Patient Details  Name: Jennifer Garrett MRN: [id number] Date of Birth: [birth year]   Medicare Important Message Given:  Yes     Orbie Pyo 03/29/2022, 3:21 PM

## 2022-03-29 NOTE — Discharge Summary (Addendum)
Physician Discharge Summary  Jennifer Garrett [facility]:[id number] DOB: 09-11-1931 DOA: 03/22/2022  PCP: Nolene Ebbs, MD  Admit date: 03/22/2022 Discharge date: 03/29/2022  Admitted From: Memory care unit  Disposition: Memory care unit   Recommendations for Outpatient Follow-up:  Follow up with PCP in 1-2 weeks Please obtain BMP/CBC in one week   Home Health:No Equipment/Devices:None  Discharge Condition:Stable CODE STATUS:Full Diet recommendation: Heart Healthy  Brief/Interim Summary: 88 y.o. female past medical history of breast cancer status postmastectomy, CAD, diabetes mellitus type 2, vascular dementia comes in with an unwitnessed fall at skilled nursing facility and seizure-like activity.  EMS got there heart rate was 46 she was given atropine.    Discharge Diagnoses:  Principal Problem:   Syncope and collapse with seizure like activity and bradycardia Active Problems:   Acute renal failure superimposed on stage 3b chronic kidney disease ([place])   Essential hypertension   Dyslipidemia   Coronary artery disease involving native coronary artery of native heart without angina pectoris   Type 2 diabetes mellitus with complication, without long-term current use of insulin (HCC)   Vascular dementia (HCC)   Seizure-like activity (HCC)   Normocytic anemia  Syncope and collapse with seizure-like activity: EEG showed slowing suggestive of diffuse encephalopathy no seizures. She was mildly bradycardic.  She had no events on telemetry and no seizures. 2D echo showed an EF of 60% no aortic stenosis grade 2 diastolic heart failure. She was fluid resuscitated as it was thought she was mildly hypovolemic and her bradycardia resolved. Physical therapy evaluated the patient and agree with the patient going back to memory care unit.  Acute kidney injury on chronic kidney stage IIIb: With a baseline creatinine 1.2 on admission 1.7 she was fluid resuscitated and her creatinine returned to  baseline.  Acute confusional state/acute metabolic encephalopathy: She was ordered melatonin at night and remained relatively stable.  Central hypertension: Blood pressure on admission was soft, antihypertensive medications were held. Hydrochlorothiazide I was discontinued, she was started on her ARB which she tolerated.  Chronic diastolic heart failure: No changes made to her medication, her diuretic therapy was stopped.  Normocytic anemia: Her hemoglobin remains at baseline.  Vascular dementia: Continue Aricept.  Diabetes mellitus type 2: With an A1c of 5.9 no changes made to her medication.  CAD: No events on telemetry stable.  Discharge Instructions  Discharge Instructions     Diet - low sodium heart healthy   Complete by: As directed    Increase activity slowly   Complete by: As directed       Allergies as of 03/29/2022       Reactions   Lyrica [pregabalin] Swelling   Feldene [piroxicam] Rash   Orudis [ketoprofen] Rash   Vibramycin [doxycycline Calcium] Rash, Hives   Zestril [lisinopril] Other (See Comments)   Light sensitivity         Medication List     STOP taking these medications    ferrous sulfate 325 (65 FE) MG tablet   hydrochlorothiazide 12.5 MG tablet Commonly known as: HYDRODIURIL       TAKE these medications    aspirin 81 MG chewable tablet Chew 81 mg by mouth daily.   CALCIUM 600+D3 PO Take 1 tablet by mouth daily.   donepezil 5 MG tablet Commonly known as: ARICEPT Take 5 mg by mouth daily.   olmesartan 40 MG tablet Commonly known as: BENICAR Take 40 mg by mouth daily.   potassium chloride SA 20 MEQ tablet Commonly known as: Rhetta Mura  Take 20 mEq by mouth daily.   simvastatin 20 MG tablet Commonly known as: ZOCOR Take 20 mg by mouth daily.        Allergies  Allergen Reactions   Lyrica [Pregabalin] Swelling   Feldene [Piroxicam] Rash   Orudis [Ketoprofen] Rash   Vibramycin [Doxycycline Calcium] Rash and  Hives   Zestril [Lisinopril] Other (See Comments)    Light sensitivity     Consultations: None   Procedures/Studies: ECHOCARDIOGRAM COMPLETE  Result Date: 03/23/2022    ECHOCARDIOGRAM REPORT   Patient Name:   Jennifer Garrett Date of Exam: 03/23/2022 Medical Rec #:  [id number]     Height:       65.0 in Accession #:    [id number]    Weight:       165.0 lb Date of Birth:  1932-06-03     BSA:          1.823 m Patient Age:    88 years      BP:           121/73 mmHg Patient Gender: F             HR:           56 bpm. Exam Location:  Inpatient Procedure: 2D Echo, Cardiac Doppler and Color Doppler Indications:    Syncope  History:        Patient has no prior history of Echocardiogram examinations.                 CAD; Risk Factors:Hypertension, Dyslipidemia and Diabetes.  Sonographer:    Jefferey Pica Referring Phys: [id number] Orma Flaming  Sonographer Comments: Techincally difficult study due to patients constant movement. IMPRESSIONS  1. Left ventricular ejection fraction, by estimation, is 60 to 65%. The left ventricle has normal function. The left ventricle has no regional wall motion abnormalities. Left ventricular diastolic parameters are consistent with Grade II diastolic dysfunction (pseudonormalization). Elevated left atrial pressure.  2. Right ventricular systolic function is normal. The right ventricular size is normal. There is mildly elevated pulmonary artery systolic pressure. The estimated right ventricular systolic pressure is 25.0 mmHg.  3. Left atrial size was mild to moderately dilated.  4. The mitral valve is degenerative. Mild mitral valve regurgitation. No evidence of mitral stenosis. Moderate mitral annular calcification.  5. Tricuspid valve regurgitation is mild to moderate.  6. The aortic valve was not well visualized. There is moderate calcification of the aortic valve. There is moderate thickening of the aortic valve. Aortic valve regurgitation is trivial. Moderate aortic valve stenosis.   7. The inferior vena cava is normal in size with <50% respiratory variability, suggesting right atrial pressure of 8 mmHg. FINDINGS  Left Ventricle: Left ventricular ejection fraction, by estimation, is 60 to 65%. The left ventricle has normal function. The left ventricle has no regional wall motion abnormalities. The left ventricular internal cavity size was normal in size. There is  no left ventricular hypertrophy. Left ventricular diastolic parameters are consistent with Grade II diastolic dysfunction (pseudonormalization). Elevated left atrial pressure. Right Ventricle: The right ventricular size is normal. No increase in right ventricular wall thickness. Right ventricular systolic function is normal. There is mildly elevated pulmonary artery systolic pressure. The tricuspid regurgitant velocity is 2.64  m/s, and with an assumed right atrial pressure of 8 mmHg, the estimated right ventricular systolic pressure is 53.9 mmHg. Left Atrium: Left atrial size was mild to moderately dilated. Right Atrium: Right atrial size was normal in size. Prominent Eustachian  valve. Pericardium: There is no evidence of pericardial effusion. Mitral Valve: The mitral valve is degenerative in appearance. There is mild thickening of the mitral valve leaflet(s). Moderate mitral annular calcification. Mild mitral valve regurgitation, with centrally-directed jet. No evidence of mitral valve stenosis. Tricuspid Valve: The tricuspid valve is normal in structure. Tricuspid valve regurgitation is mild to moderate. Aortic Valve: The aortic valve was not well visualized. There is moderate calcification of the aortic valve. There is moderate thickening of the aortic valve. Aortic valve regurgitation is trivial. Aortic regurgitation PHT measures 469 msec. Moderate aortic stenosis is present. Aortic valve mean gradient measures 19.5 mmHg. Aortic valve peak gradient measures 34.3 mmHg. Aortic valve area, by VTI measures 1.10 cm. Pulmonic Valve:  The pulmonic valve was not well visualized. Aorta: The aortic root is normal in size and structure. Venous: The inferior vena cava is normal in size with less than 50% respiratory variability, suggesting right atrial pressure of 8 mmHg. IAS/Shunts: No atrial level shunt detected by color flow Doppler.  LEFT VENTRICLE PLAX 2D LVIDd:         4.40 cm   Diastology LVIDs:         2.00 cm   LV e' medial:    5.15 cm/s LV PW:         1.00 cm   LV E/e' medial:  24.4 LV IVS:        0.90 cm   LV e' lateral:   6.53 cm/s LVOT diam:     2.00 cm   LV E/e' lateral: 19.3 LV SV:         80 LV SV Index:   44 LVOT Area:     3.14 cm  RIGHT VENTRICLE          IVC RV Basal diam:  3.10 cm  IVC diam: 1.60 cm TAPSE (M-mode): 2.2 cm LEFT ATRIUM             Index        RIGHT ATRIUM           Index LA diam:        4.40 cm 2.41 cm/m   RA Area:     11.10 cm LA Vol (A2C):   81.8 ml 44.87 ml/m  RA Volume:   26.10 ml  14.32 ml/m LA Vol (A4C):   52.6 ml 28.86 ml/m LA Biplane Vol: 67.6 ml 37.08 ml/m  AORTIC VALVE                     PULMONIC VALVE AV Area (Vmax):    1.22 cm      PV Vmax:       0.82 m/s AV Area (Vmean):   1.10 cm      PV Peak grad:  2.7 mmHg AV Area (VTI):     1.10 cm AV Vmax:           293.00 cm/s AV Vmean:          208.500 cm/s AV VTI:            0.729 m AV Peak Grad:      34.3 mmHg AV Mean Grad:      19.5 mmHg LVOT Vmax:         114.00 cm/s LVOT Vmean:        72.900 cm/s LVOT VTI:          0.256 m LVOT/AV VTI ratio: 0.35 AI PHT:  469 msec MITRAL VALVE                TRICUSPID VALVE MV Area (PHT): 3.87 cm     TR Peak grad:   27.9 mmHg MV Decel Time: 196 msec     TR Vmax:        264.00 cm/s MV E velocity: 125.83 cm/s MV A velocity: 127.19 cm/s  SHUNTS MV E/A ratio:  0.99         Systemic VTI:  0.26 m                             Systemic Diam: 2.00 cm Sanda Klein MD Electronically signed by Sanda Klein MD Signature Date/Time: 03/23/2022/9:25:15 AM    Final    EEG adult  Result Date: 03/23/2022 Lora Havens, MD     03/23/2022  9:16 AM Patient Name: REYANA LEISEY MRN: [id number] Epilepsy Attending: Lora Havens Referring Physician/Provider: Orma Flaming, MD Date: 03/22/2022 Duration: 21.14 mins Patient history: 88 yo F with syncope and seizure like activity. EEG to evaluate for seizure Level of alertness: Awake AEDs during EEG study: None Technical aspects: This EEG study was done with scalp electrodes positioned according to the 10-20 International system of electrode placement. Electrical activity was reviewed with band pass filter of 1-'70Hz'$ , sensitivity of 7 uV/mm, display speed of 34m/sec with a '60Hz'$  notched filter applied as appropriate. EEG data were recorded continuously and digitally stored.  Video monitoring was available and reviewed as appropriate. Description: The posterior dominant rhythm consists of 7 Hz activity of moderate voltage (25-35 uV) seen predominantly in posterior head regions, symmetric and reactive to eye opening and eye closing. EEG showed continuous generalized 5 to 6 Hz theta slowing. Hyperventilation and photic stimulation were not performed.   ABNORMALITY - Continuous slow, generalized - Background slow IMPRESSION: This study is suggestive of mild to moderate diffuse encephalopathy, nonspecific etiology. No seizures or epileptiform discharges were seen throughout the recording. PLora Havens  DG Lumbar Spine 2-3 Views  Result Date: 03/22/2022 CLINICAL DATA:  Trauma, fall, pain EXAM: LUMBAR SPINE - 2-3 VIEW COMPARISON:  None Available. FINDINGS: No recent fracture is seen. There is no significant disc space narrowing. There is first-degree anterolisthesis at L3-L4 level. Degenerative changes are noted in facet joints, mostly in the lower lumbar spine. Arterial calcifications are seen in soft tissues. IMPRESSION: No recent fracture is seen in the lumbar spine. There is first-degree anterolisthesis at L3-L4 level. Lumbar spondylosis. Electronically Signed   By: PElmer PickerM.D.   On: 03/22/2022 12:18   DG Chest 1 View  Result Date: 03/22/2022 CLINICAL DATA:  Back pain, fall EXAM: CHEST  1 VIEW COMPARISON:  10/06/2019 FINDINGS: Transverse diameter of heart is increased. Thoracic aorta is tortuous. Lung fields are clear of any infiltrate or pulmonary edema. There is no pleural effusion or pneumothorax. Surgical clips are seen in chest wall. Old healed fracture is seen in the posterolateral aspect of left sixth rib. IMPRESSION: Cardiomegaly. There are no signs of pulmonary edema or focal pulmonary consolidation. Electronically Signed   By: PElmer PickerM.D.   On: 03/22/2022 12:16   DG Thoracic Spine 2 View  Result Date: 03/22/2022 CLINICAL DATA:  Seizures.  Fall with back pain. EXAM: THORACIC SPINE 2 VIEWS COMPARISON:  None Available. FINDINGS: No evidence for an acute fracture. No subluxation. Intervertebral disc spaces are preserved. Of note, T1 and T2 have  not been well visualized on lateral imaging. No abnormal paraspinal line to suggest paraspinal hematoma. Bones are diffusely demineralized. IMPRESSION: 1. No acute bony abnormality. 2. Of note, T1 and T2 have not been well visualized on lateral imaging. If there is high clinical index of concern for injury at the T1-2 level, CT imaging recommended to further evaluate. Electronically Signed   By: Misty Stanley M.D.   On: 03/22/2022 12:15   CT HEAD WO CONTRAST  Result Date: 03/22/2022 CLINICAL DATA:  Neuro deficit, acute, stroke suspected; Neck trauma (Age >= 65y). Un witnessed fall. Possible seizure activity. EXAM: CT HEAD WITHOUT CONTRAST CT CERVICAL SPINE WITHOUT CONTRAST TECHNIQUE: Multidetector CT imaging of the head and cervical spine was performed following the standard protocol without intravenous contrast. Multiplanar CT image reconstructions of the cervical spine were also generated. RADIATION DOSE REDUCTION: This exam was performed according to the departmental dose-optimization program which  includes automated exposure control, adjustment of the mA and/or kV according to patient size and/or use of iterative reconstruction technique. COMPARISON:  CT head and cervical spine 10/04/2020 FINDINGS: CT HEAD FINDINGS Brain: There is no evidence of an acute infarct, intracranial hemorrhage, mass, midline shift, or extra-axial fluid collection. Mild-to-moderate cerebral atrophy appears greatest in the parieto-occipital regions. Hypodensities in the cerebral white matter bilaterally are unchanged and nonspecific but compatible with moderate chronic small vessel ischemic disease. Lacunar infarcts in the thalami are more conspicuous today but were likely present on the prior study and are therefore favored to be chronic. Vascular: Calcified atherosclerosis at the skull base. No hyperdense vessel. Skull: No fracture or suspicious osseous lesion. Sinuses/Orbits: Visualized paranasal sinuses and mastoid air cells are clear. Bilateral cataract extraction. Other: None. CT CERVICAL SPINE FINDINGS Alignment: Normal. Skull base and vertebrae: No acute fracture or suspicious osseous lesion. Soft tissues and spinal canal: No prevertebral fluid or swelling. No visible canal hematoma. Disc levels: Similar appearance of multilevel disc degeneration, greatest at C5-6 where there is likely mild spinal stenosis and mild left neural foraminal stenosis. Moderate multilevel facet arthrosis. Upper chest: Mild centrilobular emphysema in the lung apices. IMPRESSION: 1. No evidence of acute intracranial abnormality. 2. Moderate chronic small vessel ischemic disease. 3. No acute cervical spine fracture. Electronically Signed   By: Logan Bores M.D.   On: 03/22/2022 12:04   CT CERVICAL SPINE WO CONTRAST  Result Date: 03/22/2022 CLINICAL DATA:  Neuro deficit, acute, stroke suspected; Neck trauma (Age >= 65y). Un witnessed fall. Possible seizure activity. EXAM: CT HEAD WITHOUT CONTRAST CT CERVICAL SPINE WITHOUT CONTRAST TECHNIQUE:  Multidetector CT imaging of the head and cervical spine was performed following the standard protocol without intravenous contrast. Multiplanar CT image reconstructions of the cervical spine were also generated. RADIATION DOSE REDUCTION: This exam was performed according to the departmental dose-optimization program which includes automated exposure control, adjustment of the mA and/or kV according to patient size and/or use of iterative reconstruction technique. COMPARISON:  CT head and cervical spine 10/04/2020 FINDINGS: CT HEAD FINDINGS Brain: There is no evidence of an acute infarct, intracranial hemorrhage, mass, midline shift, or extra-axial fluid collection. Mild-to-moderate cerebral atrophy appears greatest in the parieto-occipital regions. Hypodensities in the cerebral white matter bilaterally are unchanged and nonspecific but compatible with moderate chronic small vessel ischemic disease. Lacunar infarcts in the thalami are more conspicuous today but were likely present on the prior study and are therefore favored to be chronic. Vascular: Calcified atherosclerosis at the skull base. No hyperdense vessel. Skull: No fracture or suspicious osseous lesion.  Sinuses/Orbits: Visualized paranasal sinuses and mastoid air cells are clear. Bilateral cataract extraction. Other: None. CT CERVICAL SPINE FINDINGS Alignment: Normal. Skull base and vertebrae: No acute fracture or suspicious osseous lesion. Soft tissues and spinal canal: No prevertebral fluid or swelling. No visible canal hematoma. Disc levels: Similar appearance of multilevel disc degeneration, greatest at C5-6 where there is likely mild spinal stenosis and mild left neural foraminal stenosis. Moderate multilevel facet arthrosis. Upper chest: Mild centrilobular emphysema in the lung apices. IMPRESSION: 1. No evidence of acute intracranial abnormality. 2. Moderate chronic small vessel ischemic disease. 3. No acute cervical spine fracture. Electronically  Signed   By: Logan Bores M.D.   On: 03/22/2022 12:04   (Echo, Carotid, EGD, Colonoscopy, ERCP)    Subjective: No complaints  Discharge Exam: Vitals:   03/29/22 0421 03/29/22 0732  BP: (!) 95/51 (!) 137/41  Pulse: 65 (!) 54  Resp: 16 16  Temp: 98 F (36.7 C) 98.5 F (36.9 C)  SpO2: 98% 100%   Vitals:   03/28/22 1712 03/28/22 2032 03/29/22 0421 03/29/22 0732  BP: (!) 152/56 (!) 134/53 (!) 95/51 (!) 137/41  Pulse: (!) 59 68 65 (!) 54  Resp: '18 16 16 16  '$ Temp: 97.8 F (36.6 C) 98.7 F (37.1 C) 98 F (36.7 C) 98.5 F (36.9 C)  TempSrc: Oral Oral Oral Oral  SpO2: 100% 100% 98% 100%  Weight:        General: Pt is alert, awake, not in acute distress Cardiovascular: RRR, S1/S2 +, no rubs, no gallops Respiratory: CTA bilaterally, no wheezing, no rhonchi Abdominal: Soft, NT, ND, bowel sounds + Extremities: no edema, no cyanosis    The results of significant diagnostics from this hospitalization (including imaging, microbiology, ancillary and laboratory) are listed below for reference.     Microbiology: No results found for this or any previous visit (from the past 240 hour(s)).   Labs: BNP (last 3 results) No results for input(s): "BNP" in the last 8760 hours. Basic Metabolic Panel: Recent Labs  Lab 03/22/22 1142 03/22/22 1533 03/23/22 0425 03/24/22 0531 03/25/22 0927 03/26/22 0409  NA 136  --  137 140 141 136  K 4.9  --  4.3 4.2 3.9 3.8  CL 109  --  109 113* 113* 110  CO2  --   --  21* 21* 20* 19*  GLUCOSE 112*  --  90 75 73 83  BUN 26*  --  26* '20 12 14  '$ CREATININE 1.70*  --  1.53* 1.36* 1.04* 1.18*  CALCIUM  --   --  8.8* 8.9 8.9 8.5*  MG  --  2.0  --   --   --   --    Liver Function Tests: Recent Labs  Lab 03/24/22 0531  AST 24  ALT 9  ALKPHOS 61  BILITOT 0.3  PROT 5.9*  ALBUMIN 2.9*   No results for input(s): "LIPASE", "AMYLASE" in the last 168 hours. No results for input(s): "AMMONIA" in the last 168 hours. CBC: Recent Labs  Lab  03/22/22 1142 03/23/22 0425 03/24/22 0531  WBC  --  6.1 4.9  HGB 10.9* 9.6* 9.6*  HCT 32.0* 30.5* 29.3*  MCV  --  89.4 89.1  PLT  --  230 207   Cardiac Enzymes: Recent Labs  Lab 03/22/22 1533  CKTOTAL 64   BNP: Invalid input(s): "POCBNP" CBG: Recent Labs  Lab 03/28/22 1245 03/28/22 1711 03/28/22 1730 03/28/22 1839 03/29/22 0622  GLUCAP 79 69* 62* 104* 81  D-Dimer No results for input(s): "DDIMER" in the last 72 hours. Hgb A1c No results for input(s): "HGBA1C" in the last 72 hours. Lipid Profile No results for input(s): "CHOL", "HDL", "LDLCALC", "TRIG", "CHOLHDL", "LDLDIRECT" in the last 72 hours. Thyroid function studies No results for input(s): "TSH", "T4TOTAL", "T3FREE", "THYROIDAB" in the last 72 hours.  Invalid input(s): "FREET3" Anemia work up No results for input(s): "VITAMINB12", "FOLATE", "FERRITIN", "TIBC", "IRON", "RETICCTPCT" in the last 72 hours. Urinalysis    Component Value Date/Time   COLORURINE YELLOW 05/19/2016 1138   APPEARANCEUR CLEAR 05/19/2016 1138   LABSPEC 1.017 05/19/2016 1138   PHURINE 5.5 05/19/2016 1138   GLUCOSEU NEGATIVE 05/19/2016 1138   HGBUR NEGATIVE 05/19/2016 1138   BILIRUBINUR NEGATIVE 05/19/2016 1138   KETONESUR NEGATIVE 05/19/2016 1138   PROTEINUR NEGATIVE 05/19/2016 1138   UROBILINOGEN 1.0 11/28/2013 0143   NITRITE NEGATIVE 05/19/2016 1138   LEUKOCYTESUR NEGATIVE 05/19/2016 1138   Sepsis Labs Recent Labs  Lab 03/23/22 0425 03/24/22 0531  WBC 6.1 4.9   Microbiology No results found for this or any previous visit (from the past 240 hour(s)).    SIGNED:   Charlynne Cousins, MD  [facility] 03/29/2022, 11:21 AM Pager   If 7PM-7AM, please contact night-coverage [email or web address] Password [email or web address]

## 2022-03-29 NOTE — Progress Notes (Signed)
Report called to [facility] at nursing facility. AVS printed and given to patient's daughter. Patient transported to daughter's car via wheelchair.

## 2022-03-29 NOTE — NC FL2 (Addendum)
[place] LEVEL OF CARE SCREENING TOOL     IDENTIFICATION  Patient Name: Jennifer Garrett Birthdate: 1931-10-04 Sex: female Admission Date (Current Location): 03/22/2022  [facility] and [place] Number:  Herbalist and Address:  [facility], [place] [address], [place], [place] [address]      Provider Number: [id number]  Attending Physician Name and Address:  Aileen Fass, Tammi Klippel, MD  Relative Name and Phone Number:       Current Level of Care: Hospital Recommended Level of Care: Memory Care Prior Approval Number:    Date Approved/Denied:   PASRR Number:    Discharge Plan: Other (Comment) (memory care)    Current Diagnoses: Patient Active Problem List   Diagnosis Date Noted   Type 2 diabetes mellitus with complication, without long-term current use of insulin ([place]) 03/22/2022   Vascular dementia ([place]) 03/22/2022   Seizure-like activity ([place]) 03/22/2022   Syncope and collapse with seizure like activity and bradycardia 03/22/2022   Acute renal failure superimposed on stage 3b chronic kidney disease ([place]) 03/22/2022   Normocytic anemia 03/22/2022   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[place]) 05/12/2017   Preop cardiovascular exam 03/18/2017   Essential hypertension 03/18/2017   Dyslipidemia 03/18/2017   Coronary artery disease involving native coronary artery of native heart without angina pectoris 03/18/2017   Bruit 03/18/2017   Lung metastases 06/23/2016   Malignant neoplasm of lower-inner quadrant of right breast of female, estrogen receptor negative ([place]) 01/22/2016    Orientation RESPIRATION BLADDER Height & Weight     Self  Normal Incontinent, Continent (incontinent at times) Weight: 129 lb 6.6 oz (58.7 kg) Height:     BEHAVIORAL SYMPTOMS/MOOD NEUROLOGICAL BOWEL NUTRITION STATUS      Continent Diet (no added table salt)  AMBULATORY STATUS COMMUNICATION OF NEEDS Skin   Limited Assist Verbally Normal                        Personal Care Assistance Level of Assistance  Bathing, Feeding, Dressing Bathing Assistance: Limited assistance Feeding assistance: Limited assistance Dressing Assistance: Limited assistance     Functional Limitations Info  Sight Sight Info: Impaired        SPECIAL CARE FACTORS FREQUENCY                       Contractures Contractures Info: Not present    Additional Factors Info  Code Status, Allergies Code Status Info: Full Allergies Info: Lyrica (Pregabalin), Feldene (Piroxicam), Orudis (Ketoprofen), Vibramycin (Doxycycline Calcium), Zestril (Lisinopril)            Discharge Medications: STOP taking these medications     ferrous sulfate 325 (65 FE) MG tablet    hydrochlorothiazide 12.5 MG tablet Commonly known as: HYDRODIURIL           TAKE these medications     aspirin 81 MG chewable tablet Chew 81 mg by mouth daily.    CALCIUM 600+D3 PO Take 1 tablet by mouth daily.    donepezil 5 MG tablet Commonly known as: ARICEPT Take 5 mg by mouth daily.    olmesartan 40 MG tablet Commonly known as: BENICAR Take 40 mg by mouth daily.    potassium chloride SA 20 MEQ tablet Commonly known as: KLOR-CON M Take 20 mEq by mouth daily.    simvastatin 20 MG tablet Commonly known as: ZOCOR Take 20 mg by mouth daily.    Relevant Imaging Results:  Relevant  Lab Results:   Additional [place], LCSW

## 2022-03-29 NOTE — TOC Transition Note (Signed)
Transition of Care [facility]) - CM/SW Discharge Note   Patient Details  Name: Jennifer Garrett MRN: [id number] Date of Birth: 11/15/1931  Transition of Care [facility]) CM/SW Contact:  Geralynn Ochs, LCSW Phone Number: 03/29/2022, 9:43 AM   Clinical Narrative:   CSW noting per chart review that patient discharged. CSW spoke with daughter to confirm return to memory care today, daughter to provide transportation after work around 3/3:30 this afternoon. CSW spoke with [facility], sent discharge information. CSW updated RN on transfer to memory care.   Nurse to call report to [phone].    Final next level of care: Memory Care Barriers to Discharge: Barriers Resolved   Patient Goals and CMS Choice Patient states their goals for this hospitalization and ongoing recovery are:: patient unable to participate in goal setting, not oriented CMS Medicare.gov Compare Post Acute Care list provided to:: Patient Represenative (must comment) Choice offered to / list presented to : Adult Children  Discharge Placement              Patient chooses bed at: [facility] Patient to be transferred to facility by: Family Name of family member notified: [name] Patient and family notified of of transfer: 03/29/22  Discharge Plan and Services     Post Acute Care Choice: Nursing Home                               Social Determinants of Health (SDOH) Interventions     Readmission Risk Interventions     No data to display

## 2022-08-03 ENCOUNTER — Emergency Department (HOSPITAL_COMMUNITY)
Admission: EM | Admit: 2022-08-03 | Discharge: 2022-08-03 | Disposition: A | Discharge status: Skilled Nursing Facility | Payer: Medicare Other | Attending: Emergency Medicine | Admitting: Emergency Medicine

## 2022-08-03 ENCOUNTER — Emergency Department (HOSPITAL_COMMUNITY): Payer: Medicare Other

## 2022-08-03 DIAGNOSIS — Z7982 Long term (current) use of aspirin: Secondary | ICD-10-CM | POA: Insufficient documentation

## 2022-08-03 DIAGNOSIS — S0003XA Contusion of scalp, initial encounter: Secondary | ICD-10-CM | POA: Diagnosis not present

## 2022-08-03 DIAGNOSIS — Y92129 Unspecified place in nursing home as the place of occurrence of the external cause: Secondary | ICD-10-CM | POA: Insufficient documentation

## 2022-08-03 DIAGNOSIS — W19XXXA Unspecified fall, initial encounter: Secondary | ICD-10-CM | POA: Diagnosis not present

## 2022-08-03 DIAGNOSIS — M25511 Pain in right shoulder: Secondary | ICD-10-CM | POA: Insufficient documentation

## 2022-08-03 DIAGNOSIS — F0390 Unspecified dementia without behavioral disturbance: Secondary | ICD-10-CM | POA: Insufficient documentation

## 2022-08-03 DIAGNOSIS — R410 Disorientation, unspecified: Secondary | ICD-10-CM | POA: Insufficient documentation

## 2022-08-03 DIAGNOSIS — S0990XA Unspecified injury of head, initial encounter: Secondary | ICD-10-CM | POA: Diagnosis present

## 2022-08-03 DIAGNOSIS — M545 Low back pain, unspecified: Secondary | ICD-10-CM | POA: Diagnosis not present

## 2022-08-03 NOTE — ED Notes (Signed)
Attempted to call nursing home twice no answer

## 2022-08-03 NOTE — ED Triage Notes (Signed)
BIB EMS from [facility] for an unwitnessed fall, laceration to back of head, also c/o right shoulder pain. Pt has hx of dementia 162/88 BP 53 HR 22 RR 99% r/a

## 2022-08-03 NOTE — ED Notes (Signed)
Pt is uncooperative, and unable to redirect. Pt has ripped off all her vitals and is agitated at this time.

## 2022-08-03 NOTE — ED Provider Notes (Signed)
[facility] DEPT Provider Note   CSN: [id number] Arrival date & time: 08/03/22  2043     History  No chief complaint on file.   Jennifer Garrett is a 89 y.o. female.  89 year old female presents after unwitnessed fall at the nursing home.  Patient has a history of dementia.  Complains of pain to her right shoulder as well as her occiput.  Was noted to have a hematoma to her occipital region.  Patient's mental status per EMS is at her baseline.  Transported here for further evaluation       Home Medications Prior to Admission medications   Medication Sig Start Date End Date Taking? Authorizing Provider  aspirin 81 MG chewable tablet Chew 81 mg by mouth daily.    [provider]  Calcium Carb-Cholecalciferol (CALCIUM 600+D3 PO) Take 1 tablet by mouth daily.    [provider]  donepezil (ARICEPT) 5 MG tablet Take 5 mg by mouth daily. 03/14/16   [provider]  olmesartan (BENICAR) 40 MG tablet Take 40 mg by mouth daily.    [provider]  potassium chloride SA (KLOR-CON) 20 MEQ tablet Take 20 mEq by mouth daily.    [provider]  simvastatin (ZOCOR) 20 MG tablet Take 20 mg by mouth daily.    [provider]      Allergies    Lyrica [pregabalin], Feldene [piroxicam], Orudis [ketoprofen], Vibramycin [doxycycline calcium], and Zestril [lisinopril]    Review of Systems   Review of Systems  All other systems reviewed and are negative.   Physical Exam Updated Vital Signs There were no vitals taken for this visit. Physical Exam Vitals and nursing note reviewed.  Constitutional:      General: She is not in acute distress.    Appearance: Normal appearance. She is well-developed. She is not toxic-appearing.  HENT:     Head:     Comments: Occipital hematoma noted with abrasion without evidence of laceration Eyes:     General: Lids are normal.     Conjunctiva/sclera: Conjunctivae normal.      Pupils: Pupils are equal, round, and reactive to light.  Neck:     Thyroid: No thyroid mass.     Trachea: No tracheal deviation.  Cardiovascular:     Rate and Rhythm: Normal rate and regular rhythm.     Heart sounds: Normal heart sounds. No murmur heard.    No gallop.  Pulmonary:     Effort: Pulmonary effort is normal. No respiratory distress.     Breath sounds: Normal breath sounds. No stridor. No decreased breath sounds, wheezing, rhonchi or rales.  Abdominal:     General: There is no distension.     Palpations: Abdomen is soft.     Tenderness: There is no abdominal tenderness. There is no rebound.  Musculoskeletal:        General: Normal range of motion.     Right shoulder: Tenderness and bony tenderness present. Normal range of motion.     Cervical back: Normal range of motion and neck supple. Muscular tenderness present. No pain with movement or spinous process tenderness.  Skin:    General: Skin is warm and dry.     Findings: No abrasion or rash.  Neurological:     General: No focal deficit present.     Mental Status: She is alert. Mental status is at baseline. She is disoriented.     GCS: GCS eye subscore is 4. GCS verbal subscore is  5. GCS motor subscore is 5.     Cranial Nerves: No cranial nerve deficit.     Sensory: No sensory deficit.     Motor: Motor function is intact.  Psychiatric:        Attention and Perception: Attention normal.        Speech: Speech normal.     ED Results / Procedures / Treatments   Labs (all labs ordered are listed, but only abnormal results are displayed) Labs Reviewed - No data to display  EKG None  Radiology No results found.  Procedures Procedures    Medications Ordered in ED Medications - No data to display  ED Course/ Medical Decision Making/ A&P                             Medical Decision Making Amount and/or Complexity of Data Reviewed Radiology: ordered.   CT of the head per my interpretation shows no acute  findings.  X-ray of right shoulder without acute findings.  CT cervical spine per interpretation negative as well to.  Patient stable for discharge        Final Clinical Impression(s) / ED Diagnoses Final diagnoses:  None    Rx / DC Orders ED Discharge Orders     None         Lacretia Leigh, MD 08/03/22 2238

## 2022-09-08 ENCOUNTER — Encounter (HOSPITAL_COMMUNITY): Payer: Self-pay | Admitting: Emergency Medicine

## 2022-09-08 ENCOUNTER — Emergency Department (HOSPITAL_COMMUNITY): Payer: Medicare Other

## 2022-09-08 ENCOUNTER — Emergency Department (HOSPITAL_COMMUNITY)
Admission: EM | Admit: 2022-09-08 | Discharge: 2022-09-08 | Disposition: A | Discharge status: Skilled Nursing Facility | Payer: Medicare Other | Attending: Emergency Medicine | Admitting: Emergency Medicine

## 2022-09-08 DIAGNOSIS — J45909 Unspecified asthma, uncomplicated: Secondary | ICD-10-CM | POA: Diagnosis not present

## 2022-09-08 DIAGNOSIS — F0390 Unspecified dementia without behavioral disturbance: Secondary | ICD-10-CM | POA: Insufficient documentation

## 2022-09-08 DIAGNOSIS — R944 Abnormal results of kidney function studies: Secondary | ICD-10-CM | POA: Diagnosis not present

## 2022-09-08 DIAGNOSIS — R0789 Other chest pain: Secondary | ICD-10-CM | POA: Diagnosis present

## 2022-09-08 DIAGNOSIS — Z853 Personal history of malignant neoplasm of breast: Secondary | ICD-10-CM | POA: Insufficient documentation

## 2022-09-08 DIAGNOSIS — I2510 Atherosclerotic heart disease of native coronary artery without angina pectoris: Secondary | ICD-10-CM | POA: Insufficient documentation

## 2022-09-08 DIAGNOSIS — E119 Type 2 diabetes mellitus without complications: Secondary | ICD-10-CM | POA: Diagnosis not present

## 2022-09-08 DIAGNOSIS — Z7982 Long term (current) use of aspirin: Secondary | ICD-10-CM | POA: Insufficient documentation

## 2022-09-08 DIAGNOSIS — I10 Essential (primary) hypertension: Secondary | ICD-10-CM | POA: Diagnosis not present

## 2022-09-08 DIAGNOSIS — R079 Chest pain, unspecified: Secondary | ICD-10-CM

## 2022-09-08 LAB — CBC
HCT: 29.3 % — ABNORMAL LOW (ref 36.0–46.0)
Hemoglobin: 9.3 g/dL — ABNORMAL LOW (ref 12.0–15.0)
MCH: 28.8 pg (ref 26.0–34.0)
MCHC: 31.7 g/dL (ref 30.0–36.0)
MCV: 90.7 fL (ref 80.0–100.0)
Platelets: 173 10*3/uL (ref 150–400)
RBC: 3.23 MIL/uL — ABNORMAL LOW (ref 3.87–5.11)
RDW: 12.6 % (ref 11.5–15.5)
WBC: 5 10*3/uL (ref 4.0–10.5)
nRBC: 0 % (ref 0.0–0.2)

## 2022-09-08 LAB — BASIC METABOLIC PANEL
Anion gap: 7 (ref 5–15)
BUN: 30 mg/dL — ABNORMAL HIGH (ref 8–23)
CO2: 23 mmol/L (ref 22–32)
Calcium: 9.1 mg/dL (ref 8.9–10.3)
Chloride: 107 mmol/L (ref 98–111)
Creatinine, Ser: 1.27 mg/dL — ABNORMAL HIGH (ref 0.44–1.00)
GFR, Estimated: 41 mL/min — ABNORMAL LOW (ref >60)
Glucose, Bld: 75 mg/dL (ref 70–99)
Potassium: 4.9 mmol/L (ref 3.5–5.1)
Sodium: 137 mmol/L (ref 135–145)

## 2022-09-08 LAB — TROPONIN I (HIGH SENSITIVITY)
Troponin I (High Sensitivity): 18 ng/L — ABNORMAL HIGH (ref <18)
Troponin I (High Sensitivity): 17 ng/L (ref <18)

## 2022-09-08 LAB — BRAIN NATRIURETIC PEPTIDE: B Natriuretic Peptide: 78.9 pg/mL (ref 0.0–100.0)

## 2022-09-08 NOTE — ED Provider Notes (Signed)
[facility] Provider Note   CSN: [id number] Arrival date & time: 09/08/22  0813     History  Chief Complaint  Patient presents with   Chest Pain   Level 5 Caveat: Dementia  Jennifer Garrett is a 89 y.o. female with a PMHx of hypertension, breast cancer, CAD, diabetes, asthma, dementia who presents to the ED with concerns for chest pain.  Per facility patient was evaluated at 6:55 AM when she complained of chest pain.  Patient was also evaluated again at 7:55 AM and patient was still complained of chest pain at that time.  Facility notes that they are unable to tell if her chest pain was constant or intermittent.  Facility also unable to throw how long the chest pain occurred for.  Patient denies chest pain, shortness of breath, abdominal pain, nausea, vomiting.  The history is provided by the patient. No language interpreter was used.       Home Medications Prior to Admission medications   Medication Sig Start Date End Date Taking? Authorizing Provider  aspirin 81 MG chewable tablet Chew 81 mg by mouth daily.    [provider]  Calcium Carb-Cholecalciferol (CALCIUM 600+D3 PO) Take 1 tablet by mouth daily.    [provider]  donepezil (ARICEPT) 5 MG tablet Take 5 mg by mouth daily. 03/14/16   [provider]  olmesartan (BENICAR) 40 MG tablet Take 40 mg by mouth daily.    [provider]  potassium chloride SA (KLOR-CON) 20 MEQ tablet Take 20 mEq by mouth daily.    [provider]  simvastatin (ZOCOR) 20 MG tablet Take 20 mg by mouth daily.    [provider]      Allergies    Lyrica [pregabalin], Feldene [piroxicam], Orudis [ketoprofen], Vibramycin [doxycycline calcium], and Zestril [lisinopril]    Review of Systems   Review of Systems  Cardiovascular:  Positive for chest pain.  All other systems reviewed and are negative.   Physical Exam Updated Vital Signs BP (!) 162/59 (BP  Location: Left Arm)   Pulse (!) 109   Temp 98.2 F (36.8 C) (Oral)   Resp 16   SpO2 100%  Physical Exam Vitals and nursing note reviewed.  Constitutional:      General: She is not in acute distress.    Appearance: She is not diaphoretic.  HENT:     Head: Normocephalic and atraumatic.     Mouth/Throat:     Pharynx: No oropharyngeal exudate.  Eyes:     General: No scleral icterus.    Conjunctiva/sclera: Conjunctivae normal.  Cardiovascular:     Rate and Rhythm: Normal rate and regular rhythm.     Pulses: Normal pulses.     Heart sounds: Normal heart sounds.  Pulmonary:     Effort: Pulmonary effort is normal. No respiratory distress.     Breath sounds: Normal breath sounds. No wheezing.  Chest:     Chest wall: No tenderness.     Comments: No chest wall TTP Abdominal:     General: Bowel sounds are normal.     Palpations: Abdomen is soft. There is no mass.     Tenderness: There is no abdominal tenderness. There is no guarding or rebound.  Musculoskeletal:        General: Normal range of motion.     Cervical back: Normal range of motion and neck supple.  Skin:    General: Skin is warm and dry.  Neurological:  Mental Status: She is alert.  Psychiatric:        Behavior: Behavior normal.    ED Results / Procedures / Treatments   Labs (all labs ordered are listed, but only abnormal results are displayed) Labs Reviewed  BASIC METABOLIC PANEL - Abnormal; Notable for the following components:      Result Value   BUN 30 (*)    Creatinine, Ser 1.27 (*)    GFR, Estimated 41 (*)    All other components within normal limits  CBC - Abnormal; Notable for the following components:   RBC 3.23 (*)    Hemoglobin 9.3 (*)    HCT 29.3 (*)    All other components within normal limits  TROPONIN I (HIGH SENSITIVITY) - Abnormal; Notable for the following components:   Troponin I (High Sensitivity) 18 (*)    All other components within normal limits  BRAIN NATRIURETIC PEPTIDE   TROPONIN I (HIGH SENSITIVITY)    EKG EKG Interpretation  Date/Time:  Wednesday September 08 2022 09:42:27 EST Ventricular Rate:  92 PR Interval:  142 QRS Duration: 76 QT Interval:  368 QTC Calculation: 455 R Axis:   12 Text Interpretation: Normal sinus rhythm Anterior infarct , age undetermined ST & T wave abnormality, consider lateral ischemia Abnormal ECG When compared with ECG of 08-Sep-2022 08:28, t wave inversions more prominent Confirmed by Aletta Edouard [phone]) on 09/08/2022 9:51:14 AM  Radiology DG Chest 1 View  Result Date: 09/08/2022 CLINICAL DATA:  Chest pain EXAM: CHEST  1 VIEW COMPARISON:  03/22/2022 FINDINGS: Stable cardiomegaly. Aortic atherosclerosis. Pulmonary vascular congestion. No overt pulmonary edema. No focal consolidation. No pleural effusion or pneumothorax. IMPRESSION: Cardiomegaly with pulmonary vascular congestion. No overt pulmonary edema or focal airspace consolidation. Electronically Signed   By: Davina Poke D.O.   On: 09/08/2022 09:28    Procedures Procedures    Medications Ordered in ED Medications - No data to display  ED Course/ Medical Decision Making/ A&P Clinical Course as of 09/08/22 1152  Wed Sep 08, 2022  0901 Conversation with [facility] administration who notes that the tech evaluated the patient at 6:55 am and patient reported chest pain. Tech evaluated the patient at 7:55 am and patient again was complaining of chest pain.  [SB]  [id number] 89 year old female with dementia here with reported chest pain at her facility.  Patient has no complaints at this time.  Getting EKG labs chest x-ray.  Disposition per results of testing. [MB]  [id number] Patient reevaluated and speaking with the tech without concerns at this time. Discussed with patient and family at bedside regarding lab and imaging studies.  Answered all available questions.  Patient without chest pain throughout her time in the emergency department.  Family agreeable to transport  patient back to facility. [SB]    Clinical Course User Index [MB] Hayden Rasmussen, MD [SB] Skylynn Burkley A, PA-C                             Medical Decision Making Amount and/or Complexity of Data Reviewed Labs: ordered. Decision-making details documented in ED Course. Radiology: ordered.   Patient presents to the ED with chest pain onset today. Vital signs pt afebrile, patient not hypoxic, slightly tachycardic. On exam patient with no chest wall TTP. Otherwise, no acute cardiovascular, respiratory, abdominal findings. Differential diagnosis includes ACS, aortic dissection, pneumothorax, PE, PNA.    Labs:  I ordered, and personally interpreted labs.  The pertinent  results include:   Initial troponin at 18, delta troponin at 17  CBC without leukocytosis, hemoglobin at 9.3, stable from previous value BMP with slightly elevated creatinine at 1.27, stable from baseline  Imaging: I ordered imaging studies including CXR I independently visualized and interpreted imaging which showed:  Cardiomegaly with pulmonary vascular congestion. No overt pulmonary  edema or focal airspace consolidation.   I agree with the radiologist interpretation  Disposition: Presentation suspicious for atypical chest pain.  Patient without concerns for chest pain in the ED.  No chest pain during duration of ED visit.  Family member at bedside noted that patient typically rubs at her chest after her mastectomy however does not have chest pain typically. EKG without acute ST/T changes, troponins negative, chest x-ray negative, low suspicion for ACS at this time. Chest x-ray without acute findings, vital signs stable, doubt aortic dissection or pneumothorax at this time.  Case discussed with attending who evaluated patient and agrees with discharge treatment plan at this time. After consideration of the diagnostic results and the patients response to treatment, I feel that the patient would benefit from Discharge  home. Supportive care measures and strict return precautions discussed with daughter at bedside. Daughter acknowledges and verbalizes understanding. Pt appears safe for discharge. Follow up as indicated in discharge paperwork.   This chart was dictated using voice recognition software, Dragon. Despite the best efforts of this provider to proofread and correct errors, errors may still occur which can change documentation meaning.   Final Clinical Impression(s) / ED Diagnoses Final diagnoses:  Chest pain, unspecified type    Rx / DC Orders ED Discharge Orders     None         Vic Esco A, PA-C 09/08/22 1533    Hayden Rasmussen, MD 09/08/22 1740

## 2022-09-08 NOTE — Discharge Instructions (Signed)
It was a pleasure taking care of you today!   Your workup was negative in the ED. You may apply ice or heat to the affected area for 15 minutes at a time.  Ensure to place a barrier between your skin and and the ice.  Continue taking your medications as prescribed.  Follow-up with your primary care provider for evaluation of your symptoms. You may return to the ED if you are experiencing increasing/worsening chest pain, shortness of breath, or worsening symptoms.

## 2022-09-08 NOTE — ED Triage Notes (Signed)
Pt here from Nursing home ( [facility] ) with c/o chest pain , no n/v or sob , pt has asa at Nursing home , no c/o cp on arrival , dementia at baseline

## 2022-10-14 ENCOUNTER — Emergency Department (HOSPITAL_COMMUNITY)
Admission: EM | Admit: 2022-10-14 | Discharge: 2022-10-15 | Disposition: A | Discharge status: Home / Self Care | Payer: Medicare Other | Attending: Student | Admitting: Student

## 2022-10-14 ENCOUNTER — Other Ambulatory Visit: Payer: Self-pay

## 2022-10-14 ENCOUNTER — Emergency Department (HOSPITAL_COMMUNITY): Payer: Medicare Other

## 2022-10-14 DIAGNOSIS — S0083XA Contusion of other part of head, initial encounter: Secondary | ICD-10-CM | POA: Insufficient documentation

## 2022-10-14 DIAGNOSIS — E119 Type 2 diabetes mellitus without complications: Secondary | ICD-10-CM | POA: Diagnosis not present

## 2022-10-14 DIAGNOSIS — Z79899 Other long term (current) drug therapy: Secondary | ICD-10-CM | POA: Diagnosis not present

## 2022-10-14 DIAGNOSIS — F0390 Unspecified dementia without behavioral disturbance: Secondary | ICD-10-CM | POA: Diagnosis not present

## 2022-10-14 DIAGNOSIS — Z7982 Long term (current) use of aspirin: Secondary | ICD-10-CM | POA: Diagnosis not present

## 2022-10-14 DIAGNOSIS — I10 Essential (primary) hypertension: Secondary | ICD-10-CM | POA: Insufficient documentation

## 2022-10-14 DIAGNOSIS — S0003XA Contusion of scalp, initial encounter: Secondary | ICD-10-CM

## 2022-10-14 DIAGNOSIS — S0990XA Unspecified injury of head, initial encounter: Secondary | ICD-10-CM | POA: Diagnosis present

## 2022-10-14 DIAGNOSIS — W19XXXA Unspecified fall, initial encounter: Secondary | ICD-10-CM | POA: Diagnosis not present

## 2022-10-14 NOTE — ED Triage Notes (Signed)
Pt BIB EMS after unwitnessed fall. Hematoma to posterior scalp, no blood thinners. Pt hx dementia

## 2022-10-14 NOTE — ED Provider Notes (Signed)
[place] EMERGENCY DEPARTMENT AT [facility] Provider Note   CSN: [id number] Arrival date & time: 10/14/22  2155     History  Chief Complaint  Patient presents with   Jennifer Garrett is a 89 y.o. female.  HPI   Patient with medical history including hypertension, arthritis, hyperlipidemia, diabetes, presenting from a nursing facility after a fall.  Patient is a poor historian, but states that she just has pain in her head, she states she has no other complaints.  Spoke with nursing staff from [facility] staff states that patient was recently just placed to bed they heard a fall and found the patient on the ground, states that she was on the ground for proxy 20 minutes, states that she is at her baseline patient is dementia and generally keeps herself, she generally walks on her own, she has had no cough congestion fevers chills nonnursing any urinary symptoms, she is not on any anticoag's.  Home Medications Prior to Admission medications   Medication Sig Start Date End Date Taking? Authorizing Provider  aspirin 81 MG chewable tablet Chew 81 mg by mouth daily.    [provider]  Calcium Carb-Cholecalciferol (CALCIUM 600+D3 PO) Take 1 tablet by mouth daily.    [provider]  donepezil (ARICEPT) 5 MG tablet Take 5 mg by mouth daily. 03/14/16   [provider]  olmesartan (BENICAR) 40 MG tablet Take 40 mg by mouth daily.    [provider]  potassium chloride SA (KLOR-CON) 20 MEQ tablet Take 20 mEq by mouth daily.    [provider]  simvastatin (ZOCOR) 20 MG tablet Take 20 mg by mouth daily.    [provider]      Allergies    Lyrica [pregabalin], Feldene [piroxicam], Orudis [ketoprofen], Vibramycin [doxycycline calcium], and Zestril [lisinopril]    Review of Systems   Review of Systems  Unable to perform ROS: Dementia    Physical Exam Updated Vital Signs BP 132/74 (BP Location: Left Arm)    Pulse 60   Temp 98.5 F (36.9 C) (Oral)   Resp 16   Ht 5\' 5"  (1.651 m)   Wt 58.7 kg   SpO2 100%   BMI 21.53 kg/m  Physical Exam Vitals and nursing note reviewed.  Constitutional:      General: She is not in acute distress.    Appearance: She is not ill-appearing.  HENT:     Head: Normocephalic and atraumatic.     Comments: Patient has a large hematoma on the occipital lobe, hemodynamically stable, there is no raccoon eyes or Battle sign noted.    Nose: No congestion.     Mouth/Throat:     Mouth: Mucous membranes are moist.     Pharynx: Oropharynx is clear.     Comments: No trismus no torticollis no oral trauma present.  Eyes:     Extraocular Movements: Extraocular movements intact.     Conjunctiva/sclera: Conjunctivae normal.     Pupils: Pupils are equal, round, and reactive to light.  Cardiovascular:     Rate and Rhythm: Normal rate and regular rhythm.     Pulses: Normal pulses.     Heart sounds: No murmur heard.    No friction rub. No gallop.  Pulmonary:     Effort: No respiratory distress.     Breath sounds: No wheezing, rhonchi or rales.     Comments: No evidence of trauma noted on patient's chest, chest was nontender  to palpation. Abdominal:     Palpations: Abdomen is soft.     Tenderness: There is no abdominal tenderness. There is no right CVA tenderness or left CVA tenderness.     Comments: Abdomen nondistended, soft, no guarding rebound or peritoneal sign, no evidence of trauma during my examination.  Musculoskeletal:     Comments: Spine was palpated is nontender to palpation no step-offs or deformities noted, no pelvis instability no leg shortening, patient is moving her upper and lower extremities out difficulty.  Skin:    General: Skin is warm and dry.  Neurological:     Mental Status: She is alert.     Comments: No facial asymmetry no difficulty with word finding following two-step commands there is no unilateral weakness present.  Psychiatric:         Mood and Affect: Mood normal.     ED Results / Procedures / Treatments   Labs (all labs ordered are listed, but only abnormal results are displayed) Labs Reviewed - No data to display  EKG None  Radiology CT Cervical Spine Wo Contrast  Result Date: 10/14/2022 CLINICAL DATA:  Unwitnessed fall EXAM: CT CERVICAL SPINE WITHOUT CONTRAST TECHNIQUE: Multidetector CT imaging of the cervical spine was performed without intravenous contrast. Multiplanar CT image reconstructions were also generated. RADIATION DOSE REDUCTION: This exam was performed according to the departmental dose-optimization program which includes automated exposure control, adjustment of the mA and/or kV according to patient size and/or use of iterative reconstruction technique. COMPARISON:  CT 08/03/2022, 04/12/2016 FINDINGS: Alignment: No subluxation.  Facet alignment within normal limits Skull base and vertebrae: Craniovertebral junction appears intact. Age indeterminate mild superior endplate deformity at T1. Cervical vertebral bodies show no definitive fracture. Soft tissues and spinal canal: No prevertebral fluid or swelling. No visible canal hematoma. Disc levels: Moderate severe disc space narrowing C5-C6. Multilevel facet degenerative changes. Upper chest: Lung apices are clear. Incompletely visualized 2.5 cm hypodense nodule left lobe of thyroid. Stability for greater than 5 years implies benignity; no biopsy or followup indicated (ref: J Am Coll Radiol. 2015 Feb;12(2): 143-50). Other: None IMPRESSION: 1. No acute fracture or malalignment of the cervical spine. Age indeterminate mild superior endplate deformity at T1. 2. Multilevel degenerative changes of the cervical spine. Electronically Signed   By: Donavan Foil M.D.   On: 10/14/2022 23:30   CT Head Wo Contrast  Result Date: 10/14/2022 CLINICAL DATA:  Fall. EXAM: CT HEAD WITHOUT CONTRAST TECHNIQUE: Contiguous axial images were obtained from the base of the skull through  the vertex without intravenous contrast. RADIATION DOSE REDUCTION: This exam was performed according to the departmental dose-optimization program which includes automated exposure control, adjustment of the mA and/or kV according to patient size and/or use of iterative reconstruction technique. COMPARISON:  Chest x-ray 08/03/2022 FINDINGS: Brain: No evidence of acute infarction, hemorrhage, hydrocephalus, extra-axial collection or mass lesion/mass effect. Again seen is moderate diffuse atrophy and mild periventricular white matter hypodensity, likely chronic small vessel ischemic change. Vascular: Atherosclerotic calcifications are present within the cavernous internal carotid arteries. Skull: Normal. Negative for fracture or focal lesion. Sinuses/Orbits: No acute finding. Other: Scalp hematoma is seen overlying the posterior vertex. IMPRESSION: 1. No acute intracranial process. 2. Scalp hematoma overlying the posterior vertex. 3. Moderate diffuse atrophy and mild periventricular white matter hypodensity, likely chronic small vessel ischemic change. Electronically Signed   By: Ronney Asters M.D.   On: 10/14/2022 23:26   DG Chest 1 View  Result Date: 10/14/2022 CLINICAL DATA:  Recent fall  with chest pain, initial encounter EXAM: PORTABLE CHEST 1 VIEW COMPARISON:  None Available. FINDINGS: Cardiac shadow is enlarged. Aortic calcifications are noted. Lungs are clear. No acute bony abnormality is noted. IMPRESSION: Stable cardiomegaly.  No acute abnormality noted. Electronically Signed   By: Inez Catalina M.D.   On: 10/14/2022 23:06   DG Pelvis 1-2 Views  Result Date: 10/14/2022 CLINICAL DATA:  Trauma. Unwitnessed fall at nursing home with body aches. EXAM: PELVIS - 1-2 VIEW COMPARISON:  CT abdomen and pelvis 09/28/2019 FINDINGS: Degenerative changes in the lower lumbar spine and in both hips. Symmetrical appearance. No evidence of acute fracture or dislocation. SI joints and symphysis pubis are not displaced.  Soft tissues are unremarkable. IMPRESSION: Degenerative changes in the hips. No acute displaced fractures identified. Electronically Signed   By: Lucienne Capers M.D.   On: 10/14/2022 22:59    Procedures Procedures    Medications Ordered in ED Medications - No data to display  ED Course/ Medical Decision Making/ A&P                             Medical Decision Making Amount and/or Complexity of Data Reviewed Radiology: ordered.   This patient presents to the ED for concern of fall, this involves an extensive number of treatment options, and is a complaint that carries with it a high risk of complications and morbidity.  The differential diagnosis includes intracranial bleed, thoracic/abdominal trauma, orthopedic injury    Additional history obtained:  Additional history obtained from nursing staff External records from outside source obtained and reviewed including recent ED notes   Co morbidities that complicate the patient evaluation  N/A  Social Determinants of Health:  Dementia    Lab Tests:  I Ordered, and personally interpreted labs.  The pertinent results include: N/A   Imaging Studies ordered:  I ordered imaging studies including chest x-ray, pelvic x-ray, CT head, C-spine I independently visualized and interpreted imaging which showed x-rays are negative acute findings, without signs of ischemia, CT C-spine negative, age-indeterminate T1 endplate deformity I agree with the radiologist interpretation   Cardiac Monitoring:  The patient was maintained on a cardiac monitor.  I personally viewed and interpreted the cardiac monitored which showed an underlying rhythm of: N/A   Medicines ordered and prescription drug management:  I ordered medication including N/A I have reviewed the patients home medicines and have made adjustments as needed  Critical Interventions:  N/A   Reevaluation:  Presents after a fall, will obtain CT imaging for further  evaluation.  CT imaging reveals questionable T1 endplate fracture, on my exam reassessed patient spine is nontender to palpation I doubt this is an acute fracture, patient is ambulating, will discharge home.  Consultations Obtained:  N/A    Test Considered:  CT chest abdomen pelvis-deferred as my suspicion for thoracic/abdominal trauma is low both were nontender to palpation no evident trauma present during my examination.    Rule out low suspicion for intracranial head bleed and or CVA as patient denies loss of conscious, is not on anticoagulant, no focal deficits present on my exam ct head is negative.  Low suspicion for spinal cord abnormality or spinal fracture spine was palpated was nontender to palpation, patient has full range of motion in the upper and lower extremities.  Low suspicion for pneumothorax as lung sounds are clear bilaterally, x-ray is negative for acute findings.  Low suspicion for intra-abdominal trauma as abdomen soft nontender  to palpation.  Low suspicion for orthopedic injury as imaging is negative for acute findings.      Dispostion and problem list  After consideration of the diagnostic results and the patients response to treatment, I feel that the patent would benefit from discharge.   Occipital hematoma-will recommend basic wound care, follow-up with PCP for further evaluation and strict return precautions.            Final Clinical Impression(s) / ED Diagnoses Final diagnoses:  Hematoma of occipital region of scalp    Rx / DC Orders ED Discharge Orders     None      CT   Marcello Fennel, PA-C 10/15/22 0032    Orpah Greek, MD 10/16/22 [phone]

## 2022-10-15 NOTE — ED Notes (Signed)
[facility] called for transport back to [facility]. Attempted to call report to [facility] x2 with no answer.

## 2022-10-15 NOTE — ED Provider Notes (Incomplete)
[place] EMERGENCY DEPARTMENT AT [facility] Provider Note   CSN: [id number] Arrival date & time: 10/14/22  2155     History {Add pertinent medical, surgical, social history, OB history to HPI:1} Chief Complaint  Patient presents with  . Fall    Jennifer Garrett is a 89 y.o. female.  HPI   Patient with medical history including hypertension, arthritis, hyperlipidemia, diabetes, presenting from a nursing facility after a fall.  Patient is a poor historian, but states that she just has pain in her head, she states she has no other complaints.  Spoke with nursing staff from [place] staff states that patient was recently just placed to bed they heard a fall and found the patient on the ground, states that she was on the ground for proxy 20 minutes, states that she is at her baseline patient is dementia and generally keeps herself, she generally walks on her own, she has had no cough congestion fevers chills nonnursing any urinary symptoms, she is not on any anticoag's.  Home Medications Prior to Admission medications   Medication Sig Start Date End Date Taking? Authorizing Provider  aspirin 81 MG chewable tablet Chew 81 mg by mouth daily.    [provider]  Calcium Carb-Cholecalciferol (CALCIUM 600+D3 PO) Take 1 tablet by mouth daily.    [provider]  donepezil (ARICEPT) 5 MG tablet Take 5 mg by mouth daily. 03/14/16   [provider]  olmesartan (BENICAR) 40 MG tablet Take 40 mg by mouth daily.    [provider]  potassium chloride SA (KLOR-CON) 20 MEQ tablet Take 20 mEq by mouth daily.    [provider]  simvastatin (ZOCOR) 20 MG tablet Take 20 mg by mouth daily.    [provider]      Allergies    Lyrica [pregabalin], Feldene [piroxicam], Orudis [ketoprofen], Vibramycin [doxycycline calcium], and Zestril [lisinopril]    Review of Systems   Review of Systems  Unable to perform ROS: Dementia     Physical Exam Updated Vital Signs BP 132/74 (BP Location: Left Arm)   Pulse 60   Temp 98.5 F (36.9 C) (Oral)   Resp 16   Ht 5\' 5"  (1.651 m)   Wt 58.7 kg   SpO2 100%   BMI 21.53 kg/m  Physical Exam Vitals and nursing note reviewed.  Constitutional:      General: She is not in acute distress.    Appearance: She is not ill-appearing.  HENT:     Head: Normocephalic and atraumatic.     Comments: Patient has a large hematoma on the occipital lobe, hemodynamically stable, there is no raccoon eyes or Battle sign noted.    Nose: No congestion.     Mouth/Throat:     Mouth: Mucous membranes are moist.     Pharynx: Oropharynx is clear.     Comments: No trismus no torticollis no oral trauma present.  Eyes:     Extraocular Movements: Extraocular movements intact.     Conjunctiva/sclera: Conjunctivae normal.     Pupils: Pupils are equal, round, and reactive to light.  Cardiovascular:     Rate and Rhythm: Normal rate and regular rhythm.     Pulses: Normal pulses.     Heart sounds: No murmur heard.    No friction rub. No gallop.  Pulmonary:     Effort: No respiratory distress.     Breath sounds: No wheezing, rhonchi or rales.     Comments: No evidence  of trauma noted on patient's chest, chest was nontender to palpation. Abdominal:     Palpations: Abdomen is soft.     Tenderness: There is no abdominal tenderness. There is no right CVA tenderness or left CVA tenderness.     Comments: Abdomen nondistended, soft, no guarding rebound or peritoneal sign, no evidence of trauma during my examination.  Musculoskeletal:     Comments: Spine was palpated is nontender to palpation no step-offs or deformities noted, no pelvis instability no leg shortening, patient is moving her upper and lower extremities out difficulty.  Skin:    General: Skin is warm and dry.  Neurological:     Mental Status: She is alert.     Comments: No facial asymmetry no difficulty with word finding following two-step  commands there is no unilateral weakness present.  Psychiatric:        Mood and Affect: Mood normal.     ED Results / Procedures / Treatments   Labs (all labs ordered are listed, but only abnormal results are displayed) Labs Reviewed - No data to display  EKG None  Radiology CT Cervical Spine Wo Contrast  Result Date: 10/14/2022 CLINICAL DATA:  Unwitnessed fall EXAM: CT CERVICAL SPINE WITHOUT CONTRAST TECHNIQUE: Multidetector CT imaging of the cervical spine was performed without intravenous contrast. Multiplanar CT image reconstructions were also generated. RADIATION DOSE REDUCTION: This exam was performed according to the departmental dose-optimization program which includes automated exposure control, adjustment of the mA and/or kV according to patient size and/or use of iterative reconstruction technique. COMPARISON:  CT 08/03/2022, 04/12/2016 FINDINGS: Alignment: No subluxation.  Facet alignment within normal limits Skull base and vertebrae: Craniovertebral junction appears intact. Age indeterminate mild superior endplate deformity at T1. Cervical vertebral bodies show no definitive fracture. Soft tissues and spinal canal: No prevertebral fluid or swelling. No visible canal hematoma. Disc levels: Moderate severe disc space narrowing C5-C6. Multilevel facet degenerative changes. Upper chest: Lung apices are clear. Incompletely visualized 2.5 cm hypodense nodule left lobe of thyroid. Stability for greater than 5 years implies benignity; no biopsy or followup indicated (ref: J Am Coll Radiol. 2015 Feb;12(2): 143-50). Other: None IMPRESSION: 1. No acute fracture or malalignment of the cervical spine. Age indeterminate mild superior endplate deformity at T1. 2. Multilevel degenerative changes of the cervical spine. Electronically Signed   By: Donavan Foil M.D.   On: 10/14/2022 23:30   CT Head Wo Contrast  Result Date: 10/14/2022 CLINICAL DATA:  Fall. EXAM: CT HEAD WITHOUT CONTRAST TECHNIQUE:  Contiguous axial images were obtained from the base of the skull through the vertex without intravenous contrast. RADIATION DOSE REDUCTION: This exam was performed according to the departmental dose-optimization program which includes automated exposure control, adjustment of the mA and/or kV according to patient size and/or use of iterative reconstruction technique. COMPARISON:  Chest x-ray 08/03/2022 FINDINGS: Brain: No evidence of acute infarction, hemorrhage, hydrocephalus, extra-axial collection or mass lesion/mass effect. Again seen is moderate diffuse atrophy and mild periventricular white matter hypodensity, likely chronic small vessel ischemic change. Vascular: Atherosclerotic calcifications are present within the cavernous internal carotid arteries. Skull: Normal. Negative for fracture or focal lesion. Sinuses/Orbits: No acute finding. Other: Scalp hematoma is seen overlying the posterior vertex. IMPRESSION: 1. No acute intracranial process. 2. Scalp hematoma overlying the posterior vertex. 3. Moderate diffuse atrophy and mild periventricular white matter hypodensity, likely chronic small vessel ischemic change. Electronically Signed   By: Ronney Asters M.D.   On: 10/14/2022 23:26   DG Chest 1 View  Result Date: 10/14/2022 CLINICAL DATA:  Recent fall with chest pain, initial encounter EXAM: PORTABLE CHEST 1 VIEW COMPARISON:  None Available. FINDINGS: Cardiac shadow is enlarged. Aortic calcifications are noted. Lungs are clear. No acute bony abnormality is noted. IMPRESSION: Stable cardiomegaly.  No acute abnormality noted. Electronically Signed   By: Inez Catalina M.D.   On: 10/14/2022 23:06   DG Pelvis 1-2 Views  Result Date: 10/14/2022 CLINICAL DATA:  Trauma. Unwitnessed fall at nursing home with body aches. EXAM: PELVIS - 1-2 VIEW COMPARISON:  CT abdomen and pelvis 09/28/2019 FINDINGS: Degenerative changes in the lower lumbar spine and in both hips. Symmetrical appearance. No evidence of acute  fracture or dislocation. SI joints and symphysis pubis are not displaced. Soft tissues are unremarkable. IMPRESSION: Degenerative changes in the hips. No acute displaced fractures identified. Electronically Signed   By: Lucienne Capers M.D.   On: 10/14/2022 22:59    Procedures Procedures  {Document cardiac monitor, telemetry assessment procedure when appropriate:1}  Medications Ordered in ED Medications - No data to display  ED Course/ Medical Decision Making/ A&P   {   Click here for ABCD2, HEART and other calculatorsREFRESH Note before signing :1}                          Medical Decision Making Amount and/or Complexity of Data Reviewed Radiology: ordered.   This patient presents to the ED for concern of fall, this involves an extensive number of treatment options, and is a complaint that carries with it a high risk of complications and morbidity.  The differential diagnosis includes intracranial bleed, thoracic/abdominal trauma, orthopedic injury    Additional history obtained:  Additional history obtained from nursing staff External records from outside source obtained and reviewed including recent ED notes   Co morbidities that complicate the patient evaluation  N/A  Social Determinants of Health:  Dementia    Lab Tests:  I Ordered, and personally interpreted labs.  The pertinent results include: N/A   Imaging Studies ordered:  I ordered imaging studies including chest x-ray, pelvic x-ray, CT head, C-spine I independently visualized and interpreted imaging which showed x-rays are negative acute findings, without signs of ischemia, CT C-spine negative, age-indeterminate T1 endplate deformity I agree with the radiologist interpretation   Cardiac Monitoring:  The patient was maintained on a cardiac monitor.  I personally viewed and interpreted the cardiac monitored which showed an underlying rhythm of: N/A   Medicines ordered and prescription drug  management:  I ordered medication including N/A I have reviewed the patients home medicines and have made adjustments as needed  Critical Interventions:  N/A   Reevaluation:  After the interventions noted above, I reevaluated the patient and found that they have :{resolved/improved/worsened:23923::"improved"}  Consultations Obtained:  I requested consultation with the ***,  and discussed lab and imaging findings as well as pertinent plan - they recommend: ***    Test Considered:  ***    Rule out ****    Dispostion and problem list  After consideration of the diagnostic results and the patients response to treatment, I feel that the patent would benefit from ***.       {Document critical care time when appropriate:1} {Document review of labs and clinical decision tools ie heart score, Chads2Vasc2 etc:1}  {Document your independent review of radiology images, and any outside records:1} {Document your discussion with family members, caretakers, and with consultants:1} {Document social determinants of health affecting pt's care:1} {Document  your decision making why or why not admission, treatments were needed:1} Final Clinical Impression(s) / ED Diagnoses Final diagnoses:  None    Rx / DC Orders ED Discharge Orders     None

## 2022-10-15 NOTE — Discharge Instructions (Signed)
You have a small hematoma on the back of your head, recommend warm compresses to the area, this should heal on its own.  May use over-the-counter pain medication as needed.  Follow-up with your PCP for further evaluation.  Come back to the emergency department if you develop chest pain, shortness of breath, severe abdominal pain, uncontrolled nausea, vomiting, diarrhea.

## 2023-02-18 ENCOUNTER — Emergency Department (HOSPITAL_COMMUNITY): Payer: Medicare Other

## 2023-02-18 ENCOUNTER — Emergency Department (HOSPITAL_COMMUNITY)
Admission: EM | Admit: 2023-02-18 | Discharge: 2023-02-18 | Disposition: A | Discharge status: Skilled Nursing Facility | Payer: Medicare Other | Source: Home / Self Care | Attending: Emergency Medicine | Admitting: Emergency Medicine

## 2023-02-18 ENCOUNTER — Encounter (HOSPITAL_COMMUNITY): Payer: Self-pay

## 2023-02-18 ENCOUNTER — Other Ambulatory Visit: Payer: Self-pay

## 2023-02-18 DIAGNOSIS — I2510 Atherosclerotic heart disease of native coronary artery without angina pectoris: Secondary | ICD-10-CM | POA: Insufficient documentation

## 2023-02-18 DIAGNOSIS — W19XXXA Unspecified fall, initial encounter: Secondary | ICD-10-CM | POA: Insufficient documentation

## 2023-02-18 DIAGNOSIS — Z853 Personal history of malignant neoplasm of breast: Secondary | ICD-10-CM | POA: Diagnosis not present

## 2023-02-18 DIAGNOSIS — S0990XA Unspecified injury of head, initial encounter: Secondary | ICD-10-CM | POA: Diagnosis present

## 2023-02-18 DIAGNOSIS — I129 Hypertensive chronic kidney disease with stage 1 through stage 4 chronic kidney disease, or unspecified chronic kidney disease: Secondary | ICD-10-CM | POA: Diagnosis not present

## 2023-02-18 DIAGNOSIS — M542 Cervicalgia: Secondary | ICD-10-CM | POA: Insufficient documentation

## 2023-02-18 DIAGNOSIS — S0003XA Contusion of scalp, initial encounter: Secondary | ICD-10-CM | POA: Diagnosis not present

## 2023-02-18 DIAGNOSIS — N189 Chronic kidney disease, unspecified: Secondary | ICD-10-CM | POA: Diagnosis not present

## 2023-02-18 DIAGNOSIS — Z79899 Other long term (current) drug therapy: Secondary | ICD-10-CM | POA: Diagnosis not present

## 2023-02-18 DIAGNOSIS — Y92002 Bathroom of unspecified non-institutional (private) residence single-family (private) house as the place of occurrence of the external cause: Secondary | ICD-10-CM | POA: Insufficient documentation

## 2023-02-18 DIAGNOSIS — F0390 Unspecified dementia without behavioral disturbance: Secondary | ICD-10-CM | POA: Insufficient documentation

## 2023-02-18 DIAGNOSIS — Z7982 Long term (current) use of aspirin: Secondary | ICD-10-CM | POA: Diagnosis not present

## 2023-02-18 HISTORY — DX: Unspecified dementia, unspecified severity, without behavioral disturbance, psychotic disturbance, mood disturbance, and anxiety: F03.90

## 2023-02-18 LAB — CBC
HCT: 31.4 % — ABNORMAL LOW (ref 36.0–46.0)
Hemoglobin: 9.5 g/dL — ABNORMAL LOW (ref 12.0–15.0)
MCH: 28.4 pg (ref 26.0–34.0)
MCHC: 30.3 g/dL (ref 30.0–36.0)
MCV: 94 fL (ref 80.0–100.0)
Platelets: 175 10*3/uL (ref 150–400)
RBC: 3.34 MIL/uL — ABNORMAL LOW (ref 3.87–5.11)
RDW: 12.6 % (ref 11.5–15.5)
WBC: 4.6 10*3/uL (ref 4.0–10.5)
nRBC: 0 % (ref 0.0–0.2)

## 2023-02-18 LAB — BASIC METABOLIC PANEL
Anion gap: 7 (ref 5–15)
BUN: 45 mg/dL — ABNORMAL HIGH (ref 8–23)
CO2: 23 mmol/L (ref 22–32)
Calcium: 9 mg/dL (ref 8.9–10.3)
Chloride: 110 mmol/L (ref 98–111)
Creatinine, Ser: 1.43 mg/dL — ABNORMAL HIGH (ref 0.44–1.00)
GFR, Estimated: 35 mL/min — ABNORMAL LOW (ref >60)
Glucose, Bld: 82 mg/dL (ref 70–99)
Potassium: 5.6 mmol/L — ABNORMAL HIGH (ref 3.5–5.1)
Sodium: 140 mmol/L (ref 135–145)

## 2023-02-18 LAB — CBG MONITORING, ED: Glucose-Capillary: 76 mg/dL (ref 70–99)

## 2023-02-18 MED ORDER — SODIUM CHLORIDE 0.9 % IV BOLUS: 500.0000 mL | Freq: Once | INTRAVENOUS | Status: DC

## 2023-02-18 NOTE — ED Triage Notes (Addendum)
Patient BIB [facility] from [facility]. Has dementia. Unwitnessed fall in the bathroom. 2 inch hematoma in the back of her head. No thinners. Complaining of right wrist pain. Alert to self only.

## 2023-02-18 NOTE — ED Notes (Signed)
She has refused an EKG on 3 attempts. She doesn't want to be touched.

## 2023-02-18 NOTE — Discharge Instructions (Signed)
Blood test did show slight elevation in the creatinine and potassium levels.  I have those blood tests rechecked in a few days to make sure they are improving.  Try to drink plenty of fluids.  Take over-the-counter medications as needed for the headache from your fall.  Apply ice to help with the swelling

## 2023-02-18 NOTE — ED Notes (Signed)
Dr Lynelle Doctor made aware that patient is refusing to allow staff to obtain EKG

## 2023-02-18 NOTE — ED Provider Notes (Signed)
[place] EMERGENCY DEPARTMENT AT [facility] Provider Note   CSN: [id number] Arrival date & time: 02/18/23  3235     History  Chief Complaint  Patient presents with   Marletta Lor    SHATON LORE is a 89 y.o. female.   Fall     Patient has a history of hypertension arthritis hyperlipidemia breast cancer coronary artery disease dementia.  Patient presents to the ED for evaluation after an unwitnessed fall.  Patient was found in the bathroom on the floor.  They noted a hematoma to the posterior occiput.  Patient states that she did fall this morning.  She is not sure why.  She is complaining of pain in the back of her head and her neck.  She denies any pain in her arms or legs.  No chest pain.  No difficulty breathing.  No abdominal pain.  Triage notes indicate patient had been complaining of wrist pain.  No risk pain complaints to me  Home Medications Prior to Admission medications   Medication Sig Start Date End Date Taking? Authorizing Provider  aspirin 81 MG chewable tablet Chew 81 mg by mouth daily.   Yes [provider]  donepezil (ARICEPT) 10 MG tablet Take 10 mg by mouth daily. 03/14/16  Yes [provider]  olmesartan (BENICAR) 40 MG tablet Take 40 mg by mouth daily.   Yes [provider]  ondansetron (ZOFRAN) 4 MG tablet Take 4 mg by mouth every 6 (six) hours as needed for nausea or vomiting.   Yes [provider]  potassium chloride SA (KLOR-CON) 20 MEQ tablet Take 20 mEq by mouth daily.   Yes [provider]      Allergies    Lyrica [pregabalin], Feldene [piroxicam], Orudis [ketoprofen], Vibramycin [doxycycline calcium], and Zestril [lisinopril]    Review of Systems   Review of Systems  Physical Exam Updated Vital Signs BP (!) 113/90 (BP Location: Left Arm)   Pulse (!) 57   Temp (!) 97.5 F (36.4 C) (Oral)   Resp 15   Ht 1.651 m (5\' 5" )   Wt 59 kg   SpO2 96%   BMI 21.64 kg/m  Physical Exam Vitals and  nursing note reviewed.  Constitutional:      General: She is not in acute distress.    Appearance: She is well-developed.  HENT:     Head: Normocephalic. Contusion present.     Comments: Contusion left posterior occiput    Right Ear: External ear normal.     Left Ear: External ear normal.  Eyes:     General: No scleral icterus.       Right eye: No discharge.        Left eye: No discharge.     Conjunctiva/sclera: Conjunctivae normal.  Neck:     Trachea: No tracheal deviation.  Cardiovascular:     Rate and Rhythm: Normal rate and regular rhythm.  Pulmonary:     Effort: Pulmonary effort is normal. No respiratory distress.     Breath sounds: Normal breath sounds. No stridor. No wheezing or rales.  Abdominal:     General: Bowel sounds are normal. There is no distension.     Palpations: Abdomen is soft.     Tenderness: There is no abdominal tenderness. There is no guarding or rebound.  Musculoskeletal:        General: No deformity.     Cervical back: Neck supple. Tenderness present.     Thoracic back: No tenderness.  Lumbar back: No tenderness.     Comments: No swelling or tenderness in bilateral upper extremities or lower extremities, no thoracic or lumbar spine tenderness  Skin:    General: Skin is warm and dry.     Findings: No rash.  Neurological:     General: No focal deficit present.     Mental Status: She is alert.     Cranial Nerves: No cranial nerve deficit, dysarthria or facial asymmetry.     Sensory: No sensory deficit.     Motor: No abnormal muscle tone or seizure activity.     Coordination: Coordination normal.  Psychiatric:        Mood and Affect: Mood normal.     ED Results / Procedures / Treatments   Labs (all labs ordered are listed, but only abnormal results are displayed) Labs Reviewed  CBC - Abnormal; Notable for the following components:      Result Value   RBC 3.34 (*)    Hemoglobin 9.5 (*)    HCT 31.4 (*)    All other components within normal  limits  BASIC METABOLIC PANEL - Abnormal; Notable for the following components:   Potassium 5.6 (*)    BUN 45 (*)    Creatinine, Ser 1.43 (*)    GFR, Estimated 35 (*)    All other components within normal limits  CBG MONITORING, ED    EKG None  Radiology CT Head Wo Contrast  Result Date: 02/18/2023 CLINICAL DATA:  Head trauma, minor (Age >= 65y); Neck trauma (Age >= 65y) EXAM: CT HEAD WITHOUT CONTRAST CT CERVICAL SPINE WITHOUT CONTRAST TECHNIQUE: Multidetector CT imaging of the head and cervical spine was performed following the standard protocol without intravenous contrast. Multiplanar CT image reconstructions of the cervical spine were also generated. RADIATION DOSE REDUCTION: This exam was performed according to the departmental dose-optimization program which includes automated exposure control, adjustment of the mA and/or kV according to patient size and/or use of iterative reconstruction technique. COMPARISON:  None Available. FINDINGS: CT HEAD FINDINGS Brain: No evidence of acute infarction, hemorrhage, hydrocephalus, extra-axial collection or mass lesion/mass effect. Patchy white matter hypodensities, nonspecific but compatible with chronic microvascular ischemic disease. Vascular: Calcific atherosclerosis. Skull: No acute fracture.  Left posterior scalp contusion. Sinuses/Orbits: Clear sinuses.  No acute orbital findings. Other: No mastoid effusions. CT CERVICAL SPINE FINDINGS Alignment: No substantial sagittal subluxation. Rotation of C1 on C2, likely positional. Skull base and vertebrae: Vertebral body heights are maintained. Soft tissues and spinal canal: No prevertebral fluid or swelling. No visible canal hematoma. Disc levels: Multilevel degenerative change without high-grade bony canal or foraminal stenosis. Upper chest: Visualized lung apices are clear. IMPRESSION: 1. No evidence of acute abnormality intracranially or in the cervical spine. 2. Left posterior scalp contusion.  Electronically Signed   By: Feliberto Harts M.D.   On: 02/18/2023 10:29   CT Cervical Spine Wo Contrast  Result Date: 02/18/2023 CLINICAL DATA:  Head trauma, minor (Age >= 65y); Neck trauma (Age >= 65y) EXAM: CT HEAD WITHOUT CONTRAST CT CERVICAL SPINE WITHOUT CONTRAST TECHNIQUE: Multidetector CT imaging of the head and cervical spine was performed following the standard protocol without intravenous contrast. Multiplanar CT image reconstructions of the cervical spine were also generated. RADIATION DOSE REDUCTION: This exam was performed according to the departmental dose-optimization program which includes automated exposure control, adjustment of the mA and/or kV according to patient size and/or use of iterative reconstruction technique. COMPARISON:  None Available. FINDINGS: CT HEAD FINDINGS Brain: No evidence of  acute infarction, hemorrhage, hydrocephalus, extra-axial collection or mass lesion/mass effect. Patchy white matter hypodensities, nonspecific but compatible with chronic microvascular ischemic disease. Vascular: Calcific atherosclerosis. Skull: No acute fracture.  Left posterior scalp contusion. Sinuses/Orbits: Clear sinuses.  No acute orbital findings. Other: No mastoid effusions. CT CERVICAL SPINE FINDINGS Alignment: No substantial sagittal subluxation. Rotation of C1 on C2, likely positional. Skull base and vertebrae: Vertebral body heights are maintained. Soft tissues and spinal canal: No prevertebral fluid or swelling. No visible canal hematoma. Disc levels: Multilevel degenerative change without high-grade bony canal or foraminal stenosis. Upper chest: Visualized lung apices are clear. IMPRESSION: 1. No evidence of acute abnormality intracranially or in the cervical spine. 2. Left posterior scalp contusion. Electronically Signed   By: Feliberto Harts M.D.   On: 02/18/2023 10:29    Procedures Procedures    Medications Ordered in ED Medications - No data to display  ED Course/ Medical  Decision Making/ A&P Clinical Course as of 02/18/23 1101  Fri Feb 18, 2023  0903 Wrist x-rays attempted.  Pt would not cooperate with positioning.  Low suspicion for fx based on exam [JK]  1038 CBC(!) Hemoglobin shows stable anemia [JK]  1038 Basic metabolic panel(!) Creatinine slightly increased compared to previous.  Potassium slightly elevated [JK]    Clinical Course User Index [JK] Linwood Dibbles, MD                                 Medical Decision Making Problems Addressed: Chronic kidney disease, unspecified CKD stage: chronic illness or injury with exacerbation, progression, or side effects of treatment Contusion of scalp, initial encounter: acute illness or injury that poses a threat to life or bodily functions  Amount and/or Complexity of Data Reviewed Labs: ordered. Decision-making details documented in ED Course. Radiology: ordered and independent interpretation performed.   Patient presented to the ED for evaluation after a fall.  This was unwitnessed at the nursing facility.  Patient is alert and awake.  She is at her baseline.  Patient's daughter is here with her now as well.  I did order an EKG as well as a wrist x-ray.  Patient however is refused those test.  I do not see any significant swelling on exam.  I have low suspicion for serious fracture or injury.  Do not feel that we need to force the patient to have this x-ray performed.  Patient also has normal vital signs here she is asymptomatic.  Do not feel like we need to restrain her in order to obtain an EKG.  Laboratory test do show elevation in her creatinine compared to her most recent value although she has had elevated creatinines in the past.  Potassium also slightly increased.  Patient is refusing IV insertion.  She has not had any vomiting or diarrhea.  I did discuss with patient's daughter's options of trying to force her to have an IV versus encouraging fluids at home.  Patient still does not want the IV.  We will  not force IV insertion the patient.  Encouraged her to drink fluids and will asked that she have her blood test rechecked in the next few days        Final Clinical Impression(s) / ED Diagnoses Final diagnoses:  Contusion of scalp, initial encounter  Chronic kidney disease, unspecified CKD stage    Rx / DC Orders ED Discharge Orders     None  Linwood Dibbles, MD 02/18/23 1101

## 2023-02-18 NOTE — ED Notes (Signed)
[facility] called for transport.  

## 2023-05-13 ENCOUNTER — Other Ambulatory Visit: Payer: Self-pay

## 2023-05-13 ENCOUNTER — Emergency Department (HOSPITAL_COMMUNITY)
Admission: EM | Admit: 2023-05-13 | Discharge: 2023-05-13 | Disposition: A | Discharge status: Home / Self Care | Payer: Medicare Other | Attending: Student | Admitting: Student

## 2023-05-13 ENCOUNTER — Emergency Department (HOSPITAL_COMMUNITY): Payer: Medicare Other

## 2023-05-13 ENCOUNTER — Encounter (HOSPITAL_COMMUNITY): Payer: Self-pay

## 2023-05-13 DIAGNOSIS — J45909 Unspecified asthma, uncomplicated: Secondary | ICD-10-CM | POA: Insufficient documentation

## 2023-05-13 DIAGNOSIS — I2510 Atherosclerotic heart disease of native coronary artery without angina pectoris: Secondary | ICD-10-CM | POA: Insufficient documentation

## 2023-05-13 DIAGNOSIS — F0150 Vascular dementia without behavioral disturbance: Secondary | ICD-10-CM | POA: Diagnosis not present

## 2023-05-13 DIAGNOSIS — Z87891 Personal history of nicotine dependence: Secondary | ICD-10-CM | POA: Insufficient documentation

## 2023-05-13 DIAGNOSIS — I10 Essential (primary) hypertension: Secondary | ICD-10-CM | POA: Insufficient documentation

## 2023-05-13 DIAGNOSIS — E119 Type 2 diabetes mellitus without complications: Secondary | ICD-10-CM | POA: Insufficient documentation

## 2023-05-13 DIAGNOSIS — Z79899 Other long term (current) drug therapy: Secondary | ICD-10-CM | POA: Insufficient documentation

## 2023-05-13 DIAGNOSIS — Z853 Personal history of malignant neoplasm of breast: Secondary | ICD-10-CM | POA: Diagnosis not present

## 2023-05-13 DIAGNOSIS — Z7982 Long term (current) use of aspirin: Secondary | ICD-10-CM | POA: Insufficient documentation

## 2023-05-13 DIAGNOSIS — R4182 Altered mental status, unspecified: Secondary | ICD-10-CM | POA: Diagnosis present

## 2023-05-13 DIAGNOSIS — R404 Transient alteration of awareness: Secondary | ICD-10-CM | POA: Diagnosis not present

## 2023-05-13 LAB — CBC WITH DIFFERENTIAL/PLATELET
Abs Immature Granulocytes: 0.01 10*3/uL (ref 0.00–0.07)
Basophils Absolute: 0.1 10*3/uL (ref 0.0–0.1)
Basophils Relative: 1 %
Eosinophils Absolute: 0.1 10*3/uL (ref 0.0–0.5)
Eosinophils Relative: 3 %
HCT: 30.5 % — ABNORMAL LOW (ref 36.0–46.0)
Hemoglobin: 9.3 g/dL — ABNORMAL LOW (ref 12.0–15.0)
Immature Granulocytes: 0 %
Lymphocytes Relative: 49 %
Lymphs Abs: 2.5 10*3/uL (ref 0.7–4.0)
MCH: 27.8 pg (ref 26.0–34.0)
MCHC: 30.5 g/dL (ref 30.0–36.0)
MCV: 91 fL (ref 80.0–100.0)
Monocytes Absolute: 0.7 10*3/uL (ref 0.1–1.0)
Monocytes Relative: 14 %
Neutro Abs: 1.6 10*3/uL — ABNORMAL LOW (ref 1.7–7.7)
Neutrophils Relative %: 33 %
Platelets: 188 10*3/uL (ref 150–400)
RBC: 3.35 MIL/uL — ABNORMAL LOW (ref 3.87–5.11)
RDW: 12.6 % (ref 11.5–15.5)
WBC: 5 10*3/uL (ref 4.0–10.5)
nRBC: 0 % (ref 0.0–0.2)

## 2023-05-13 LAB — COMPREHENSIVE METABOLIC PANEL
ALT: 9 U/L (ref 0–44)
AST: 21 U/L (ref 15–41)
Albumin: 3.5 g/dL (ref 3.5–5.0)
Alkaline Phosphatase: 54 U/L (ref 38–126)
Anion gap: 6 (ref 5–15)
BUN: 33 mg/dL — ABNORMAL HIGH (ref 8–23)
CO2: 23 mmol/L (ref 22–32)
Calcium: 9.1 mg/dL (ref 8.9–10.3)
Chloride: 108 mmol/L (ref 98–111)
Creatinine, Ser: 1.23 mg/dL — ABNORMAL HIGH (ref 0.44–1.00)
GFR, Estimated: 42 mL/min — ABNORMAL LOW (ref >60)
Glucose, Bld: 77 mg/dL (ref 70–99)
Potassium: 4.3 mmol/L (ref 3.5–5.1)
Sodium: 137 mmol/L (ref 135–145)
Total Bilirubin: 0.4 mg/dL (ref 0.3–1.2)
Total Protein: 6.9 g/dL (ref 6.5–8.1)

## 2023-05-13 LAB — URINALYSIS, ROUTINE W REFLEX MICROSCOPIC
Bilirubin Urine: NEGATIVE
Glucose, UA: NEGATIVE mg/dL
Hgb urine dipstick: NEGATIVE
Ketones, ur: NEGATIVE mg/dL
Leukocytes,Ua: NEGATIVE
Nitrite: NEGATIVE
Protein, ur: NEGATIVE mg/dL
Specific Gravity, Urine: 1.008 (ref 1.005–1.030)
pH: 6 (ref 5.0–8.0)

## 2023-05-13 LAB — RAPID URINE DRUG SCREEN, HOSP PERFORMED
Amphetamines: NOT DETECTED
Barbiturates: NOT DETECTED
Benzodiazepines: NOT DETECTED
Cocaine: NOT DETECTED
Opiates: NOT DETECTED
Tetrahydrocannabinol: NOT DETECTED

## 2023-05-13 NOTE — ED Notes (Signed)
Patient discharged by this RN. Report given to [facility] to return to [facility].

## 2023-05-13 NOTE — ED Notes (Signed)
Patient's daughter updated on patient status.

## 2023-05-13 NOTE — ED Provider Notes (Signed)
[place] EMERGENCY DEPARTMENT AT [facility] Provider Note  CSN: [id number] Arrival date & time: 05/13/23 0403  Chief Complaint(s) Altered Mental Status  HPI Jennifer Garrett is a 89 y.o. female with PMH breast cancer, CAD, dementia, HTN, HLD who presents emergency department for evaluation of altered mental status.  Patient arrives from skilled nursing facility with reported foul-smelling urine.  Patient has known history of dementia but reportedly had a worsening of her mental status tonight.  Here in the emergency room, patient is alert answering questions and does not appear confused.  Denies chest pain, shortness of breath, abdominal pain, nausea, vomiting or other systemic symptoms.   Past Medical History Past Medical History:  Diagnosis Date   Arthritis    Asthma    rarely uses neb   Breast cancer (HCC) 05/25/2016   right breast   Breast cancer of lower-inner quadrant of right female breast (HCC) 01/22/2016   Carpal tunnel syndrome    Coronary artery disease    DES to OM3 90% stenosis 2003, 90% small RCA stenosis.     Dementia (HCC)    Diabetes mellitus    not taken glipizide in over a year, checks surgars daily   History of stomach ulcers    Hyperlipemia    Hypertension    Irregular heart beat    Vascular dementia [facility])    Patient Active Problem List   Diagnosis Date Noted   Type 2 diabetes mellitus with complication, without long-term current use of insulin (HCC) 03/22/2022   Vascular dementia (HCC) 03/22/2022   Seizure-like activity (HCC) 03/22/2022   Syncope and collapse with seizure like activity and bradycardia 03/22/2022   Acute renal failure superimposed on stage 3b chronic kidney disease (HCC) 03/22/2022   Normocytic anemia 03/22/2022   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 Authorizing Provider  aspirin 81 MG chewable tablet Chew 81 mg by mouth daily.    [provider]  donepezil (ARICEPT) 10 MG tablet Take 10 mg by mouth daily. 03/14/16   [provider]  olmesartan (BENICAR) 40 MG tablet Take 40 mg by mouth daily.    [provider]  ondansetron (ZOFRAN) 4 MG tablet Take 4 mg by mouth every 6 (six) hours as needed for nausea or vomiting.    [provider]  potassium chloride SA (KLOR-CON) 20 MEQ tablet Take 20 mEq by mouth daily.    [provider]  Past Surgical History Past Surgical History:  Procedure Laterality Date   ABDOMINAL HYSTERECTOMY     CARDIAC CATHETERIZATION  2003   stent-   CARPAL TUNNEL RELEASE  03/29/2012   Procedure: CARPAL TUNNEL RELEASE;  Surgeon: Nicki Reaper, MD;  Location: [facility];  Service: Orthopedics;  Laterality: Left;   EYE SURGERY     catracts   LESION EXCISION WITH COMPLEX REPAIR Right 05/25/2016   Procedure: complex repair of 25cm wound;  Surgeon: Glenna Fellows, MD;  Location: [facility] OR;  Service: Plastics;  Laterality: Right;   MASTECTOMY     right   MASTECTOMY MODIFIED RADICAL Right 05/25/2016   Procedure: RIGHT MASTECTOMY MODIFIED RADICAL;  Surgeon: Claud Kelp, MD;  Location: [facility] OR;  Service: General;  Laterality: Right;   PUNCH BIOPSY OF SKIN Left 04/04/2017   Procedure: PUNCH BIOPSY OF LEFT BREAST SKIN;  Surgeon: Claud Kelp, MD;  Location: [facility];  Service:  General;  Laterality: Left;   RADIOACTIVE SEED GUIDED AXILLARY SENTINEL LYMPH NODE Left 04/04/2017   Procedure: RADIOACTIVE SEED TARGETED LEFT AXILLARY LYMPH NODE EXCISION;  Surgeon: Claud Kelp, MD;  Location: [facility];  Service: General;  Laterality: Left;   SHOULDER ARTHROSCOPY     right and left   TRIGGER FINGER RELEASE  03/29/2012   Procedure: RELEASE TRIGGER FINGER/A-1 PULLEY;  Surgeon: Nicki Reaper, MD;  Location: [facility];  Service: Orthopedics;  Laterality: Left;   Family History Family History  Problem Relation Age of Onset   Heart disease Mother        No details     Social History Social History   Tobacco Use   Smoking status: Former    Current packs/day: 0.00    Types: Cigarettes    Quit date: 03/24/1978    Years since quitting: 45.1   Smokeless tobacco: Never  Vaping Use   Vaping status: Never Used  Substance Use Topics   Alcohol use: No   Drug use: No   Allergies Lyrica [pregabalin], Feldene [piroxicam], Orudis [ketoprofen], Vibramycin [doxycycline calcium], and Zestril [lisinopril]  Review of Systems Review of Systems  Psychiatric/Behavioral:  Positive for confusion.     Physical Exam Vital Signs  I have reviewed the triage vital signs BP (!) 129/57   Pulse (!) 54   Temp 98.2 F (36.8 C) (Oral)   Resp 17   Ht 5\' 5"  (1.651 m)   Wt 59 kg   SpO2 100%   BMI 21.64 kg/m   Physical Exam Vitals and nursing note reviewed.  Constitutional:      General: She is not in acute distress.    Appearance: She is well-developed.  HENT:     Head: Normocephalic and atraumatic.  Eyes:     Conjunctiva/sclera: Conjunctivae normal.  Cardiovascular:     Rate and Rhythm: Normal rate and regular rhythm.     Heart sounds: No murmur heard. Pulmonary:     Effort: Pulmonary effort is normal. No respiratory distress.     Breath sounds: Normal breath sounds.  Abdominal:     Palpations: Abdomen is soft.     Tenderness: There is no  abdominal tenderness.  Musculoskeletal:        General: No swelling.     Cervical back: Neck supple.  Skin:    General: Skin is warm and dry.     Capillary Refill: Capillary refill takes less than 2 seconds.  Neurological:     Mental Status: She is  alert.  Psychiatric:        Mood and Affect: Mood normal.     ED Results and Treatments Labs (all labs ordered are listed, but only abnormal results are displayed) Labs Reviewed  COMPREHENSIVE METABOLIC PANEL - Abnormal; Notable for the following components:      Result Value   BUN 33 (*)    Creatinine, Ser 1.23 (*)    GFR, Estimated 42 (*)    All other components within normal limits  CBC WITH DIFFERENTIAL/PLATELET - Abnormal; Notable for the following components:   RBC 3.35 (*)    Hemoglobin 9.3 (*)    HCT 30.5 (*)    Neutro Abs 1.6 (*)    All other components within normal limits  URINALYSIS, ROUTINE W REFLEX MICROSCOPIC - Abnormal; Notable for the following components:   Color, Urine STRAW (*)    All other components within normal limits  RAPID URINE DRUG SCREEN, HOSP PERFORMED                                                                                                                          Radiology CT Head Wo Contrast  Result Date: 05/13/2023 CLINICAL DATA:  89 year old female with altered mental status. Foul smelling urine. EXAM: CT HEAD WITHOUT CONTRAST TECHNIQUE: Contiguous axial images were obtained from the base of the skull through the vertex without intravenous contrast. RADIATION DOSE REDUCTION: This exam was performed according to the departmental dose-optimization program which includes automated exposure control, adjustment of the mA and/or kV according to patient size and/or use of iterative reconstruction technique. COMPARISON:  Head CT 02/18/2023. FINDINGS: Brain: Stable cerebral volume. No midline shift, mass effect, or evidence of intracranial mass lesion. Ex vacuo appearing ventricular enlargement,  colpocephaly, with suspicion of disproportionate parietal, occipital and mesial temporal lobe atrophy (stable). Patchy superimposed cerebral white matter hypodensity. No chronic cortically based infarct identified. No acute intracranial hemorrhage identified. No cortically based acute infarct identified. Vascular: Extensive calcified atherosclerosis at the skull base. No suspicious intracranial vascular hyperdensity. Skull: Calvarium hyperostosis redemonstrated. No acute osseous abnormality identified. Sinuses/Orbits: Visualized paranasal sinuses and mastoids are stable and well aerated. Other: No acute orbit or scalp soft tissue finding. IMPRESSION: 1. No acute intracranial abnormality. 2. Cerebral Atrophy (ICD10-G31.9), with superimposed mild for age cerebral white matter disease. Electronically Signed   By: Odessa Fleming M.D.   On: 05/13/2023 04:59    Pertinent labs & imaging results that were available during my care of the patient were reviewed by me and considered in my medical decision making (see MDM for details).  Medications Ordered in ED Medications - No data to display  Procedures Procedures  (including critical care time)  Medical Decision Making / ED Course   This patient presents to the ED for concern of confusion, this involves an extensive number of treatment options, and is a complaint that carries with it a high risk of complications and morbidity.  The differential diagnosis includes infection, metabolic/toxic encephalopathy, hypoglycemia, malperfusion, hypoxia, trauma or other intracranial process  MDM: Patient seen emergency room for evaluation of altered mental status.  Physical exam largely unremarkable.  Laboratory evaluation with a hemoglobin of 9.3 which is baseline for the patient, BUN 33, creatinine 1.23 which is improved from baseline.  Urinalysis  unremarkable.  CT head unremarkable.  Patient not displaying any evidence of confusion or altered mental status here in the emergency department.  At this time she does not meet inpatient criteria for admission she is safe for discharge with outpatient follow-up.   Additional history obtained:  -External records from outside source obtained and reviewed including: Chart review including previous notes, labs, imaging, consultation notes   Lab Tests: -I ordered, reviewed, and interpreted labs.   The pertinent results include:   Labs Reviewed  COMPREHENSIVE METABOLIC PANEL - Abnormal; Notable for the following components:      Result Value   BUN 33 (*)    Creatinine, Ser 1.23 (*)    GFR, Estimated 42 (*)    All other components within normal limits  CBC WITH DIFFERENTIAL/PLATELET - Abnormal; Notable for the following components:   RBC 3.35 (*)    Hemoglobin 9.3 (*)    HCT 30.5 (*)    Neutro Abs 1.6 (*)    All other components within normal limits  URINALYSIS, ROUTINE W REFLEX MICROSCOPIC - Abnormal; Notable for the following components:   Color, Urine STRAW (*)    All other components within normal limits  RAPID URINE DRUG SCREEN, HOSP PERFORMED       Imaging Studies ordered: I ordered imaging studies including CT head I independently visualized and interpreted imaging. I agree with the radiologist interpretation   Medicines ordered and prescription drug management: No orders of the defined types were placed in this encounter.   -I have reviewed the patients home medicines and have made adjustments as needed  Critical interventions none   Cardiac Monitoring: The patient was maintained on a cardiac monitor.  I personally viewed and interpreted the cardiac monitored which showed an underlying rhythm of: NSR  Social Determinants of Health:  Factors impacting patients care include: Lives in skilled nursing facility   Reevaluation: After the interventions noted above, I  reevaluated the patient and found that they have :improved  Co morbidities that complicate the patient evaluation  Past Medical History:  Diagnosis Date   Arthritis    Asthma    rarely uses neb   Breast cancer (HCC) 05/25/2016   right breast   Breast cancer of lower-inner quadrant of right female breast (HCC) 01/22/2016   Carpal tunnel syndrome    Coronary artery disease    DES to OM3 90% stenosis 2003, 90% small RCA stenosis.     Dementia (HCC)    Diabetes mellitus    not taken glipizide in over a year, checks surgars daily   History of stomach ulcers    Hyperlipemia    Hypertension    Irregular heart beat    Vascular dementia (HCC)       Dispostion: I considered admission for this patient, but at this time she does not meet inpatient criteria for admission she is  safe for discharge with outpatient follow-up     Final Clinical Impression(s) / ED Diagnoses Final diagnoses:  Transient alteration of awareness     @PCDICTATION @    Glendora Score, MD 05/13/23 [id number]

## 2023-05-13 NOTE — ED Notes (Signed)
Patient transported to CT 

## 2023-05-13 NOTE — ED Triage Notes (Signed)
BIB [facility] from [facility] for increased AMS. Patient was noted to have foul smelling urine and urinary incontinence.  Pt has hx of dementia.  20g IV left AC  142/86 56- HR 100 RA 26 Resp 109 CBG

## 2023-11-07 ENCOUNTER — Other Ambulatory Visit: Payer: Self-pay

## 2023-11-07 ENCOUNTER — Emergency Department (HOSPITAL_COMMUNITY)

## 2023-11-07 ENCOUNTER — Encounter (HOSPITAL_COMMUNITY): Payer: Self-pay | Admitting: Emergency Medicine

## 2023-11-07 ENCOUNTER — Emergency Department (HOSPITAL_COMMUNITY)
Admission: EM | Admit: 2023-11-07 | Discharge: 2023-11-07 | Disposition: A | Discharge status: Skilled Nursing Facility | Source: Home / Self Care | Attending: Emergency Medicine | Admitting: Emergency Medicine

## 2023-11-07 DIAGNOSIS — S0990XA Unspecified injury of head, initial encounter: Secondary | ICD-10-CM | POA: Insufficient documentation

## 2023-11-07 DIAGNOSIS — W19XXXA Unspecified fall, initial encounter: Secondary | ICD-10-CM

## 2023-11-07 DIAGNOSIS — Z85118 Personal history of other malignant neoplasm of bronchus and lung: Secondary | ICD-10-CM | POA: Insufficient documentation

## 2023-11-07 DIAGNOSIS — E119 Type 2 diabetes mellitus without complications: Secondary | ICD-10-CM | POA: Insufficient documentation

## 2023-11-07 DIAGNOSIS — F0390 Unspecified dementia without behavioral disturbance: Secondary | ICD-10-CM | POA: Insufficient documentation

## 2023-11-07 DIAGNOSIS — I10 Essential (primary) hypertension: Secondary | ICD-10-CM | POA: Insufficient documentation

## 2023-11-07 DIAGNOSIS — W1809XA Striking against other object with subsequent fall, initial encounter: Secondary | ICD-10-CM | POA: Insufficient documentation

## 2023-11-07 DIAGNOSIS — Z7982 Long term (current) use of aspirin: Secondary | ICD-10-CM | POA: Insufficient documentation

## 2023-11-07 DIAGNOSIS — Z853 Personal history of malignant neoplasm of breast: Secondary | ICD-10-CM | POA: Insufficient documentation

## 2023-11-07 DIAGNOSIS — G9341 Metabolic encephalopathy: Secondary | ICD-10-CM | POA: Diagnosis not present

## 2023-11-07 DIAGNOSIS — I2510 Atherosclerotic heart disease of native coronary artery without angina pectoris: Secondary | ICD-10-CM | POA: Insufficient documentation

## 2023-11-07 DIAGNOSIS — N390 Urinary tract infection, site not specified: Secondary | ICD-10-CM | POA: Diagnosis not present

## 2023-11-07 LAB — CBG MONITORING, ED: Glucose-Capillary: 72 mg/dL (ref 70–99)

## 2023-11-07 MED ORDER — HALOPERIDOL LACTATE 5 MG/ML IJ SOLN
2.0000 mg | Freq: Once | INTRAMUSCULAR | Status: AC
  Administered 2023-11-07: 2 mg via INTRAMUSCULAR
  Filled 2023-11-07: qty 1

## 2023-11-07 MED ORDER — DIPHENHYDRAMINE HCL 50 MG/ML IJ SOLN
12.5000 mg | Freq: Once | INTRAMUSCULAR | Status: AC
  Administered 2023-11-07: 12.5 mg via INTRAMUSCULAR
  Filled 2023-11-07: qty 1

## 2023-11-07 MED ORDER — LORAZEPAM 2 MG/ML IJ SOLN
1.0000 mg | Freq: Once | INTRAMUSCULAR | Status: AC
  Administered 2023-11-07: 1 mg via INTRAMUSCULAR
  Filled 2023-11-07: qty 1

## 2023-11-07 NOTE — ED Notes (Signed)
 Re attempted to capture EKG and place cardiac monitoring. Pt again became verbally and physically aggressive. Will try attempt number 3 when pt calms down. RN aware of the incident.

## 2023-11-07 NOTE — ED Notes (Addendum)
 Report given to Sujeeta.

## 2023-11-07 NOTE — ED Provider Notes (Signed)
 4:45 PM Assumed care of patient from off-going team. For more details, please see note from same day.  In brief, this is a 89 y.o. female w/ dementia and witnessed fall at facility  Plan/Dispo at time of sign-out & ED Course since sign-out: [ ]  CTH/c-spine now s/p 3rd dose haldol   BP (!) 156/68 (BP Location: Left Arm)   Pulse 67   Temp 97.6 F (36.4 C) (Axillary)   Resp 20   SpO2 97%    ED Course:   Clinical Course as of 11/07/23 2033  Mon Nov 07, 2023  2016 Still awaiting [facility], CT C-spine reads [HN]  2029 CT HEAD WO CONTRAST 1. No acute intracranial abnormality. 2. Generalized cerebral atrophy with chronic white matter small vessel ischemic changes.   [HN]  2031 D/w patient's daughter Laverda Stribling over the phone who was here in the ED earlier today, states her mother was acting at her baseline today after the fall. She is informed of her imaging findings. Patient was otherwise in her NSOH. Patient will be DC'd back to facility.  [HN]    Clinical Course User Index [HN] Merdis Stalling, MD    Dispo: DC ------------------------------- Annita Kindle, MD Emergency Medicine  This note was created using dictation software, which may contain spelling or grammatical errors.   Merdis Stalling, MD 11/07/23 2033

## 2023-11-07 NOTE — ED Provider Notes (Signed)
 [place] EMERGENCY DEPARTMENT AT [facility] Provider Note   CSN: [id number] Arrival date & time: 11/07/23  1191     History  Chief Complaint  Patient presents with   Jennifer Garrett    Jennifer Garrett is a 89 y.o. female.  HPI Patient presents after fall.  Medical history includes DM, CAD, dementia, HLD, HTN, lung cancer, breast cancer.  Due to her dementia, patient is unable to provide any history.  Per staff and nursing facility, they walked into the room.  She was standing and they went to get her dressed.  As they were helping her dress, patient collapsed to the floor.  They did not think that she lost consciousness.  During the fall, she did strike her head on the nightstand.  She was able to stand and ambulate after her fall.    Home Medications Prior to Admission medications   Medication Sig Start Date End Date Taking? Authorizing Provider  aspirin  81 MG chewable tablet Chew 81 mg by mouth daily.    [provider]  donepezil  (ARICEPT ) 10 MG tablet Take 10 mg by mouth daily. 03/14/16   [provider]  olmesartan (BENICAR) 40 MG tablet Take 40 mg by mouth daily.    [provider]  ondansetron  (ZOFRAN ) 4 MG tablet Take 4 mg by mouth every 6 (six) hours as needed for nausea or vomiting. Patient not taking: Reported on 05/13/2023    [provider]  potassium chloride  SA (KLOR-CON ) 20 MEQ tablet Take 20 mEq by mouth daily.    [provider]      Allergies    Lyrica [pregabalin], Feldene [piroxicam], Orudis [ketoprofen], Vibramycin [doxycycline calcium ], and Zestril [lisinopril]    Review of Systems   Review of Systems  Unable to perform ROS: Dementia    Physical Exam Updated Vital Signs BP (!) 156/68 (BP Location: Left Arm)   Pulse 67   Temp 97.6 F (36.4 C) (Axillary)   Resp 20   SpO2 97%  Physical Exam Vitals and nursing note reviewed.  Constitutional:      General: She is not in acute distress.    Appearance:  Normal appearance. She is well-developed. She is not ill-appearing, toxic-appearing or diaphoretic.  HENT:     Head: Normocephalic and atraumatic.     Right Ear: External ear normal.     Left Ear: External ear normal.     Nose: Nose normal.     Mouth/Throat:     Mouth: Mucous membranes are moist.  Eyes:     Extraocular Movements: Extraocular movements intact.     Conjunctiva/sclera: Conjunctivae normal.  Cardiovascular:     Rate and Rhythm: Normal rate and regular rhythm.     Heart sounds: No murmur heard. Pulmonary:     Effort: Pulmonary effort is normal. No respiratory distress.     Breath sounds: Normal breath sounds. No wheezing or rales.  Chest:     Chest wall: No tenderness.  Abdominal:     General: There is no distension.     Palpations: Abdomen is soft.     Tenderness: There is no abdominal tenderness.  Musculoskeletal:        General: No swelling, tenderness or deformity. Normal range of motion.     Cervical back: Normal range of motion and neck supple.  Skin:    General: Skin is warm and dry.     Coloration: Skin is not jaundiced or pale.  Neurological:     General: No  focal deficit present.     Mental Status: She is alert. Mental status is at baseline. She is disoriented.  Psychiatric:        Speech: Speech normal.        Behavior: Behavior is uncooperative and agitated.     ED Results / Procedures / Treatments   Labs (all labs ordered are listed, but only abnormal results are displayed) Labs Reviewed  CBG MONITORING, ED    EKG None  Radiology DG Pelvis Portable Result Date: 11/07/2023 CLINICAL DATA:  fall EXAM: PORTABLE PELVIS 1-2 VIEWS COMPARISON:  October 14, 2022 FINDINGS: Osteopenia. No acute, displaced fracture or dislocation. Multilevel degenerative disc disease of the spine. Soft tissues are unremarkable. IMPRESSION: While no acute, displaced fracture or dislocation was visualized, underlying osteopenia decreases the sensitivity for nondisplaced  fracture using plain radiography. If the patient is unable to bear weight, or there is high clinical suspicion, a follow-up CT should be considered for further evaluation. Electronically Signed   By: Rance Burrows M.D.   On: 11/07/2023 15:21   DG Chest Port 1 View Result Date: 11/07/2023 CLINICAL DATA:  Fall. EXAM: PORTABLE CHEST 1 VIEW COMPARISON:  10/14/2022. FINDINGS: Limited evaluation due to compromised positioning and rotation. Patient's chin obscures evaluation of the upper mediastinum and right upper lung. Cardiac silhouette is grossly within normal limits allowing for rotation and shallow inspiration. Aortic atherosclerosis. No focal consolidation, large pleural effusion, or pneumothorax identified. Surgical clips in the right axilla. Remote left-sided rib fractures. No acute osseous abnormality identified. IMPRESSION: Significantly limited evaluation. No acute abnormality identified within these limitations. Electronically Signed   By: Mannie Seek M.D.   On: 11/07/2023 11:49    Procedures Procedures    Medications Ordered in ED Medications  LORazepam  (ATIVAN ) injection 1 mg (has no administration in time range)  haloperidol  lactate (HALDOL ) injection 2 mg (has no administration in time range)  diphenhydrAMINE  (BENADRYL ) injection 12.5 mg (has no administration in time range)  haloperidol  lactate (HALDOL ) injection 2 mg (2 mg Intramuscular Given 11/07/23 0909)  haloperidol  lactate (HALDOL ) injection 2 mg (2 mg Intramuscular Given 11/07/23 1143)  LORazepam  (ATIVAN ) injection 1 mg (1 mg Intramuscular Given 11/07/23 1143)    ED Course/ Medical Decision Making/ A&P                                 Medical Decision Making Amount and/or Complexity of Data Reviewed Radiology: ordered.  Risk Prescription drug management.   This patient presents to the ED for concern of fall, this involves an extensive number of treatment options, and is a complaint that carries with it a high risk  of complications and morbidity.  The differential diagnosis includes acute injuries   Co morbidities that complicate the patient evaluation  DM, CAD, dementia, HLD, HTN, lung cancer, breast cancer   Additional history obtained:  Additional history obtained from EMS, nursing facility staff External records from outside source obtained and reviewed including EMR   Lab Tests:  I Ordered, and personally interpreted labs.  The pertinent results include: Normal CBG   Imaging Studies ordered:  I ordered imaging studies including x-ray of chest and pelvis, CT scan of head and cervical spine I independently visualized and interpreted imaging which showed no acute findings on x-ray imaging.  CT scans pending at time of signout. I agree with the radiologist interpretation  Problem List / ED Course / Critical interventions / Medication management  Patient presents after a witnessed fall at nursing facility.  Due to her dementia, she is not able to provide any history.  I spoke with staff at her nursing facility states that she was in her normal state of health up until this morning.  While in her room standing, staff went to get her dressed.  At this time, she had what was described as a witnessed collapsed to the floor.  As she fell, she did strike her head on the nightstand.  Staff reports that she did not lose consciousness at any point.  Per chart review and review of her nursing facility paperwork, patient is not on any blood thinners.  On arrival in the ED, patient is awake and alert.  She is resistant to having monitoring equipment placed on her.  She is vigorously moving all extremities.  She has no external evidence of trauma.  She has no areas of tenderness.  Due to her agitation, dose of Haldol  was ordered.  Workup was initiated.  After attempting to get CT imaging, patient was unable to cooperate and unable to hold still.  Additional dose of Haldol  was ordered in addition to some Ativan .   X-ray imaging was able to be obtained, which did not show acute findings.  On reattempt of CT scan, patient again uncooperative.  Further medications were ordered.  Plan will be to reattempt CT imaging given nursing facility report of head injury.  Care of patient was signed out to oncoming ED provider. I ordered medication including: Haldol  and Ativan  for agitation Reevaluation of the patient after these medicines showed that the patient improved I have reviewed the patients home medicines and have made adjustments as needed  Social Determinants of Health:  Has dementia and resides in nursing facility        Final Clinical Impression(s) / ED Diagnoses Final diagnoses:  Fall, initial encounter    Rx / DC Orders ED Discharge Orders     None         Iva Mariner, MD 11/07/23 [phone]

## 2023-11-07 NOTE — ED Triage Notes (Signed)
 Pt arriving from [facility] via [place] for fall. Staff unable to provide accurate account of how pt fell. Pt reports she did not fall and denies any pain at this time. Pt does have dementia but is able to communicate her needs.

## 2023-11-07 NOTE — Discharge Instructions (Signed)
 Thank you for coming to [facility] Emergency Department. You were seen for a fall and minor head injury. We did an exam, labs, and imaging, and these showed no acute findings.  Please follow up with your primary care provider within 1 week.   Do not hesitate to return to the ED or call 911 if you experience: -Worsening symptoms -Lightheadedness, passing out -Fevers/chills -Anything else that concerns you

## 2023-11-07 NOTE — ED Notes (Signed)
 Attempted to place cardiac monitor leads, and capture EKG. Pt became physically aggressive, punching, biting, and swinging cords. Will we attempt EKG a few moments after pt has calmed down. RN aware and in the pt rm when happened.

## 2023-11-07 NOTE — ED Notes (Signed)
 [facility] contacted

## 2023-11-08 ENCOUNTER — Emergency Department (HOSPITAL_COMMUNITY)

## 2023-11-08 ENCOUNTER — Inpatient Hospital Stay (HOSPITAL_COMMUNITY)
Admission: EM | Admit: 2023-11-08 | Discharge: 2023-11-14 | DRG: 689 | Disposition: A | Discharge status: Skilled Nursing Facility | Source: Skilled Nursing Facility | Attending: Internal Medicine | Admitting: Internal Medicine

## 2023-11-08 ENCOUNTER — Observation Stay (HOSPITAL_COMMUNITY)

## 2023-11-08 ENCOUNTER — Encounter (HOSPITAL_COMMUNITY): Payer: Self-pay

## 2023-11-08 ENCOUNTER — Other Ambulatory Visit: Payer: Self-pay

## 2023-11-08 DIAGNOSIS — C50311 Malignant neoplasm of lower-inner quadrant of right female breast: Secondary | ICD-10-CM

## 2023-11-08 DIAGNOSIS — E785 Hyperlipidemia, unspecified: Secondary | ICD-10-CM | POA: Diagnosis present

## 2023-11-08 DIAGNOSIS — Z171 Estrogen receptor negative status [ER-]: Secondary | ICD-10-CM

## 2023-11-08 DIAGNOSIS — Z87891 Personal history of nicotine dependence: Secondary | ICD-10-CM

## 2023-11-08 DIAGNOSIS — Z85118 Personal history of other malignant neoplasm of bronchus and lung: Secondary | ICD-10-CM

## 2023-11-08 DIAGNOSIS — Z853 Personal history of malignant neoplasm of breast: Secondary | ICD-10-CM

## 2023-11-08 DIAGNOSIS — I2510 Atherosclerotic heart disease of native coronary artery without angina pectoris: Secondary | ICD-10-CM | POA: Diagnosis present

## 2023-11-08 DIAGNOSIS — G9341 Metabolic encephalopathy: Secondary | ICD-10-CM | POA: Diagnosis not present

## 2023-11-08 DIAGNOSIS — N1831 Chronic kidney disease, stage 3a: Secondary | ICD-10-CM | POA: Diagnosis present

## 2023-11-08 DIAGNOSIS — D649 Anemia, unspecified: Secondary | ICD-10-CM | POA: Diagnosis present

## 2023-11-08 DIAGNOSIS — Z1152 Encounter for screening for COVID-19: Secondary | ICD-10-CM

## 2023-11-08 DIAGNOSIS — Z955 Presence of coronary angioplasty implant and graft: Secondary | ICD-10-CM

## 2023-11-08 DIAGNOSIS — J45909 Unspecified asthma, uncomplicated: Secondary | ICD-10-CM | POA: Diagnosis present

## 2023-11-08 DIAGNOSIS — F01518 Vascular dementia, unspecified severity, with other behavioral disturbance: Secondary | ICD-10-CM | POA: Diagnosis not present

## 2023-11-08 DIAGNOSIS — R7989 Other specified abnormal findings of blood chemistry: Secondary | ICD-10-CM | POA: Diagnosis present

## 2023-11-08 DIAGNOSIS — F0150 Vascular dementia without behavioral disturbance: Secondary | ICD-10-CM | POA: Diagnosis present

## 2023-11-08 DIAGNOSIS — N289 Disorder of kidney and ureter, unspecified: Secondary | ICD-10-CM | POA: Diagnosis not present

## 2023-11-08 DIAGNOSIS — E041 Nontoxic single thyroid nodule: Secondary | ICD-10-CM | POA: Diagnosis present

## 2023-11-08 DIAGNOSIS — I129 Hypertensive chronic kidney disease with stage 1 through stage 4 chronic kidney disease, or unspecified chronic kidney disease: Secondary | ICD-10-CM | POA: Diagnosis present

## 2023-11-08 DIAGNOSIS — Y92122 Bedroom in nursing home as the place of occurrence of the external cause: Secondary | ICD-10-CM

## 2023-11-08 DIAGNOSIS — R634 Abnormal weight loss: Secondary | ICD-10-CM | POA: Diagnosis present

## 2023-11-08 DIAGNOSIS — Z7982 Long term (current) use of aspirin: Secondary | ICD-10-CM

## 2023-11-08 DIAGNOSIS — Z9011 Acquired absence of right breast and nipple: Secondary | ICD-10-CM

## 2023-11-08 DIAGNOSIS — R9431 Abnormal electrocardiogram [ECG] [EKG]: Secondary | ICD-10-CM | POA: Diagnosis present

## 2023-11-08 DIAGNOSIS — E1122 Type 2 diabetes mellitus with diabetic chronic kidney disease: Secondary | ICD-10-CM | POA: Diagnosis present

## 2023-11-08 DIAGNOSIS — F01511 Vascular dementia, unspecified severity, with agitation: Secondary | ICD-10-CM | POA: Diagnosis present

## 2023-11-08 DIAGNOSIS — S0990XA Unspecified injury of head, initial encounter: Secondary | ICD-10-CM | POA: Diagnosis present

## 2023-11-08 DIAGNOSIS — W1839XA Other fall on same level, initial encounter: Secondary | ICD-10-CM | POA: Diagnosis present

## 2023-11-08 DIAGNOSIS — R4182 Altered mental status, unspecified: Principal | ICD-10-CM

## 2023-11-08 DIAGNOSIS — N390 Urinary tract infection, site not specified: Principal | ICD-10-CM | POA: Diagnosis present

## 2023-11-08 DIAGNOSIS — D696 Thrombocytopenia, unspecified: Secondary | ICD-10-CM | POA: Diagnosis present

## 2023-11-08 LAB — COMPREHENSIVE METABOLIC PANEL WITH GFR
ALT: 10 U/L (ref 0–44)
AST: 34 U/L (ref 15–41)
Albumin: 2.8 g/dL — ABNORMAL LOW (ref 3.5–5.0)
Alkaline Phosphatase: 49 U/L (ref 38–126)
Anion gap: 11 (ref 5–15)
BUN: 26 mg/dL — ABNORMAL HIGH (ref 8–23)
CO2: 17 mmol/L — ABNORMAL LOW (ref 22–32)
Calcium: 8.2 mg/dL — ABNORMAL LOW (ref 8.9–10.3)
Chloride: 112 mmol/L — ABNORMAL HIGH (ref 98–111)
Creatinine, Ser: 1.14 mg/dL — ABNORMAL HIGH (ref 0.44–1.00)
GFR, Estimated: 46 mL/min — ABNORMAL LOW (ref >60)
Glucose, Bld: 102 mg/dL — ABNORMAL HIGH (ref 70–99)
Potassium: 4.6 mmol/L (ref 3.5–5.1)
Sodium: 140 mmol/L (ref 135–145)
Total Bilirubin: 0.3 mg/dL (ref 0.0–1.2)
Total Protein: 5.7 g/dL — ABNORMAL LOW (ref 6.5–8.1)

## 2023-11-08 LAB — RESP PANEL BY RT-PCR (RSV, FLU A&B, COVID)  RVPGX2
Influenza A by PCR: NEGATIVE
Influenza B by PCR: NEGATIVE
Resp Syncytial Virus by PCR: NEGATIVE
SARS Coronavirus 2 by RT PCR: NEGATIVE

## 2023-11-08 LAB — CBC WITH DIFFERENTIAL/PLATELET
Abs Immature Granulocytes: 0.02 10*3/uL (ref 0.00–0.07)
Basophils Absolute: 0 10*3/uL (ref 0.0–0.1)
Basophils Relative: 1 %
Eosinophils Absolute: 0 10*3/uL (ref 0.0–0.5)
Eosinophils Relative: 0 %
HCT: 30.2 % — ABNORMAL LOW (ref 36.0–46.0)
Hemoglobin: 9.5 g/dL — ABNORMAL LOW (ref 12.0–15.0)
Immature Granulocytes: 0 %
Lymphocytes Relative: 17 %
Lymphs Abs: 0.9 10*3/uL (ref 0.7–4.0)
MCH: 29.1 pg (ref 26.0–34.0)
MCHC: 31.5 g/dL (ref 30.0–36.0)
MCV: 92.4 fL (ref 80.0–100.0)
Monocytes Absolute: 0.5 10*3/uL (ref 0.1–1.0)
Monocytes Relative: 10 %
Neutro Abs: 3.8 10*3/uL (ref 1.7–7.7)
Neutrophils Relative %: 72 %
Platelets: 134 10*3/uL — ABNORMAL LOW (ref 150–400)
RBC: 3.27 MIL/uL — ABNORMAL LOW (ref 3.87–5.11)
RDW: 12.3 % (ref 11.5–15.5)
WBC: 5.3 10*3/uL (ref 4.0–10.5)
nRBC: 0 % (ref 0.0–0.2)

## 2023-11-08 LAB — TROPONIN I (HIGH SENSITIVITY)
Troponin I (High Sensitivity): 26 ng/L — ABNORMAL HIGH (ref <18)
Troponin I (High Sensitivity): 52 ng/L — ABNORMAL HIGH (ref <18)

## 2023-11-08 MED ORDER — ACETAMINOPHEN 650 MG RE SUPP: 650.0000 mg | Freq: Four times a day (QID) | RECTAL | Status: DC | PRN

## 2023-11-08 MED ORDER — STERILE WATER FOR INJECTION IJ SOLN
INTRAMUSCULAR | Status: AC
  Administered 2023-11-08: 10 mL
  Filled 2023-11-08: qty 10

## 2023-11-08 MED ORDER — ACETAMINOPHEN 325 MG PO TABS
650.0000 mg | ORAL_TABLET | Freq: Four times a day (QID) | ORAL | Status: DC | PRN
  Administered 2023-11-13: 650 mg via ORAL
  Filled 2023-11-08: qty 2

## 2023-11-08 MED ORDER — ONDANSETRON HCL 4 MG/2ML IJ SOLN: 4.0000 mg | Freq: Four times a day (QID) | INTRAMUSCULAR | Status: DC | PRN

## 2023-11-08 MED ORDER — SODIUM CHLORIDE 0.45 % IV SOLN: INTRAVENOUS | Status: AC

## 2023-11-08 MED ORDER — ZIPRASIDONE MESYLATE 20 MG IM SOLR
20.0000 mg | Freq: Once | INTRAMUSCULAR | Status: AC | PRN
  Administered 2023-11-08: 20 mg via INTRAMUSCULAR
  Filled 2023-11-08: qty 20

## 2023-11-08 MED ORDER — HALOPERIDOL LACTATE 5 MG/ML IJ SOLN: 5.0000 mg | Freq: Once | INTRAMUSCULAR | Status: DC | PRN

## 2023-11-08 MED ORDER — DONEPEZIL HCL 10 MG PO TABS
10.0000 mg | ORAL_TABLET | Freq: Every day | ORAL | Status: DC
  Administered 2023-11-09 – 2023-11-14 (×6): 10 mg via ORAL
  Filled 2023-11-08 (×7): qty 1

## 2023-11-08 MED ORDER — ENOXAPARIN SODIUM 40 MG/0.4ML IJ SOSY
40.0000 mg | PREFILLED_SYRINGE | INTRAMUSCULAR | Status: DC
  Administered 2023-11-09 – 2023-11-12 (×4): 40 mg via SUBCUTANEOUS
  Filled 2023-11-08 (×5): qty 0.4

## 2023-11-08 MED ORDER — ONDANSETRON HCL 4 MG PO TABS: 4.0000 mg | ORAL_TABLET | Freq: Four times a day (QID) | ORAL | Status: DC | PRN

## 2023-11-08 MED ORDER — SODIUM CHLORIDE 0.9 % IV SOLN: Freq: Once | INTRAVENOUS | Status: AC

## 2023-11-08 MED ORDER — DIVALPROEX SODIUM 125 MG PO DR TAB
125.0000 mg | DELAYED_RELEASE_TABLET | Freq: Two times a day (BID) | ORAL | Status: DC
  Administered 2023-11-09: 125 mg via ORAL
  Filled 2023-11-08 (×3): qty 1

## 2023-11-08 MED ORDER — IOHEXOL 350 MG/ML SOLN
75.0000 mL | Freq: Once | INTRAVENOUS | Status: AC | PRN
  Administered 2023-11-08: 75 mL via INTRAVENOUS

## 2023-11-08 MED ORDER — LORAZEPAM 2 MG/ML IJ SOLN: 0.5000 mg | Freq: Once | INTRAMUSCULAR | Status: DC

## 2023-11-08 NOTE — Progress Notes (Signed)
 [facility] night cross cover note:   I was notified by RN that this patient, who is scheduled to go for MRI this evening, is agitated, confused, combative, pulling at leads and lines, having successfully removed her peripheral IV.  Currently no IV access.  I subsequently ordered Geodon  20 mg IM x 1 dose prn for agitation as well as Haldol  5 mg IM x 1 dose prn for agitation refractory to Geodon .   Additionally, RN conveys that patient's second troponin was found to be 52, up slightly from her initial mildly elevated troponin of 26.  Initial EKG showed nonspecific changes, and no evidence of STEMI.  And her agitated confused state, the patient is unable to give a she has any acute symptoms at this time.  Most recent vital signs appear stable including afebrile; heart rates in the 60s to 70s; most recent systolic blood pressures in the 130s, with oxygen saturation 100% on room air.  Once the patient is calmer, less agitated, will attempt to pursue EKG.  Additionally, I have ordered further trending of troponin as well as an echocardiogram to occur tomorrow.     Camelia Cavalier, DO Hospitalist

## 2023-11-08 NOTE — ED Notes (Signed)
 Advised daughter that we needed to do an in and out cath for a urine, daughter said she didn't want us  to do it at this time because the patient gets upset and aggressive and wanted to know if I could  ask for some antibiotics and treat for a possible UTI, Reba Camper, MD notified.

## 2023-11-08 NOTE — ED Triage Notes (Signed)
 PT BIB EMS from facility for altered mental status, was seen at [facility] yesterday for a fall, per facility she's not as verbal as she usually is.  Vascular Demntia at baseline. Pupil round and reactive.  Able to follow some commands.  BP 144/56 HR 72 O2 99 RR 20 CBG 120 20 g L AC

## 2023-11-08 NOTE — H&P (Signed)
 [facility] History and Physical  Jennifer Garrett ZOX:[id number] DOB: 12-04-1931 DOA: 11/08/2023   PCP: Charle Congo, MD  Specialists: Previously followed by Dr. Charolett Copes with medical oncology  Chief Complaint: Altered mental status  HPI: Jennifer Garrett is a 89 y.o. female with a past medical history of vascular dementia, stage IV breast cancer in remission for the past 3 years, essential hypertension who lives in a memory care unit.  She was sent to [facility] emergency department on 4/21 after she sustained a fall at her memory care unit.  She underwent imaging studies which did not reveal any trauma.  She was thought to be back to her baseline and was sent back to her facility.  Over the past 24 hours she has been more lethargic than usual.  This is quite unlike her according to the daughter who is at the bedside.  Patient is usually quite verbal and does not let others examine her.  She can get argumentative as well.  So this is a definite change from her baseline.  Patient is currently asleep.  She does move a bit in the bed when her name is called and with tactile stimuli but does not really respond to questions.  History is quite limited.  In the emergency department she was noted to be afebrile.  Vital signs were otherwise stable.  Blood work did not suggest any infection.  COVID-19 RSV and influenza PCR was negative.  CT head did not show any acute findings.  Chest x-ray did not show any acute findings.  UA is pending.  Home Medications: Prior to Admission medications   Medication Sig Start Date End Date Taking? Authorizing Provider  aspirin  81 MG chewable tablet Chew 81 mg by mouth daily.   Yes [provider]  divalproex  (DEPAKOTE ) 125 MG DR tablet Take 125 mg by mouth 2 (two) times daily. 10/12/23  Yes [provider]  donepezil  (ARICEPT ) 10 MG tablet Take 10 mg by mouth daily. 03/14/16  Yes [provider]  LORazepam  (ATIVAN ) 0.5 MG tablet  Take 0.5 mg by mouth daily as needed (agitation). 07/11/23  Yes [provider]  lovastatin (MEVACOR) 40 MG tablet Take 40 mg by mouth daily. 10/19/23  Yes [provider]  olmesartan (BENICAR) 40 MG tablet Take 40 mg by mouth daily.   Yes [provider]  potassium chloride  SA (KLOR-CON ) 20 MEQ tablet Take 20 mEq by mouth daily.   Yes [provider]    Allergies:  Allergies  Allergen Reactions   Lyrica [Pregabalin] Swelling    Not listed on the MAR   Feldene [Piroxicam] Rash    Not listed on the MAR    Orudis [Ketoprofen] Rash    Not listed on the [facility]    Vibramycin [Doxycycline Calcium ] Hives and Rash    Not listed on the [facility]    Zestril [Lisinopril] Other (See Comments)    Light sensitivity   Not listed on the [facility]     Past Medical History: Past Medical History:  Diagnosis Date   Arthritis    Asthma    rarely uses neb   Breast cancer (HCC) 05/25/2016   right breast   Breast cancer of lower-inner quadrant of right female breast (HCC) 01/22/2016   Carpal tunnel syndrome    Coronary artery disease    DES to OM3 90% stenosis 2003, 90% small RCA stenosis.     Dementia (HCC)    Diabetes mellitus    not  taken glipizide  in over a year, checks surgars daily   History of stomach ulcers    Hyperlipemia    Hypertension    Irregular heart beat    Vascular dementia [facility])     Past Surgical History:  Procedure Laterality Date   ABDOMINAL HYSTERECTOMY     CARDIAC CATHETERIZATION  2003   stent-   CARPAL TUNNEL RELEASE  03/29/2012   Procedure: CARPAL TUNNEL RELEASE;  Surgeon: Kemp Patter, MD;  Location: [facility];  Service: Orthopedics;  Laterality: Left;   EYE SURGERY     catracts   LESION EXCISION WITH COMPLEX REPAIR Right 05/25/2016   Procedure: complex repair of 25cm wound;  Surgeon: Alger Infield, MD;  Location: [facility] OR;  Service: Plastics;  Laterality: Right;   MASTECTOMY     right   MASTECTOMY MODIFIED RADICAL Right  05/25/2016   Procedure: RIGHT MASTECTOMY MODIFIED RADICAL;  Surgeon: Boyce Byes, MD;  Location: [facility] OR;  Service: General;  Laterality: Right;   PUNCH BIOPSY OF SKIN Left 04/04/2017   Procedure: PUNCH BIOPSY OF LEFT BREAST SKIN;  Surgeon: Boyce Byes, MD;  Location: [facility];  Service: General;  Laterality: Left;   RADIOACTIVE SEED GUIDED AXILLARY SENTINEL LYMPH NODE Left 04/04/2017   Procedure: RADIOACTIVE SEED TARGETED LEFT AXILLARY LYMPH NODE EXCISION;  Surgeon: Boyce Byes, MD;  Location: [facility];  Service: General;  Laterality: Left;   SHOULDER ARTHROSCOPY     right and left   TRIGGER FINGER RELEASE  03/29/2012   Procedure: RELEASE TRIGGER FINGER/A-1 PULLEY;  Surgeon: Kemp Patter, MD;  Location: [facility];  Service: Orthopedics;  Laterality: Left;    Social History: Remote history of smoking but none in the last 30 years.  No history of alcoholism.  Lives in a memory care unit.  Able to ambulate independently.  Family History:  Family History  Problem Relation Age of Onset   Heart disease Mother        No details      Review of Systems -unable to do due to her altered mental status  Physical Examination  Vitals:   11/08/23 1058 11/08/23 1100 11/08/23 1130 11/08/23 1400  BP:  (!) 154/50 (!) 140/68 (!) 175/69  Pulse:  67 (!) 58 61  Resp:  20 12 16   Temp: 98.1 F (36.7 C)     TempSrc: Oral     SpO2:  100% 100% 100%    BP (!) 175/69   Pulse 61   Temp 98.1 F (36.7 C) (Oral)   Resp 16   SpO2 100%   General appearance: Poorly responsive Head: Normocephalic, without obvious abnormality, atraumatic Eyes: conjunctivae/corneas clear.  Pupils are equal. Neck: no adenopathy, no carotid bruit, no JVD, supple, symmetrical, trachea midline, and thyroid not enlarged, symmetric, no tenderness/mass/nodules Resp: clear to auscultation bilaterally Cardio: S1-S2 is normal regular.  Systolic murmur appreciated over the  precordium. GI: soft, non-tender; bowel sounds normal; no masses,  no organomegaly Extremities: extremities normal, atraumatic, no cyanosis or edema Pulses: 2+ and symmetric Skin: Skin color, texture, turgor normal. No rashes or lesions Lymph nodes: Cervical, supraclavicular, and axillary nodes normal. Neurologic: Unable to fully assess.   Labs on Admission: I have personally reviewed following labs and imaging studies  CBC: Recent Labs  Lab 11/08/23 1106  WBC 5.3  NEUTROABS 3.8  HGB 9.5*  HCT 30.2*  MCV 92.4  PLT 134*   Basic Metabolic Panel: Recent Labs  Lab 11/08/23 1106  NA 140  K 4.6  CL 112*  CO2 17*  GLUCOSE 102*  BUN 26*  CREATININE 1.14*  CALCIUM  8.2*   GFR: CrCl cannot be calculated (Unknown ideal weight.). Liver Function Tests: Recent Labs  Lab 11/08/23 1106  AST 34  ALT 10  ALKPHOS 49  BILITOT 0.3  PROT 5.7*  ALBUMIN 2.8*    CBG: Recent Labs  Lab 11/07/23 1038  GLUCAP 72     Radiological Exams on Admission: CT Head Wo Contrast Result Date: 11/08/2023 CLINICAL DATA:  Altered mental status. EXAM: CT HEAD WITHOUT CONTRAST TECHNIQUE: Contiguous axial images were obtained from the base of the skull through the vertex without intravenous contrast. RADIATION DOSE REDUCTION: This exam was performed according to the departmental dose-optimization program which includes automated exposure control, adjustment of the mA and/or kV according to patient size and/or use of iterative reconstruction technique. COMPARISON:  Head CT dated 11/07/2023. FINDINGS: Brain: Moderate age-related atrophy and chronic microvascular ischemic changes. There is no acute intracranial hemorrhage. No mass effect or midline shift. No extra-axial fluid collection. Vascular: No hyperdense vessel or unexpected calcification. Skull: Normal. Negative for fracture or focal lesion. Sinuses/Orbits: No acute finding. Other: None IMPRESSION: 1. No acute intracranial pathology. 2. Moderate  age-related atrophy and chronic microvascular ischemic changes. Electronically Signed   By: Angus Bark M.D.   On: 11/08/2023 13:38   DG Chest Port 1 View Result Date: 11/08/2023 CLINICAL DATA:  Shortness of breath. EXAM: PORTABLE CHEST 1 VIEW COMPARISON:  November 07, 2023. FINDINGS: The heart size and mediastinal contours are within normal limits. Both lungs are clear. The visualized skeletal structures are unremarkable. IMPRESSION: No active disease. Electronically Signed   By: Rosalene Colon M.D.   On: 11/08/2023 12:57   CT CERVICAL SPINE WO CONTRAST Result Date: 11/07/2023 CLINICAL DATA:  Status post fall. EXAM: CT CERVICAL SPINE WITHOUT CONTRAST TECHNIQUE: Multidetector CT imaging of the cervical spine was performed without intravenous contrast. Multiplanar CT image reconstructions were also generated. RADIATION DOSE REDUCTION: This exam was performed according to the departmental dose-optimization program which includes automated exposure control, adjustment of the mA and/or kV according to patient size and/or use of iterative reconstruction technique. COMPARISON:  February 18, 2023 FINDINGS: Alignment: Normal. Skull base and vertebrae: No acute fracture. No primary bone lesion or focal pathologic process. Soft tissues and spinal canal: No prevertebral fluid or swelling. No visible canal hematoma. Disc levels: Mild multilevel endplate sclerosis is seen throughout the cervical spine. Mild anterior osteophyte formation and posterior bony spurring are also noted at the level of C5-C6. Mild to moderate severity multilevel intervertebral disc space narrowing is seen. This is most prominent at the level of C5-C6. Moderate to marked severity bilateral multilevel facet joint hypertrophy is noted. Upper chest: There is evidence of paraseptal and centrilobular emphysematous lung disease. Other: Large bilateral thyroid nodules are seen. IMPRESSION: 1. No acute cervical spine fracture or subluxation. 2. Multilevel  degenerative changes, most prominent at the level of C5-C6. Electronically Signed   By: Virgle Grime M.D.   On: 11/07/2023 20:24   CT HEAD WO CONTRAST Result Date: 11/07/2023 CLINICAL DATA:  Status post fall. EXAM: CT HEAD WITHOUT CONTRAST TECHNIQUE: Contiguous axial images were obtained from the base of the skull through the vertex without intravenous contrast. RADIATION DOSE REDUCTION: This exam was performed according to the departmental dose-optimization program which includes automated exposure control, adjustment of the mA and/or kV according to patient size and/or use of iterative reconstruction technique. COMPARISON:  May 13, 2023 FINDINGS: Brain: There is generalized cerebral atrophy with widening of the extra-axial spaces and ventricular dilatation. There are areas of decreased attenuation within the white matter tracts of the supratentorial brain, consistent with microvascular disease changes. Vascular: No hyperdense vessel or unexpected calcification. Skull: Normal. Negative for fracture or focal lesion. Sinuses/Orbits: No acute finding. Other: None. IMPRESSION: 1. No acute intracranial abnormality. 2. Generalized cerebral atrophy with chronic white matter small vessel ischemic changes. Electronically Signed   By: Virgle Grime M.D.   On: 11/07/2023 20:19   DG Pelvis Portable Result Date: 11/07/2023 CLINICAL DATA:  fall EXAM: PORTABLE PELVIS 1-2 VIEWS COMPARISON:  October 14, 2022 FINDINGS: Osteopenia. No acute, displaced fracture or dislocation. Multilevel degenerative disc disease of the spine. Soft tissues are unremarkable. IMPRESSION: While no acute, displaced fracture or dislocation was visualized, underlying osteopenia decreases the sensitivity for nondisplaced fracture using plain radiography. If the patient is unable to bear weight, or there is high clinical suspicion, a follow-up CT should be considered for further evaluation. Electronically Signed   By: Rance Burrows M.D.    On: 11/07/2023 15:21   DG Chest Port 1 View Result Date: 11/07/2023 CLINICAL DATA:  Fall. EXAM: PORTABLE CHEST 1 VIEW COMPARISON:  10/14/2022. FINDINGS: Limited evaluation due to compromised positioning and rotation. Patient's chin obscures evaluation of the upper mediastinum and right upper lung. Cardiac silhouette is grossly within normal limits allowing for rotation and shallow inspiration. Aortic atherosclerosis. No focal consolidation, large pleural effusion, or pneumothorax identified. Surgical clips in the right axilla. Remote left-sided rib fractures. No acute osseous abnormality identified. IMPRESSION: Significantly limited evaluation. No acute abnormality identified within these limitations. Electronically Signed   By: Mannie Seek M.D.   On: 11/07/2023 11:49    My interpretation of Electrocardiogram: Sinus rhythm in the 70s.  Left axis deviation.  Nonspecific T wave changes.  Nonspecific ST changes.   Problem List  Principal Problem:   Acute metabolic encephalopathy Active Problems:   Malignant neoplasm of lower-inner quadrant of right breast of female, estrogen receptor negative (HCC)   Vascular dementia (HCC)   Assessment: This is a 89 year old African-American female with past medical history as stated earlier who presents with altered mental status.  Etiology is unclear.  UTI needs to be ruled out.  Neurological etiology needs to be ruled out.  Plan:  #1. Acute metabolic encephalopathy: Proceed with MRI brain.  UA is pending.  WBC noted to be normal.  She is afebrile.  Do not suspect meningitis at this time.  Check TSH B12 folic acid levels.  #2.  History of breast cancer in remission/weight loss: Patient's daughter does report weight loss over the last several months despite good appetite.  Last seen by medical oncology in 2021.  May not be unreasonable to do CT scan of the chest abdomen pelvis to see if there is any recurrence of her cancer.  #3.  Normocytic  anemia/thrombocytopenia: No evidence of overt bleeding.  Old labs reviewed.  Hemoglobin seems to be close to her baseline.  Anemia panel.  #4. Chronic kidney disease stage IIIa: Renal function close to baseline.  Monitor urine output.  Check UA.  #5.  Vascular dementia: Noted to be on Depakote  and Aricept  prior to admission.  Based on report is appears the patient occasionally has behavioral issues.  Continue Depakote .  Swallow evaluation  #6.  Abnormal EKG: Nonspecific changes noted on EKG.  Troponin is only mildly elevated.  Repeat troponin level is pending.   DVT  Prophylaxis: Lovenox  Code Status: CODE STATUS discussed in detail with patient's daughter.  She would like for the patient to be full code for now. Family Communication: Discussed with daughter Disposition: Hopefully back to memory care unit when improved Consults called: None yet Admission Status: Status is: Observation The patient remains OBS appropriate and will d/c before 2 midnights.    Severity of Illness: The appropriate patient status for this patient is OBSERVATION. Observation status is judged to be reasonable and necessary in order to provide the required intensity of service to ensure the patient's safety. The patient's presenting symptoms, physical exam findings, and initial radiographic and laboratory data in the context of their medical condition is felt to place them at decreased risk for further clinical deterioration. Furthermore, it is anticipated that the patient will be medically stable for discharge from the hospital within 2 midnights of admission.    Further management decisions will depend on results of further testing and patient's response to treatment.   Brydan Downard  [facility] Pager on [email or web address]  11/08/2023, 2:54 PM

## 2023-11-08 NOTE — ED Notes (Signed)
 Patient refusing many nursing activities such as temp measuring and lovenox  injection. She becomes very agitated and combative

## 2023-11-08 NOTE — Progress Notes (Signed)
 Pt received on the floor, Disoriented x4/ doesn't follow commands, pulled IV out within the couple minutes of being on the floor. Will not keep gown on. Pt is combative.

## 2023-11-08 NOTE — Progress Notes (Signed)
 Able to get vital signs by holding pts hand. Unable to get temperature.   Tried to put tele monitor on pt, she was pushing my arms away and pulling cords off. Left tele monitor in the room.

## 2023-11-08 NOTE — Evaluation (Signed)
 Clinical/Bedside Swallow Evaluation Patient Details  Name: Jennifer Garrett MRN: [id number] Date of Birth: [birth year]  Today's Date: 11/08/2023 Time: SLP Start Time (ACUTE ONLY): 1615 SLP Stop Time (ACUTE ONLY): 1625 SLP Time Calculation (min) (ACUTE ONLY): 10 min  Past Medical History:  Past Medical History:  Diagnosis Date   Arthritis    Asthma    rarely uses neb   Breast cancer (HCC) 05/25/2016   right breast   Breast cancer of lower-inner quadrant of right female breast (HCC) 01/22/2016   Carpal tunnel syndrome    Coronary artery disease    DES to OM3 90% stenosis 2003, 90% small RCA stenosis.     Dementia (HCC)    Diabetes mellitus    not taken glipizide  in over a year, checks surgars daily   History of stomach ulcers    Hyperlipemia    Hypertension    Irregular heart beat    Vascular dementia [facility])    Past Surgical History:  Past Surgical History:  Procedure Laterality Date   ABDOMINAL HYSTERECTOMY     CARDIAC CATHETERIZATION  2003   stent-   CARPAL TUNNEL RELEASE  03/29/2012   Procedure: CARPAL TUNNEL RELEASE;  Surgeon: Kemp Patter, MD;  Location: [facility];  Service: Orthopedics;  Laterality: Left;   EYE SURGERY     catracts   LESION EXCISION WITH COMPLEX REPAIR Right 05/25/2016   Procedure: complex repair of 25cm wound;  Surgeon: Alger Infield, MD;  Location: [facility];  Service: Plastics;  Laterality: Right;   MASTECTOMY     right   MASTECTOMY MODIFIED RADICAL Right 05/25/2016   Procedure: RIGHT MASTECTOMY MODIFIED RADICAL;  Surgeon: Boyce Byes, MD;  Location: [facility];  Service: General;  Laterality: Right;   PUNCH BIOPSY OF SKIN Left 04/04/2017   Procedure: PUNCH BIOPSY OF LEFT BREAST SKIN;  Surgeon: Boyce Byes, MD;  Location: [facility];  Service: General;  Laterality: Left;   RADIOACTIVE SEED GUIDED AXILLARY SENTINEL LYMPH NODE Left 04/04/2017   Procedure: RADIOACTIVE SEED TARGETED LEFT AXILLARY LYMPH NODE EXCISION;  Surgeon:  Boyce Byes, MD;  Location: [facility];  Service: General;  Laterality: Left;   SHOULDER ARTHROSCOPY     right and left   TRIGGER FINGER RELEASE  03/29/2012   Procedure: RELEASE TRIGGER FINGER/A-1 PULLEY;  Surgeon: Kemp Patter, MD;  Location: [facility];  Service: Orthopedics;  Laterality: Left;   HPI:  Patient is an 89 y.o. female with PMH: vascular dementia (lives at memory care ALF), stage IV breast cancer in remission past 3 years, essential HTN. She presented to [facility] ED on 11/07/23  after a fall. Imaging did not reveal any trauma. She was sent back to her ALF but then over the next 24 hours, she became more lethargic than usual. She returned to ED, at [facility] on 11/08/23. Vital signs stable, blood  work not suggesting of infection, afebrile, CT head negative for acute findings, CXR negative for acute findings, UA pending. She has been NPO and has been refusing interventions.    Assessment / Plan / Recommendation  Clinical Impression  Patient is presenting with what appears to be a cognitive-based dysphagia at this time. She was alert and relatively calm when SLP talking to her but she is fidgeting with clothing and blankets and talking about muttering about needing to leave, etc. She seemed receptive to some PO's, telling SLP that she had not had anything to eat, but when SLP presented spoon  sip of juice to her mouth, she only partially accepted it into oral cavity and proceeded to close her lips together and pull away. She refused all other PO's offered. SLP called patient's daughter on the phone after evaluation and she reported that at baseline, patient is "lively", talks to people and is able to feed herself. Daughter denied patient having any difficulty with PO intake and that she had eaten breakfast this morning. Daughter told SLP that patient is far off her basline at this time. Daughter plans to come visit before work tomorrow morning and SLP will plan to meet her  at patient's room. Daughter mentioned bringing some snacks for patient this evening but SLP did educate her about patient's NPO status and she was understanding. SLP Visit Diagnosis: Dysphagia, unspecified (R13.10)    Aspiration Risk  Risk for inadequate nutrition/hydration;Mild aspiration risk;Moderate aspiration risk    Diet Recommendation NPO         Other  Recommendations Oral Care Recommendations: Oral care BID;Staff/trained caregiver to provide oral care    Recommendations for follow up therapy are one component of a multi-disciplinary discharge planning process, led by the attending physician.  Recommendations may be updated based on patient status, additional functional criteria and insurance authorization.  Follow up Recommendations Other (comment) (TBD)      Assistance Recommended at Discharge    Functional Status Assessment Patient has had a recent decline in their functional status and demonstrates the ability to make significant improvements in function in a reasonable and predictable amount of time.  Frequency and Duration min 1 x/week  1 week       Prognosis Prognosis for improved oropharyngeal function: Fair Barriers to Reach Goals: Cognitive deficits;Behavior      Swallow Study   General Date of Onset: 11/08/23 HPI: Patient is an 89 y.o. female with PMH: vascular dementia (lives at memory care ALF), stage IV breast cancer in remission past 3 years, essential HTN. She presented to [facility] ED on 11/07/23  after a fall. Imaging did not reveal any trauma. She was sent back to her ALF but then over the next 24 hours, she became more lethargic than usual. She returned to ED, at [facility] on 11/08/23. Vital signs stable, blood  work not suggesting of infection, afebrile, CT head negative for acute findings, CXR negative for acute findings, UA pending. She has been NPO and has been refusing interventions. Type of Study: Bedside Swallow Evaluation Previous Swallow Assessment: none  found Diet Prior to this Study: NPO Temperature Spikes Noted: No Respiratory Status: Room air History of Recent Intubation: No Behavior/Cognition: Alert;Confused;Agitated;Doesn't follow directions;Uncooperative Oral Cavity Assessment: Other (comment) (unable to assess) Oral Care Completed by SLP: No Oral Cavity - Dentition: Edentulous Patient Positioning: Upright in bed Baseline Vocal Quality: Normal;Low vocal intensity Volitional Cough: Cognitively unable to elicit Volitional Swallow: Unable to elicit    Oral/Motor/Sensory Function Overall Oral Motor/Sensory Function: Other (comment) (unable to assess but no asymmetry or focal weakness observed)   Ice Chips     Thin Liquid Thin Liquid: Impaired Other Comments: patient accepted only very small amount of spoon sip of juice, no swallow initiated    Nectar Thick     Honey Thick     Puree Puree: Not tested   Solid     Solid: Not tested     Jacqualine Mater, MA, CCC-SLP Speech Therapy

## 2023-11-08 NOTE — ED Provider Notes (Signed)
 [place] [facility] Provider Note   CSN: [id number] Arrival date & time: 11/08/23  1047     History  Chief Complaint  Patient presents with   Altered Mental Status    Jennifer Garrett is a 89 y.o. female.  89 year old female with prior medical history as detailed below presents for evaluation.  Patient was seen at [facility] yesterday after apparent fall.  Patient was sent in for reevaluation because she apparently is less verbal and interactive than normal.  Patient has history of significant dementia at baseline.  Additional history is unable to be obtained for the patient.  The history is provided by the patient and the EMS personnel.       Home Medications Prior to Admission medications   Medication Sig Start Date End Date Taking? Authorizing Provider  aspirin  81 MG chewable tablet Chew 81 mg by mouth daily.    [provider]  donepezil  (ARICEPT ) 10 MG tablet Take 10 mg by mouth daily. 03/14/16   [provider]  olmesartan (BENICAR) 40 MG tablet Take 40 mg by mouth daily.    [provider]  ondansetron  (ZOFRAN ) 4 MG tablet Take 4 mg by mouth every 6 (six) hours as needed for nausea or vomiting. Patient not taking: Reported on 05/13/2023    [provider]  potassium chloride  SA (KLOR-CON ) 20 MEQ tablet Take 20 mEq by mouth daily.    [provider]      Allergies    Lyrica [pregabalin], Feldene [piroxicam], Orudis [ketoprofen], Vibramycin [doxycycline calcium ], and Zestril [lisinopril]    Review of Systems   Review of Systems  Unable to perform ROS: Acuity of condition    Physical Exam Updated Vital Signs Temp 98.1 F (36.7 C) (Oral)  Physical Exam Vitals and nursing note reviewed.  Constitutional:      General: She is not in acute distress.    Appearance: She is well-developed.     Comments: Elderly, alert, does not answer questions.  Frail in appearance.  HENT:     Head:  Normocephalic and atraumatic.  Eyes:     Conjunctiva/sclera: Conjunctivae normal.     Pupils: Pupils are equal, round, and reactive to light.  Cardiovascular:     Rate and Rhythm: Normal rate and regular rhythm.     Heart sounds: Normal heart sounds.  Pulmonary:     Effort: Pulmonary effort is normal. No respiratory distress.     Breath sounds: Normal breath sounds.  Abdominal:     General: There is no distension.     Palpations: Abdomen is soft.     Tenderness: There is no abdominal tenderness.  Musculoskeletal:        General: No deformity. Normal range of motion.     Cervical back: Normal range of motion and neck supple.  Skin:    General: Skin is warm and dry.  Neurological:     General: No focal deficit present.     Mental Status: She is alert. Mental status is at baseline.     ED Results / Procedures / Treatments   Labs (all labs ordered are listed, but only abnormal results are displayed) Labs Reviewed  RESP PANEL BY RT-PCR (RSV, FLU A&B, COVID)  [id number]  CBC WITH DIFFERENTIAL/PLATELET  COMPREHENSIVE METABOLIC PANEL WITH GFR  URINALYSIS, W/ REFLEX TO CULTURE (INFECTION SUSPECTED)  TROPONIN I (HIGH SENSITIVITY)    EKG EKG Interpretation Date/Time:  Tuesday November 08 2023 11:00:33 EDT Ventricular Rate:  71 PR Interval:  162 QRS Duration:  114 QT Interval:  402 QTC Calculation: 437 R Axis:   -36  Text Interpretation: Sinus rhythm LVH with IVCD, LAD and secondary repol abnrm Inferior infarct, acute (RCA) Anterior ST elevation, probably due to LVH Confirmed by Angela Kell [phone]) on 11/08/2023 11:08:37 AM  Radiology CT CERVICAL SPINE WO CONTRAST Result Date: 11/07/2023 CLINICAL DATA:  Status post fall. EXAM: CT CERVICAL SPINE WITHOUT CONTRAST TECHNIQUE: Multidetector CT imaging of the cervical spine was performed without intravenous contrast. Multiplanar CT image reconstructions were also generated. RADIATION DOSE REDUCTION: This exam was performed according to the  departmental dose-optimization program which includes automated exposure control, adjustment of the mA and/or kV according to patient size and/or use of iterative reconstruction technique. COMPARISON:  February 18, 2023 FINDINGS: Alignment: Normal. Skull base and vertebrae: No acute fracture. No primary bone lesion or focal pathologic process. Soft tissues and spinal canal: No prevertebral fluid or swelling. No visible canal hematoma. Disc levels: Mild multilevel endplate sclerosis is seen throughout the cervical spine. Mild anterior osteophyte formation and posterior bony spurring are also noted at the level of C5-C6. Mild to moderate severity multilevel intervertebral disc space narrowing is seen. This is most prominent at the level of C5-C6. Moderate to marked severity bilateral multilevel facet joint hypertrophy is noted. Upper chest: There is evidence of paraseptal and centrilobular emphysematous lung disease. Other: Large bilateral thyroid nodules are seen. IMPRESSION: 1. No acute cervical spine fracture or subluxation. 2. Multilevel degenerative changes, most prominent at the level of C5-C6. Electronically Signed   By: Virgle Grime M.D.   On: 11/07/2023 20:24   CT HEAD WO CONTRAST Result Date: 11/07/2023 CLINICAL DATA:  Status post fall. EXAM: CT HEAD WITHOUT CONTRAST TECHNIQUE: Contiguous axial images were obtained from the base of the skull through the vertex without intravenous contrast. RADIATION DOSE REDUCTION: This exam was performed according to the departmental dose-optimization program which includes automated exposure control, adjustment of the mA and/or kV according to patient size and/or use of iterative reconstruction technique. COMPARISON:  May 13, 2023 FINDINGS: Brain: There is generalized cerebral atrophy with widening of the extra-axial spaces and ventricular dilatation. There are areas of decreased attenuation within the white matter tracts of the supratentorial brain, consistent  with microvascular disease changes. Vascular: No hyperdense vessel or unexpected calcification. Skull: Normal. Negative for fracture or focal lesion. Sinuses/Orbits: No acute finding. Other: None. IMPRESSION: 1. No acute intracranial abnormality. 2. Generalized cerebral atrophy with chronic white matter small vessel ischemic changes. Electronically Signed   By: Virgle Grime M.D.   On: 11/07/2023 20:19   DG Pelvis Portable Result Date: 11/07/2023 CLINICAL DATA:  fall EXAM: PORTABLE PELVIS 1-2 VIEWS COMPARISON:  October 14, 2022 FINDINGS: Osteopenia. No acute, displaced fracture or dislocation. Multilevel degenerative disc disease of the spine. Soft tissues are unremarkable. IMPRESSION: While no acute, displaced fracture or dislocation was visualized, underlying osteopenia decreases the sensitivity for nondisplaced fracture using plain radiography. If the patient is unable to bear weight, or there is high clinical suspicion, a follow-up CT should be considered for further evaluation. Electronically Signed   By: Rance Burrows M.D.   On: 11/07/2023 15:21   DG Chest Port 1 View Result Date: 11/07/2023 CLINICAL DATA:  Fall. EXAM: PORTABLE CHEST 1 VIEW COMPARISON:  10/14/2022. FINDINGS: Limited evaluation due to compromised positioning and rotation. Patient's chin obscures evaluation of the upper mediastinum and right upper lung. Cardiac silhouette is grossly within normal limits allowing for rotation and  shallow inspiration. Aortic atherosclerosis. No focal consolidation, large pleural effusion, or pneumothorax identified. Surgical clips in the right axilla. Remote left-sided rib fractures. No acute osseous abnormality identified. IMPRESSION: Significantly limited evaluation. No acute abnormality identified within these limitations. Electronically Signed   By: Mannie Seek M.D.   On: 11/07/2023 11:49    Procedures Procedures    Medications Ordered in ED Medications - No data to display  ED Course/  Medical Decision Making/ A&P                                 Medical Decision Making Amount and/or Complexity of Data Reviewed Labs: ordered. Radiology: ordered.  Risk Prescription drug management. Decision regarding hospitalization.    Medical Screen Complete  This patient presented to the ED with complaint of AMS.  This complaint involves an extensive number of treatment options. The initial differential diagnosis includes, but is not limited to, UTI, infection, CVA, metabolic abnormality, etc  This presentation is: Acute, Chronic, Self-Limited, Previously Undiagnosed, Uncertain Prognosis, Complicated, Systemic Symptoms, and Threat to Life/Bodily Function  Patient with AMS x 24-48 hours. Seen yesterday for same at [facility] ED.   Daughter reports baseline dementia, but normally talkative/argumentative. Now somnolent, but awakens with painful stimulation.   Initial workup without clear cause for patient's AMS.  Patient would benefit from admission for further workup and treatment. Hospitalist service aware of case.   Co morbidities that complicated the patient's evaluation  See HPI   Additional history obtained:  Additional history obtained from [facility] External records from outside sources obtained and reviewed including prior ED visits and prior Inpatient records.   Problem List / ED Course:  AMS   Disposition:  After consideration of the diagnostic results and the patients response to treatment, I feel that the patent would benefit from admission.          Final Clinical Impression(s) / ED Diagnoses Final diagnoses:  Altered mental status, unspecified altered mental status type    Rx / DC Orders ED Discharge Orders     None         Burnette Carte, MD 11/08/23 1818

## 2023-11-09 ENCOUNTER — Observation Stay (HOSPITAL_COMMUNITY)

## 2023-11-09 DIAGNOSIS — F01511 Vascular dementia, unspecified severity, with agitation: Secondary | ICD-10-CM | POA: Diagnosis present

## 2023-11-09 DIAGNOSIS — D696 Thrombocytopenia, unspecified: Secondary | ICD-10-CM | POA: Diagnosis present

## 2023-11-09 DIAGNOSIS — E785 Hyperlipidemia, unspecified: Secondary | ICD-10-CM | POA: Diagnosis present

## 2023-11-09 DIAGNOSIS — N390 Urinary tract infection, site not specified: Secondary | ICD-10-CM | POA: Diagnosis present

## 2023-11-09 DIAGNOSIS — R4182 Altered mental status, unspecified: Secondary | ICD-10-CM

## 2023-11-09 DIAGNOSIS — G9341 Metabolic encephalopathy: Secondary | ICD-10-CM | POA: Diagnosis present

## 2023-11-09 DIAGNOSIS — I129 Hypertensive chronic kidney disease with stage 1 through stage 4 chronic kidney disease, or unspecified chronic kidney disease: Secondary | ICD-10-CM | POA: Diagnosis present

## 2023-11-09 DIAGNOSIS — Y92122 Bedroom in nursing home as the place of occurrence of the external cause: Secondary | ICD-10-CM | POA: Diagnosis not present

## 2023-11-09 DIAGNOSIS — E041 Nontoxic single thyroid nodule: Secondary | ICD-10-CM | POA: Diagnosis present

## 2023-11-09 DIAGNOSIS — F01518 Vascular dementia, unspecified severity, with other behavioral disturbance: Secondary | ICD-10-CM | POA: Diagnosis not present

## 2023-11-09 DIAGNOSIS — Z9011 Acquired absence of right breast and nipple: Secondary | ICD-10-CM | POA: Diagnosis not present

## 2023-11-09 DIAGNOSIS — Z87891 Personal history of nicotine dependence: Secondary | ICD-10-CM | POA: Diagnosis not present

## 2023-11-09 DIAGNOSIS — S0990XA Unspecified injury of head, initial encounter: Secondary | ICD-10-CM | POA: Diagnosis present

## 2023-11-09 DIAGNOSIS — N289 Disorder of kidney and ureter, unspecified: Secondary | ICD-10-CM | POA: Diagnosis not present

## 2023-11-09 DIAGNOSIS — Z955 Presence of coronary angioplasty implant and graft: Secondary | ICD-10-CM | POA: Diagnosis not present

## 2023-11-09 DIAGNOSIS — Z85118 Personal history of other malignant neoplasm of bronchus and lung: Secondary | ICD-10-CM | POA: Diagnosis not present

## 2023-11-09 DIAGNOSIS — N1831 Chronic kidney disease, stage 3a: Secondary | ICD-10-CM | POA: Diagnosis present

## 2023-11-09 DIAGNOSIS — Z853 Personal history of malignant neoplasm of breast: Secondary | ICD-10-CM | POA: Diagnosis not present

## 2023-11-09 DIAGNOSIS — J45909 Unspecified asthma, uncomplicated: Secondary | ICD-10-CM | POA: Diagnosis present

## 2023-11-09 DIAGNOSIS — R9431 Abnormal electrocardiogram [ECG] [EKG]: Secondary | ICD-10-CM | POA: Diagnosis present

## 2023-11-09 DIAGNOSIS — Z7982 Long term (current) use of aspirin: Secondary | ICD-10-CM | POA: Diagnosis not present

## 2023-11-09 DIAGNOSIS — D649 Anemia, unspecified: Secondary | ICD-10-CM | POA: Diagnosis present

## 2023-11-09 DIAGNOSIS — Z1152 Encounter for screening for COVID-19: Secondary | ICD-10-CM | POA: Diagnosis not present

## 2023-11-09 DIAGNOSIS — E1122 Type 2 diabetes mellitus with diabetic chronic kidney disease: Secondary | ICD-10-CM | POA: Diagnosis present

## 2023-11-09 DIAGNOSIS — W1839XA Other fall on same level, initial encounter: Secondary | ICD-10-CM | POA: Diagnosis present

## 2023-11-09 DIAGNOSIS — I2510 Atherosclerotic heart disease of native coronary artery without angina pectoris: Secondary | ICD-10-CM | POA: Diagnosis present

## 2023-11-09 DIAGNOSIS — R569 Unspecified convulsions: Secondary | ICD-10-CM

## 2023-11-09 DIAGNOSIS — R7989 Other specified abnormal findings of blood chemistry: Secondary | ICD-10-CM | POA: Diagnosis present

## 2023-11-09 DIAGNOSIS — R634 Abnormal weight loss: Secondary | ICD-10-CM | POA: Diagnosis present

## 2023-11-09 LAB — COMPREHENSIVE METABOLIC PANEL WITH GFR
ALT: 15 U/L (ref 0–44)
AST: 44 U/L — ABNORMAL HIGH (ref 15–41)
Albumin: 3.3 g/dL — ABNORMAL LOW (ref 3.5–5.0)
Alkaline Phosphatase: 60 U/L (ref 38–126)
Anion gap: 9 (ref 5–15)
BUN: 24 mg/dL — ABNORMAL HIGH (ref 8–23)
CO2: 24 mmol/L (ref 22–32)
Calcium: 9.4 mg/dL (ref 8.9–10.3)
Chloride: 108 mmol/L (ref 98–111)
Creatinine, Ser: 1.28 mg/dL — ABNORMAL HIGH (ref 0.44–1.00)
GFR, Estimated: 40 mL/min — ABNORMAL LOW (ref >60)
Glucose, Bld: 85 mg/dL (ref 70–99)
Potassium: 4.7 mmol/L (ref 3.5–5.1)
Sodium: 141 mmol/L (ref 135–145)
Total Bilirubin: 0.6 mg/dL (ref 0.0–1.2)
Total Protein: 7.2 g/dL (ref 6.5–8.1)

## 2023-11-09 LAB — URINALYSIS, W/ REFLEX TO CULTURE (INFECTION SUSPECTED)
Bilirubin Urine: NEGATIVE
Glucose, UA: NEGATIVE mg/dL
Ketones, ur: 5 mg/dL — AB
Nitrite: NEGATIVE
Protein, ur: 100 mg/dL — AB
RBC / HPF: >50 RBC/hpf (ref 0–5)
Specific Gravity, Urine: 1.031 — ABNORMAL HIGH (ref 1.005–1.030)
pH: 5 (ref 5.0–8.0)

## 2023-11-09 LAB — RETICULOCYTES
Immature Retic Fract: 4.5 % (ref 2.3–15.9)
RBC.: 3.44 MIL/uL — ABNORMAL LOW (ref 3.87–5.11)
Retic Count, Absolute: 34.4 10*3/uL (ref 19.0–186.0)
Retic Ct Pct: 1 % (ref 0.4–3.1)

## 2023-11-09 LAB — FERRITIN: Ferritin: 388 ng/mL — ABNORMAL HIGH (ref 11–307)

## 2023-11-09 LAB — CBC
HCT: 31 % — ABNORMAL LOW (ref 36.0–46.0)
Hemoglobin: 9.7 g/dL — ABNORMAL LOW (ref 12.0–15.0)
MCH: 28.3 pg (ref 26.0–34.0)
MCHC: 31.3 g/dL (ref 30.0–36.0)
MCV: 90.4 fL (ref 80.0–100.0)
Platelets: 210 10*3/uL (ref 150–400)
RBC: 3.43 MIL/uL — ABNORMAL LOW (ref 3.87–5.11)
RDW: 12.5 % (ref 11.5–15.5)
WBC: 6.5 10*3/uL (ref 4.0–10.5)
nRBC: 0 % (ref 0.0–0.2)

## 2023-11-09 LAB — TROPONIN I (HIGH SENSITIVITY): Troponin I (High Sensitivity): 52 ng/L — ABNORMAL HIGH (ref <18)

## 2023-11-09 LAB — AMMONIA: Ammonia: 13 umol/L (ref 9–35)

## 2023-11-09 LAB — IRON AND TIBC
Iron: 38 ug/dL (ref 28–170)
Saturation Ratios: 14 % (ref 10.4–31.8)
TIBC: 281 ug/dL (ref 250–450)
UIBC: 243 ug/dL

## 2023-11-09 LAB — VITAMIN B12: Vitamin B-12: 262 pg/mL (ref 180–914)

## 2023-11-09 LAB — FOLATE: Folate: 20.9 ng/mL (ref >5.9)

## 2023-11-09 LAB — TSH: TSH: 1.637 u[IU]/mL (ref 0.350–4.500)

## 2023-11-09 MED ORDER — SODIUM CHLORIDE 0.45 % IV SOLN
INTRAVENOUS | Status: DC
Start: 2023-11-09 — End: 2023-11-10

## 2023-11-09 MED ORDER — HALOPERIDOL LACTATE 5 MG/ML IJ SOLN: 2.0000 mg | Freq: Four times a day (QID) | INTRAMUSCULAR | Status: DC | PRN

## 2023-11-09 MED ORDER — LORAZEPAM 0.5 MG PO TABS
0.5000 mg | ORAL_TABLET | Freq: Every day | ORAL | Status: DC | PRN
  Administered 2023-11-09: 0.5 mg via ORAL
  Filled 2023-11-09: qty 1

## 2023-11-09 MED ORDER — CYANOCOBALAMIN 1000 MCG/ML IJ SOLN
1000.0000 ug | Freq: Every day | INTRAMUSCULAR | Status: AC
  Administered 2023-11-09 – 2023-11-11 (×3): 1000 ug via INTRAMUSCULAR
  Filled 2023-11-09 (×3): qty 1

## 2023-11-09 MED ORDER — VITAMIN B-12 1000 MCG PO TABS
1000.0000 ug | ORAL_TABLET | Freq: Every day | ORAL | Status: DC
  Administered 2023-11-12 – 2023-11-14 (×3): 1000 ug via ORAL
  Filled 2023-11-09 (×4): qty 1

## 2023-11-09 MED ORDER — DIVALPROEX SODIUM 250 MG PO DR TAB
250.0000 mg | DELAYED_RELEASE_TABLET | Freq: Two times a day (BID) | ORAL | Status: DC
  Administered 2023-11-09 – 2023-11-14 (×10): 250 mg via ORAL
  Filled 2023-11-09 (×11): qty 1

## 2023-11-09 MED ORDER — CEFTRIAXONE SODIUM 1 G IJ SOLR
1.0000 g | INTRAMUSCULAR | Status: AC
  Administered 2023-11-09 – 2023-11-13 (×5): 1 g via INTRAVENOUS
  Filled 2023-11-09 (×5): qty 10

## 2023-11-09 NOTE — Progress Notes (Signed)
 Speech Language Pathology Treatment: Dysphagia  Patient Details Name: Jennifer Garrett MRN: [id number] DOB: [birth year] Today's Date: 11/09/2023 Time: 7829-5621 SLP Time Calculation (min) (ACUTE ONLY): 15 min  Assessment / Plan / Recommendation Clinical Impression  Patient seen by SLP for skilled treatment focused on dysphagia goals. She had eyes closed but woke up easily and was cooperative throughout the session. SLP assessed her swallow with water  and ice cream. She was unable to draw liquid through straw but able to drink when water  pipetted from straw. With ice cream, she readily accepted this every time spoon was presented to her lips. Mildly prolonged bolus transit and suspected delayed swallow initiation, but no overt s/s aspiration and oral cavity clear s/p swallows. SLP recommending PO diet of Dys 1(Puree) solids,, thin liquids with full supervision/total assist feeding at this time. SLP will continue to follow.   HPI HPI: Patient is an 89 y.o. female with PMH: vascular dementia (lives at memory care ALF), stage IV breast cancer in remission past 3 years, essential HTN. She presented to [facility] ED on 11/07/23  after a fall. Imaging did not reveal any trauma. She was sent back to her ALF but then over the next 24 hours, she became more lethargic than usual. She returned to ED, at [facility] on 11/08/23. Vital signs stable, blood  work not suggesting of infection, afebrile, CT head negative for acute findings, CXR negative for acute findings, UA pending. She has been NPO and has been refusing interventions.      SLP Plan  Continue with current plan of care      Recommendations for follow up therapy are one component of a multi-disciplinary discharge planning process, led by the attending physician.  Recommendations may be updated based on patient status, additional functional criteria and insurance authorization.    Recommendations  Diet recommendations: Dysphagia 1 (puree);Thin liquid Liquids provided  via: Cup;Straw;Teaspoon Medication Administration: Crushed with puree Supervision: Full supervision/cueing for compensatory strategies;Trained caregiver to feed patient;Staff to assist with self feeding Compensations: Slow rate;Small sips/bites;Minimize environmental distractions Postural Changes and/or Swallow Maneuvers: Seated upright 90 degrees                  Oral care BID;Staff/trained caregiver to provide oral care   Frequent or constant Supervision/Assistance Dysphagia, unspecified (R13.10)     Continue with current plan of care     Jacqualine Mater, MA, CCC-SLP Speech Therapy

## 2023-11-09 NOTE — Evaluation (Signed)
 Physical Therapy Evaluation Patient Details Name: Jennifer Garrett MRN: [id number] DOB: Feb 27, 1932 Today's Date: 11/09/2023  History of Present Illness  Pt is an 89 y/o F presenting to ED On 4/22 from memory care unit with AMS. Presented to [facility] ED after recent fall 4/21, was d/c'd and then became more lethargic than usual which warranted to return to ED. CT head and CXR negative. Admitted for acute metabolic encephalopathy. PMH includes vascular dementia. stage IV breast CA in remission, essential HTN  Clinical Impression  Pt admitted with above diagnosis and presents to PT with functional limitations due to deficits listed below (See PT problem list). Pt needs skilled PT to maximize independence and safety. Pt from memory care where she was ambulating (unsure if using walker). Able to amb short distance with mod assist today. Limited primarily by cognition. Recommend return to familiar environment of her memory care facility.           If plan is discharge home, recommend the following:     Can travel by private vehicle   No    Equipment Recommendations None recommended by PT  Recommendations for Other Services       Functional Status Assessment Patient has had a recent decline in their functional status and/or demonstrates limited ability to make significant improvements in function in a reasonable and predictable amount of time     Precautions / Restrictions Precautions Precautions: Fall Restrictions Weight Bearing Restrictions Per Provider Order: No      Mobility  Bed Mobility Overal bed mobility: Needs Assistance Bed Mobility: Sit to Supine, Supine to Sit     Supine to sit: Mod assist Sit to supine: Min assist   General bed mobility comments: Assist to initiate movement and elevate trunk into sitting    Transfers Overall transfer level: Needs assistance Equipment used: Rolling walker (2 wheels) Transfers: Sit to/from Stand, Bed to chair/wheelchair/BSC Sit to Stand:  Mod assist, +2 safety/equipment, +2 physical assistance           General transfer comment: Initially stood from bed with mod assist to bring hips up and stabilize. Assist to place hands correctly on walker.  Chair to bsc with walker with step pivot and required 2 person assist to manage moving walker and assisting pt..    Ambulation/Gait Ambulation/Gait assistance: Mod assist Gait Distance (Feet): 10 Feet Assistive device: Rolling walker (2 wheels) Gait Pattern/deviations: Step-to pattern, Decreased step length - right, Decreased step length - left, Shuffle, Trunk flexed, Narrow base of support, Knee flexed in stance - right, Knee flexed in stance - left Gait velocity: decr Gait velocity interpretation: <1.31 ft/sec, indicative of household ambulator   General Gait Details: Assist for balance and support. Assist to direct walker  Stairs            Wheelchair Mobility     Tilt Bed    Modified Rankin (Stroke Patients Only)       Balance Overall balance assessment: Needs assistance Sitting-balance support: Feet supported Sitting balance-Leahy Scale: Fair     Standing balance support: During functional activity, Reliant on assistive device for balance Standing balance-Leahy Scale: Poor                               Pertinent Vitals/Pain Pain Assessment Pain Assessment: Faces Faces Pain Scale: Hurts even more Pain Location: Lt hand Pain Descriptors / Indicators: Grimacing, Guarding Pain Intervention(s): Limited activity within patient's tolerance, Repositioned  Home Living Family/patient expects to be discharged to:: Skilled nursing facility                   Additional Comments: from Memory Care unit    Prior Function Prior Level of Function : Other (comment)             Mobility Comments: unsteady ambulator with ?RW, recent falls ADLs Comments: uses hands to self feed, assume assist for other ADLs     Extremity/Trunk  Assessment   Upper Extremity Assessment Upper Extremity Assessment: Defer to OT evaluation LUE Deficits / Details: more painful than RUE, able to lift against gravity, keeps LUE in fist and in a guarded position LUE Coordination: decreased fine motor;decreased gross motor    Lower Extremity Assessment Lower Extremity Assessment: Generalized weakness;Difficult to assess due to impaired cognition    Cervical / Trunk Assessment Cervical / Trunk Assessment: Kyphotic  Communication   Communication Communication: Impaired Factors Affecting Communication: Reduced clarity of speech;Difficulty expressing self    Cognition Arousal: Alert Behavior During Therapy: Restless   PT - Cognitive impairments: History of cognitive impairments                         Following commands: Impaired       Cueing Cueing Techniques: Verbal cues, Gestural cues     General Comments General comments (skin integrity, edema, etc.): VSS on RA    Exercises     Assessment/Plan    PT Assessment Patient needs continued PT services  PT Problem List Decreased strength;Decreased balance;Decreased mobility       PT Treatment Interventions DME instruction;Gait training;Functional mobility training;Therapeutic activities;Balance training;Patient/family education    PT Goals (Current goals can be found in the Care Plan section)  Acute Rehab PT Goals Patient Stated Goal: Unable to state PT Goal Formulation: Patient unable to participate in goal setting Time For Goal Achievement: 11/23/23 Potential to Achieve Goals: Fair    Frequency Min 2X/week     Co-evaluation PT/OT/SLP Co-Evaluation/Treatment: Yes Reason for Co-Treatment: For patient/therapist safety PT goals addressed during session: Mobility/safety with mobility         AM-PAC PT "6 Clicks" Mobility  Outcome Measure Help needed turning from your back to your side while in a flat bed without using bedrails?: A Lot Help needed  moving from lying on your back to sitting on the side of a flat bed without using bedrails?: A Lot Help needed moving to and from a bed to a chair (including a wheelchair)?: A Lot Help needed standing up from a chair using your arms (e.g., wheelchair or bedside chair)?: A Lot Help needed to walk in hospital room?: Total Help needed climbing 3-5 steps with a railing? : Total 6 Click Score: 10    End of Session Equipment Utilized During Treatment: Gait belt Activity Tolerance: Patient tolerated treatment well Patient left: in bed;with call bell/phone within reach;with bed alarm set   PT Visit Diagnosis: Unsteadiness on feet (R26.81);Other abnormalities of gait and mobility (R26.89);Muscle weakness (generalized) (M62.81)    Time: 1610-9604 PT Time Calculation (min) (ACUTE ONLY): 27 min   Charges:   PT Evaluation $PT Eval Moderate Complexity: 1 Mod   PT General Charges $$ ACUTE PT VISIT: 1 Visit         [facility] PT Acute Rehabilitation Services Office [phone]   Pura Browns [facility] 11/09/2023, 2:20 PM

## 2023-11-09 NOTE — Care Management Obs Status (Signed)
 MEDICARE OBSERVATION STATUS NOTIFICATION   Patient Details  Name: Jennifer Garrett MRN: [id number] Date of Birth: 03/07/1932   Medicare Observation Status Notification Given:  Yes  Spoke with Abagail Hoar patient daughter advised of the Observation Notice and that it will be mailed to home address.   Bernell Haynie 11/09/2023, 12:39 PM

## 2023-11-09 NOTE — Plan of Care (Signed)

## 2023-11-09 NOTE — Evaluation (Signed)
 Occupational Therapy Evaluation Patient Details Name: Jennifer Garrett MRN: [id number] DOB: September 11, 1931 Today's Date: 11/09/2023   History of Present Illness   Pt is an 89 y/o F presenting to ED On 4/22 from memory care unit with AMS. Presented to [facility] ED after recent fall 4/21, was d/c'd and then became more lethargic than usual which warranted to return to ED. CT head and CXR negative. Admitted for acute metabolic encephalopathy. PMH includes vascular dementia. stage IV breast CA in remission, essential HTN     Clinical Impressions Pt questionable historian, per discussion with SLP (who talked to pt family), pt ambulates but has had multiple falls, and is able to feed self with hands (does not use utensils), assume assist with other ADLs. Pt currently needing mod-max A for ADLs, min A for bed mobility.. Pt needing mod cues/physical assist to navigate RW in environment. Pt able to transfer BSC > bed with min-mod A and RW. Pt with LUE pain and guarding throughout session, but able to use for BADL/mobility with cues. Pt presenting with impairments listed below, will follow acutely. Patient will benefit from continued inpatient follow up therapy, <3 hours/day to maximize safety/ind with ADL/functional mobility.      If plan is discharge home, recommend the following:   Two people to help with walking and/or transfers;A lot of help with bathing/dressing/bathroom;Assistance with cooking/housework;Assistance with feeding;Direct supervision/assist for medications management;Direct supervision/assist for financial management;Assist for transportation;Supervision due to cognitive status;Help with stairs or ramp for entrance     Functional Status Assessment   Patient has had a recent decline in their functional status and demonstrates the ability to make significant improvements in function in a reasonable and predictable amount of time.     Equipment Recommendations   Other (comment) (defer)      Recommendations for Other Services   PT consult     Precautions/Restrictions   Precautions Precautions: Fall Restrictions Weight Bearing Restrictions Per Provider Order: No     Mobility Bed Mobility Overal bed mobility: Needs Assistance Bed Mobility: Sit to Supine       Sit to supine: Min assist        Transfers Overall transfer level: Needs assistance Equipment used: Rolling walker (2 wheels) Transfers: Sit to/from Stand Sit to Stand: Min assist, Mod assist, +2 physical assistance                  Balance Overall balance assessment: Needs assistance Sitting-balance support: Feet supported Sitting balance-Leahy Scale: Fair     Standing balance support: During functional activity, Reliant on assistive device for balance Standing balance-Leahy Scale: Poor                             ADL either performed or assessed with clinical judgement   ADL Overall ADL's : Needs assistance/impaired Eating/Feeding: Moderate assistance;Bed level   Grooming: Moderate assistance;Sitting   Upper Body Bathing: Maximal assistance;Sitting;Bed level   Lower Body Bathing: Maximal assistance;Bed level;Sitting/lateral leans   Upper Body Dressing : Maximal assistance;Sitting;Bed level   Lower Body Dressing: Maximal assistance;Sitting/lateral leans;Bed level   Toilet Transfer: Minimal assistance;Moderate assistance;+2 for physical assistance;Ambulation;Rolling walker (2 wheels)           Functional mobility during ADLs: Minimal assistance;Moderate assistance;Rolling walker (2 wheels);+2 for physical assistance       Vision   Additional Comments: will further assess, does make eye contact with therapist and tracks, but needs hand over hand assist to  place hands on RW handles and navigate RW in room     Perception Perception: Not tested       Praxis Praxis: Not tested       Pertinent Vitals/Pain Pain Assessment Pain Assessment: Faces Pain  Score: 5  Faces Pain Scale: Hurts even more Pain Location: L UE with ROM Pain Descriptors / Indicators: Discomfort Pain Intervention(s): Limited activity within patient's tolerance, Monitored during session, Repositioned     Extremity/Trunk Assessment Upper Extremity Assessment Upper Extremity Assessment: Generalized weakness;LUE deficits/detail LUE Deficits / Details: more painful than RUE, able to lift against gravity, keeps LUE in fist and in a guarded position LUE Coordination: decreased fine motor;decreased gross motor   Lower Extremity Assessment Lower Extremity Assessment: Defer to PT evaluation   Cervical / Trunk Assessment Cervical / Trunk Assessment: Kyphotic   Communication Communication Communication: Impaired Factors Affecting Communication: Reduced clarity of speech;Difficulty expressing self   Cognition Arousal: Alert Behavior During Therapy: Restless Cognition: History of cognitive impairments             OT - Cognition Comments: hx vascular dementia, able to state full name and reports "14" for DOB, then responds "14" to other questions asked                 Following commands: Impaired       Cueing  General Comments   Cueing Techniques: Verbal cues;Gestural cues  VSS on RA   Exercises     Shoulder Instructions      Home Living Family/patient expects to be discharged to:: Skilled nursing facility                                 Additional Comments: from Memory Care unit      Prior Functioning/Environment               Mobility Comments: unsteady ambulator with ?RW, recent falls ADLs Comments: uses hands to self feed, assume assist for other ADLs    OT Problem List: Decreased strength;Decreased range of motion;Decreased activity tolerance;Impaired balance (sitting and/or standing);Decreased coordination;Decreased cognition;Decreased safety awareness;Impaired vision/perception;Impaired UE functional use   OT  Treatment/Interventions: Self-care/ADL training;Therapeutic exercise;Energy conservation;DME and/or AE instruction;Therapeutic activities;Patient/family education;Balance training;Cognitive remediation/compensation;Visual/perceptual remediation/compensation      OT Goals(Current goals can be found in the care plan section)   Acute Rehab OT Goals Patient Stated Goal: pt unable to state OT Goal Formulation: With patient Time For Goal Achievement: 11/23/23 Potential to Achieve Goals: Good ADL Goals Pt Will Perform Eating: with supervision;with adaptive utensils;sitting;bed level Pt Will Perform Grooming: with supervision;standing;sitting;bed level Pt Will Transfer to Toilet: bedside commode;stand pivot transfer;squat pivot transfer;with min assist Additional ADL Goal #1: pt will perform bed mobility wtih supervision in prep for ADLs   OT Frequency:  Min 2X/week    Co-evaluation              AM-PAC OT "6 Clicks" Daily Activity     Outcome Measure Help from another person eating meals?: A Lot Help from another person taking care of personal grooming?: A Lot Help from another person toileting, which includes using toliet, bedpan, or urinal?: A Lot Help from another person bathing (including washing, rinsing, drying)?: A Lot Help from another person to put on and taking off regular upper body clothing?: A Lot Help from another person to put on and taking off regular lower body clothing?: A Lot 6 Click Score: 12  End of Session Equipment Utilized During Treatment: Gait belt;Rolling walker (2 wheels) Nurse Communication: Mobility status  Activity Tolerance: Patient tolerated treatment well Patient left: in bed;with call bell/phone within reach;with bed alarm set  OT Visit Diagnosis: Unsteadiness on feet (R26.81);Other abnormalities of gait and mobility (R26.89);Muscle weakness (generalized) (M62.81)                Time: 1308-6578 OT Time Calculation (min): 17 min Charges:  OT  General Charges $OT Visit: 1 Visit OT Evaluation $OT Eval Low Complexity: 1 Low  Marx Doig K, OTD, OTR/L SecureChat Preferred Acute Rehab [phone]   Benedict Brain Koonce 11/09/2023, 11:38 AM

## 2023-11-09 NOTE — Progress Notes (Signed)
 Refused VS

## 2023-11-09 NOTE — Progress Notes (Signed)
 Unable to perform echo due to patient being combative.

## 2023-11-09 NOTE — Progress Notes (Signed)
 Patient is back on the floor after MRI at 0905am.

## 2023-11-09 NOTE — Progress Notes (Signed)
 [facility] PROGRESS NOTE   Jennifer Garrett ZOX:[id number] DOB: [birth year] DOA: 11/08/2023  PCP: Charle Congo, MD  Brief History: 89 y.o. female with a past medical history of vascular dementia, stage IV breast cancer in remission for the past 3 years, essential hypertension who lives in a memory care unit.  She was sent to [facility] emergency department on 4/21 after she sustained a fall at her memory care unit.  She underwent imaging studies which did not reveal any trauma.  She was thought to be back to her baseline and was sent back to her facility.  Over the next 24 hours she was noted to be more lethargic than usual.  This is quite unlike her according to the daughter who is at the bedside.  She was brought back to the emergency department for further workup.  She was subsequently hospitalized.   Consultants: None  Procedures: Echocardiogram and EEG pending    Subjective/Interval History: Patient awake this morning but does not respond to all of my questions.  She denies any pain.  Overnight events noted.    Assessment/Plan:  Acute metabolic encephalopathy MRI brain without any acute process. UA still pending.  Daughter now agreeable to In-N-Out catheterization for urine sample. No other source of infection identified. TSH B12 folic acid levels pending.  Ammonia level pending.  EEG will also be ordered.  History of breast cancer in remission/weight loss. Patient's daughter reported weight loss over the last several months despite good appetite.  CT of the chest abdomen pelvis does not show any acute findings.  Thyroid nodule Incidentally detected on CT scan.  TSH is pending.  Considering her vascular dementia she is likely not a candidate for any further testing.  Normocytic anemia/thrombocytopenia Repeat labs pending from today.  Chronic kidney disease stage IIIa Renal function has been close to baseline.  Labs are pending from this morning.   Follow-up on UA  History of vascular dementia Continue Aricept  and Depakote .  Swallow evaluation.  Dysphagia diet for now.  Abnormal EKG mildly elevated troponin No chest pain.  EKG was nonspecific.  Echocardiogram has been ordered and is pending.   DVT Prophylaxis: Lovenox  Code Status: Full code Family Communication: Discussed with daughter Disposition Plan: Back to memory care unit when improved  Status is: Observation The patient will require care spanning > 2 midnights and should be moved to inpatient because: Persistent alteration in mental status      Medications: Scheduled:  divalproex   125 mg Oral BID   donepezil   10 mg Oral Daily   enoxaparin  (LOVENOX ) injection  40 mg Subcutaneous Q24H   LORazepam   0.5 mg Intravenous Once   Continuous:  sodium chloride  75 mL/hr at 11/09/23 1028   PRN:acetaminophen  **OR** acetaminophen , haloperidol  lactate, ondansetron  **OR** ondansetron  (ZOFRAN ) IV  Antibiotics: Anti-infectives (From admission, onward)    None       Objective:  Vital Signs  Vitals:   11/08/23 1659 11/08/23 1853 11/09/23 0103 11/09/23 0926  BP:  136/69 (!) 142/70 (!) 167/81  Pulse:  64 (!) 102 76  Resp:  18 18 18   Temp: 98.7 F (37.1 C)     TempSrc: Oral     SpO2:  100% 100%    No intake or output data in the 24 hours ending 11/09/23 1058 There were no vitals filed for this visit.  General appearance: Awake alert.  In no distress.  Distracted Resp: Clear to auscultation bilaterally.  Normal effort Cardio: S1-S2 is normal regular.  No S3-S4.  No rubs murmurs or bruit GI: Abdomen is soft.  Nontender nondistended.  Bowel sounds are present normal.  No masses organomegaly Extremities: No edema.     Lab Results:  Data Reviewed: I have personally reviewed following labs and reports of the imaging studies  CBC: Recent Labs  Lab 11/08/23 1106  WBC 5.3  NEUTROABS 3.8  HGB 9.5*  HCT 30.2*  MCV 92.4  PLT 134*    Basic Metabolic  Panel: Recent Labs  Lab 11/08/23 1106  NA 140  K 4.6  CL 112*  CO2 17*  GLUCOSE 102*  BUN 26*  CREATININE 1.14*  CALCIUM  8.2*    GFR: CrCl cannot be calculated (Unknown ideal weight.).  Liver Function Tests: Recent Labs  Lab 11/08/23 1106  AST 34  ALT 10  ALKPHOS 49  BILITOT 0.3  PROT 5.7*  ALBUMIN 2.8*    CBG: Recent Labs  Lab 11/07/23 1038  GLUCAP 72     Recent Results (from the past 240 hours)  Resp panel by RT-PCR (RSV, Flu A&B, Covid) Anterior Nasal Swab     Status: None   Collection Time: 11/08/23 11:07 AM   Specimen: Anterior Nasal Swab  Result Value Ref Range Status   SARS Coronavirus 2 by RT PCR NEGATIVE NEGATIVE Final   Influenza A by PCR NEGATIVE NEGATIVE Final   Influenza B by PCR NEGATIVE NEGATIVE Final    Comment: (NOTE) The Xpert Xpress SARS-CoV-2/FLU/RSV plus assay is intended as an aid in the diagnosis of influenza from Nasopharyngeal swab specimens and should not be used as a sole basis for treatment. Nasal washings and aspirates are unacceptable for Xpert Xpress SARS-CoV-2/FLU/RSV testing.  Fact Sheet for Patients: BloggerCourse.com  Fact Sheet for Healthcare Providers: SeriousBroker.it  This test is not yet approved or cleared by the [place]  FDA and has been authorized for detection and/or diagnosis of SARS-CoV-2 by FDA under an Emergency Use Authorization (EUA). This EUA will remain in effect (meaning this test can be used) for the duration of the COVID-19 declaration under Section 564(b)(1) of the Act, 21 U.S.C. section 360bbb-3(b)(1), unless the authorization is terminated or revoked.     Resp Syncytial Virus by PCR NEGATIVE NEGATIVE Final    Comment: (NOTE) Fact Sheet for Patients: BloggerCourse.com  Fact Sheet for Healthcare Providers: SeriousBroker.it  This test is not yet approved or cleared by the [place]   FDA and has been authorized for detection and/or diagnosis of SARS-CoV-2 by FDA under an Emergency Use Authorization (EUA). This EUA will remain in effect (meaning this test can be used) for the duration of the COVID-19 declaration under Section 564(b)(1) of the Act, 21 U.S.C. section 360bbb-3(b)(1), unless the authorization is terminated or revoked.  Performed at [facility], [address], [place], [place] [address]       Radiology Studies: MR BRAIN WO CONTRAST Result Date: 11/09/2023 CLINICAL DATA:  Mental status change, recent fall. EXAM: MRI HEAD WITHOUT CONTRAST TECHNIQUE: Multiplanar, multiecho pulse sequences of the brain and surrounding structures were obtained without intravenous contrast. COMPARISON:  CT head 11/08/2023. FINDINGS: Brain: No acute infarct. No evidence of intracranial hemorrhage. Scattered and confluent T2/FLAIR hyperintensity in the periventricular and subcortical white matter. 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 Additional remote lacunar infarcts in the bilateral thalami. Moderate parenchymal volume loss. No mass lesion or midline shift. Cerebellum is unremarkable. Normal appearance of midline structures. The basilar cisterns are patent. No extra-axial fluid collections. Ventricles: Prominence of the lateral  ventricles suggestive of underlying parenchymal volume loss. Vascular: Skull base flow voids are visualized. Skull and upper cervical spine: Degenerative changes in the visualized upper cervical spine. The visualized calvarium is unremarkable. Sinuses/Orbits: Orbits are symmetric. Paranasal sinuses are clear. Other: Trace fluid in the right mastoid air cells. IMPRESSION: No acute intracranial abnormality. Moderate parenchymal volume loss and moderate chronic microvascular ischemic changes. 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 Additional remote lacunar infarcts in the bilateral thalami. Electronically Signed   By: Denny Flack M.D.    On: 11/09/2023 09:19   CT CHEST ABDOMEN PELVIS W CONTRAST Result Date: 11/08/2023 CLINICAL DATA:  Weight loss.  History of breast cancer. EXAM: CT CHEST, ABDOMEN, AND PELVIS WITH CONTRAST TECHNIQUE: Multidetector CT imaging of the chest, abdomen and pelvis was performed following the standard protocol during bolus administration of intravenous contrast. RADIATION DOSE REDUCTION: This exam was performed according to the departmental dose-optimization program which includes automated exposure control, adjustment of the mA and/or kV according to patient size and/or use of iterative reconstruction technique. CONTRAST:  75mL OMNIPAQUE  IOHEXOL  350 MG/ML SOLN COMPARISON:  September 28, 2019. FINDINGS: CT CHEST FINDINGS Cardiovascular: Mild cardiomegaly. No evidence of thoracic aortic dissection or aneurysm. No pericardial effusion. Coronary artery calcifications are noted. Mediastinum/Nodes: 3 cm complex left thyroid nodule is noted. No adenopathy. Esophagus is unremarkable. Lungs/Pleura: No pneumothorax or pleural effusion is noted. Minimal bibasilar subsegmental atelectasis or scarring is noted. Musculoskeletal: No chest wall mass or suspicious bone lesions identified. CT ABDOMEN PELVIS FINDINGS Hepatobiliary: No focal liver abnormality is seen. No gallstones, gallbladder wall thickening, or biliary dilatation. Pancreas: Unremarkable. No pancreatic ductal dilatation or surrounding inflammatory changes. Spleen: Normal in size without focal abnormality. Adrenals/Urinary Tract: Adrenal glands appear normal. Stable left renal cysts are noted for which no further follow-up is required. No hydronephrosis or renal obstruction. Urinary bladder is unremarkable. Stomach/Bowel: The stomach is unremarkable. There is no evidence of bowel obstruction or inflammation. The appendix is not visualized. Vascular/Lymphatic: Aortic atherosclerosis. No enlarged abdominal or pelvic lymph nodes. Reproductive: Status post hysterectomy. No adnexal  masses. Other: No ascites or hernia. Musculoskeletal: No acute or significant osseous findings. IMPRESSION: 3 cm complex left thyroid nodule. Recommend thyroid US . (Ref: J Am Coll Radiol. 2015 Feb;12(2): 143-50). Coronary artery calcifications are noted. No other acute abnormality seen in the chest, abdomen or pelvis. Aortic Atherosclerosis (ICD10-I70.0). Electronically Signed   By: Rosalene Colon M.D.   On: 11/08/2023 16:05   CT Head Wo Contrast Result Date: 11/08/2023 CLINICAL DATA:  Altered mental status. EXAM: CT HEAD WITHOUT CONTRAST TECHNIQUE: Contiguous axial images were obtained from the base of the skull through the vertex without intravenous contrast. RADIATION DOSE REDUCTION: This exam was performed according to the departmental dose-optimization program which includes automated exposure control, adjustment of the mA and/or kV according to patient size and/or use of iterative reconstruction technique. COMPARISON:  Head CT dated 11/07/2023. FINDINGS: Brain: Moderate age-related atrophy and chronic microvascular ischemic changes. There is no acute intracranial hemorrhage. No mass effect or midline shift. No extra-axial fluid collection. Vascular: No hyperdense vessel or unexpected calcification. Skull: Normal. Negative for fracture or focal lesion. Sinuses/Orbits: No acute finding. Other: None IMPRESSION: 1. No acute intracranial pathology. 2. Moderate age-related atrophy and chronic microvascular ischemic changes. Electronically Signed   By: Angus Bark M.D.   On: 11/08/2023 13:38   DG Chest Port 1 View Result Date: 11/08/2023 CLINICAL DATA:  Shortness of breath. EXAM: PORTABLE CHEST 1 VIEW COMPARISON:  November 07, 2023. FINDINGS: The heart size and mediastinal contours are within normal limits. Both lungs are clear. The visualized skeletal structures are unremarkable. IMPRESSION: No active disease. Electronically Signed   By: Rosalene Colon M.D.   On: 11/08/2023 12:57   CT CERVICAL SPINE WO  CONTRAST Result Date: 11/07/2023 CLINICAL DATA:  Status post fall. EXAM: CT CERVICAL SPINE WITHOUT CONTRAST TECHNIQUE: Multidetector CT imaging of the cervical spine was performed without intravenous contrast. Multiplanar CT image reconstructions were also generated. RADIATION DOSE REDUCTION: This exam was performed according to the departmental dose-optimization program which includes automated exposure control, adjustment of the mA and/or kV according to patient size and/or use of iterative reconstruction technique. COMPARISON:  February 18, 2023 FINDINGS: Alignment: Normal. Skull base and vertebrae: No acute fracture. No primary bone lesion or focal pathologic process. Soft tissues and spinal canal: No prevertebral fluid or swelling. No visible canal hematoma. Disc levels: Mild multilevel endplate sclerosis is seen throughout the cervical spine. Mild anterior osteophyte formation and posterior bony spurring are also noted at the level of C5-C6. Mild to moderate severity multilevel intervertebral disc space narrowing is seen. This is most prominent at the level of C5-C6. Moderate to marked severity bilateral multilevel facet joint hypertrophy is noted. Upper chest: There is evidence of paraseptal and centrilobular emphysematous lung disease. Other: Large bilateral thyroid nodules are seen. IMPRESSION: 1. No acute cervical spine fracture or subluxation. 2. Multilevel degenerative changes, most prominent at the level of C5-C6. Electronically Signed   By: Virgle Grime M.D.   On: 11/07/2023 20:24   CT HEAD WO CONTRAST Result Date: 11/07/2023 CLINICAL DATA:  Status post fall. EXAM: CT HEAD WITHOUT CONTRAST TECHNIQUE: Contiguous axial images were obtained from the base of the skull through the vertex without intravenous contrast. RADIATION DOSE REDUCTION: This exam was performed according to the departmental dose-optimization program which includes automated exposure control, adjustment of the mA and/or kV  according to patient size and/or use of iterative reconstruction technique. COMPARISON:  May 13, 2023 FINDINGS: Brain: There is generalized cerebral atrophy with widening of the extra-axial spaces and ventricular dilatation. There are areas of decreased attenuation within the white matter tracts of the supratentorial brain, consistent with microvascular disease changes. Vascular: No hyperdense vessel or unexpected calcification. Skull: Normal. Negative for fracture or focal lesion. Sinuses/Orbits: No acute finding. Other: None. IMPRESSION: 1. No acute intracranial abnormality. 2. Generalized cerebral atrophy with chronic white matter small vessel ischemic changes. Electronically Signed   By: Virgle Grime M.D.   On: 11/07/2023 20:19   DG Pelvis Portable Result Date: 11/07/2023 CLINICAL DATA:  fall EXAM: PORTABLE PELVIS 1-2 VIEWS COMPARISON:  October 14, 2022 FINDINGS: Osteopenia. No acute, displaced fracture or dislocation. Multilevel degenerative disc disease of the spine. Soft tissues are unremarkable. IMPRESSION: While no acute, displaced fracture or dislocation was visualized, underlying osteopenia decreases the sensitivity for nondisplaced fracture using plain radiography. If the patient is unable to bear weight, or there is high clinical suspicion, a follow-up CT should be considered for further evaluation. Electronically Signed   By: Rance Burrows M.D.   On: 11/07/2023 15:21       LOS: 0 days   Jennifer Garrett Jennifer Garrett  [facility] Pager on [email or web address]  11/09/2023, 10:58 AM

## 2023-11-09 NOTE — Progress Notes (Signed)
 Patient transported to MRI at 0800.

## 2023-11-09 NOTE — Progress Notes (Signed)
 EEG complete, Results pending

## 2023-11-09 NOTE — Procedures (Signed)
 Patient Name: NEVEEN DAPONTE  MRN: [id number]  Epilepsy Attending: Arleene Lack  Referring Physician/Provider: Maylene Spear, MD  Date:  11/09/2023 Duration: 30.30 mins  Patient history: 89yo F with ams. EEG to evaluate for seizure  Level of alertness: Awake, asleep  AEDs during EEG study: VPA, Ativan   Technical aspects: This EEG study was done with scalp electrodes positioned according to the 10-20 International system of electrode placement. Electrical activity was reviewed with band pass filter of 1-70Hz , sensitivity of 7 uV/mm, display speed of 68mm/sec with a 60Hz  notched filter applied as appropriate. EEG data were recorded continuously and digitally stored.  Video monitoring was available and reviewed as appropriate.  Description: No clear posterior dominant rhythm was seen. Sleep was characterized by vertex waves, sleep spindles (12 to 14 Hz), maximal frontocentral region. EEG showed continuous generalized 3 to 6 Hz theta-delta slowing. Hyperventilation and photic stimulation were not performed.     ABNORMALITY - Continuous slow, generalized  IMPRESSION: This study is suggestive of moderate diffuse encephalopathy. No seizures or epileptiform discharges were seen throughout the recording.  Zaccheaus Storlie O Kennis Buell

## 2023-11-10 ENCOUNTER — Inpatient Hospital Stay (HOSPITAL_COMMUNITY)

## 2023-11-10 DIAGNOSIS — G9341 Metabolic encephalopathy: Secondary | ICD-10-CM | POA: Diagnosis not present

## 2023-11-10 DIAGNOSIS — R9431 Abnormal electrocardiogram [ECG] [EKG]: Secondary | ICD-10-CM | POA: Diagnosis not present

## 2023-11-10 DIAGNOSIS — R7989 Other specified abnormal findings of blood chemistry: Secondary | ICD-10-CM | POA: Diagnosis not present

## 2023-11-10 DIAGNOSIS — N390 Urinary tract infection, site not specified: Secondary | ICD-10-CM | POA: Diagnosis not present

## 2023-11-10 DIAGNOSIS — F01518 Vascular dementia, unspecified severity, with other behavioral disturbance: Secondary | ICD-10-CM | POA: Diagnosis not present

## 2023-11-10 LAB — ECHOCARDIOGRAM COMPLETE
AR max vel: 1.08 cm2
AV Area VTI: 1.17 cm2
AV Area mean vel: 1.12 cm2
AV Mean grad: 19 mmHg
AV Peak grad: 37 mmHg
Ao pk vel: 3.04 m/s
MV VTI: 1.45 cm2

## 2023-11-10 MED ORDER — SODIUM CHLORIDE 0.45 % IV SOLN
INTRAVENOUS | Status: DC
Start: 2023-11-10 — End: 2023-11-11

## 2023-11-10 NOTE — Plan of Care (Signed)

## 2023-11-10 NOTE — Progress Notes (Signed)
 [facility] PROGRESS NOTE   AKOSUA CONSTANTINE ZOX:[id number] DOB: 1931/09/19 DOA: 11/08/2023  PCP: Charle Congo, MD  Brief History: 89 y.o. female with a past medical history of vascular dementia, stage IV breast cancer in remission for the past 3 years, essential hypertension who lives in a memory care unit.  She was sent to [facility] emergency department on 4/21 after she sustained a fall at her memory care unit.  She underwent imaging studies which did not reveal any trauma.  She was thought to be back to her baseline and was sent back to her facility.  Over the next 24 hours she was noted to be more lethargic than usual.  This is quite unlike her according to the daughter who is at the bedside.  She was brought back to the emergency department for further workup.  She was subsequently hospitalized.   Consultants: None  Procedures:  Echocardiogram still pending EEG    Subjective/Interval History: Patient noted to be in mittens this morning.  Episodes of agitation noted all day yesterday.  Does not answer questions this morning.  Pressures anyway when I tried to examine.    Assessment/Plan:  Acute metabolic encephalopathy MRI brain without any acute process. UA was noted to be abnormal.  Patient started on antibiotics. EEG unremarkable. B12 level borderline low.  Started on supplementation. Folic acid levels normal.  Ammonia level normal.  TSH noted to be normal. Patient has had significant workup here in the hospital without any significant findings except for possible UTI as a reason for her encephalopathy.  Will see if there is any improvement with antibiotics.  Acute UTI UA noted to be abnormal.  Patient started on ceftriaxone .  History of breast cancer in remission/weight loss. Patient's daughter reported weight loss over the last several months despite good appetite.  CT of the chest abdomen pelvis does not show any acute findings. Could be from poor  oral intake.  Thyroid nodule Incidentally detected on CT scan.  TSH is normal.  Considering her vascular dementia she is likely not a candidate for any further testing.  Results communicated to patient's daughter.  Normocytic anemia/thrombocytopenia Hemoglobin is stable.  Chronic kidney disease stage IIIa Renal function has been close to baseline.  Monitor urine output.  History of vascular dementia Continue Aricept  and Depakote .  Swallow evaluation.  Dysphagia diet for now.  Abnormal EKG mildly elevated troponin No chest pain.  EKG was nonspecific.  Echocardiogram remains pending at this time.  Patient did not allow it yesterday.     DVT Prophylaxis: Lovenox  Code Status: Full code Family Communication: Discussed with daughter Disposition Plan: Back to memory care unit when improved      Medications: Scheduled:  cyanocobalamin   1,000 mcg Intramuscular Daily   Followed by   Cecily Cohen ON 11/12/2023] vitamin B-12  1,000 mcg Oral Daily   divalproex   250 mg Oral BID   donepezil   10 mg Oral Daily   enoxaparin  (LOVENOX ) injection  40 mg Subcutaneous Q24H   LORazepam   0.5 mg Intravenous Once   Continuous:  sodium chloride  75 mL/hr at 11/10/23 0709   cefTRIAXone  (ROCEPHIN )  IV Stopped (11/09/23 1713)   PRN:acetaminophen  **OR** acetaminophen , haloperidol  lactate, LORazepam , ondansetron  **OR** ondansetron  (ZOFRAN ) IV  Antibiotics: Anti-infectives (From admission, onward)    Start     Dose/Rate Route Frequency Ordered Stop   11/09/23 1600  cefTRIAXone  (ROCEPHIN ) 1 g in sodium chloride  0.9 % 100 mL IVPB        1  g 200 mL/hr over 30 Minutes Intravenous Every 24 hours 11/09/23 1516         Objective:  Vital Signs  Vitals:   11/09/23 1550 11/09/23 1558 11/10/23 0520 11/10/23 0805  BP: (!) 245/219 (!) 146/60 (!) 147/87 (!) 141/130  Pulse: 87 76 60 76  Resp: 17 17 18 17   Temp: 98.7 F (37.1 C) 98.7 F (37.1 C) 100.2 F (37.9 C) (!) 97.5 F (36.4 C)  TempSrc: Oral Oral  Oral Oral  SpO2: 100% 100% 100% 93%    Intake/Output Summary (Last 24 hours) at 11/10/2023 1002 Last data filed at 11/10/2023 0315 Gross per 24 hour  Intake 1266.89 ml  Output 100 ml  Net 1166.89 ml    General appearance: Noted to be awake but distracted.  In no distress Resp: Clear to auscultation bilaterally.  Normal effort Cardio: S1-S2 is normal regular.  No S3-S4.  No rubs murmurs or bruit GI: Abdomen is soft.  Nontender nondistended.  Bowel sounds are present normal.  No masses organomegaly Extremities: No edema.  Moving all of her extremities. No obvious focal neurological deficits.  Lab Results:  Data Reviewed: I have personally reviewed following labs and reports of the imaging studies  CBC: Recent Labs  Lab 11/08/23 1106 11/09/23 1100  WBC 5.3 6.5  NEUTROABS 3.8  --   HGB 9.5* 9.7*  HCT 30.2* 31.0*  MCV 92.4 90.4  PLT 134* 210    Basic Metabolic Panel: Recent Labs  Lab 11/08/23 1106 11/09/23 1100  NA 140 141  K 4.6 4.7  CL 112* 108  CO2 17* 24  GLUCOSE 102* 85  BUN 26* 24*  CREATININE 1.14* 1.28*  CALCIUM  8.2* 9.4    GFR: CrCl cannot be calculated (Unknown ideal weight.).  Liver Function Tests: Recent Labs  Lab 11/08/23 1106 11/09/23 1100  AST 34 44*  ALT 10 15  ALKPHOS 49 60  BILITOT 0.3 0.6  PROT 5.7* 7.2  ALBUMIN 2.8* 3.3*    CBG: Recent Labs  Lab 11/07/23 1038  GLUCAP 72     Recent Results (from the past 240 hours)  Resp panel by RT-PCR (RSV, Flu A&B, Covid) Anterior Nasal Swab     Status: None   Collection Time: 11/08/23 11:07 AM   Specimen: Anterior Nasal Swab  Result Value Ref Range Status   SARS Coronavirus 2 by RT PCR NEGATIVE NEGATIVE Final   Influenza A by PCR NEGATIVE NEGATIVE Final   Influenza B by PCR NEGATIVE NEGATIVE Final    Comment: (NOTE) The Xpert Xpress SARS-CoV-2/FLU/RSV plus assay is intended as an aid in the diagnosis of influenza from Nasopharyngeal swab specimens and should not be used as a sole  basis for treatment. Nasal washings and aspirates are unacceptable for Xpert Xpress SARS-CoV-2/FLU/RSV testing.  Fact Sheet for Patients: BloggerCourse.com  Fact Sheet for Healthcare Providers: SeriousBroker.it  This test is not yet approved or cleared by the [place]  FDA and has been authorized for detection and/or diagnosis of SARS-CoV-2 by FDA under an Emergency Use Authorization (EUA). This EUA will remain in effect (meaning this test can be used) for the duration of the COVID-19 declaration under Section 564(b)(1) of the Act, 21 U.S.C. section 360bbb-3(b)(1), unless the authorization is terminated or revoked.     Resp Syncytial Virus by PCR NEGATIVE NEGATIVE Final    Comment: (NOTE) Fact Sheet for Patients: BloggerCourse.com  Fact Sheet for Healthcare Providers: SeriousBroker.it  This test is not yet approved or cleared by the [place]  FDA  and has been authorized for detection and/or diagnosis of SARS-CoV-2 by FDA under an Emergency Use Authorization (EUA). This EUA will remain in effect (meaning this test can be used) for the duration of the COVID-19 declaration under Section 564(b)(1) of the Act, 21 U.S.C. section 360bbb-3(b)(1), unless the authorization is terminated or revoked.  Performed at [facility], [address], [place], [place] [address]       Radiology Studies: EEG adult Result Date: 11/09/2023 Arleene Lack, MD     11/09/2023  2:27 PM Patient Name: SARABI SOCKWELL MRN: [id number] Epilepsy Attending: Arleene Lack Referring Physician/Provider: Maylene Spear, MD Date:  11/09/2023 Duration: 30.30 mins Patient history: 89yo F with ams. EEG to evaluate for seizure Level of alertness: Awake, asleep AEDs during EEG study: VPA, Ativan  Technical aspects: This EEG study was done with scalp electrodes positioned according to the 10-20 International  system of electrode placement. Electrical activity was reviewed with band pass filter of 1-70Hz , sensitivity of 7 uV/mm, display speed of 30mm/sec with a 60Hz  notched filter applied as appropriate. EEG data were recorded continuously and digitally stored.  Video monitoring was available and reviewed as appropriate. Description: No clear posterior dominant rhythm was seen. Sleep was characterized by vertex waves, sleep spindles (12 to 14 Hz), maximal frontocentral region. EEG showed continuous generalized 3 to 6 Hz theta-delta slowing. Hyperventilation and photic stimulation were not performed.   ABNORMALITY - Continuous slow, generalized IMPRESSION: This study is suggestive of moderate diffuse encephalopathy. No seizures or epileptiform discharges were seen throughout the recording. Arleene Lack   MR BRAIN WO CONTRAST Result Date: 11/09/2023 CLINICAL DATA:  Mental status change, recent fall. EXAM: MRI HEAD WITHOUT CONTRAST TECHNIQUE: Multiplanar, multiecho pulse sequences of the brain and surrounding structures were obtained without intravenous contrast. COMPARISON:  CT head 11/08/2023. FINDINGS: Brain: No acute infarct. No evidence of intracranial hemorrhage. Scattered and confluent T2/FLAIR hyperintensity in the periventricular and subcortical white matter. 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 Additional remote lacunar infarcts in the bilateral thalami. Moderate parenchymal volume loss. No mass lesion or midline shift. Cerebellum is unremarkable. Normal appearance of midline structures. The basilar cisterns are patent. No extra-axial fluid collections. Ventricles: Prominence of the lateral ventricles suggestive of underlying parenchymal volume loss. Vascular: Skull base flow voids are visualized. Skull and upper cervical spine: Degenerative changes in the visualized upper cervical spine. The visualized calvarium is unremarkable. Sinuses/Orbits: Orbits are symmetric. Paranasal sinuses are clear.  Other: Trace fluid in the right mastoid air cells. IMPRESSION: No acute intracranial abnormality. Moderate parenchymal volume loss and moderate chronic microvascular ischemic changes. 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 Additional remote lacunar infarcts in the bilateral thalami. Electronically Signed   By: Denny Flack M.D.   On: 11/09/2023 09:19   CT CHEST ABDOMEN PELVIS W CONTRAST Result Date: 11/08/2023 CLINICAL DATA:  Weight loss.  History of breast cancer. EXAM: CT CHEST, ABDOMEN, AND PELVIS WITH CONTRAST TECHNIQUE: Multidetector CT imaging of the chest, abdomen and pelvis was performed following the standard protocol during bolus administration of intravenous contrast. RADIATION DOSE REDUCTION: This exam was performed according to the departmental dose-optimization program which includes automated exposure control, adjustment of the mA and/or kV according to patient size and/or use of iterative reconstruction technique. CONTRAST:  75mL OMNIPAQUE  IOHEXOL  350 MG/ML SOLN COMPARISON:  September 28, 2019. FINDINGS: CT CHEST FINDINGS Cardiovascular: Mild cardiomegaly. No evidence of thoracic aortic dissection or aneurysm. No pericardial effusion. Coronary artery calcifications are noted.  Mediastinum/Nodes: 3 cm complex left thyroid nodule is noted. No adenopathy. Esophagus is unremarkable. Lungs/Pleura: No pneumothorax or pleural effusion is noted. Minimal bibasilar subsegmental atelectasis or scarring is noted. Musculoskeletal: No chest wall mass or suspicious bone lesions identified. CT ABDOMEN PELVIS FINDINGS Hepatobiliary: No focal liver abnormality is seen. No gallstones, gallbladder wall thickening, or biliary dilatation. Pancreas: Unremarkable. No pancreatic ductal dilatation or surrounding inflammatory changes. Spleen: Normal in size without focal abnormality. Adrenals/Urinary Tract: Adrenal glands appear normal. Stable left renal cysts are noted for which no further follow-up is required. No  hydronephrosis or renal obstruction. Urinary bladder is unremarkable. Stomach/Bowel: The stomach is unremarkable. There is no evidence of bowel obstruction or inflammation. The appendix is not visualized. Vascular/Lymphatic: Aortic atherosclerosis. No enlarged abdominal or pelvic lymph nodes. Reproductive: Status post hysterectomy. No adnexal masses. Other: No ascites or hernia. Musculoskeletal: No acute or significant osseous findings. IMPRESSION: 3 cm complex left thyroid nodule. Recommend thyroid US . (Ref: J Am Coll Radiol. 2015 Feb;12(2): 143-50). Coronary artery calcifications are noted. No other acute abnormality seen in the chest, abdomen or pelvis. Aortic Atherosclerosis (ICD10-I70.0). Electronically Signed   By: Rosalene Colon M.D.   On: 11/08/2023 16:05   CT Head Wo Contrast Result Date: 11/08/2023 CLINICAL DATA:  Altered mental status. EXAM: CT HEAD WITHOUT CONTRAST TECHNIQUE: Contiguous axial images were obtained from the base of the skull through the vertex without intravenous contrast. RADIATION DOSE REDUCTION: This exam was performed according to the departmental dose-optimization program which includes automated exposure control, adjustment of the mA and/or kV according to patient size and/or use of iterative reconstruction technique. COMPARISON:  Head CT dated 11/07/2023. FINDINGS: Brain: Moderate age-related atrophy and chronic microvascular ischemic changes. There is no acute intracranial hemorrhage. No mass effect or midline shift. No extra-axial fluid collection. Vascular: No hyperdense vessel or unexpected calcification. Skull: Normal. Negative for fracture or focal lesion. Sinuses/Orbits: No acute finding. Other: None IMPRESSION: 1. No acute intracranial pathology. 2. Moderate age-related atrophy and chronic microvascular ischemic changes. Electronically Signed   By: Angus Bark M.D.   On: 11/08/2023 13:38   DG Chest Port 1 View Result Date: 11/08/2023 CLINICAL DATA:  Shortness of  breath. EXAM: PORTABLE CHEST 1 VIEW COMPARISON:  November 07, 2023. FINDINGS: The heart size and mediastinal contours are within normal limits. Both lungs are clear. The visualized skeletal structures are unremarkable. IMPRESSION: No active disease. Electronically Signed   By: Rosalene Colon M.D.   On: 11/08/2023 12:57       LOS: 1 day   Venora Kautzman  [facility] Pager on [email or web address]  11/10/2023, 10:02 AM

## 2023-11-10 NOTE — Progress Notes (Signed)
 Speech Language Pathology Treatment: Dysphagia  Patient Details Name: Jennifer Garrett MRN: [id number] DOB: [birth year] Today's Date: 11/10/2023 Time: 4098-1191 SLP Time Calculation (min) (ACUTE ONLY): 15 min  Assessment / Plan / Recommendation Clinical Impression  Patient seen by SLP for skilled treatment focused on dysphagia goals. She was awake, alert, very pleasant. She interacted well with SLP and although she continues to exhibit incoherent speech, she was able to answer basic level questions such as how she was feeling, etc, indicate wants/needs when given choices and acknowledged SLP student observer, making appropriate eye contact. When asked what flavor of ice cream she wanted, she requested "strawberry". SLP fed patient ice cream which she consumed without overt s/s aspiration and without observed oral delays or suspected swallow initiation delays. SLP recommending continue with current PO diet. As patient appears to be improving towards baseline (as compared to daughter's report), SLP will continue to follow to determine if she can safely advance with solids.    HPI HPI: Patient is an 89 y.o. female with PMH: vascular dementia (lives at memory care ALF), stage IV breast cancer in remission past 3 years, essential HTN. She presented to [facility] ED on 11/07/23  after a fall. Imaging did not reveal any trauma. She was sent back to her ALF but then over the next 24 hours, she became more lethargic than usual. She returned to ED, at [facility] on 11/08/23. Vital signs stable, blood  work not suggesting of infection, afebrile, CT head negative for acute findings, CXR negative for acute findings, UA pending. She has been NPO and has been refusing interventions.      SLP Plan         Recommendations for follow up therapy are one component of a multi-disciplinary discharge planning process, led by the attending physician.  Recommendations may be updated based on patient status, additional functional criteria and  insurance authorization.    Recommendations  Diet recommendations: Dysphagia 1 (puree);Thin liquid Liquids provided via: Cup;Straw;Teaspoon Medication Administration: Crushed with puree Supervision: Full supervision/cueing for compensatory strategies;Trained caregiver to feed patient;Staff to assist with self feeding Compensations: Slow rate;Small sips/bites;Minimize environmental distractions Postural Changes and/or Swallow Maneuvers: Seated upright 90 degrees                                    Jennifer Mater, MA, CCC-SLP Speech Therapy

## 2023-11-10 NOTE — Progress Notes (Signed)
  Echocardiogram 2D Echocardiogram has been performed.  Jennifer Garrett 11/10/2023, 4:36 PM

## 2023-11-11 DIAGNOSIS — G9341 Metabolic encephalopathy: Secondary | ICD-10-CM | POA: Diagnosis not present

## 2023-11-11 DIAGNOSIS — F01518 Vascular dementia, unspecified severity, with other behavioral disturbance: Secondary | ICD-10-CM | POA: Diagnosis not present

## 2023-11-11 DIAGNOSIS — N390 Urinary tract infection, site not specified: Secondary | ICD-10-CM | POA: Diagnosis not present

## 2023-11-11 LAB — BASIC METABOLIC PANEL WITH GFR
Anion gap: 10 (ref 5–15)
BUN: 32 mg/dL — ABNORMAL HIGH (ref 8–23)
CO2: 20 mmol/L — ABNORMAL LOW (ref 22–32)
Calcium: 8.2 mg/dL — ABNORMAL LOW (ref 8.9–10.3)
Chloride: 105 mmol/L (ref 98–111)
Creatinine, Ser: 1.45 mg/dL — ABNORMAL HIGH (ref 0.44–1.00)
GFR, Estimated: 34 mL/min — ABNORMAL LOW (ref >60)
Glucose, Bld: 85 mg/dL (ref 70–99)
Potassium: 4.5 mmol/L (ref 3.5–5.1)
Sodium: 135 mmol/L (ref 135–145)

## 2023-11-11 MED ORDER — SODIUM CHLORIDE 0.45 % IV SOLN: INTRAVENOUS | Status: DC

## 2023-11-11 NOTE — Progress Notes (Signed)
 [facility] PROGRESS NOTE   Jennifer Garrett WGN:[id number] DOB: 1932/06/27 DOA: 11/08/2023  PCP: Charle Congo, MD  Brief History: 89 y.o. female with a past medical history of vascular dementia, stage IV breast cancer in remission for the past 3 years, essential hypertension who lives in a memory care unit.  She was sent to [facility] emergency department on 4/21 after she sustained a fall at her memory care unit.  She underwent imaging studies which did not reveal any trauma.  She was thought to be back to her baseline and was sent back to her facility.  Over the next 24 hours she was noted to be more lethargic than usual.  This is quite unlike her according to the daughter who is at the bedside.  She was brought back to the emergency department for further workup.  She was subsequently hospitalized.   Consultants: None  Procedures:  Echocardiogram EEG    Subjective/Interval History: Patient was asleep this morning.  Upon waking up she was able to answer a few questions appropriately.  Mentation seems to be slightly better today compared to yesterday.  Still in mittens.      Assessment/Plan:  Acute metabolic encephalopathy MRI brain without any acute process. UA was noted to be abnormal.  Patient started on antibiotics. EEG unremarkable. B12 level borderline low.  Started on supplementation. Folic acid levels normal.  Ammonia level normal.  TSH noted to be normal. Patient has had extensive workup here in the hospital without any significant findings except for possible UTI as a reason for her encephalopathy.  Mentation seems to have improved this morning but we will see how she does over the next 24 to 48 hours.  Leave her on antibiotics. Dose of Depakote  was increased yesterday which may have helped as well.   Acute UTI UA noted to be abnormal.  Patient started on ceftriaxone .  Some improvement noted in mentation as discussed above.  History of breast cancer  in remission/weight loss. Patient's daughter reported weight loss over the last several months despite good appetite.  CT of the chest abdomen pelvis does not show any acute findings. Could be from poor oral intake. Outpatient monitoring  Thyroid nodule Incidentally detected on CT scan.  TSH is normal.  Considering her vascular dementia she is likely not a candidate for any further testing.  Results communicated to patient's daughter.  Normocytic anemia/thrombocytopenia Hemoglobin is stable.  Acute on chronic kidney disease stage IIIa Mild worsening noted in creatinine this morning.  Still very close to her baseline.  Could be from poor oral intake.  Continue gentle hydration.  Recheck labs in the morning.  History of vascular dementia Continue Aricept  and Depakote .  Swallow evaluation.  Dysphagia diet for now. Dose of Depakote  was increased yesterday.  Mentation seems to be slightly better today.  Abnormal EKG mildly elevated troponin No chest pain.  EKG was nonspecific.  Echocardiogram shows normal LVEF without any regional wall motion abnormalities.  Concern for moderate aortic valve stenosis noted.  No further workup at this time.   DVT Prophylaxis: Lovenox  Code Status: Full code Family Communication: Discussed with daughter Disposition Plan: Back to memory care unit when improved.  Anticipate discharge in 1 to 2 days.      Medications: Scheduled:  cyanocobalamin   1,000 mcg Intramuscular Daily   Followed by   Cecily Cohen ON 11/12/2023] vitamin B-12  1,000 mcg Oral Daily   divalproex   250 mg Oral BID   donepezil   10 mg  Oral Daily   enoxaparin  (LOVENOX ) injection  40 mg Subcutaneous Q24H   LORazepam   0.5 mg Intravenous Once   Continuous:  sodium chloride      cefTRIAXone  (ROCEPHIN )  IV Stopped (11/10/23 1514)   PRN:acetaminophen  **OR** acetaminophen , haloperidol  lactate, LORazepam , ondansetron  **OR** ondansetron  (ZOFRAN ) IV  Antibiotics: Anti-infectives (From admission,  onward)    Start     Dose/Rate Route Frequency Ordered Stop   11/09/23 1600  cefTRIAXone  (ROCEPHIN ) 1 g in sodium chloride  0.9 % 100 mL IVPB        1 g 200 mL/hr over 30 Minutes Intravenous Every 24 hours 11/09/23 1516         Objective:  Vital Signs  Vitals:   11/10/23 2055 11/11/23 0100 11/11/23 0609 11/11/23 0746  BP: (!) 121/35 (!) 96/45 (!) 108/49 (!) 126/51  Pulse: 70 68 62 (!) 58  Resp: 18 18 18    Temp:    99.9 F (37.7 C)  TempSrc:    Oral  SpO2: 92% (!) 88% 96% 100%    Intake/Output Summary (Last 24 hours) at 11/11/2023 0840 Last data filed at 11/11/2023 0302 Gross per 24 hour  Intake 1104.56 ml  Output --  Net 1104.56 ml    General appearance: More cooperative this morning.  Less distracted. Resp: Clear to auscultation bilaterally.  Normal effort Cardio: S1-S2 is normal regular.  No S3-S4.  No rubs murmurs or bruit GI: Abdomen is soft.  Nontender nondistended.  Bowel sounds are present normal.  No masses organomegaly   Lab Results:  Data Reviewed: I have personally reviewed following labs and reports of the imaging studies  CBC: Recent Labs  Lab 11/08/23 1106 11/09/23 1100  WBC 5.3 6.5  NEUTROABS 3.8  --   HGB 9.5* 9.7*  HCT 30.2* 31.0*  MCV 92.4 90.4  PLT 134* 210    Basic Metabolic Panel: Recent Labs  Lab 11/08/23 1106 11/09/23 1100 11/11/23 0714  NA 140 141 135  K 4.6 4.7 4.5  CL 112* 108 105  CO2 17* 24 20*  GLUCOSE 102* 85 85  BUN 26* 24* 32*  CREATININE 1.14* 1.28* 1.45*  CALCIUM  8.2* 9.4 8.2*    GFR: CrCl cannot be calculated (Unknown ideal weight.).  Liver Function Tests: Recent Labs  Lab 11/08/23 1106 11/09/23 1100  AST 34 44*  ALT 10 15  ALKPHOS 49 60  BILITOT 0.3 0.6  PROT 5.7* 7.2  ALBUMIN 2.8* 3.3*    CBG: Recent Labs  Lab 11/07/23 1038  GLUCAP 72     Recent Results (from the past 240 hours)  Resp panel by RT-PCR (RSV, Flu A&B, Covid) Anterior Nasal Swab     Status: None   Collection Time: 11/08/23  11:07 AM   Specimen: Anterior Nasal Swab  Result Value Ref Range Status   SARS Coronavirus 2 by RT PCR NEGATIVE NEGATIVE Final   Influenza A by PCR NEGATIVE NEGATIVE Final   Influenza B by PCR NEGATIVE NEGATIVE Final    Comment: (NOTE) The Xpert Xpress SARS-CoV-2/FLU/RSV plus assay is intended as an aid in the diagnosis of influenza from Nasopharyngeal swab specimens and should not be used as a sole basis for treatment. Nasal washings and aspirates are unacceptable for Xpert Xpress SARS-CoV-2/FLU/RSV testing.  Fact Sheet for Patients: BloggerCourse.com  Fact Sheet for Healthcare Providers: SeriousBroker.it  This test is not yet approved or cleared by the [place]  FDA and has been authorized for detection and/or diagnosis of SARS-CoV-2 by FDA under an Emergency Use Authorization (EUA).  This EUA will remain in effect (meaning this test can be used) for the duration of the COVID-19 declaration under Section 564(b)(1) of the Act, 21 U.S.C. section 360bbb-3(b)(1), unless the authorization is terminated or revoked.     Resp Syncytial Virus by PCR NEGATIVE NEGATIVE Final    Comment: (NOTE) Fact Sheet for Patients: BloggerCourse.com  Fact Sheet for Healthcare Providers: SeriousBroker.it  This test is not yet approved or cleared by the [place]  FDA and has been authorized for detection and/or diagnosis of SARS-CoV-2 by FDA under an Emergency Use Authorization (EUA). This EUA will remain in effect (meaning this test can be used) for the duration of the COVID-19 declaration under Section 564(b)(1) of the Act, 21 U.S.C. section 360bbb-3(b)(1), unless the authorization is terminated or revoked.  Performed at [facility], [address], [place], [place] [address]       Radiology Studies: ECHOCARDIOGRAM COMPLETE Result Date: 11/10/2023    ECHOCARDIOGRAM REPORT    Patient Name:   Jennifer Garrett Date of Exam: 11/10/2023 Medical Rec #:  [id number]     Height:       65.0 in Accession #:    [id number]    Weight:       130.1 lb Date of Birth:  1932/02/08     BSA:          1.648 m Patient Age:    89 years      BP:           141/130 mmHg Patient Gender: F             HR:           69 bpm. Exam Location:  Inpatient Procedure: 2D Echo (Both Spectral and Color Flow Doppler were utilized during            procedure). Indications:    elevated troponin  History:        Patient has prior history of Echocardiogram examinations, most                 recent 03/23/2022. CAD, chronic kidney disease; Risk                 Factors:Dyslipidemia and Hypertension.  Sonographer:    Dione Franks RDCS Referring Phys: [id number] Roxana Copier  Sonographer Comments: Image acquisition challenging due to uncooperative patient. IMPRESSIONS  1. Left ventricular ejection fraction, by estimation, is 65 to 70%. The left ventricle has normal function. The left ventricle has no regional wall motion abnormalities. There is moderate concentric left ventricular hypertrophy. Left ventricular diastolic parameters are consistent with Grade I diastolic dysfunction (impaired relaxation).  2. Right ventricular systolic function is normal. The right ventricular size is normal. There is mildly elevated pulmonary artery systolic pressure. The estimated right ventricular systolic pressure is 41.0 mmHg.  3. Left atrial size was moderately dilated.  4. The mitral valve is degenerative. Mild mitral valve regurgitation. No evidence of mitral stenosis. Moderate mitral annular calcification.  5. The aortic valve is tricuspid. There is moderate calcification of the aortic valve. Aortic valve regurgitation is trivial. Moderate aortic valve stenosis. Aortic valve area, by VTI measures 1.17 cm. Aortic valve mean gradient measures 19.0 mmHg. Aortic valve Vmax measures 3.04 m/s.  6. The inferior vena cava is normal in size with greater  than 50% respiratory variability, suggesting right atrial pressure of 3 mmHg. FINDINGS  Left Ventricle: Left ventricular ejection fraction, by estimation, is 65 to 70%. The left ventricle has  normal function. The left ventricle has no regional wall motion abnormalities. The left ventricular internal cavity size was normal in size. There is  moderate concentric left ventricular hypertrophy. Left ventricular diastolic parameters are consistent with Grade I diastolic dysfunction (impaired relaxation). Right Ventricle: The right ventricular size is normal. No increase in right ventricular wall thickness. Right ventricular systolic function is normal. There is mildly elevated pulmonary artery systolic pressure. The tricuspid regurgitant velocity is 3.00  m/s, and with an assumed right atrial pressure of 5 mmHg, the estimated right ventricular systolic pressure is 41.0 mmHg. Left Atrium: Left atrial size was moderately dilated. Right Atrium: Right atrial size was normal in size. Pericardium: There is no evidence of pericardial effusion. Mitral Valve: The mitral valve is degenerative in appearance. Moderate mitral annular calcification. Mild mitral valve regurgitation. No evidence of mitral valve stenosis. MV peak gradient, 17.6 mmHg. The mean mitral valve gradient is 5.0 mmHg. Tricuspid Valve: The tricuspid valve is normal in structure. Tricuspid valve regurgitation is not demonstrated. No evidence of tricuspid stenosis. Aortic Valve: The aortic valve is tricuspid. There is moderate calcification of the aortic valve. Aortic valve regurgitation is trivial. Moderate aortic stenosis is present. Aortic valve mean gradient measures 19.0 mmHg. Aortic valve peak gradient measures 37.0 mmHg. Aortic valve area, by VTI measures 1.17 cm. Pulmonic Valve: The pulmonic valve was normal in structure. Pulmonic valve regurgitation is not visualized. No evidence of pulmonic stenosis. Aorta: The aortic root is normal in size and structure.  Venous: The inferior vena cava is normal in size with greater than 50% respiratory variability, suggesting right atrial pressure of 3 mmHg. IAS/Shunts: No atrial level shunt detected by color flow Doppler.  LEFT VENTRICLE PLAX 2D LVOT diam:     1.90 cm   Diastology LV SV:         70        LV e' medial:  7.94 cm/s LV SV Index:   43        LV e' lateral: 10.20 cm/s LVOT Area:     2.84 cm  RIGHT VENTRICLE             IVC RV Basal diam:  2.30 cm     IVC diam: 1.40 cm RV S prime:     25.50 cm/s TAPSE (M-mode): 2.0 cm LEFT ATRIUM             Index        RIGHT ATRIUM           Index LA Vol (A2C):   81.4 ml 49.41 ml/m  RA Area:     24.00 cm LA Vol (A4C):   56.8 ml 34.47 ml/m  RA Volume:   77.20 ml  46.86 ml/m LA Biplane Vol: 69.8 ml 42.37 ml/m  AORTIC VALVE AV Area (Vmax):    1.08 cm AV Area (Vmean):   1.12 cm AV Area (VTI):     1.17 cm AV Vmax:           304.00 cm/s AV Vmean:          200.000 cm/s AV VTI:            0.603 m AV Peak Grad:      37.0 mmHg AV Mean Grad:      19.0 mmHg LVOT Vmax:         116.00 cm/s LVOT Vmean:        78.850 cm/s LVOT VTI:  0.248 m LVOT/AV VTI ratio: 0.41  AORTA Ao Root diam: 3.00 cm MITRAL VALVE              TRICUSPID VALVE MV Area VTI:  1.45 cm    TR Peak grad:   36.0 mmHg MV Peak grad: 17.6 mmHg   TR Vmax:        300.00 cm/s MV Mean grad: 5.0 mmHg MV Vmax:      2.10 m/s    SHUNTS MV Vmean:     101.0 cm/s  Systemic VTI:  0.25 m                           Systemic Diam: 1.90 cm Jules Oar MD Electronically signed by Jules Oar MD Signature Date/Time: 11/10/2023/4:52:02 PM    Final    EEG adult Result Date: 11/09/2023 Arleene Lack, MD     11/09/2023  2:27 PM Patient Name: Jennifer Garrett MRN: [id number] Epilepsy Attending: Arleene Lack Referring Physician/Provider: Maylene Spear, MD Date:  11/09/2023 Duration: 30.30 mins Patient history: 89yo F with ams. EEG to evaluate for seizure Level of alertness: Awake, asleep AEDs during EEG study: VPA, Ativan   Technical aspects: This EEG study was done with scalp electrodes positioned according to the 10-20 International system of electrode placement. Electrical activity was reviewed with band pass filter of 1-70Hz , sensitivity of 7 uV/mm, display speed of 47mm/sec with a 60Hz  notched filter applied as appropriate. EEG data were recorded continuously and digitally stored.  Video monitoring was available and reviewed as appropriate. Description: No clear posterior dominant rhythm was seen. Sleep was characterized by vertex waves, sleep spindles (12 to 14 Hz), maximal frontocentral region. EEG showed continuous generalized 3 to 6 Hz theta-delta slowing. Hyperventilation and photic stimulation were not performed.   ABNORMALITY - Continuous slow, generalized IMPRESSION: This study is suggestive of moderate diffuse encephalopathy. No seizures or epileptiform discharges were seen throughout the recording. Priyanka Suzanne Erps       LOS: 2 days   Tyshauna Finkbiner [employer] Pager on [email or web address]  11/11/2023, 8:40 AM

## 2023-11-11 NOTE — Progress Notes (Signed)
 Occupational Therapy Treatment Patient Details Name: Jennifer Garrett MRN: [id number] DOB: [birth year] Today's Date: 11/11/2023   History of present illness Pt is an 89 y/o F presenting to ED On 4/22 from memory care unit with AMS. Presented to [facility] ED after recent fall 4/21, was d/c'd and then became more lethargic than usual which warranted to return to ED. CT head and CXR negative. Admitted for acute metabolic encephalopathy. PMH includes vascular dementia. stage IV breast CA in remission, essential HTN   OT comments  Patient received in supine and agreeable to OT treatment. Patient instructed on bed mobility and mod assist to for supine to sitting on EOB. Once on EOB patient able to perform hand and face hygiene with mod assist. Standing attempted from EOB with mod assist +2 HHA. Patient became agitated after standing and attempting to return to bed. Patient assisted back to bed with mod assist. Patient will benefit from continued inpatient follow up therapy, <3 hours/day. Acute OT to continue to follow to address established goals to facilitate DC to next venue of care.       If plan is discharge home, recommend the following:  Two people to help with walking and/or transfers;A lot of help with bathing/dressing/bathroom;Assistance with cooking/housework;Assistance with feeding;Direct supervision/assist for medications management;Direct supervision/assist for financial management;Assist for transportation;Supervision due to cognitive status;Help with stairs or ramp for entrance   Equipment Recommendations  Other (comment) (defer)    Recommendations for Other Services      Precautions / Restrictions Precautions Precautions: Fall Restrictions Weight Bearing Restrictions Per Provider Order: No       Mobility Bed Mobility Overal bed mobility: Needs Assistance Bed Mobility: Sit to Supine, Supine to Sit     Supine to sit: Mod assist Sit to supine: Mod assist   General bed mobility  comments: mod assist with trunk and BLE with bed used to assist with scooting to EOB    Transfers Overall transfer level: Needs assistance Equipment used: 2 person hand held assist Transfers: Sit to/from Stand Sit to Stand: Mod assist, +2 safety/equipment, +2 physical assistance           General transfer comment: mod assist for sit to stand from EOB with 2 person HHA and assistance with balance     Balance Overall balance assessment: Needs assistance Sitting-balance support: Feet supported Sitting balance-Leahy Scale: Fair Sitting balance - Comments: CGA to supervision   Standing balance support: During functional activity, Reliant on assistive device for balance Standing balance-Leahy Scale: Poor Standing balance comment: reliant on therapist for support                           ADL either performed or assessed with clinical judgement   ADL Overall ADL's : Needs assistance/impaired     Grooming: Moderate assistance;Sitting;Wash/dry hands;Wash/dry face Grooming Details (indicate cue type and reason): required assistance to initiate and complete                               General ADL Comments: limited self care task due to agitation    Extremity/Trunk Assessment              Vision       Perception     Praxis     Communication Communication Communication: Impaired Factors Affecting Communication: Reduced clarity of speech;Difficulty expressing self   Cognition Arousal: Alert Behavior During Therapy: Restless Cognition:  History of cognitive impairments             OT - Cognition Comments: became agitated at end of session                 Following commands: Impaired        Cueing   Cueing Techniques: Verbal cues, Gestural cues  Exercises      Shoulder Instructions       General Comments      Pertinent Vitals/ Pain       Pain Assessment Pain Assessment: Faces Pain Location: Lt hand Pain Descriptors  / Indicators: Grimacing, Guarding Pain Intervention(s): Limited activity within patient's tolerance, Repositioned  Home Living                                          Prior Functioning/Environment              Frequency  Min 2X/week        Progress Toward Goals  OT Goals(current goals can now be found in the care plan section)  Progress towards OT goals: Progressing toward goals  Acute Rehab OT Goals Patient Stated Goal: none stated OT Goal Formulation: Patient unable to participate in goal setting Time For Goal Achievement: 11/23/23 Potential to Achieve Goals: Good ADL Goals Pt Will Perform Eating: with supervision;with adaptive utensils;sitting;bed level Pt Will Perform Grooming: with supervision;standing;sitting;bed level Pt Will Transfer to Toilet: bedside commode;stand pivot transfer;squat pivot transfer;with min assist Additional ADL Goal #1: pt will perform bed mobility wtih supervision in prep for ADLs  Plan      Co-evaluation                 AM-PAC OT "6 Clicks" Daily Activity     Outcome Measure   Help from another person eating meals?: A Lot Help from another person taking care of personal grooming?: A Lot Help from another person toileting, which includes using toliet, bedpan, or urinal?: A Lot Help from another person bathing (including washing, rinsing, drying)?: A Lot Help from another person to put on and taking off regular upper body clothing?: A Lot Help from another person to put on and taking off regular lower body clothing?: A Lot 6 Click Score: 12    End of Session Equipment Utilized During Treatment: Gait belt  OT Visit Diagnosis: Unsteadiness on feet (R26.81);Other abnormalities of gait and mobility (R26.89);Muscle weakness (generalized) (M62.81)   Activity Tolerance Treatment limited secondary to agitation   Patient Left in bed;with call bell/phone within reach;with bed alarm set;with restraints reapplied    Nurse Communication Mobility status        Time: 6213-0865 OT Time Calculation (min): 12 min  Charges: OT General Charges $OT Visit: 1 Visit OT Treatments $Self Care/Home Management : 8-22 mins  Anitra Barn, OTA Acute Rehabilitation Services  Office [phone]   Jovita Nipper 11/11/2023, 3:14 PM

## 2023-11-11 NOTE — Care Management Important Message (Signed)
 Important Message  Patient Details  Name: Jennifer Garrett MRN: [id number] Date of Birth: 13-Jun-1932   Important Message Given:  Yes - Medicare IM     Wynonia Hedges 11/11/2023, 3:24 PM

## 2023-11-11 NOTE — Plan of Care (Signed)

## 2023-11-11 NOTE — Plan of Care (Signed)
  Problem: Clinical Measurements: Goal: Cardiovascular complication will be avoided Outcome: Progressing   Problem: Activity: Goal: Risk for activity intolerance will decrease Outcome: Progressing   Problem: Nutrition: Goal: Adequate nutrition will be maintained Outcome: Progressing   Problem: Coping: Goal: Level of anxiety will decrease Outcome: Progressing   Problem: Elimination: Goal: Will not experience complications related to urinary retention Outcome: Progressing   Problem: Pain Managment: Goal: General experience of comfort will improve and/or be controlled Outcome: Progressing   Problem: Safety: Goal: Ability to remain free from injury will improve Outcome: Progressing   Problem: Skin Integrity: Goal: Risk for impaired skin integrity will decrease Outcome: Progressing

## 2023-11-12 DIAGNOSIS — F01518 Vascular dementia, unspecified severity, with other behavioral disturbance: Secondary | ICD-10-CM | POA: Diagnosis not present

## 2023-11-12 DIAGNOSIS — G9341 Metabolic encephalopathy: Secondary | ICD-10-CM | POA: Diagnosis not present

## 2023-11-12 DIAGNOSIS — N390 Urinary tract infection, site not specified: Secondary | ICD-10-CM | POA: Diagnosis not present

## 2023-11-12 LAB — BASIC METABOLIC PANEL WITH GFR
Anion gap: 5 (ref 5–15)
BUN: 27 mg/dL — ABNORMAL HIGH (ref 8–23)
CO2: 24 mmol/L (ref 22–32)
Calcium: 8.7 mg/dL — ABNORMAL LOW (ref 8.9–10.3)
Chloride: 110 mmol/L (ref 98–111)
Creatinine, Ser: 1.11 mg/dL — ABNORMAL HIGH (ref 0.44–1.00)
GFR, Estimated: 48 mL/min — ABNORMAL LOW (ref >60)
Glucose, Bld: 86 mg/dL (ref 70–99)
Potassium: 4.8 mmol/L (ref 3.5–5.1)
Sodium: 139 mmol/L (ref 135–145)

## 2023-11-12 LAB — CBC
HCT: 29.7 % — ABNORMAL LOW (ref 36.0–46.0)
Hemoglobin: 9.5 g/dL — ABNORMAL LOW (ref 12.0–15.0)
MCH: 28.4 pg (ref 26.0–34.0)
MCHC: 32 g/dL (ref 30.0–36.0)
MCV: 88.9 fL (ref 80.0–100.0)
Platelets: 181 10*3/uL (ref 150–400)
RBC: 3.34 MIL/uL — ABNORMAL LOW (ref 3.87–5.11)
RDW: 12.4 % (ref 11.5–15.5)
WBC: 5.6 10*3/uL (ref 4.0–10.5)
nRBC: 0 % (ref 0.0–0.2)

## 2023-11-12 MED ORDER — HYDRALAZINE HCL 20 MG/ML IJ SOLN
5.0000 mg | Freq: Four times a day (QID) | INTRAMUSCULAR | Status: DC | PRN
  Administered 2023-11-12 (×2): 5 mg via INTRAVENOUS
  Filled 2023-11-12 (×3): qty 1

## 2023-11-12 NOTE — Plan of Care (Signed)
   Problem: Education: Goal: Knowledge of General Education information will improve Description: Including pain rating scale, medication(s)/side effects and non-pharmacologic comfort measures Outcome: Progressing   Problem: Clinical Measurements: Goal: Respiratory complications will improve Outcome: Progressing   Problem: Activity: Goal: Risk for activity intolerance will decrease Outcome: Progressing

## 2023-11-12 NOTE — Plan of Care (Signed)
  Problem: Clinical Measurements: Goal: Will remain free from infection Outcome: Progressing Goal: Diagnostic test results will improve Outcome: Progressing Goal: Respiratory complications will improve Outcome: Progressing Goal: Cardiovascular complication will be avoided Outcome: Progressing   Problem: Activity: Goal: Risk for activity intolerance will decrease Outcome: Progressing   Problem: Pain Managment: Goal: General experience of comfort will improve and/or be controlled Outcome: Progressing   Problem: Skin Integrity: Goal: Risk for impaired skin integrity will decrease Outcome: Progressing   Problem: Education: Goal: Knowledge of General Education information will improve Description: Including pain rating scale, medication(s)/side effects and non-pharmacologic comfort measures Outcome: Not Progressing   Problem: Health Behavior/Discharge Planning: Goal: Ability to manage health-related needs will improve Outcome: Not Progressing   Problem: Clinical Measurements: Goal: Ability to maintain clinical measurements within normal limits will improve Outcome: Not Progressing   Problem: Nutrition: Goal: Adequate nutrition will be maintained Outcome: Not Progressing   Problem: Coping: Goal: Level of anxiety will decrease Outcome: Not Progressing   Problem: Elimination: Goal: Will not experience complications related to bowel motility Outcome: Not Progressing Goal: Will not experience complications related to urinary retention Outcome: Not Progressing   Problem: Safety: Goal: Ability to remain free from injury will improve Outcome: Not Progressing

## 2023-11-12 NOTE — Progress Notes (Signed)
 [facility] PROGRESS NOTE   Jennifer Garrett ZOX:[id number] DOB: 1932/04/05 DOA: 11/08/2023  PCP: Charle Congo, MD  Brief History: 89 y.o. female with a past medical history of vascular dementia, stage IV breast cancer in remission for the past 3 years, essential hypertension who lives in a memory care unit.  She was sent to [facility] emergency department on 4/21 after she sustained a fall at her memory care unit.  She underwent imaging studies which did not reveal any trauma.  She was thought to be back to her baseline and was sent back to her facility.  Over the next 24 hours she was noted to be more lethargic than usual.  This is quite unlike her according to the daughter who is at the bedside.  She was brought back to the emergency department for further workup.  She was subsequently hospitalized.  Consultants: None  Procedures:  Echocardiogram EEG   Subjective/Interval History: Patient noted to be somewhat agitated this morning.  Not answering questions.       Assessment/Plan:  Acute metabolic encephalopathy MRI brain without any acute process. UA was noted to be abnormal.  Patient started on antibiotics. EEG unremarkable. B12 level borderline low.  Started on supplementation. Folic acid levels normal.  Ammonia level normal.  TSH noted to be normal. Patient has had extensive workup here in the hospital without any significant findings except for possible UTI as a reason for her encephalopathy.  She is no longer lethargic but has episodes of agitation.  Dose of Depakote  was increased 2 days ago.  Continue current management for now.  Acute UTI UA noted to be abnormal.  Patient started on ceftriaxone .  No culture data available.  Some improvement in mentation noted.  Will plan for 5-day course of antibiotics.    History of breast cancer in remission/weight loss. Patient's daughter reported weight loss over the last several months despite good appetite.  CT of  the chest abdomen pelvis does not show any acute findings. Could be from poor oral intake. Outpatient monitoring  Thyroid nodule Incidentally detected on CT scan.  TSH is normal.  Considering her vascular dementia she is likely not a candidate for any further testing.  Results communicated to patient's daughter.  She can discuss this with the PCP as well.  Normocytic anemia/thrombocytopenia Hemoglobin is stable.  Acute on chronic kidney disease stage IIIa Creatinine has improved this morning.  Encourage oral intake.  Monitor urine output.  History of vascular dementia Continue Aricept  and Depakote .  Swallow evaluation.  Dysphagia diet for now. Dose of Depakote  was increased.    Abnormal EKG mildly elevated troponin No chest pain.  EKG was nonspecific.  Echocardiogram shows normal LVEF without any regional wall motion abnormalities.  Concern for moderate aortic valve stenosis noted.  No further workup at this time.   DVT Prophylaxis: Lovenox  Code Status: Full code Family Communication: Discussed with daughter Disposition Plan: Back to memory care unit when improved.  Anticipate discharge in 1 to 2 days.      Medications: Scheduled:  vitamin B-12  1,000 mcg Oral Daily   divalproex   250 mg Oral BID   donepezil   10 mg Oral Daily   enoxaparin  (LOVENOX ) injection  40 mg Subcutaneous Q24H   LORazepam   0.5 mg Intravenous Once   Continuous:  cefTRIAXone  (ROCEPHIN )  IV 1 g (11/11/23 1551)   PRN:acetaminophen  **OR** acetaminophen , haloperidol  lactate, LORazepam , ondansetron  **OR** ondansetron  (ZOFRAN ) IV  Antibiotics: Anti-infectives (From admission, onward)  Start     Dose/Rate Route Frequency Ordered Stop   11/09/23 1600  cefTRIAXone  (ROCEPHIN ) 1 g in sodium chloride  0.9 % 100 mL IVPB        1 g 200 mL/hr over 30 Minutes Intravenous Every 24 hours 11/09/23 1516         Objective:  Vital Signs  Vitals:   11/11/23 1601 11/11/23 1935 11/12/23 0451 11/12/23 0900  BP:  (!) 141/107 (!) 132/90 (!) 114/90 (!) 204/186  Pulse: (!) 59 65 (!) 57 (!) 112  Resp:  17 18 17   Temp:  98.9 F (37.2 C) (!) 97.5 F (36.4 C)   TempSrc:  Oral Oral   SpO2: 100% 94%  98%    Intake/Output Summary (Last 24 hours) at 11/12/2023 0919 Last data filed at 11/11/2023 1253 Gross per 24 hour  Intake 130 ml  Output --  Net 130 ml    Patient noted to be awake alert agitated.  Mittens are noted. Lungs seems to be clear to auscultation bilaterally.  Unable to do cardiac assessment as patient pushes me away.  Further examination could not be accomplished.   Lab Results:  Data Reviewed: I have personally reviewed following labs and reports of the imaging studies  CBC: Recent Labs  Lab 11/08/23 1106 11/09/23 1100 11/12/23 0832  WBC 5.3 6.5 5.6  NEUTROABS 3.8  --   --   HGB 9.5* 9.7* 9.5*  HCT 30.2* 31.0* 29.7*  MCV 92.4 90.4 88.9  PLT 134* 210 181    Basic Metabolic Panel: Recent Labs  Lab 11/08/23 1106 11/09/23 1100 11/11/23 0714 11/12/23 0832  NA 140 141 135 139  K 4.6 4.7 4.5 4.8  CL 112* 108 105 110  CO2 17* 24 20* 24  GLUCOSE 102* 85 85 86  BUN 26* 24* 32* 27*  CREATININE 1.14* 1.28* 1.45* 1.11*  CALCIUM  8.2* 9.4 8.2* 8.7*    GFR: CrCl cannot be calculated (Unknown ideal weight.).  Liver Function Tests: Recent Labs  Lab 11/08/23 1106 11/09/23 1100  AST 34 44*  ALT 10 15  ALKPHOS 49 60  BILITOT 0.3 0.6  PROT 5.7* 7.2  ALBUMIN 2.8* 3.3*    CBG: Recent Labs  Lab 11/07/23 1038  GLUCAP 72     Recent Results (from the past 240 hours)  Resp panel by RT-PCR (RSV, Flu A&B, Covid) Anterior Nasal Swab     Status: None   Collection Time: 11/08/23 11:07 AM   Specimen: Anterior Nasal Swab  Result Value Ref Range Status   SARS Coronavirus 2 by RT PCR NEGATIVE NEGATIVE Final   Influenza A by PCR NEGATIVE NEGATIVE Final   Influenza B by PCR NEGATIVE NEGATIVE Final    Comment: (NOTE) The Xpert Xpress SARS-CoV-2/FLU/RSV plus assay is intended  as an aid in the diagnosis of influenza from Nasopharyngeal swab specimens and should not be used as a sole basis for treatment. Nasal washings and aspirates are unacceptable for Xpert Xpress SARS-CoV-2/FLU/RSV testing.  Fact Sheet for Patients: BloggerCourse.com  Fact Sheet for Healthcare Providers: SeriousBroker.it  This test is not yet approved or cleared by the [place]  FDA and has been authorized for detection and/or diagnosis of SARS-CoV-2 by FDA under an Emergency Use Authorization (EUA). This EUA will remain in effect (meaning this test can be used) for the duration of the COVID-19 declaration under Section 564(b)(1) of the Act, 21 U.S.C. section 360bbb-3(b)(1), unless the authorization is terminated or revoked.     Resp Syncytial Virus by PCR  NEGATIVE NEGATIVE Final    Comment: (NOTE) Fact Sheet for Patients: BloggerCourse.com  Fact Sheet for Healthcare Providers: SeriousBroker.it  This test is not yet approved or cleared by the [place]  FDA and has been authorized for detection and/or diagnosis of SARS-CoV-2 by FDA under an Emergency Use Authorization (EUA). This EUA will remain in effect (meaning this test can be used) for the duration of the COVID-19 declaration under Section 564(b)(1) of the Act, 21 U.S.C. section 360bbb-3(b)(1), unless the authorization is terminated or revoked.  Performed at [facility], [address], [place], [place] [address]       Radiology Studies: ECHOCARDIOGRAM COMPLETE Result Date: 11/10/2023    ECHOCARDIOGRAM REPORT   Patient Name:   Jennifer Garrett Date of Exam: 11/10/2023 Medical Rec #:  [id number]     Height:       65.0 in Accession #:    [id number]    Weight:       130.1 lb Date of Birth:  07-14-1932     BSA:          1.648 m Patient Age:    89 years      BP:           141/130 mmHg Patient Gender: F             HR:            69 bpm. Exam Location:  Inpatient Procedure: 2D Echo (Both Spectral and Color Flow Doppler were utilized during            procedure). Indications:    elevated troponin  History:        Patient has prior history of Echocardiogram examinations, most                 recent 03/23/2022. CAD, chronic kidney disease; Risk                 Factors:Dyslipidemia and Hypertension.  Sonographer:    Dione Franks RDCS Referring Phys: [id number] Roxana Copier  Sonographer Comments: Image acquisition challenging due to uncooperative patient. IMPRESSIONS  1. Left ventricular ejection fraction, by estimation, is 65 to 70%. The left ventricle has normal function. The left ventricle has no regional wall motion abnormalities. There is moderate concentric left ventricular hypertrophy. Left ventricular diastolic parameters are consistent with Grade I diastolic dysfunction (impaired relaxation).  2. Right ventricular systolic function is normal. The right ventricular size is normal. There is mildly elevated pulmonary artery systolic pressure. The estimated right ventricular systolic pressure is 41.0 mmHg.  3. Left atrial size was moderately dilated.  4. The mitral valve is degenerative. Mild mitral valve regurgitation. No evidence of mitral stenosis. Moderate mitral annular calcification.  5. The aortic valve is tricuspid. There is moderate calcification of the aortic valve. Aortic valve regurgitation is trivial. Moderate aortic valve stenosis. Aortic valve area, by VTI measures 1.17 cm. Aortic valve mean gradient measures 19.0 mmHg. Aortic valve Vmax measures 3.04 m/s.  6. The inferior vena cava is normal in size with greater than 50% respiratory variability, suggesting right atrial pressure of 3 mmHg. FINDINGS  Left Ventricle: Left ventricular ejection fraction, by estimation, is 65 to 70%. The left ventricle has normal function. The left ventricle has no regional wall motion abnormalities. The left ventricular internal  cavity size was normal in size. There is  moderate concentric left ventricular hypertrophy. Left ventricular diastolic parameters are consistent with Grade I diastolic dysfunction (impaired relaxation). Right Ventricle: The  right ventricular size is normal. No increase in right ventricular wall thickness. Right ventricular systolic function is normal. There is mildly elevated pulmonary artery systolic pressure. The tricuspid regurgitant velocity is 3.00  m/s, and with an assumed right atrial pressure of 5 mmHg, the estimated right ventricular systolic pressure is 41.0 mmHg. Left Atrium: Left atrial size was moderately dilated. Right Atrium: Right atrial size was normal in size. Pericardium: There is no evidence of pericardial effusion. Mitral Valve: The mitral valve is degenerative in appearance. Moderate mitral annular calcification. Mild mitral valve regurgitation. No evidence of mitral valve stenosis. MV peak gradient, 17.6 mmHg. The mean mitral valve gradient is 5.0 mmHg. Tricuspid Valve: The tricuspid valve is normal in structure. Tricuspid valve regurgitation is not demonstrated. No evidence of tricuspid stenosis. Aortic Valve: The aortic valve is tricuspid. There is moderate calcification of the aortic valve. Aortic valve regurgitation is trivial. Moderate aortic stenosis is present. Aortic valve mean gradient measures 19.0 mmHg. Aortic valve peak gradient measures 37.0 mmHg. Aortic valve area, by VTI measures 1.17 cm. Pulmonic Valve: The pulmonic valve was normal in structure. Pulmonic valve regurgitation is not visualized. No evidence of pulmonic stenosis. Aorta: The aortic root is normal in size and structure. Venous: The inferior vena cava is normal in size with greater than 50% respiratory variability, suggesting right atrial pressure of 3 mmHg. IAS/Shunts: No atrial level shunt detected by color flow Doppler.  LEFT VENTRICLE PLAX 2D LVOT diam:     1.90 cm   Diastology LV SV:         70        LV e'  medial:  7.94 cm/s LV SV Index:   43        LV e' lateral: 10.20 cm/s LVOT Area:     2.84 cm  RIGHT VENTRICLE             IVC RV Basal diam:  2.30 cm     IVC diam: 1.40 cm RV S prime:     25.50 cm/s TAPSE (M-mode): 2.0 cm LEFT ATRIUM             Index        RIGHT ATRIUM           Index LA Vol (A2C):   81.4 ml 49.41 ml/m  RA Area:     24.00 cm LA Vol (A4C):   56.8 ml 34.47 ml/m  RA Volume:   77.20 ml  46.86 ml/m LA Biplane Vol: 69.8 ml 42.37 ml/m  AORTIC VALVE AV Area (Vmax):    1.08 cm AV Area (Vmean):   1.12 cm AV Area (VTI):     1.17 cm AV Vmax:           304.00 cm/s AV Vmean:          200.000 cm/s AV VTI:            0.603 m AV Peak Grad:      37.0 mmHg AV Mean Grad:      19.0 mmHg LVOT Vmax:         116.00 cm/s LVOT Vmean:        78.850 cm/s LVOT VTI:          0.248 m LVOT/AV VTI ratio: 0.41  AORTA Ao Root diam: 3.00 cm MITRAL VALVE              TRICUSPID VALVE MV Area VTI:  1.45 cm    TR Peak grad:   36.0  mmHg MV Peak grad: 17.6 mmHg   TR Vmax:        300.00 cm/s MV Mean grad: 5.0 mmHg MV Vmax:      2.10 m/s    SHUNTS MV Vmean:     101.0 cm/s  Systemic VTI:  0.25 m                           Systemic Diam: 1.90 cm Jules Oar MD Electronically signed by Jules Oar MD Signature Date/Time: 11/10/2023/4:52:02 PM    Final        LOS: 3 days   Jennifer Garrett  [facility] Pager on [email or web address]  11/12/2023, 9:19 AM

## 2023-11-13 DIAGNOSIS — G9341 Metabolic encephalopathy: Secondary | ICD-10-CM | POA: Diagnosis not present

## 2023-11-13 DIAGNOSIS — F01518 Vascular dementia, unspecified severity, with other behavioral disturbance: Secondary | ICD-10-CM | POA: Diagnosis not present

## 2023-11-13 DIAGNOSIS — N390 Urinary tract infection, site not specified: Secondary | ICD-10-CM | POA: Diagnosis not present

## 2023-11-13 LAB — GLUCOSE, CAPILLARY: Glucose-Capillary: 76 mg/dL (ref 70–99)

## 2023-11-13 MED ORDER — LORAZEPAM 0.5 MG PO TABS: 0.5000 mg | ORAL_TABLET | Freq: Every day | ORAL | 0 refills | Status: DC | PRN

## 2023-11-13 MED ORDER — DIVALPROEX SODIUM 250 MG PO DR TAB: 250.0000 mg | DELAYED_RELEASE_TABLET | Freq: Two times a day (BID) | ORAL | 0 refills | Status: AC

## 2023-11-13 MED ORDER — CYANOCOBALAMIN 1000 MCG PO TABS: 1000.0000 ug | ORAL_TABLET | Freq: Every day | ORAL | 1 refills | Status: AC

## 2023-11-13 NOTE — Progress Notes (Signed)
 Per MD, pt is medically stable for dc. Spoke to [place] at [facility] [phone] who reports they are not prepared to admit pt until Monday. MD aware of barrier to dc. Primary TOC will f/u Monday.  Paullette Boston, MSW, LCSW [phone] (coverage)

## 2023-11-13 NOTE — Progress Notes (Signed)
 [facility] PROGRESS NOTE   Jennifer Garrett UJW:[id number] DOB: [birth year] DOA: 11/08/2023  PCP: Charle Congo, MD  Brief History: 89 y.o. female with a past medical history of vascular dementia, stage IV breast cancer in remission for the past 3 years, essential hypertension who lives in a memory care unit.  She was sent to [facility] emergency department on 4/21 after she sustained a fall at her memory care unit.  She underwent imaging studies which did not reveal any trauma.  She was thought to be back to her baseline and was sent back to her facility.  Over the next 24 hours she was noted to be more lethargic than usual.  This is quite unlike her according to the daughter who is at the bedside.  She was brought back to the emergency department for further workup.  She was subsequently hospitalized.  Consultants: None  Procedures:  Echocardiogram EEG   Subjective/Interval History: Patient confused as is her baseline.  Seems to be a little upset this morning but unable to tell the reason.  Does not appear to be in any discomfort.   Assessment/Plan:  Acute metabolic encephalopathy MRI brain without any acute process. UA was noted to be abnormal.  Patient started on antibiotics. EEG unremarkable. B12 level borderline low.  Started on supplementation. Folic acid levels normal.  Ammonia level normal.  TSH noted to be normal. Patient has had extensive workup here in the hospital without any significant findings except for possible UTI as a reason for her encephalopathy.  According to daughter she is back to her baseline.  Continue current dose of Depakote .  Acute UTI UA noted to be abnormal.  Patient started on ceftriaxone .  No culture data available.  Some improvement in mentation noted.  She will complete 5 days of ceftriaxone  today.  History of breast cancer in remission/weight loss. Patient's daughter reported weight loss over the last several months despite good  appetite.  CT of the chest abdomen pelvis does not show any acute findings. Could be from poor oral intake. Outpatient monitoring  Thyroid nodule Incidentally detected on CT scan.  TSH is normal.  Considering her vascular dementia she is likely not a candidate for any further testing.  Results communicated to patient's daughter.  She can discuss this with the PCP as well.  Normocytic anemia/thrombocytopenia Hemoglobin is stable.  Acute on chronic kidney disease stage IIIa Renal function is Back to baseline.  History of vascular dementia Continue Aricept  and Depakote .  Swallow evaluation.  Dysphagia diet for now. Dose of Depakote  was increased.    Abnormal EKG mildly elevated troponin No chest pain.  EKG was nonspecific.  Echocardiogram shows normal LVEF without any regional wall motion abnormalities.  Concern for moderate aortic valve stenosis noted.  No further workup at this time.   DVT Prophylaxis: Lovenox  Code Status: Full code Family Communication: Discussed with daughter Disposition Plan: Stable for discharge today after her last dose of antibiotics.      Medications: Scheduled:  vitamin B-12  1,000 mcg Oral Daily   divalproex   250 mg Oral BID   donepezil   10 mg Oral Daily   enoxaparin  (LOVENOX ) injection  40 mg Subcutaneous Q24H   LORazepam   0.5 mg Intravenous Once   Continuous:  cefTRIAXone  (ROCEPHIN )  IV 1 g (11/12/23 1558)   PRN:acetaminophen  **OR** acetaminophen , haloperidol  lactate, hydrALAZINE , LORazepam , ondansetron  **OR** ondansetron  (ZOFRAN ) IV  Antibiotics: Anti-infectives (From admission, onward)    Start     Dose/Rate Route Frequency  Ordered Stop   11/09/23 1600  cefTRIAXone  (ROCEPHIN ) 1 g in sodium chloride  0.9 % 100 mL IVPB        1 g 200 mL/hr over 30 Minutes Intravenous Every 24 hours 11/09/23 1516 11/14/23 1559       Objective:  Vital Signs  Vitals:   11/12/23 2100 11/12/23 2200 11/13/23 0421 11/13/23 0811  BP: (!) 185/70 (!) 122/98  120/83 121/62  Pulse: 63  86 69  Resp: 18  18 18   Temp: 98.4 F (36.9 C)  98.9 F (37.2 C) 97.6 F (36.4 C)  TempSrc: Oral  Oral Oral  SpO2: 98%  96% 98%   No intake or output data in the 24 hours ending 11/13/23 1029  Patient is awake alert.  Mildly upset. Lungs are clear to auscultation bilaterally. S1-S2 is normal regular. Mentation is as before.  No obvious focal neurological deficits.   Lab Results:  Data Reviewed: I have personally reviewed following labs and reports of the imaging studies  CBC: Recent Labs  Lab 11/08/23 1106 11/09/23 1100 11/12/23 0832  WBC 5.3 6.5 5.6  NEUTROABS 3.8  --   --   HGB 9.5* 9.7* 9.5*  HCT 30.2* 31.0* 29.7*  MCV 92.4 90.4 88.9  PLT 134* 210 181    Basic Metabolic Panel: Recent Labs  Lab 11/08/23 1106 11/09/23 1100 11/11/23 0714 11/12/23 0832  NA 140 141 135 139  K 4.6 4.7 4.5 4.8  CL 112* 108 105 110  CO2 17* 24 20* 24  GLUCOSE 102* 85 85 86  BUN 26* 24* 32* 27*  CREATININE 1.14* 1.28* 1.45* 1.11*  CALCIUM  8.2* 9.4 8.2* 8.7*    GFR: CrCl cannot be calculated (Unknown ideal weight.).  Liver Function Tests: Recent Labs  Lab 11/08/23 1106 11/09/23 1100  AST 34 44*  ALT 10 15  ALKPHOS 49 60  BILITOT 0.3 0.6  PROT 5.7* 7.2  ALBUMIN 2.8* 3.3*    CBG: Recent Labs  Lab 11/07/23 1038  GLUCAP 72     Recent Results (from the past 240 hours)  Resp panel by RT-PCR (RSV, Flu A&B, Covid) Anterior Nasal Swab     Status: None   Collection Time: 11/08/23 11:07 AM   Specimen: Anterior Nasal Swab  Result Value Ref Range Status   SARS Coronavirus 2 by RT PCR NEGATIVE NEGATIVE Final   Influenza A by PCR NEGATIVE NEGATIVE Final   Influenza B by PCR NEGATIVE NEGATIVE Final    Comment: (NOTE) The Xpert Xpress SARS-CoV-2/FLU/RSV plus assay is intended as an aid in the diagnosis of influenza from Nasopharyngeal swab specimens and should not be used as a sole basis for treatment. Nasal washings and aspirates are  unacceptable for Xpert Xpress SARS-CoV-2/FLU/RSV testing.  Fact Sheet for Patients: BloggerCourse.com  Fact Sheet for Healthcare Providers: SeriousBroker.it  This test is not yet approved or cleared by the [place]  FDA and has been authorized for detection and/or diagnosis of SARS-CoV-2 by FDA under an Emergency Use Authorization (EUA). This EUA will remain in effect (meaning this test can be used) for the duration of the COVID-19 declaration under Section 564(b)(1) of the Act, 21 U.S.C. section 360bbb-3(b)(1), unless the authorization is terminated or revoked.     Resp Syncytial Virus by PCR NEGATIVE NEGATIVE Final    Comment: (NOTE) Fact Sheet for Patients: BloggerCourse.com  Fact Sheet for Healthcare Providers: SeriousBroker.it  This test is not yet approved or cleared by the [place]  FDA and has been authorized for detection and/or  diagnosis of SARS-CoV-2 by FDA under an Emergency Use Authorization (EUA). This EUA will remain in effect (meaning this test can be used) for the duration of the COVID-19 declaration under Section 564(b)(1) of the Act, 21 U.S.C. section 360bbb-3(b)(1), unless the authorization is terminated or revoked.  Performed at [facility], [address], [place], [place] [address]       Radiology Studies: No results found.      LOS: 4 days   Jeralynn Vaquera [employer] on [email or web address]  11/13/2023, 10:29 AM

## 2023-11-13 NOTE — Progress Notes (Addendum)
 Pt's LFA PIV infiltrated, d/t pt bending arm while abx infusing, refusing to straighten out arm.  Informed NOC RN.  Pt would not allow me to place a "warm compress" over site, d/to hx of dementia.  Pt is A & O x 1 (self) and very combative when touched.  I also informed pharmacy of infiltration and requested they reach out to [facility] RN, Keva, regarding any infiltration interventions.  , I requested NOC RN contact night shift physician, to ask if pt even needs a replacement IV.  Will place VAT team PIV replacement order.

## 2023-11-13 NOTE — Plan of Care (Signed)
  Problem: Clinical Measurements: Goal: Will remain free from infection Outcome: Progressing Goal: Diagnostic test results will improve Outcome: Progressing Goal: Respiratory complications will improve Outcome: Progressing Goal: Cardiovascular complication will be avoided Outcome: Progressing   Problem: Activity: Goal: Risk for activity intolerance will decrease Outcome: Progressing   Problem: Elimination: Goal: Will not experience complications related to bowel motility Outcome: Progressing Goal: Will not experience complications related to urinary retention Outcome: Progressing   Problem: Pain Managment: Goal: General experience of comfort will improve and/or be controlled Outcome: Progressing   Problem: Safety: Goal: Ability to remain free from injury will improve Outcome: Progressing   Problem: Skin Integrity: Goal: Risk for impaired skin integrity will decrease Outcome: Progressing   Problem: Education: Goal: Knowledge of General Education information will improve Description: Including pain rating scale, medication(s)/side effects and non-pharmacologic comfort measures Outcome: Not Progressing   Problem: Health Behavior/Discharge Planning: Goal: Ability to manage health-related needs will improve Outcome: Not Progressing   Problem: Clinical Measurements: Goal: Ability to maintain clinical measurements within normal limits will improve Outcome: Not Progressing   Problem: Nutrition: Goal: Adequate nutrition will be maintained Outcome: Not Progressing   Problem: Coping: Goal: Level of anxiety will decrease Outcome: Not Progressing

## 2023-11-14 DIAGNOSIS — G9341 Metabolic encephalopathy: Secondary | ICD-10-CM | POA: Diagnosis not present

## 2023-11-14 LAB — GLUCOSE, CAPILLARY: Glucose-Capillary: 82 mg/dL (ref 70–99)

## 2023-11-14 NOTE — Discharge Summary (Addendum)
 [facility]  Physician Discharge Summary   Patient ID: Jennifer Garrett MRN: [id number] DOB/AGE: 1931/12/20 89 y.o.  Admit date: 11/08/2023 Discharge date:   11/14/2023   PCP: Charle Congo, MD  DISCHARGE DIAGNOSES:    Acute metabolic encephalopathy Urinary tract infection   Malignant neoplasm of lower-inner quadrant of right breast of female, estrogen receptor negative (HCC)   Vascular dementia (HCC)   RECOMMENDATIONS FOR OUTPATIENT FOLLOW UP: Outpatient follow-up with PCP in 1 to 2 weeks Speech therapy to evaluate swallow function in 1 week and consider diet advancement Further workup for the thyroid nodule will be deferred to primary care provider.   Home Health: None Equipment/Devices: None  CODE STATUS: Full code  DISCHARGE CONDITION: fair  Diet recommendation: Dysphagia 2 diet with thin liquids  INITIAL HISTORY: 89 y.o. female with a past medical history of vascular dementia, stage IV breast cancer in remission for the past 3 years, essential hypertension who lives in a memory care unit.  She was sent to [facility] emergency department on 4/21 after she sustained a fall at her memory care unit.  She underwent imaging studies which did not reveal any trauma.  She was thought to be back to her baseline and was sent back to her facility.  Over the next 24 hours she was noted to be more lethargic than usual.  This is quite unlike her according to the daughter who is at the bedside.  She was brought back to the emergency department for further workup.  She was subsequently hospitalized.   Consultants: None   Procedures:  Echocardiogram EEG  HOSPITAL COURSE:   Acute metabolic encephalopathy MRI brain without any acute process. UA was noted to be abnormal.  Patient started on antibiotics. EEG unremarkable. B12 level borderline low.  Started on supplementation. Folic acid levels normal.  Ammonia level normal.  TSH noted to be normal. Patient has had  extensive workup here in the hospital without any significant findings except for possible UTI as a reason for her encephalopathy.  According to daughter she is back to her baseline.     Acute UTI UA noted to be abnormal.  Patient started on ceftriaxone .  No culture data available.  Some improvement in mentation noted.  She completed a 5-day course.   History of breast cancer in remission/weight loss. Patient's daughter reported weight loss over the last several months despite good appetite.  CT of the chest abdomen pelvis does not show any acute findings. Could be from poor oral intake. Outpatient monitoring   Thyroid nodule Incidentally detected on CT scan.  TSH is normal.  Considering her vascular dementia she is likely not a candidate for any further testing.  Results communicated to patient's daughter.  She can discuss this with the PCP as well.   Normocytic anemia/thrombocytopenia Hemoglobin is stable.   Acute on chronic kidney disease stage IIIa Renal function is Back to baseline.   History of vascular dementia Continue Aricept  and Depakote .  Swallow evaluation.  Dysphagia diet for now. Dose of Depakote  was increased.     Abnormal EKG mildly elevated troponin No chest pain.  EKG was nonspecific.  Echocardiogram shows normal LVEF without any regional wall motion abnormalities.  Concern for moderate aortic valve stenosis noted.  No further workup at this time.    Patient stable.  Okay for discharge back to her memory care unit.  PERTINENT LABS:  The results of significant diagnostics from this hospitalization (including imaging, microbiology, ancillary and laboratory) are listed  below for reference.    Microbiology: Recent Results (from the past 240 hours)  Resp panel by RT-PCR (RSV, Flu A&B, Covid) Anterior Nasal Swab     Status: None   Collection Time: 11/08/23 11:07 AM   Specimen: Anterior Nasal Swab  Result Value Ref Range Status   SARS Coronavirus 2 by RT PCR  NEGATIVE NEGATIVE Final   Influenza A by PCR NEGATIVE NEGATIVE Final   Influenza B by PCR NEGATIVE NEGATIVE Final    Comment: (NOTE) The Xpert Xpress SARS-CoV-2/FLU/RSV plus assay is intended as an aid in the diagnosis of influenza from Nasopharyngeal swab specimens and should not be used as a sole basis for treatment. Nasal washings and aspirates are unacceptable for Xpert Xpress SARS-CoV-2/FLU/RSV testing.  Fact Sheet for Patients: BloggerCourse.com  Fact Sheet for Healthcare Providers: SeriousBroker.it  This test is not yet approved or cleared by the [place]  FDA and has been authorized for detection and/or diagnosis of SARS-CoV-2 by FDA under an Emergency Use Authorization (EUA). This EUA will remain in effect (meaning this test can be used) for the duration of the COVID-19 declaration under Section 564(b)(1) of the Act, 21 U.S.C. section 360bbb-3(b)(1), unless the authorization is terminated or revoked.     Resp Syncytial Virus by PCR NEGATIVE NEGATIVE Final    Comment: (NOTE) Fact Sheet for Patients: BloggerCourse.com  Fact Sheet for Healthcare Providers: SeriousBroker.it  This test is not yet approved or cleared by the [place]  FDA and has been authorized for detection and/or diagnosis of SARS-CoV-2 by FDA under an Emergency Use Authorization (EUA). This EUA will remain in effect (meaning this test can be used) for the duration of the COVID-19 declaration under Section 564(b)(1) of the Act, 21 U.S.C. section 360bbb-3(b)(1), unless the authorization is terminated or revoked.  Performed at [facility], [address], [place], [place] [address]      Labs:   Basic Metabolic Panel: Recent Labs  Lab 11/08/23 1106 11/09/23 1100 11/11/23 0714 11/12/23 0832  NA 140 141 135 139  K 4.6 4.7 4.5 4.8  CL 112* 108 105 110  CO2 17* 24 20* 24  GLUCOSE  102* 85 85 86  BUN 26* 24* 32* 27*  CREATININE 1.14* 1.28* 1.45* 1.11*  CALCIUM  8.2* 9.4 8.2* 8.7*   Liver Function Tests: Recent Labs  Lab 11/08/23 1106 11/09/23 1100  AST 34 44*  ALT 10 15  ALKPHOS 49 60  BILITOT 0.3 0.6  PROT 5.7* 7.2  ALBUMIN 2.8* 3.3*    Recent Labs  Lab 11/09/23 1100  AMMONIA 13   CBC: Recent Labs  Lab 11/08/23 1106 11/09/23 1100 11/12/23 0832  WBC 5.3 6.5 5.6  NEUTROABS 3.8  --   --   HGB 9.5* 9.7* 9.5*  HCT 30.2* 31.0* 29.7*  MCV 92.4 90.4 88.9  PLT 134* 210 181    CBG: Recent Labs  Lab 11/07/23 1038 11/13/23 2121  GLUCAP 72 76     IMAGING STUDIES ECHOCARDIOGRAM COMPLETE Result Date: 11/10/2023    ECHOCARDIOGRAM REPORT   Patient Name:   MILADA DESFORGES Date of Exam: 11/10/2023 Medical Rec #:  [id number]     Height:       65.0 in Accession #:    [id number]    Weight:       130.1 lb Date of Birth:  1932/03/12     BSA:          1.648 m Patient Age:    47 years  BP:           141/130 mmHg Patient Gender: F             HR:           69 bpm. Exam Location:  Inpatient Procedure: 2D Echo (Both Spectral and Color Flow Doppler were utilized during            procedure). Indications:    elevated troponin  History:        Patient has prior history of Echocardiogram examinations, most                 recent 03/23/2022. CAD, chronic kidney disease; Risk                 Factors:Dyslipidemia and Hypertension.  Sonographer:    Dione Franks RDCS Referring Phys: [id number] Roxana Copier  Sonographer Comments: Image acquisition challenging due to uncooperative patient. IMPRESSIONS  1. Left ventricular ejection fraction, by estimation, is 65 to 70%. The left ventricle has normal function. The left ventricle has no regional wall motion abnormalities. There is moderate concentric left ventricular hypertrophy. Left ventricular diastolic parameters are consistent with Grade I diastolic dysfunction (impaired relaxation).  2. Right ventricular systolic function is  normal. The right ventricular size is normal. There is mildly elevated pulmonary artery systolic pressure. The estimated right ventricular systolic pressure is 41.0 mmHg.  3. Left atrial size was moderately dilated.  4. The mitral valve is degenerative. Mild mitral valve regurgitation. No evidence of mitral stenosis. Moderate mitral annular calcification.  5. The aortic valve is tricuspid. There is moderate calcification of the aortic valve. Aortic valve regurgitation is trivial. Moderate aortic valve stenosis. Aortic valve area, by VTI measures 1.17 cm. Aortic valve mean gradient measures 19.0 mmHg. Aortic valve Vmax measures 3.04 m/s.  6. The inferior vena cava is normal in size with greater than 50% respiratory variability, suggesting right atrial pressure of 3 mmHg. FINDINGS  Left Ventricle: Left ventricular ejection fraction, by estimation, is 65 to 70%. The left ventricle has normal function. The left ventricle has no regional wall motion abnormalities. The left ventricular internal cavity size was normal in size. There is  moderate concentric left ventricular hypertrophy. Left ventricular diastolic parameters are consistent with Grade I diastolic dysfunction (impaired relaxation). Right Ventricle: The right ventricular size is normal. No increase in right ventricular wall thickness. Right ventricular systolic function is normal. There is mildly elevated pulmonary artery systolic pressure. The tricuspid regurgitant velocity is 3.00  m/s, and with an assumed right atrial pressure of 5 mmHg, the estimated right ventricular systolic pressure is 41.0 mmHg. Left Atrium: Left atrial size was moderately dilated. Right Atrium: Right atrial size was normal in size. Pericardium: There is no evidence of pericardial effusion. Mitral Valve: The mitral valve is degenerative in appearance. Moderate mitral annular calcification. Mild mitral valve regurgitation. No evidence of mitral valve stenosis. MV peak gradient, 17.6  mmHg. The mean mitral valve gradient is 5.0 mmHg. Tricuspid Valve: The tricuspid valve is normal in structure. Tricuspid valve regurgitation is not demonstrated. No evidence of tricuspid stenosis. Aortic Valve: The aortic valve is tricuspid. There is moderate calcification of the aortic valve. Aortic valve regurgitation is trivial. Moderate aortic stenosis is present. Aortic valve mean gradient measures 19.0 mmHg. Aortic valve peak gradient measures 37.0 mmHg. Aortic valve area, by VTI measures 1.17 cm. Pulmonic Valve: The pulmonic valve was normal in structure. Pulmonic valve regurgitation is not visualized. No evidence of pulmonic stenosis. Aorta:  The aortic root is normal in size and structure. Venous: The inferior vena cava is normal in size with greater than 50% respiratory variability, suggesting right atrial pressure of 3 mmHg. IAS/Shunts: No atrial level shunt detected by color flow Doppler.  LEFT VENTRICLE PLAX 2D LVOT diam:     1.90 cm   Diastology LV SV:         70        LV e' medial:  7.94 cm/s LV SV Index:   43        LV e' lateral: 10.20 cm/s LVOT Area:     2.84 cm  RIGHT VENTRICLE             IVC RV Basal diam:  2.30 cm     IVC diam: 1.40 cm RV S prime:     25.50 cm/s TAPSE (M-mode): 2.0 cm LEFT ATRIUM             Index        RIGHT ATRIUM           Index LA Vol (A2C):   81.4 ml 49.41 ml/m  RA Area:     24.00 cm LA Vol (A4C):   56.8 ml 34.47 ml/m  RA Volume:   77.20 ml  46.86 ml/m LA Biplane Vol: 69.8 ml 42.37 ml/m  AORTIC VALVE AV Area (Vmax):    1.08 cm AV Area (Vmean):   1.12 cm AV Area (VTI):     1.17 cm AV Vmax:           304.00 cm/s AV Vmean:          200.000 cm/s AV VTI:            0.603 m AV Peak Grad:      37.0 mmHg AV Mean Grad:      19.0 mmHg LVOT Vmax:         116.00 cm/s LVOT Vmean:        78.850 cm/s LVOT VTI:          0.248 m LVOT/AV VTI ratio: 0.41  AORTA Ao Root diam: 3.00 cm MITRAL VALVE              TRICUSPID VALVE MV Area VTI:  1.45 cm    TR Peak grad:   36.0 mmHg MV  Peak grad: 17.6 mmHg   TR Vmax:        300.00 cm/s MV Mean grad: 5.0 mmHg MV Vmax:      2.10 m/s    SHUNTS MV Vmean:     101.0 cm/s  Systemic VTI:  0.25 m                           Systemic Diam: 1.90 cm Jules Oar MD Electronically signed by Jules Oar MD Signature Date/Time: 11/10/2023/4:52:02 PM    Final    EEG adult Result Date: 11/09/2023 Arleene Lack, MD     11/09/2023  2:27 PM Patient Name: DENEEN NOTH MRN: [id number] Epilepsy Attending: Arleene Lack Referring Physician/Provider: Maylene Spear, MD Date:  11/09/2023 Duration: 30.30 mins Patient history: 89yo F with ams. EEG to evaluate for seizure Level of alertness: Awake, asleep AEDs during EEG study: VPA, Ativan  Technical aspects: This EEG study was done with scalp electrodes positioned according to the 10-20 International system of electrode placement. Electrical activity was reviewed with band pass filter of 1-70Hz , sensitivity of 7 uV/mm, display speed of 96mm/sec with  a 60Hz  notched filter applied as appropriate. EEG data were recorded continuously and digitally stored.  Video monitoring was available and reviewed as appropriate. Description: No clear posterior dominant rhythm was seen. Sleep was characterized by vertex waves, sleep spindles (12 to 14 Hz), maximal frontocentral region. EEG showed continuous generalized 3 to 6 Hz theta-delta slowing. Hyperventilation and photic stimulation were not performed.   ABNORMALITY - Continuous slow, generalized IMPRESSION: This study is suggestive of moderate diffuse encephalopathy. No seizures or epileptiform discharges were seen throughout the recording. Arleene Lack   MR BRAIN WO CONTRAST Result Date: 11/09/2023 CLINICAL DATA:  Mental status change, recent fall. EXAM: MRI HEAD WITHOUT CONTRAST TECHNIQUE: Multiplanar, multiecho pulse sequences of the brain and surrounding structures were obtained without intravenous contrast. COMPARISON:  CT head 11/08/2023. FINDINGS: Brain: No  acute infarct. No evidence of intracranial hemorrhage. Scattered and confluent T2/FLAIR hyperintensity in the periventricular and subcortical white matter. 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 Additional remote lacunar infarcts in the bilateral thalami. Moderate parenchymal volume loss. No mass lesion or midline shift. Cerebellum is unremarkable. Normal appearance of midline structures. The basilar cisterns are patent. No extra-axial fluid collections. Ventricles: Prominence of the lateral ventricles suggestive of underlying parenchymal volume loss. Vascular: Skull base flow voids are visualized. Skull and upper cervical spine: Degenerative changes in the visualized upper cervical spine. The visualized calvarium is unremarkable. Sinuses/Orbits: Orbits are symmetric. Paranasal sinuses are clear. Other: Trace fluid in the right mastoid air cells. IMPRESSION: No acute intracranial abnormality. Moderate parenchymal volume loss and moderate chronic microvascular ischemic changes. 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 Additional remote lacunar infarcts in the bilateral thalami. Electronically Signed   By: Denny Flack M.D.   On: 11/09/2023 09:19   CT CHEST ABDOMEN PELVIS W CONTRAST Result Date: 11/08/2023 CLINICAL DATA:  Weight loss.  History of breast cancer. EXAM: CT CHEST, ABDOMEN, AND PELVIS WITH CONTRAST TECHNIQUE: Multidetector CT imaging of the chest, abdomen and pelvis was performed following the standard protocol during bolus administration of intravenous contrast. RADIATION DOSE REDUCTION: This exam was performed according to the departmental dose-optimization program which includes automated exposure control, adjustment of the mA and/or kV according to patient size and/or use of iterative reconstruction technique. CONTRAST:  75mL OMNIPAQUE  IOHEXOL  350 MG/ML SOLN COMPARISON:  September 28, 2019. FINDINGS: CT CHEST FINDINGS Cardiovascular: Mild cardiomegaly. No evidence of thoracic aortic  dissection or aneurysm. No pericardial effusion. Coronary artery calcifications are noted. Mediastinum/Nodes: 3 cm complex left thyroid nodule is noted. No adenopathy. Esophagus is unremarkable. Lungs/Pleura: No pneumothorax or pleural effusion is noted. Minimal bibasilar subsegmental atelectasis or scarring is noted. Musculoskeletal: No chest wall mass or suspicious bone lesions identified. CT ABDOMEN PELVIS FINDINGS Hepatobiliary: No focal liver abnormality is seen. No gallstones, gallbladder wall thickening, or biliary dilatation. Pancreas: Unremarkable. No pancreatic ductal dilatation or surrounding inflammatory changes. Spleen: Normal in size without focal abnormality. Adrenals/Urinary Tract: Adrenal glands appear normal. Stable left renal cysts are noted for which no further follow-up is required. No hydronephrosis or renal obstruction. Urinary bladder is unremarkable. Stomach/Bowel: The stomach is unremarkable. There is no evidence of bowel obstruction or inflammation. The appendix is not visualized. Vascular/Lymphatic: Aortic atherosclerosis. No enlarged abdominal or pelvic lymph nodes. Reproductive: Status post hysterectomy. No adnexal masses. Other: No ascites or hernia. Musculoskeletal: No acute or significant osseous findings. IMPRESSION: 3 cm complex left thyroid nodule. Recommend thyroid US . (Ref: J Am Coll Radiol. 2015 Feb;12(2): 143-50). Coronary artery calcifications are noted. No other  acute abnormality seen in the chest, abdomen or pelvis. Aortic Atherosclerosis (ICD10-I70.0). Electronically Signed   By: Rosalene Colon M.D.   On: 11/08/2023 16:05   CT Head Wo Contrast Result Date: 11/08/2023 CLINICAL DATA:  Altered mental status. EXAM: CT HEAD WITHOUT CONTRAST TECHNIQUE: Contiguous axial images were obtained from the base of the skull through the vertex without intravenous contrast. RADIATION DOSE REDUCTION: This exam was performed according to the departmental dose-optimization program which  includes automated exposure control, adjustment of the mA and/or kV according to patient size and/or use of iterative reconstruction technique. COMPARISON:  Head CT dated 11/07/2023. FINDINGS: Brain: Moderate age-related atrophy and chronic microvascular ischemic changes. There is no acute intracranial hemorrhage. No mass effect or midline shift. No extra-axial fluid collection. Vascular: No hyperdense vessel or unexpected calcification. Skull: Normal. Negative for fracture or focal lesion. Sinuses/Orbits: No acute finding. Other: None IMPRESSION: 1. No acute intracranial pathology. 2. Moderate age-related atrophy and chronic microvascular ischemic changes. Electronically Signed   By: Angus Bark M.D.   On: 11/08/2023 13:38   DG Chest Port 1 View Result Date: 11/08/2023 CLINICAL DATA:  Shortness of breath. EXAM: PORTABLE CHEST 1 VIEW COMPARISON:  November 07, 2023. FINDINGS: The heart size and mediastinal contours are within normal limits. Both lungs are clear. The visualized skeletal structures are unremarkable. IMPRESSION: No active disease. Electronically Signed   By: Rosalene Colon M.D.   On: 11/08/2023 12:57   CT CERVICAL SPINE WO CONTRAST Result Date: 11/07/2023 CLINICAL DATA:  Status post fall. EXAM: CT CERVICAL SPINE WITHOUT CONTRAST TECHNIQUE: Multidetector CT imaging of the cervical spine was performed without intravenous contrast. Multiplanar CT image reconstructions were also generated. RADIATION DOSE REDUCTION: This exam was performed according to the departmental dose-optimization program which includes automated exposure control, adjustment of the mA and/or kV according to patient size and/or use of iterative reconstruction technique. COMPARISON:  February 18, 2023 FINDINGS: Alignment: Normal. Skull base and vertebrae: No acute fracture. No primary bone lesion or focal pathologic process. Soft tissues and spinal canal: No prevertebral fluid or swelling. No visible canal hematoma. Disc levels:  Mild multilevel endplate sclerosis is seen throughout the cervical spine. Mild anterior osteophyte formation and posterior bony spurring are also noted at the level of C5-C6. Mild to moderate severity multilevel intervertebral disc space narrowing is seen. This is most prominent at the level of C5-C6. Moderate to marked severity bilateral multilevel facet joint hypertrophy is noted. Upper chest: There is evidence of paraseptal and centrilobular emphysematous lung disease. Other: Large bilateral thyroid nodules are seen. IMPRESSION: 1. No acute cervical spine fracture or subluxation. 2. Multilevel degenerative changes, most prominent at the level of C5-C6. Electronically Signed   By: Virgle Grime M.D.   On: 11/07/2023 20:24   CT HEAD WO CONTRAST Result Date: 11/07/2023 CLINICAL DATA:  Status post fall. EXAM: CT HEAD WITHOUT CONTRAST TECHNIQUE: Contiguous axial images were obtained from the base of the skull through the vertex without intravenous contrast. RADIATION DOSE REDUCTION: This exam was performed according to the departmental dose-optimization program which includes automated exposure control, adjustment of the mA and/or kV according to patient size and/or use of iterative reconstruction technique. COMPARISON:  May 13, 2023 FINDINGS: Brain: There is generalized cerebral atrophy with widening of the extra-axial spaces and ventricular dilatation. There are areas of decreased attenuation within the white matter tracts of the supratentorial brain, consistent with microvascular disease changes. Vascular: No hyperdense vessel or unexpected calcification. Skull: Normal. Negative for fracture or  focal lesion. Sinuses/Orbits: No acute finding. Other: None. IMPRESSION: 1. No acute intracranial abnormality. 2. Generalized cerebral atrophy with chronic white matter small vessel ischemic changes. Electronically Signed   By: Virgle Grime M.D.   On: 11/07/2023 20:19   DG Pelvis Portable Result Date:  11/07/2023 CLINICAL DATA:  fall EXAM: PORTABLE PELVIS 1-2 VIEWS COMPARISON:  October 14, 2022 FINDINGS: Osteopenia. No acute, displaced fracture or dislocation. Multilevel degenerative disc disease of the spine. Soft tissues are unremarkable. IMPRESSION: While no acute, displaced fracture or dislocation was visualized, underlying osteopenia decreases the sensitivity for nondisplaced fracture using plain radiography. If the patient is unable to bear weight, or there is high clinical suspicion, a follow-up CT should be considered for further evaluation. Electronically Signed   By: Rance Burrows M.D.   On: 11/07/2023 15:21   DG Chest Port 1 View Result Date: 11/07/2023 CLINICAL DATA:  Fall. EXAM: PORTABLE CHEST 1 VIEW COMPARISON:  10/14/2022. FINDINGS: Limited evaluation due to compromised positioning and rotation. Patient's chin obscures evaluation of the upper mediastinum and right upper lung. Cardiac silhouette is grossly within normal limits allowing for rotation and shallow inspiration. Aortic atherosclerosis. No focal consolidation, large pleural effusion, or pneumothorax identified. Surgical clips in the right axilla. Remote left-sided rib fractures. No acute osseous abnormality identified. IMPRESSION: Significantly limited evaluation. No acute abnormality identified within these limitations. Electronically Signed   By: Mannie Seek M.D.   On: 11/07/2023 11:49    DISCHARGE EXAMINATION: Vitals:   11/13/23 1945 11/14/23 0025 11/14/23 0454 11/14/23 0842  BP: (!) 157/53 (!) 112/48 131/79 (!) 124/55  Pulse: 63  60 64  Resp: 18 16 16 18   Temp: 98 F (36.7 C) 98.1 F (36.7 C) 98.4 F (36.9 C) 98.3 F (36.8 C)  TempSrc: Oral   Oral  SpO2: 100% 99% 100% 98%   General appearance: Awake alert.  In no distress.  Distracted.  Pleasant this morning Resp: Clear to auscultation bilaterally.  Normal effort Cardio: S1-S2 is normal regular.  No S3-S4.  No rubs murmurs or bruit GI: Abdomen is soft.   Nontender nondistended.  Bowel sounds are present normal.  No masses organomegaly   DISPOSITION: Back to her memory care unit  Discharge Instructions     Call MD for:  difficulty breathing, headache or visual disturbances   Complete by: As directed    Call MD for:  extreme fatigue   Complete by: As directed    Call MD for:  persistant dizziness or light-headedness   Complete by: As directed    Call MD for:  persistant nausea and vomiting   Complete by: As directed    Call MD for:  severe uncontrolled pain   Complete by: As directed    Call MD for:  temperature >100.4   Complete by: As directed    Discharge instructions   Complete by: As directed    Please review instructions on the discharge summary.  You were cared for by a hospitalist during your hospital stay. If you have any questions about your discharge medications or the care you received while you were in the hospital after you are discharged, you can call the unit and asked to speak with the hospitalist on call if the hospitalist that took care of you is not available. Once you are discharged, your primary care physician will handle any further medical issues. Please note that NO REFILLS for any discharge medications will be authorized once you are discharged, as it is imperative that you  return to your primary care physician (or establish a relationship with a primary care physician if you do not have one) for your aftercare needs so that they can reassess your need for medications and monitor your lab values. If you do not have a primary care physician, you can call [phone] for a physician referral.   Increase activity slowly   Complete by: As directed    No wound care   Complete by: As directed          Allergies as of 11/14/2023       Reactions   Lyrica [pregabalin] Swelling   Not listed on the [facility]   Feldene [piroxicam] Rash   Not listed on the [facility]   Orudis [ketoprofen] Rash   Not listed on the [facility]   Vibramycin  [doxycycline Calcium ] Hives, Rash   Not listed on the [facility]   Zestril [lisinopril] Other (See Comments)   Light sensitivity   Not listed on the [facility]        Medication List     TAKE these medications    aspirin  81 MG chewable tablet Chew 81 mg by mouth daily.   cyanocobalamin  1000 MCG tablet Take 1 tablet (1,000 mcg total) by mouth daily.   divalproex  250 MG DR tablet Commonly known as: DEPAKOTE  Take 1 tablet (250 mg total) by mouth 2 (two) times daily. What changed:  medication strength how much to take   donepezil  10 MG tablet Commonly known as: ARICEPT  Take 10 mg by mouth daily.   LORazepam  0.5 MG tablet Commonly known as: ATIVAN  Take 1 tablet (0.5 mg total) by mouth daily as needed (agitation).   lovastatin 40 MG tablet Commonly known as: MEVACOR Take 40 mg by mouth daily.   olmesartan 40 MG tablet Commonly known as: BENICAR Take 40 mg by mouth daily.   potassium chloride  SA 20 MEQ tablet Commonly known as: KLOR-CON  M Take 20 mEq by mouth daily.          Follow-up Information     Charle Congo, MD. Schedule an appointment as soon as possible for a visit in 1 week(s).   Specialty: Internal Medicine Why: post hospitalization follow up Contact information: [address] [place] [address] [phone]                 TOTAL DISCHARGE TIME: 35 mins  Arlenis Blaydes Lyndon Santiago  [facility] Pager on [email or web address]  11/14/2023, 9:02 AM

## 2023-11-14 NOTE — Plan of Care (Signed)

## 2023-11-14 NOTE — TOC Transition Note (Signed)
 Transition of Care [facility]) - Discharge Note   Patient Details  Name: Jennifer Garrett MRN: [id number] Date of Birth: [birth year]  [facility] CM/SW Contact:  Albertia Carvin A [place], LCSW Phone Number: 11/14/2023, 12:00 PM   Clinical Narrative:     Pt DC back to her LTC at [facility].   CSW notified pt's daughter Daivd Dub of discharge.   DC Summary and FL2 sent to facility.  Ambulance transport scheduled for discharge.   RN gave report to, [phone]   TOC will continue to follow.   Final next level of care: Skilled Nursing Facility Barriers to Discharge: Barriers Resolved   Patient Goals and CMS Choice            Discharge Placement              Patient chooses bed at:  [facility]) Patient to be transferred to facility by: [facility] Name of family member notified: Elycia Rothschild Patient and family notified of of transfer: 11/14/23  Discharge Plan and Services Additional resources added to the After Visit Summary for                                       Social Drivers of Health (SDOH) Interventions SDOH Screenings   Tobacco Use: Medium Risk (11/08/2023)     Readmission Risk Interventions     No data to display

## 2023-11-14 NOTE — Progress Notes (Signed)
 1053- Called and gave report to [employer], Sports coach. All questions were answered, offer to leave a good call back number but stated that it was not needed. Discharge paperwork done and printed.

## 2023-11-14 NOTE — [place] FL2 (Addendum)
 [facility]  MEDICAID FL2 LEVEL OF CARE FORM     IDENTIFICATION  Patient Name: Jennifer Garrett Birthdate: [birth year] Sex: female Admission Date (Current Location): 11/08/2023  [facility] and [place] Number:  Producer, television/film/video and Address:  The [place], [address], [place], [place] [address]      Provider Number: [id number]  Attending Physician Name and Address:  Maylene Spear, MD  Relative Name and Phone Number:  Kehly, Bocock (Daughter)  [phone]    Current Level of Care: Hospital Recommended Level of Care: Memory Care Prior Approval Number:    Date Approved/Denied:   PASRR Number:    Discharge Plan: Other (Comment) (Memory Care)    Current Diagnoses: Patient Active Problem List   Diagnosis Date Noted   Acute metabolic encephalopathy 11/08/2023   Type 2 diabetes mellitus with complication, without long-term current use of insulin  (HCC) 03/22/2022   Vascular dementia (HCC) 03/22/2022   Seizure-like activity (HCC) 03/22/2022   Syncope and collapse with seizure like activity and bradycardia 03/22/2022   Acute renal failure superimposed on stage 3b chronic kidney disease (HCC) 03/22/2022   Normocytic anemia 03/22/2022   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/12/2017   Preop cardiovascular exam 03/18/2017   Essential hypertension 03/18/2017   Dyslipidemia 03/18/2017   Coronary artery disease involving native coronary artery of native heart without angina pectoris 03/18/2017   Bruit 03/18/2017   Malignant neoplasm metastatic to lung (HCC) 06/23/2016   Malignant neoplasm of lower-inner quadrant of right breast of female, estrogen receptor negative (HCC) 01/22/2016    Orientation RESPIRATION BLADDER Height & Weight      (disoriented x4)  Normal Incontinent Weight:   Height:     BEHAVIORAL SYMPTOMS/MOOD NEUROLOGICAL BOWEL NUTRITION STATUS      Continent Diet- no table salt, finger food  AMBULATORY STATUS COMMUNICATION OF NEEDS  Skin   Extensive Assist Verbally Other (Comment) (Wound / Incision (Open or Dehisced) 11/09/23 Arm Anterior;Left;Lower;Proximal skin tear)                       Personal Care Assistance Level of Assistance  Bathing, Feeding, Dressing Bathing Assistance: Maximum assistance Feeding assistance: Maximum assistance Dressing Assistance: Maximum assistance     Functional Limitations Info  Sight, Hearing, Speech Sight Info: Impaired Hearing Info: Adequate Speech Info: Adequate    SPECIAL CARE FACTORS FREQUENCY                       Contractures Contractures Info: Not present    Additional Factors Info  Code Status, Allergies Code Status Info: FULL Allergies Info: Lyrica (Pregabalin)  Feldene (Piroxicam)  Orudis (Ketoprofen)  Vibramycin (Doxycycline Calcium )  Zestril (Lisinopril)           Current Medications (11/14/2023):  This is the current hospital active medication list Current Facility-Administered Medications  Medication Dose Route Frequency Provider Last Rate Last Admin   acetaminophen  (TYLENOL ) tablet 650 mg  650 mg Oral Q6H PRN Krishnan, Gokul, MD   650 mg at 11/13/23 4010   Or   acetaminophen  (TYLENOL ) suppository 650 mg  650 mg Rectal Q6H PRN Krishnan, Gokul, MD       cyanocobalamin  (VITAMIN B12) tablet 1,000 mcg  1,000 mcg Oral Daily Krishnan, Gokul, MD   1,000 mcg at 11/14/23 1012   divalproex  (DEPAKOTE ) DR tablet 250 mg  250 mg Oral BID Krishnan, Gokul, MD   250 mg at 11/14/23 1012   donepezil  (ARICEPT ) tablet 10  mg  10 mg Oral Daily Krishnan, Gokul, MD   10 mg at 11/14/23 1012   enoxaparin  (LOVENOX ) injection 40 mg  40 mg Subcutaneous Q24H Krishnan, Gokul, MD   40 mg at 11/12/23 1550   haloperidol  lactate (HALDOL ) injection 2 mg  2 mg Intramuscular Q6H PRN Krishnan, Gokul, MD       hydrALAZINE  (APRESOLINE ) injection 5 mg  5 mg Intravenous Q6H PRN Krishnan, Gokul, MD   5 mg at 11/12/23 2115   LORazepam  (ATIVAN ) injection 0.5 mg  0.5 mg Intravenous Once  Krishnan, Gokul, MD       LORazepam  (ATIVAN ) tablet 0.5 mg  0.5 mg Oral Daily PRN Krishnan, Gokul, MD   0.5 mg at 11/09/23 1234   ondansetron  (ZOFRAN ) tablet 4 mg  4 mg Oral Q6H PRN Krishnan, Gokul, MD       Or   ondansetron  (ZOFRAN ) injection 4 mg  4 mg Intravenous Q6H PRN Krishnan, Gokul, MD         Discharge Medications: Please see discharge summary for a list of discharge medications.  Relevant Imaging Results:  Relevant Lab Results:   Additional Information [id number]  Riane Rung A [place], LCSW

## 2023-11-14 NOTE — Progress Notes (Signed)
 Pt left with all belongings and paperwork

## 2023-11-14 NOTE — Progress Notes (Signed)
 Speech Language Pathology Treatment: Dysphagia  Patient Details Name: Jennifer Garrett MRN: [id number] DOB: May 07, 1932 Today's Date: 11/14/2023 Time: 4098-1191 SLP Time Calculation (min) (ACUTE ONLY): 10 min  Assessment / Plan / Recommendation Clinical Impression  Skilled therapy session focused on dysphagia goals. SLP facilitated session by observing patient during consumption of thin liquids, puree and D3 solids. Patient with mildly prolonged mastication during solids (patient is edentulous- no dentures/family in room), however adequate clearance with use of liquid wash. No s/sx of aspiration throughout all trials. Recommend upgrade to D2/thin liquids with full supervision for compensatory strategies and physical assistance.    HPI HPI: Patient is an 89 y.o. female with PMH: vascular dementia (lives at memory care ALF), stage IV breast cancer in remission past 3 years, essential HTN. She presented to [facility] ED on 11/07/23  after a fall. Imaging did not reveal any trauma. She was sent back to her ALF but then over the next 24 hours, she became more lethargic than usual. She returned to ED, at [facility] on 11/08/23. Vital signs stable, blood  work not suggesting of infection, afebrile, CT head negative for acute findings, CXR negative for acute findings      SLP Plan  Continue with current plan of care      Recommendations for follow up therapy are one component of a multi-disciplinary discharge planning process, led by the attending physician.  Recommendations may be updated based on patient status, additional functional criteria and insurance authorization.    Recommendations  Diet recommendations: Dysphagia 2 (fine chop);Thin liquid Liquids provided via: Cup;Straw;Teaspoon Medication Administration: Crushed with puree Supervision: Full supervision/cueing for compensatory strategies;Trained caregiver to feed patient;Staff to assist with self feeding Compensations: Slow rate;Small sips/bites;Minimize  environmental distractions;Follow solids with liquid Postural Changes and/or Swallow Maneuvers: Seated upright 90 degrees        Oral care BID;Staff/trained caregiver to provide oral care   Frequent or constant Supervision/Assistance Dysphagia, unspecified (R13.10)     Continue with current plan of care     Cameren Odwyer M.A., CCC-SLP 11/14/2023, 10:03 AM

## 2023-11-14 NOTE — TOC Progression Note (Addendum)
 Transition of Care [facility]) - Progression Note    Patient Details  Name: MAR SCHWABAUER MRN: [id number] Date of Birth: 1931-08-14  [facility] CM/SW Contact  Zyron Deeley A [place], LCSW Phone Number: 11/14/2023, 11:08 AM  Clinical Narrative:     Update 1118 Spoke with pt's daughter Daivd Dub, informed of DC back to [facility]. She requested ambulance transport for pt and would see pt once pt is back at facility.     CSW made 2 attempts to reach pt's daughter to provide update for discharge. Left voicemail with contact information to reach out to CSW. Report and DC summary and FL2 provided to facility. Will DC via [facility] if unable to get daughter on phone regarding discharge and if providing transport.    TOC will continue to follow.        Expected Discharge Plan and Services         Expected Discharge Date: 11/14/23                                     Social Determinants of Health (SDOH) Interventions SDOH Screenings   Tobacco Use: Medium Risk (11/08/2023)    Readmission Risk Interventions     No data to display

## 2023-11-21 ENCOUNTER — Emergency Department (HOSPITAL_COMMUNITY)

## 2023-11-21 ENCOUNTER — Emergency Department (HOSPITAL_COMMUNITY)
Admission: EM | Admit: 2023-11-21 | Discharge: 2023-11-22 | Disposition: A | Discharge status: Home / Self Care | Attending: Emergency Medicine | Admitting: Emergency Medicine

## 2023-11-21 ENCOUNTER — Encounter (HOSPITAL_COMMUNITY): Payer: Self-pay

## 2023-11-21 ENCOUNTER — Other Ambulatory Visit: Payer: Self-pay

## 2023-11-21 DIAGNOSIS — S0990XA Unspecified injury of head, initial encounter: Secondary | ICD-10-CM

## 2023-11-21 DIAGNOSIS — W050XXA Fall from non-moving wheelchair, initial encounter: Secondary | ICD-10-CM | POA: Diagnosis not present

## 2023-11-21 DIAGNOSIS — E875 Hyperkalemia: Secondary | ICD-10-CM

## 2023-11-21 DIAGNOSIS — S0083XA Contusion of other part of head, initial encounter: Secondary | ICD-10-CM | POA: Insufficient documentation

## 2023-11-21 DIAGNOSIS — I10 Essential (primary) hypertension: Secondary | ICD-10-CM | POA: Insufficient documentation

## 2023-11-21 DIAGNOSIS — Z853 Personal history of malignant neoplasm of breast: Secondary | ICD-10-CM | POA: Insufficient documentation

## 2023-11-21 DIAGNOSIS — Z79899 Other long term (current) drug therapy: Secondary | ICD-10-CM | POA: Insufficient documentation

## 2023-11-21 DIAGNOSIS — I2510 Atherosclerotic heart disease of native coronary artery without angina pectoris: Secondary | ICD-10-CM | POA: Insufficient documentation

## 2023-11-21 DIAGNOSIS — W19XXXA Unspecified fall, initial encounter: Secondary | ICD-10-CM

## 2023-11-21 DIAGNOSIS — Z7982 Long term (current) use of aspirin: Secondary | ICD-10-CM | POA: Insufficient documentation

## 2023-11-21 DIAGNOSIS — E119 Type 2 diabetes mellitus without complications: Secondary | ICD-10-CM | POA: Diagnosis not present

## 2023-11-21 DIAGNOSIS — F0390 Unspecified dementia without behavioral disturbance: Secondary | ICD-10-CM | POA: Diagnosis not present

## 2023-11-21 LAB — CBC WITH DIFFERENTIAL/PLATELET
Abs Immature Granulocytes: 0.02 10*3/uL (ref 0.00–0.07)
Basophils Absolute: 0.1 10*3/uL (ref 0.0–0.1)
Basophils Relative: 1 %
Eosinophils Absolute: 0.1 10*3/uL (ref 0.0–0.5)
Eosinophils Relative: 1 %
HCT: 33 % — ABNORMAL LOW (ref 36.0–46.0)
Hemoglobin: 9.6 g/dL — ABNORMAL LOW (ref 12.0–15.0)
Immature Granulocytes: 0 %
Lymphocytes Relative: 38 %
Lymphs Abs: 2.1 10*3/uL (ref 0.7–4.0)
MCH: 28 pg (ref 26.0–34.0)
MCHC: 29.1 g/dL — ABNORMAL LOW (ref 30.0–36.0)
MCV: 96.2 fL (ref 80.0–100.0)
Monocytes Absolute: 0.8 10*3/uL (ref 0.1–1.0)
Monocytes Relative: 14 %
Neutro Abs: 2.6 10*3/uL (ref 1.7–7.7)
Neutrophils Relative %: 46 %
Platelets: 215 10*3/uL (ref 150–400)
RBC: 3.43 MIL/uL — ABNORMAL LOW (ref 3.87–5.11)
RDW: 12.4 % (ref 11.5–15.5)
WBC: 5.7 10*3/uL (ref 4.0–10.5)
nRBC: 0 % (ref 0.0–0.2)

## 2023-11-21 LAB — BASIC METABOLIC PANEL WITH GFR
Anion gap: 8 (ref 5–15)
BUN: 29 mg/dL — ABNORMAL HIGH (ref 8–23)
CO2: 23 mmol/L (ref 22–32)
Calcium: 9.1 mg/dL (ref 8.9–10.3)
Chloride: 106 mmol/L (ref 98–111)
Creatinine, Ser: 0.91 mg/dL (ref 0.44–1.00)
GFR, Estimated: >60 mL/min (ref >60)
Glucose, Bld: 89 mg/dL (ref 70–99)
Potassium: 5.4 mmol/L — ABNORMAL HIGH (ref 3.5–5.1)
Sodium: 137 mmol/L (ref 135–145)

## 2023-11-21 LAB — URINALYSIS, ROUTINE W REFLEX MICROSCOPIC
Bilirubin Urine: NEGATIVE
Glucose, UA: NEGATIVE mg/dL
Hgb urine dipstick: NEGATIVE
Ketones, ur: NEGATIVE mg/dL
Leukocytes,Ua: NEGATIVE
Nitrite: NEGATIVE
Protein, ur: NEGATIVE mg/dL
Specific Gravity, Urine: 1.013 (ref 1.005–1.030)
pH: 7 (ref 5.0–8.0)

## 2023-11-21 MED ORDER — LORAZEPAM 2 MG/ML IJ SOLN
1.0000 mg | Freq: Once | INTRAMUSCULAR | Status: AC
  Administered 2023-11-21: 1 mg via INTRAMUSCULAR
  Filled 2023-11-21: qty 1

## 2023-11-21 MED ORDER — HALOPERIDOL LACTATE 5 MG/ML IJ SOLN
2.0000 mg | Freq: Four times a day (QID) | INTRAMUSCULAR | Status: DC | PRN
  Administered 2023-11-21: 2 mg via INTRAMUSCULAR
  Filled 2023-11-21: qty 1

## 2023-11-21 NOTE — ED Notes (Signed)
 Pt not able to stay still for CT at this time, PA notified.

## 2023-11-21 NOTE — ED Triage Notes (Addendum)
 EMS reports from [facility], called out for witnessed trip and fall over wheelchair and struck head. No LOC or blood thinners. Pt arrives with hematoma on right head. Pt Hx of dementia at baseline per staff.  BP 160/ HR 64 RR 26 CBG 146

## 2023-11-21 NOTE — Discharge Instructions (Addendum)
 Patient sustained a small hematoma to her right head.  Imaging of her head and neck were negative for bleeding nor fractures.  Her chest x-ray is without pneumonia, effusions.  Pelvis has no fractures.  She is moving all extremities without difficulty.  I do not see any additional injuries on exam  I feel patient would benefit from PCP f/u within next week for potassium recheck. Please make sure to have plenty of oral fluids, water  at home to try and reduce potassium

## 2023-11-21 NOTE — ED Notes (Signed)
 Previous shift RN notified this RN during report that pt was combative during attempt to obtain lab work.

## 2023-11-21 NOTE — ED Provider Notes (Signed)
 [place] EMERGENCY DEPARTMENT AT [facility] Provider Note   CSN: [id number] Arrival date & time: 11/21/23  1628     History  Chief Complaint  Patient presents with   Jennifer Garrett    Jennifer Garrett is a 89 y.o. female with a past medical history of breast neoplasm, HTN, HLD, CAD, T2DM, dementia presents emergency department via EMS from [facility] for evaluation of witnessed fall.  Nursing home staff report that patient tripped over her wheelchair and struck the right aspect of her head.  No LOC nor seizures following.  Was at baseline prior and following fall.   Fall Pertinent negatives include no chest pain, no abdominal pain, no headaches and no shortness of breath.     Home Medications Prior to Admission medications   Medication Sig Start Date End Date Taking? Authorizing Provider  aspirin  81 MG chewable tablet Chew 81 mg by mouth daily.    [provider]  cyanocobalamin  1000 MCG tablet Take 1 tablet (1,000 mcg total) by mouth daily. 11/14/23   Krishnan, Gokul, MD  divalproex  (DEPAKOTE ) 250 MG DR tablet Take 1 tablet (250 mg total) by mouth 2 (two) times daily. 11/13/23   Krishnan, Gokul, MD  donepezil  (ARICEPT ) 10 MG tablet Take 10 mg by mouth daily. 03/14/16   [provider]  LORazepam  (ATIVAN ) 0.5 MG tablet Take 1 tablet (0.5 mg total) by mouth daily as needed (agitation). 11/13/23   Krishnan, Gokul, MD  lovastatin (MEVACOR) 40 MG tablet Take 40 mg by mouth daily. 10/19/23   [provider]  olmesartan (BENICAR) 40 MG tablet Take 40 mg by mouth daily.    [provider]  potassium chloride  SA (KLOR-CON ) 20 MEQ tablet Take 20 mEq by mouth daily.    [provider]      Allergies    Lyrica [pregabalin], Feldene [piroxicam], Orudis [ketoprofen], Vibramycin [doxycycline calcium ], and Zestril [lisinopril]    Review of Systems   Review of Systems  Constitutional:  Negative for chills, fatigue and fever.  Respiratory:  Negative  for cough, chest tightness, shortness of breath and wheezing.   Cardiovascular:  Negative for chest pain and palpitations.  Gastrointestinal:  Negative for abdominal pain, constipation, diarrhea, nausea and vomiting.  Neurological:  Negative for dizziness, seizures, weakness, light-headedness, numbness and headaches.    Physical Exam Updated Vital Signs BP (!) 143/75 (BP Location: Left Arm)   Pulse 91   Temp 97.8 F (36.6 C) (Axillary)   Resp 18   SpO2 93%  Physical Exam Vitals and nursing note reviewed.  Constitutional:      General: She is not in acute distress.    Appearance: Normal appearance. She is not diaphoretic.  HENT:     Head: Normocephalic and atraumatic.     Comments: Hematoma to right cranium No crepitus to facial bones    Right Ear: External ear normal. No hemotympanum.     Left Ear: External ear normal. No hemotympanum.     Nose: Nose normal.     Right Nostril: No epistaxis or septal hematoma.     Left Nostril: No epistaxis or septal hematoma.     Mouth/Throat:     Mouth: Mucous membranes are moist. No injury or lacerations.  Eyes:     General:        Right eye: No discharge.        Left eye: No discharge.     Extraocular Movements: Extraocular movements intact.     Conjunctiva/sclera: Conjunctivae normal.  Pupils: Pupils are equal, round, and reactive to light.     Comments: No subconjunctival hemorrhage, hyphema, tear drop pupil, or fluid leakage bilaterally  Neck:     Vascular: No carotid bruit.  Cardiovascular:     Rate and Rhythm: Normal rate.     Pulses: Normal pulses.          Radial pulses are 2+ on the right side and 2+ on the left side.       Dorsalis pedis pulses are 2+ on the right side and 2+ on the left side.  Pulmonary:     Effort: Pulmonary effort is normal. No respiratory distress.     Breath sounds: Normal breath sounds. No wheezing.     Comments: Speaking in full complete sentences without difficulty Chest:     Chest wall: No  tenderness.  Abdominal:     General: Bowel sounds are normal. There is no distension.     Palpations: Abdomen is soft.     Tenderness: There is no abdominal tenderness. There is no guarding or rebound.  Musculoskeletal:     Cervical back: Full passive range of motion without pain, normal range of motion and neck supple. No deformity, rigidity or bony tenderness. Normal range of motion.     Thoracic back: No deformity or bony tenderness. Normal range of motion.     Lumbar back: No deformity or bony tenderness. Normal range of motion.     Right hip: No bony tenderness or crepitus.     Left hip: No bony tenderness or crepitus.     Right lower leg: No edema.     Left lower leg: No edema.     Comments: Moving all extremities without difficulty.  Motor 5/5 of BUE and BLE.  Sensation unable to be assessed due to dementia No obvious deformity to joints or long bones Pelvis stable with no shortening or rotation of LE bilaterally  Skin:    General: Skin is warm and dry.     Capillary Refill: Capillary refill takes less than 2 seconds.     Coloration: Skin is not jaundiced or pale.  Neurological:     General: No focal deficit present.     Mental Status: She is alert. Mental status is at baseline.     GCS: GCS eye subscore is 4. GCS verbal subscore is 3. GCS motor subscore is 5.     Cranial Nerves: Cranial nerves 2-12 are intact. No cranial nerve deficit.     Sensory: Sensation is intact. No sensory deficit.     Motor: Motor function is intact. No weakness or tremor.     Coordination: Coordination is intact. Coordination normal. Finger-Nose-Finger Test and Heel to [facility] Test normal.     Gait: Gait is intact. Gait normal.     Deep Tendon Reflexes: Reflexes are normal and symmetric. Reflexes normal.     Comments: A&Ox0. Occasionally follows commands. Difficulty with full neuro assessment 2/2 dementia and being uncooperative with instructions     ED Results / Procedures / Treatments   Labs (all  labs ordered are listed, but only abnormal results are displayed) Labs Reviewed  CBC WITH DIFFERENTIAL/PLATELET - Abnormal; Notable for the following components:      Result Value   RBC 3.43 (*)    Hemoglobin 9.6 (*)    HCT 33.0 (*)    MCHC 29.1 (*)    All other components within normal limits  BASIC METABOLIC PANEL WITH GFR - Abnormal; Notable for the following  components:   Potassium 5.4 (*)    BUN 29 (*)    All other components within normal limits  URINALYSIS, ROUTINE W REFLEX MICROSCOPIC - Abnormal; Notable for the following components:   Color, Urine STRAW (*)    All other components within normal limits    EKG None  Radiology CT Head Wo Contrast Result Date: 11/21/2023 CLINICAL DATA:  Fall.  Head trauma, moderate-severe EXAM: CT HEAD WITHOUT CONTRAST TECHNIQUE: Contiguous axial images were obtained from the base of the skull through the vertex without intravenous contrast. RADIATION DOSE REDUCTION: This exam was performed according to the departmental dose-optimization program which includes automated exposure control, adjustment of the mA and/or kV according to patient size and/or use of iterative reconstruction technique. COMPARISON:  11/08/2023 FINDINGS: Brain: There is atrophy and chronic small vessel disease changes. No acute intracranial abnormality. Specifically, no hemorrhage, hydrocephalus, mass lesion, acute infarction, or significant intracranial injury. Vascular: No hyperdense vessel or unexpected calcification. Skull: No acute calvarial abnormality. Sinuses/Orbits: No acute findings Other: Soft tissue swelling in the right frontotemporal region. IMPRESSION: Atrophy, chronic microvascular disease. No acute intracranial abnormality. Electronically Signed   By: Janeece Mechanic M.D.   On: 11/21/2023 23:24   CT Cervical Spine Wo Contrast Result Date: 11/21/2023 CLINICAL DATA:  Neck trauma (Age >= 65y).  Fall. EXAM: CT CERVICAL SPINE WITHOUT CONTRAST TECHNIQUE: Multidetector CT  imaging of the cervical spine was performed without intravenous contrast. Multiplanar CT image reconstructions were also generated. RADIATION DOSE REDUCTION: This exam was performed according to the departmental dose-optimization program which includes automated exposure control, adjustment of the mA and/or kV according to patient size and/or use of iterative reconstruction technique. COMPARISON:  11/07/2023 FINDINGS: Alignment: Normal Skull base and vertebrae: No acute fracture. No primary bone lesion or focal pathologic process. Soft tissues and spinal canal: No prevertebral fluid or swelling. No visible canal hematoma. Disc levels: Disc space narrowing and early spurring most pronounced at C5-6. Moderate bilateral degenerative facet disease. Upper chest: No acute findings. Other: Stable bilateral thyroid nodules. In the setting of significant comorbidities or limited life expectancy, no follow-up recommended (ref: J Am Coll Radiol. 2015 Feb;12(2): 143-50). IMPRESSION: Degenerative changes.  No acute bony abnormality. Electronically Signed   By: Janeece Mechanic M.D.   On: 11/21/2023 23:23   DG Chest Portable 1 View Result Date: 11/21/2023 CLINICAL DATA:  Fall EXAM: PORTABLE CHEST 1 VIEW COMPARISON:  Chest x-ray 11/08/2023 FINDINGS: The heart is enlarged. The lungs are clear. There is no pleural effusion or pneumothorax. No acute chest or CT is there surgical clips in the bilateral axilla. IMPRESSION: Cardiomegaly. No acute cardiopulmonary process. Electronically Signed   By: Tyron Gallon M.D.   On: 11/21/2023 19:15   DG Pelvis Portable Result Date: 11/21/2023 CLINICAL DATA:  Fall EXAM: PORTABLE PELVIS 1-2 VIEWS COMPARISON:  None Available. FINDINGS: There is no evidence of pelvic fracture or diastasis. No pelvic bone lesions are seen. IMPRESSION: Negative. Electronically Signed   By: Janeece Mechanic M.D.   On: 11/21/2023 19:15    Procedures Procedures    Medications Ordered in ED Medications  haloperidol   lactate (HALDOL ) injection 2 mg (2 mg Intramuscular Given 11/21/23 2210)  LORazepam  (ATIVAN ) injection 1 mg (1 mg Intramuscular Given 11/21/23 1834)    ED Course/ Medical Decision Making/ A&P                                 Medical Decision  Making Amount and/or Complexity of Data Reviewed Labs: ordered. Radiology: ordered.  Risk Prescription drug management.   Patient presents to the ED for concern of fall, this involves an extensive number of treatment options, and is a complaint that carries with it a high risk of complications and morbidity.  The differential diagnosis includes fracture, contusion, dislocation, ICH, electrolyte abnormality   Co morbidities that complicate the patient evaluation  Dementia Uncooperative See HPI   Additional history obtained:  Additional history obtained from [facility]   External records from outside source obtained and reviewed including RN note, nursing staff at [facility] Tests:  I Ordered, and personally interpreted labs.  The pertinent results include:   Mild hyperkalemia UA wo infection Hgb 9.6 (baseline 9.3-9.7)   Imaging Studies ordered:  I ordered imaging studies including CT head, cervical spine, chest x-ray, pelvis x-ray I independently visualized and interpreted imaging which showed no acute traumatic injury I agree with the radiologist interpretation    Medicines ordered and prescription drug management:  I ordered medication including ativan , haldol  for agitation  Reevaluation of the patient after these medicines showed that the patient improved I have reviewed the patients home medicines and have made adjustments as needed     Problem List / ED Course:  Fall, initial encounter Head injury 11:47 PM Called nursing staff regarding how fall happened noted in HPI Will obtain CT of head and C-spine as well as chest x-ray and pelvis as patient is demented unable to tell me if anything hurts.  No  obvious ecchymosis, swelling, deformity to long bones or joints.  Moving BUE and BLE without difficulty. Will also obtain labs to ensure no severe derangement CT attempted to obtain patient multiple times but was unable to scan her d/t agitation. Provided haldol  for agitation 11:47 PM Attempted to contact, daughter, to update on patient condition. She did not answer Called nursing staff Carolynne Citron RN to update her on patient condition and mild hyperkalemia. Shared decision making is had regarding sedating her for IVF vs having her K+ rechecked this week with increased oral fluids at facility. Decided to wait Reiterated that patient's baseline is A&Ox0 with agitation Has been mildly hyperkalemic in past 02/2023 and improved following oral fluids   Reevaluation:  After the interventions noted above, I reevaluated the patient and found that they have :stayed the same    Dispostion:  After consideration of the diagnostic results and the patients response to treatment, I feel that the patent would benefit from outpatient management with PCP follow up. Recommendations provided on DC paperwork   Final Clinical Impression(s) / ED Diagnoses Final diagnoses:  Fall, initial encounter  Minor head injury, initial encounter  Hyperkalemia    Rx / DC Orders ED Discharge Orders     None         Royann Cords, PA 11/21/23 2348    Roberts Ching, MD 11/23/23 1743

## 2023-11-22 DIAGNOSIS — S0083XA Contusion of other part of head, initial encounter: Secondary | ICD-10-CM | POA: Diagnosis not present

## 2023-11-22 NOTE — ED Notes (Signed)
[facility] called to arrange transport

## 2023-11-22 NOTE — ED Notes (Signed)
 Report attempted x3 to [facility], unsuccessful. RN also attempted to notify responsible party-daughter, Jakaira Kleinman x2, unsuccessful.

## 2023-11-24 ENCOUNTER — Emergency Department (HOSPITAL_COMMUNITY)
Admission: EM | Admit: 2023-11-24 | Discharge: 2023-11-24 | Disposition: A | Discharge status: Home / Self Care | Attending: Emergency Medicine | Admitting: Emergency Medicine

## 2023-11-24 ENCOUNTER — Other Ambulatory Visit: Payer: Self-pay

## 2023-11-24 ENCOUNTER — Encounter (HOSPITAL_COMMUNITY): Payer: Self-pay | Admitting: Emergency Medicine

## 2023-11-24 ENCOUNTER — Emergency Department (HOSPITAL_COMMUNITY)

## 2023-11-24 DIAGNOSIS — W19XXXA Unspecified fall, initial encounter: Secondary | ICD-10-CM

## 2023-11-24 DIAGNOSIS — S0083XA Contusion of other part of head, initial encounter: Secondary | ICD-10-CM | POA: Diagnosis present

## 2023-11-24 DIAGNOSIS — Z7982 Long term (current) use of aspirin: Secondary | ICD-10-CM | POA: Diagnosis not present

## 2023-11-24 DIAGNOSIS — W010XXA Fall on same level from slipping, tripping and stumbling without subsequent striking against object, initial encounter: Secondary | ICD-10-CM | POA: Diagnosis not present

## 2023-11-24 NOTE — ED Provider Notes (Signed)
 [place] EMERGENCY DEPARTMENT AT [facility] Provider Note   CSN: [id number] Arrival date & time: 11/24/23  0759     History  Chief Complaint  Patient presents with   Jennifer Garrett    Jennifer Garrett is a 89 y.o. female.  89 yo F with a chief complaints of a fall.  Patient had fallen 3 days ago.  Was seen immediately afterwards and had CT imaging that was negative.  The facility was worried because the bruising on her face has objectively looked worse.  Sent here for evaluation.  Reportedly at her baseline.  Patient is unable to provide any history level 5 caveat dementia.   Fall       Home Medications Prior to Admission medications   Medication Sig Start Date End Date Taking? Authorizing Provider  aspirin  81 MG chewable tablet Chew 81 mg by mouth daily.    [provider]  cyanocobalamin  1000 MCG tablet Take 1 tablet (1,000 mcg total) by mouth daily. 11/14/23   Krishnan, Gokul, MD  divalproex  (DEPAKOTE ) 250 MG DR tablet Take 1 tablet (250 mg total) by mouth 2 (two) times daily. 11/13/23   Krishnan, Gokul, MD  donepezil  (ARICEPT ) 10 MG tablet Take 10 mg by mouth daily. 03/14/16   [provider]  LORazepam  (ATIVAN ) 0.5 MG tablet Take 1 tablet (0.5 mg total) by mouth daily as needed (agitation). 11/13/23   Krishnan, Gokul, MD  lovastatin (MEVACOR) 40 MG tablet Take 40 mg by mouth daily. 10/19/23   [provider]  olmesartan (BENICAR) 40 MG tablet Take 40 mg by mouth daily.    [provider]  potassium chloride  SA (KLOR-CON ) 20 MEQ tablet Take 20 mEq by mouth daily.    [provider]      Allergies    Lyrica [pregabalin], Feldene [piroxicam], Orudis [ketoprofen], Vibramycin [doxycycline calcium ], and Zestril [lisinopril]    Review of Systems   Review of Systems  Physical Exam Updated Vital Signs BP (!) 130/53 (BP Location: Right Arm)   Pulse 65   Temp 98.5 F (36.9 C) (Oral)   Resp 18   SpO2 99%  Physical Exam Vitals and  nursing note reviewed.  Constitutional:      General: She is not in acute distress.    Appearance: She is well-developed. She is not diaphoretic.  HENT:     Head: Normocephalic.     Comments: Raccoon eyes.  Right parietal hematoma. Eyes:     Pupils: Pupils are equal, round, and reactive to light.  Cardiovascular:     Rate and Rhythm: Normal rate and regular rhythm.     Heart sounds: No murmur heard.    No friction rub. No gallop.  Pulmonary:     Effort: Pulmonary effort is normal.     Breath sounds: No wheezing or rales.  Abdominal:     General: There is no distension.     Palpations: Abdomen is soft.     Tenderness: There is no abdominal tenderness.  Musculoskeletal:        General: No tenderness.     Cervical back: Normal range of motion and neck supple.  Skin:    General: Skin is warm and dry.  Neurological:     Mental Status: She is alert and oriented to person, place, and time.  Psychiatric:        Behavior: Behavior normal.     ED Results / Procedures / Treatments   Labs (all labs ordered are listed, but only  abnormal results are displayed) Labs Reviewed - No data to display  EKG None  Radiology CT Head Wo Contrast Result Date: 11/24/2023 CLINICAL DATA:  Jennifer Garrett with trauma to the head EXAM: CT HEAD WITHOUT CONTRAST TECHNIQUE: Contiguous axial images were obtained from the base of the skull through the vertex without intravenous contrast. RADIATION DOSE REDUCTION: This exam was performed according to the departmental dose-optimization program which includes automated exposure control, adjustment of the mA and/or kV according to patient size and/or use of iterative reconstruction technique. COMPARISON:  11/21/2023 FINDINGS: Brain: Age related atrophy. Chronic small-vessel ischemic changes of the white matter. No sign of acute infarction, mass lesion, hemorrhage, hydrocephalus or extra-axial collection. Vascular: There is atherosclerotic calcification of the major vessels at  the base of the brain. Skull: Negative Sinuses/Orbits: Clear/normal. Periorbital soft tissue swelling on the right due to the fall. Other: Right scalp hematoma/swelling. IMPRESSION: 1. No acute or traumatic intracranial finding. Atrophy and chronic small-vessel ischemic changes of the white matter. 2. Right scalp hematoma/swelling. Periorbital soft tissue swelling on the right due to the fall. Electronically Signed   By: Bettylou Brunner M.D.   On: 11/24/2023 08:30    Procedures Procedures    Medications Ordered in ED Medications - No data to display  ED Course/ Medical Decision Making/ A&P                                 Medical Decision Making Amount and/or Complexity of Data Reviewed Radiology: ordered.   89 yo F with a chief complaint of a fall. This happened three days ago.  Facility is reporting that the hematomas look larger than they did before and were concerned and sent her for repeat evaluation.  I think this is likely normal evolution of the bruising.  I do not think that she would benefit from repeat imaging, however patient is unable to provide any history and I am not sure that this is the full concern from the facility.  Will repeat head CT here.  CT head independently interpreted by me without obvious intracranial hemorrhage.  Will discharge home.  PCP follow-up.  8:37 AM:  I have discussed the diagnosis/risks/treatment options with the patient.  Evaluation and diagnostic testing in the emergency department does not suggest an emergent condition requiring admission or immediate intervention beyond what has been performed at this time.  They will follow up with PCP. We also discussed returning to the ED immediately if new or worsening sx occur. We discussed the sx which are most concerning (e.g., sudden worsening pain, fever, inability to tolerate by mouth) that necessitate immediate return. Medications administered to the patient during their visit and any new prescriptions  provided to the patient are listed below.  Medications given during this visit Medications - No data to display   The patient appears reasonably screen and/or stabilized for discharge and I doubt any other medical condition or other [facility] requiring further screening, evaluation, or treatment in the ED at this time prior to discharge.          Final Clinical Impression(s) / ED Diagnoses Final diagnoses:  Fall, initial encounter    Rx / DC Orders ED Discharge Orders     None         Albertus Hughs, DO 11/24/23 [phone]

## 2023-11-24 NOTE — Discharge Instructions (Signed)
 Your CT scan does not show any bleeding inside the skull.  Please follow-up with your family doctor in the office.  The bruising should get better over the course of a week or 2.

## 2023-11-24 NOTE — ED Triage Notes (Addendum)
 Pt BIB [facility] from [facility] due to fall 11/21/23.  Pt tripped over wheelchair at facility and fell on right side.  Significant bruising to right side of head and right eye.  Pt seen at [facility] for same. VSS.

## 2023-11-24 NOTE — ED Notes (Signed)
 Patient transported to CT

## 2024-02-19 ENCOUNTER — Emergency Department (HOSPITAL_COMMUNITY)

## 2024-02-19 ENCOUNTER — Emergency Department (HOSPITAL_COMMUNITY)
Admission: EM | Admit: 2024-02-19 | Discharge: 2024-02-20 | Disposition: A | Discharge status: Home / Self Care | Source: Skilled Nursing Facility | Attending: Emergency Medicine | Admitting: Emergency Medicine

## 2024-02-19 ENCOUNTER — Encounter (HOSPITAL_COMMUNITY): Payer: Self-pay

## 2024-02-19 ENCOUNTER — Other Ambulatory Visit: Payer: Self-pay

## 2024-02-19 DIAGNOSIS — I2510 Atherosclerotic heart disease of native coronary artery without angina pectoris: Secondary | ICD-10-CM | POA: Insufficient documentation

## 2024-02-19 DIAGNOSIS — S0990XA Unspecified injury of head, initial encounter: Secondary | ICD-10-CM | POA: Insufficient documentation

## 2024-02-19 DIAGNOSIS — I10 Essential (primary) hypertension: Secondary | ICD-10-CM | POA: Insufficient documentation

## 2024-02-19 DIAGNOSIS — Z853 Personal history of malignant neoplasm of breast: Secondary | ICD-10-CM | POA: Insufficient documentation

## 2024-02-19 DIAGNOSIS — S0181XA Laceration without foreign body of other part of head, initial encounter: Secondary | ICD-10-CM | POA: Insufficient documentation

## 2024-02-19 DIAGNOSIS — Z23 Encounter for immunization: Secondary | ICD-10-CM | POA: Insufficient documentation

## 2024-02-19 DIAGNOSIS — R4182 Altered mental status, unspecified: Secondary | ICD-10-CM | POA: Diagnosis not present

## 2024-02-19 DIAGNOSIS — W19XXXA Unspecified fall, initial encounter: Secondary | ICD-10-CM | POA: Insufficient documentation

## 2024-02-19 DIAGNOSIS — Z7982 Long term (current) use of aspirin: Secondary | ICD-10-CM | POA: Insufficient documentation

## 2024-02-19 DIAGNOSIS — E860 Dehydration: Secondary | ICD-10-CM | POA: Diagnosis not present

## 2024-02-19 LAB — CBG MONITORING, ED
Glucose-Capillary: 58 mg/dL — ABNORMAL LOW (ref 70–99)
Glucose-Capillary: 80 mg/dL (ref 70–99)
Glucose-Capillary: 91 mg/dL (ref 70–99)

## 2024-02-19 MED ORDER — OLANZAPINE 10 MG IM SOLR
5.0000 mg | Freq: Once | INTRAMUSCULAR | Status: AC
  Administered 2024-02-19: 5 mg via INTRAMUSCULAR
  Filled 2024-02-19 (×2): qty 10

## 2024-02-19 MED ORDER — LORAZEPAM 2 MG/ML IJ SOLN: 1.0000 mg | Freq: Once | INTRAMUSCULAR | Status: DC

## 2024-02-19 MED ORDER — LIDOCAINE-EPINEPHRINE (PF) 2 %-1:200000 IJ SOLN
10.0000 mL | Freq: Once | INTRAMUSCULAR | Status: AC
  Administered 2024-02-19: 10 mL
  Filled 2024-02-19: qty 20

## 2024-02-19 MED ORDER — TETANUS-DIPHTH-ACELL PERTUSSIS 5-2.5-18.5 LF-MCG/0.5 IM SUSY
0.5000 mL | PREFILLED_SYRINGE | Freq: Once | INTRAMUSCULAR | Status: AC
  Administered 2024-02-19: 0.5 mL via INTRAMUSCULAR
  Filled 2024-02-19: qty 0.5

## 2024-02-19 MED ORDER — LORAZEPAM 2 MG/ML IJ SOLN
1.0000 mg | Freq: Once | INTRAMUSCULAR | Status: AC
  Administered 2024-02-19: 1 mg via INTRAMUSCULAR
  Filled 2024-02-19: qty 1

## 2024-02-19 NOTE — Discharge Instructions (Addendum)
 Sutures will need to be removed in 10 days.  Please keep area clean dry and covered.  You can apply Neosporin or bacitracin to the wound twice a day.  Your tetanus shot was updated here.

## 2024-02-19 NOTE — ED Notes (Signed)
 Patient given pb + applesauce & juice to aid in regulating blood sugar

## 2024-02-19 NOTE — ED Notes (Signed)
 Pt completely disoriented. Standing naked between rales of stretcher in incontinent brief. Able to redirect patient to be placed back on stretcher on stretcher after many attempts from multiple staff members. Incontinent brief changed. No pericare was able to be delivered. Patient replaced in gown, but continues to pick and pull at anything attached to her
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
## 2024-02-19 NOTE — ED Triage Notes (Signed)
 Patient arrived via EMS from [facility]. Reports that patient has baseline dementia. Patient fell at approx 1200 resulting in laceration to forehead. 0 LOC. No anticoagulant use

## 2024-02-19 NOTE — ED Notes (Signed)
 1:1 sitter in room with patient. Patient resting on stretcher. Redirects when she picks at stitches. Appears more calm, but remains restless and disoriented

## 2024-02-19 NOTE — ED Provider Notes (Signed)
  Physical Exam  BP 135/73 (BP Location: Left Arm)   Pulse 60   Temp 98 F (36.7 C) (Oral)   Resp 20   Ht 5' (1.524 m)   Wt 50.3 kg   SpO2 98%   BMI 21.68 kg/m   Physical Exam  Procedures  Procedures  ED Course / MDM    Medical Decision Making Amount and/or Complexity of Data Reviewed Radiology: ordered.  Risk Prescription drug management.   Witnessed fall, not anticoagulated Lac to forehead Pending CT head/neck/maxillo From facility  Anticipate discharge when imaging complete **Recheck CBG  16:30 - patient is agitated, restless, no directable. Zyprexa  IM ordered. CT's unable to be obtained at this point. Repeat CBG ordered.   CBG improved 91  CT head, neck, maxillo per radiology: IMPRESSION: 1. No acute intracranial abnormality. 2. Right frontal scalp hematoma. 3. Heterogeneous and enlarged thyroid gland.     Odell Balls, PA-C 02/19/24 1921    Tegeler, Lonni PARAS, MD 02/20/24 1430

## 2024-02-20 ENCOUNTER — Other Ambulatory Visit: Payer: Self-pay

## 2024-02-20 ENCOUNTER — Encounter (HOSPITAL_COMMUNITY): Payer: Self-pay | Admitting: Internal Medicine

## 2024-02-20 ENCOUNTER — Inpatient Hospital Stay (HOSPITAL_COMMUNITY)
Admission: EM | Admit: 2024-02-20 | Discharge: 2024-02-28 | DRG: 640 | Disposition: A | Discharge status: Skilled Nursing Facility | Source: Skilled Nursing Facility | Attending: Student | Admitting: Student

## 2024-02-20 ENCOUNTER — Emergency Department (HOSPITAL_COMMUNITY)

## 2024-02-20 DIAGNOSIS — Z8711 Personal history of peptic ulcer disease: Secondary | ICD-10-CM

## 2024-02-20 DIAGNOSIS — Z9861 Coronary angioplasty status: Secondary | ICD-10-CM

## 2024-02-20 DIAGNOSIS — I129 Hypertensive chronic kidney disease with stage 1 through stage 4 chronic kidney disease, or unspecified chronic kidney disease: Secondary | ICD-10-CM | POA: Diagnosis present

## 2024-02-20 DIAGNOSIS — Z9011 Acquired absence of right breast and nipple: Secondary | ICD-10-CM

## 2024-02-20 DIAGNOSIS — Z881 Allergy status to other antibiotic agents status: Secondary | ICD-10-CM

## 2024-02-20 DIAGNOSIS — Z66 Do not resuscitate: Secondary | ICD-10-CM | POA: Diagnosis present

## 2024-02-20 DIAGNOSIS — S0101XA Laceration without foreign body of scalp, initial encounter: Secondary | ICD-10-CM | POA: Diagnosis present

## 2024-02-20 DIAGNOSIS — F0150 Vascular dementia without behavioral disturbance: Secondary | ICD-10-CM | POA: Diagnosis present

## 2024-02-20 DIAGNOSIS — D631 Anemia in chronic kidney disease: Secondary | ICD-10-CM | POA: Diagnosis present

## 2024-02-20 DIAGNOSIS — N1831 Chronic kidney disease, stage 3a: Secondary | ICD-10-CM | POA: Diagnosis present

## 2024-02-20 DIAGNOSIS — I2510 Atherosclerotic heart disease of native coronary artery without angina pectoris: Secondary | ICD-10-CM | POA: Diagnosis present

## 2024-02-20 DIAGNOSIS — Z853 Personal history of malignant neoplasm of breast: Secondary | ICD-10-CM

## 2024-02-20 DIAGNOSIS — Z888 Allergy status to other drugs, medicaments and biological substances status: Secondary | ICD-10-CM

## 2024-02-20 DIAGNOSIS — S0181XA Laceration without foreign body of other part of head, initial encounter: Secondary | ICD-10-CM | POA: Diagnosis present

## 2024-02-20 DIAGNOSIS — J45909 Unspecified asthma, uncomplicated: Secondary | ICD-10-CM | POA: Diagnosis present

## 2024-02-20 DIAGNOSIS — R4182 Altered mental status, unspecified: Secondary | ICD-10-CM | POA: Diagnosis present

## 2024-02-20 DIAGNOSIS — Z90710 Acquired absence of both cervix and uterus: Secondary | ICD-10-CM

## 2024-02-20 DIAGNOSIS — Z87891 Personal history of nicotine dependence: Secondary | ICD-10-CM

## 2024-02-20 DIAGNOSIS — G9341 Metabolic encephalopathy: Secondary | ICD-10-CM | POA: Diagnosis present

## 2024-02-20 DIAGNOSIS — Z8249 Family history of ischemic heart disease and other diseases of the circulatory system: Secondary | ICD-10-CM

## 2024-02-20 DIAGNOSIS — E860 Dehydration: Principal | ICD-10-CM | POA: Diagnosis present

## 2024-02-20 DIAGNOSIS — E1122 Type 2 diabetes mellitus with diabetic chronic kidney disease: Secondary | ICD-10-CM | POA: Diagnosis present

## 2024-02-20 DIAGNOSIS — F01518 Vascular dementia, unspecified severity, with other behavioral disturbance: Secondary | ICD-10-CM | POA: Diagnosis present

## 2024-02-20 DIAGNOSIS — N390 Urinary tract infection, site not specified: Principal | ICD-10-CM | POA: Diagnosis present

## 2024-02-20 DIAGNOSIS — Z7982 Long term (current) use of aspirin: Secondary | ICD-10-CM

## 2024-02-20 DIAGNOSIS — W19XXXA Unspecified fall, initial encounter: Secondary | ICD-10-CM | POA: Diagnosis present

## 2024-02-20 DIAGNOSIS — D649 Anemia, unspecified: Secondary | ICD-10-CM | POA: Diagnosis present

## 2024-02-20 DIAGNOSIS — Z23 Encounter for immunization: Secondary | ICD-10-CM

## 2024-02-20 DIAGNOSIS — S060XAA Concussion with loss of consciousness status unknown, initial encounter: Secondary | ICD-10-CM | POA: Diagnosis present

## 2024-02-20 DIAGNOSIS — E785 Hyperlipidemia, unspecified: Secondary | ICD-10-CM | POA: Diagnosis present

## 2024-02-20 DIAGNOSIS — Z79899 Other long term (current) drug therapy: Secondary | ICD-10-CM

## 2024-02-20 LAB — URINALYSIS, ROUTINE W REFLEX MICROSCOPIC
Bacteria, UA: NONE SEEN
Bilirubin Urine: NEGATIVE
Glucose, UA: NEGATIVE mg/dL
Hgb urine dipstick: NEGATIVE
Ketones, ur: NEGATIVE mg/dL
Nitrite: NEGATIVE
Protein, ur: NEGATIVE mg/dL
Specific Gravity, Urine: 1.013 (ref 1.005–1.030)
pH: 7 (ref 5.0–8.0)

## 2024-02-20 LAB — COMPREHENSIVE METABOLIC PANEL WITH GFR
ALT: 11 U/L (ref 0–44)
AST: 28 U/L (ref 15–41)
Albumin: 3.4 g/dL — ABNORMAL LOW (ref 3.5–5.0)
Alkaline Phosphatase: 55 U/L (ref 38–126)
Anion gap: 5 (ref 5–15)
BUN: 24 mg/dL — ABNORMAL HIGH (ref 8–23)
CO2: 23 mmol/L (ref 22–32)
Calcium: 9 mg/dL (ref 8.9–10.3)
Chloride: 109 mmol/L (ref 98–111)
Creatinine, Ser: 1.31 mg/dL — ABNORMAL HIGH (ref 0.44–1.00)
GFR, Estimated: 39 mL/min — ABNORMAL LOW (ref >60)
Glucose, Bld: 109 mg/dL — ABNORMAL HIGH (ref 70–99)
Potassium: 5 mmol/L (ref 3.5–5.1)
Sodium: 137 mmol/L (ref 135–145)
Total Bilirubin: 0.3 mg/dL (ref 0.0–1.2)
Total Protein: 7.1 g/dL (ref 6.5–8.1)

## 2024-02-20 LAB — CBC
HCT: 29.3 % — ABNORMAL LOW (ref 36.0–46.0)
Hemoglobin: 9.1 g/dL — ABNORMAL LOW (ref 12.0–15.0)
MCH: 28.6 pg (ref 26.0–34.0)
MCHC: 31.1 g/dL (ref 30.0–36.0)
MCV: 92.1 fL (ref 80.0–100.0)
Platelets: 170 K/uL (ref 150–400)
RBC: 3.18 MIL/uL — ABNORMAL LOW (ref 3.87–5.11)
RDW: 13 % (ref 11.5–15.5)
WBC: 6.4 K/uL (ref 4.0–10.5)
nRBC: 0 % (ref 0.0–0.2)

## 2024-02-20 LAB — TSH: TSH: 1.277 u[IU]/mL (ref 0.350–4.500)

## 2024-02-20 LAB — AMMONIA: Ammonia: 23 umol/L (ref 9–35)

## 2024-02-20 LAB — CBG MONITORING, ED: Glucose-Capillary: 113 mg/dL — ABNORMAL HIGH (ref 70–99)

## 2024-02-20 MED ORDER — OLANZAPINE 10 MG IM SOLR
5.0000 mg | Freq: Once | INTRAMUSCULAR | Status: AC
  Administered 2024-02-20: 5 mg via INTRAMUSCULAR
  Filled 2024-02-20 (×2): qty 10

## 2024-02-20 MED ORDER — HEPARIN SODIUM (PORCINE) 5000 UNIT/ML IJ SOLN
5000.0000 [IU] | Freq: Three times a day (TID) | INTRAMUSCULAR | Status: DC
  Administered 2024-02-20 – 2024-02-28 (×25): 5000 [IU] via SUBCUTANEOUS
  Filled 2024-02-20 (×21): qty 1

## 2024-02-20 MED ORDER — ONDANSETRON HCL 4 MG PO TABS: 4.0000 mg | ORAL_TABLET | Freq: Four times a day (QID) | ORAL | Status: DC | PRN

## 2024-02-20 MED ORDER — MIRTAZAPINE 15 MG PO TABS: 7.5000 mg | ORAL_TABLET | Freq: Every day | ORAL | Status: DC

## 2024-02-20 MED ORDER — ASPIRIN 81 MG PO CHEW
81.0000 mg | CHEWABLE_TABLET | Freq: Every day | ORAL | Status: DC
  Administered 2024-02-22 – 2024-02-28 (×9): 81 mg via ORAL
  Filled 2024-02-20 (×8): qty 1

## 2024-02-20 MED ORDER — ONDANSETRON HCL 4 MG/2ML IJ SOLN: 4.0000 mg | Freq: Four times a day (QID) | INTRAMUSCULAR | Status: DC | PRN

## 2024-02-20 MED ORDER — LACTATED RINGERS IV BOLUS: 500.0000 mL | Freq: Once | INTRAVENOUS | Status: DC

## 2024-02-20 MED ORDER — SENNOSIDES-DOCUSATE SODIUM 8.6-50 MG PO TABS: 1.0000 | ORAL_TABLET | Freq: Every evening | ORAL | Status: DC | PRN

## 2024-02-20 MED ORDER — DONEPEZIL HCL 10 MG PO TABS
10.0000 mg | ORAL_TABLET | Freq: Every day | ORAL | Status: DC
  Administered 2024-02-22 – 2024-02-28 (×9): 10 mg via ORAL
  Filled 2024-02-20 (×8): qty 1

## 2024-02-20 MED ORDER — LORAZEPAM 0.5 MG PO TABS: 0.5000 mg | ORAL_TABLET | Freq: Every day | ORAL | Status: DC | PRN

## 2024-02-20 MED ORDER — PRAVASTATIN SODIUM 40 MG PO TABS
40.0000 mg | ORAL_TABLET | Freq: Every day | ORAL | Status: DC
  Administered 2024-02-21 – 2024-02-27 (×8): 40 mg via ORAL
  Filled 2024-02-20 (×7): qty 1

## 2024-02-20 MED ORDER — SODIUM CHLORIDE 0.9 % IV SOLN
1.0000 g | Freq: Once | INTRAVENOUS | Status: AC
  Administered 2024-02-21: 1 g via INTRAVENOUS
  Filled 2024-02-20: qty 10

## 2024-02-20 MED ORDER — ACETAMINOPHEN 325 MG PO TABS
650.0000 mg | ORAL_TABLET | Freq: Four times a day (QID) | ORAL | Status: DC | PRN
  Administered 2024-02-25: 650 mg via ORAL
  Filled 2024-02-20: qty 2

## 2024-02-20 MED ORDER — ACETAMINOPHEN 650 MG RE SUPP: 650.0000 mg | Freq: Four times a day (QID) | RECTAL | Status: DC | PRN

## 2024-02-20 MED ORDER — DIVALPROEX SODIUM 250 MG PO DR TAB
250.0000 mg | DELAYED_RELEASE_TABLET | Freq: Two times a day (BID) | ORAL | Status: DC
  Administered 2024-02-21 – 2024-02-28 (×16): 250 mg via ORAL
  Filled 2024-02-20 (×15): qty 1

## 2024-02-20 NOTE — ED Notes (Signed)
 Daughter Chariah Bailey [phone] would like an update asap and to please call if you have any questions since her mother has dementia

## 2024-02-20 NOTE — H&P (Signed)
 History and Physical    Jennifer Garrett FMW:[id number] DOB: August 20, 1931 DOA: 02/20/2024  PCP: Shelda Atlas, MD  [facility] from[facility]  I have personally briefly reviewed patient's old medical records in [facility]  Chief Complaint: Altered mental status  HPI: Jennifer Garrett is a 89 y.o. female with medical history significant for advanced vascular dementia, CAD s/p PCI, CKD stage IIIa, stage IV breast cancer in remission, chronic normocytic anemia, HLD who presented to the ED for evaluation of altered mental status.  Patient unable to provide history due to advanced dementia and is otherwise supplemented by EDP, chart review, and daughter by phone.  Patient has advanced vascular dementia at baseline.  She resides at [facility] memory care unit.  She was seen in the ED yesterday 8/3 after a fall in which she hit her head.  This resulted in her right frontal scalp laceration.  Imaging was negative for other traumatic injury.  Laceration was repaired with sutures in place.  She is felt to be at her baseline mentation and was discharged back to her memory care facility.  Today the staff noted that she was slumped over this morning, seemed off balance, and was very off in comparison to her baseline.  Her daughter states that patient has been falling a lot.  She says that the floors at her facility are and even which has increased her fall risk.  She has a lot of agitation and combative behavior at baseline.  ED Course  Labs/Imaging on admission: I have personally reviewed following labs and imaging studies.  Initial vitals showed BP 114/65, pulse 67, RR 17, temp 97.4 F, SpO2 98% on room air.  Labs showed WBC 6.4, hemoglobin 9.1, platelets 170, sodium 137, potassium 5.0, bicarb 23, BUN 24, creatinine 1.31, serum glucose 109, LFTs within normal limits, ammonia 23, TSH 1.277.  Urinalysis showed negative nitrates, moderate leukocytes, 0-5 RBCs, 21-50 WBCs, no  bacteria.  Urine culture collected and pending.  CT head without contrast showed stable atrophy and white matter microvascular ischemic changes.  No acute intracranial normality.  Right anterior frontal scalp hematoma extending along the right nasal bridge.  Portable chest x-ray showed low lung volumes without acute abnormality.  Pelvic x-ray negative for pelvic fracture.  Patient was given IV ceftriaxone .  The hospitalist service was consulted for admission.  Review of Systems:  Unable to obtain full review of systems from patient due to advanced dementia.   Past Medical History:  Diagnosis Date   Arthritis    Asthma    rarely uses neb   Breast cancer (HCC) 05/25/2016   right breast   Breast cancer of lower-inner quadrant of right female breast (HCC) 01/22/2016   Carpal tunnel syndrome    Coronary artery disease    DES to OM3 90% stenosis 2003, 90% small RCA stenosis.     Dementia (HCC)    Diabetes mellitus    not taken glipizide  in over a year, checks surgars daily   History of stomach ulcers    Hyperlipemia    Hypertension    Irregular heart beat    Vascular dementia [facility])     Past Surgical History:  Procedure Laterality Date   ABDOMINAL HYSTERECTOMY     CARDIAC CATHETERIZATION  2003   stent-   CARPAL TUNNEL RELEASE  03/29/2012   Procedure: CARPAL TUNNEL RELEASE;  Surgeon: Arley JONELLE Curia, MD;  Location: [facility];  Service: Orthopedics;  Laterality: Left;  EYE SURGERY     catracts   LESION EXCISION WITH COMPLEX REPAIR Right 05/25/2016   Procedure: complex repair of 25cm wound;  Surgeon: Earlis Ranks, MD;  Location: [facility] OR;  Service: Plastics;  Laterality: Right;   MASTECTOMY     right   MASTECTOMY MODIFIED RADICAL Right 05/25/2016   Procedure: RIGHT MASTECTOMY MODIFIED RADICAL;  Surgeon: Elon Pacini, MD;  Location: [facility] OR;  Service: General;  Laterality: Right;   PUNCH BIOPSY OF SKIN Left 04/04/2017   Procedure: PUNCH BIOPSY OF LEFT BREAST SKIN;   Surgeon: Pacini Elon, MD;  Location: [facility];  Service: General;  Laterality: Left;   RADIOACTIVE SEED GUIDED AXILLARY SENTINEL LYMPH NODE Left 04/04/2017   Procedure: RADIOACTIVE SEED TARGETED LEFT AXILLARY LYMPH NODE EXCISION;  Surgeon: Pacini Elon, MD;  Location: [facility];  Service: General;  Laterality: Left;   SHOULDER ARTHROSCOPY     right and left   TRIGGER FINGER RELEASE  03/29/2012   Procedure: RELEASE TRIGGER FINGER/A-1 PULLEY;  Surgeon: Arley JONELLE Curia, MD;  Location: [facility];  Service: Orthopedics;  Laterality: Left;    Social History: Social History   Tobacco Use   Smoking status: Former    Current packs/day: 0.00    Types: Cigarettes    Quit date: 03/24/1978    Years since quitting: 45.9   Smokeless tobacco: Never  Vaping Use   Vaping status: Never Used  Substance Use Topics   Alcohol use: No   Drug use: No   Allergies  Allergen Reactions   Lyrica [Pregabalin] Swelling    Not listed on the MAR   Feldene [Piroxicam] Rash    Not listed on the MAR    Orudis [Ketoprofen] Rash    Not listed on the [facility]    Vibramycin [Doxycycline Calcium ] Hives and Rash    Not listed on the [facility]    Zestril [Lisinopril] Other (See Comments)    Light sensitivity   Not listed on the [facility]     Family History  Problem Relation Age of Onset   Heart disease Mother        No details      Prior to Admission medications   Medication Sig Start Date End Date Taking? Authorizing Provider  aspirin  81 MG chewable tablet Chew 81 mg by mouth daily.    [provider]  cyanocobalamin  1000 MCG tablet Take 1 tablet (1,000 mcg total) by mouth daily. 11/14/23   Krishnan, Gokul, MD  divalproex  (DEPAKOTE ) 250 MG DR tablet Take 1 tablet (250 mg total) by mouth 2 (two) times daily. 11/13/23   Krishnan, Gokul, MD  donepezil  (ARICEPT ) 10 MG tablet Take 10 mg by mouth daily. 03/14/16   [provider]  LORazepam  (ATIVAN ) 0.5 MG  tablet Take 1 tablet (0.5 mg total) by mouth daily as needed (agitation). 11/13/23   Krishnan, Gokul, MD  lovastatin (MEVACOR) 40 MG tablet Take 40 mg by mouth daily. 10/19/23   [provider]  olmesartan (BENICAR) 40 MG tablet Take 40 mg by mouth daily.    [provider]  potassium chloride  SA (KLOR-CON ) 20 MEQ tablet Take 20 mEq by mouth daily.    [provider]    Physical Exam: Vitals:   02/20/24 1436 02/20/24 1437  BP:  114/65  Pulse: 86 67  Resp:  17  Temp:  (!) 97.4 F (36.3 C)  TempSrc:  Axillary  SpO2: 100% 98%   Exam limited due to cooperation/dementia Constitutional:  Chronically ill-appearing elderly woman resting in bed, restless Eyes: EOMI, lids and conjunctivae normal ENMT: Mucous membranes are moist.  Neck: normal, supple, no masses. Respiratory: clear to auscultation anteriorly.  Normal respiratory effort. No accessory muscle use.  Cardiovascular: Regular rate and rhythm, no murmurs / rubs / gallops. No extremity edema. 2+ pedal pulses. Abdomen: no tenderness Musculoskeletal: no clubbing / cyanosis. No joint deformity upper and lower extremities.  Skin: S/p right frontal scalp laceration repair with sutures in place Neurologic: Sensation intact. Strength equal bilaterally.  Not follow commands Psychiatric: Awake, alert, oriented to self only.    EKG: Not performed.  Assessment/Plan Principal Problem:   Altered mental status Active Problems:   Dyslipidemia   Coronary artery disease involving native coronary artery of native heart without angina pectoris   Vascular dementia (HCC)   Normocytic anemia   Jennifer Garrett is a 89 y.o. female with medical history significant for advanced vascular dementia, CAD s/p PCI, CKD stage IIIa, stage IV breast cancer in remission, chronic normocytic anemia, HLD who is admitted for altered mental status.  Assessment and Plan: Altered mental status on background of vascular dementia: New change from  baseline noticed at facility.  Seen in the ED yesterday after she hit her head.  Imaging was negative.  Repeat CT head today is again negative for acute pathology.  UA with moderate leukocytes and 21-50 WBCs otherwise not strongly convincing for active UTI.  She was given ceftriaxone  empirically in the ED.  Without other obvious etiology, suspect that change in mentation is actually concussive symptoms. - Following delirium precautions - PT/OT eval - Hold further antibiotics, follow urine culture - Continue home donepezil , Remeron , Depakote , Ativan  prn  Fall, prior to arrival: Patient seen in the ED after a fall at her facility on 8/3.  She suffered a right frontal scalp laceration which was repaired in the ED.  Imaging was negative for traumatic injury.  Repeat CT head today negative for acute abnormality.  She will need follow-up for suture removal.  Continue fall precautions, PT/OT eval.  CKD stage IIIa: Creatinine 1.31, slightly increased from recent baseline.  Will give 500 cc fluid bolus and repeat labs in AM.  Hypertension: Holding olmesartan for now.  Coronary artery disease: Continue aspirin  and statin.  Chronic normocytic anemia: Chronic and stable.  Hyperlipidemia: Continue statin.   DVT prophylaxis: heparin  injection 5,000 Units Start: 02/20/24 2200 Code Status:   Code Status: Do not attempt resuscitation (DNR) PRE-ARREST INTERVENTIONS DESIRED discussed with patient's daughter on admission. Family Communication: Daughter by phone Disposition Plan: From memory care and likely return to same facility pending clinical progress Consults called: None Severity of Illness: The appropriate patient status for this patient is OBSERVATION. Observation status is judged to be reasonable and necessary in order to provide the required intensity of service to ensure the patient's safety. The patient's presenting symptoms, physical exam findings, and initial radiographic and laboratory data  in the context of their medical condition is felt to place them at decreased risk for further clinical deterioration. Furthermore, it is anticipated that the patient will be medically stable for discharge from the hospital within 2 midnights of admission.   Jorie Blanch MD [facility]  If 7PM-7AM, please contact night-coverage [email or web address]  02/20/2024, 9:41 PM

## 2024-02-20 NOTE — ED Notes (Signed)
 Phlebotomy to stick

## 2024-02-20 NOTE — ED Provider Notes (Cosign Needed Addendum)
 [facility] AT [facility] Provider Note   CSN: [id number] Arrival date & time: 02/20/24  1408     Patient presents with: No chief complaint on file.   Jennifer Garrett is a 89 y.o. female.   89 year old female presenting with altered mental status.  Patient was seen yesterday in the emergency department after a fall, she had scans of her head/face/C-spine done which were negative for acute process.  I called and spoke with an RN at her facility at [facility], they state that she was very slumped over this morning and seemed off balance, she does have dementia at baseline however she was very off in comparison to her baseline, the physician onsite was concerned that no labs/urine were collected yesterday and that an underlying infectious process may be contributing to her change from baseline today.  Patient with dementia and is combative, she has difficulty answering my questions but states that she fell yesterday and that her head/hips hurt, no other complaints.        Prior to Admission medications   Medication Sig Start Date End Date Taking? Authorizing Provider  aspirin  81 MG chewable tablet Chew 81 mg by mouth daily.    [provider]  cyanocobalamin  1000 MCG tablet Take 1 tablet (1,000 mcg total) by mouth daily. 11/14/23   Krishnan, Gokul, MD  divalproex  (DEPAKOTE ) 250 MG DR tablet Take 1 tablet (250 mg total) by mouth 2 (two) times daily. 11/13/23   Krishnan, Gokul, MD  donepezil  (ARICEPT ) 10 MG tablet Take 10 mg by mouth daily. 03/14/16   [provider]  LORazepam  (ATIVAN ) 0.5 MG tablet Take 1 tablet (0.5 mg total) by mouth daily as needed (agitation). 11/13/23   Krishnan, Gokul, MD  lovastatin (MEVACOR) 40 MG tablet Take 40 mg by mouth daily. 10/19/23   [provider]  olmesartan (BENICAR) 40 MG tablet Take 40 mg by mouth daily.    [provider]  potassium chloride  SA (KLOR-CON ) 20 MEQ tablet Take 20 mEq by  mouth daily.    [provider]    Allergies: Lyrica [pregabalin], Feldene [piroxicam], Orudis [ketoprofen], Vibramycin [doxycycline calcium ], and Zestril [lisinopril]    Review of Systems  Updated Vital Signs  Vitals:   02/20/24 1436 02/20/24 1437  BP:  114/65  Pulse: 86 67  Resp:  17  Temp:  (!) 97.4 F (36.3 C)  TempSrc:  Axillary  SpO2: 100% 98%     Physical Exam Vitals and nursing note reviewed.  HENT:     Head:     Comments: Sutures to right forehead Swelling to right upper eyelid Eyes:     Extraocular Movements: Extraocular movements intact.  Cardiovascular:     Rate and Rhythm: Normal rate.  Pulmonary:     Effort: Pulmonary effort is normal.  Abdominal:     Palpations: Abdomen is soft.  Musculoskeletal:     Cervical back: Normal range of motion. No rigidity or tenderness.     Comments: Moves all extremities spontaneously without difficulty No TTP of bilateral LE's/UE's  Skin:    General: Skin is warm and dry.  Neurological:     Comments: Dementia at baseline, alert and combative, does not answer orientation questions     (all labs ordered are listed, but only abnormal results are displayed) Labs Reviewed  COMPREHENSIVE METABOLIC PANEL WITH GFR - Abnormal; Notable for the following components:      Result Value   Glucose, Bld 109 (*)  BUN 24 (*)    Creatinine, Ser 1.31 (*)    Albumin 3.4 (*)    GFR, Estimated 39 (*)    All other components within normal limits  CBC - Abnormal; Notable for the following components:   RBC 3.18 (*)    Hemoglobin 9.1 (*)    HCT 29.3 (*)    All other components within normal limits  URINALYSIS, ROUTINE W REFLEX MICROSCOPIC - Abnormal; Notable for the following components:   APPearance HAZY (*)    Leukocytes,Ua MODERATE (*)    All other components within normal limits  CBG MONITORING, ED - Abnormal; Notable for the following components:   Glucose-Capillary 113 (*)    All other components within normal  limits  URINE CULTURE  TSH  AMMONIA    EKG: None  Radiology: CT Head Wo Contrast Result Date: 02/19/2024 EXAM: CT HEAD, FACIAL BONES AND CERVICAL SPINE WITHOUT CONTRAST 02/19/2024 06:57:38 PM TECHNIQUE: CT of the head, facial bones and cervical spine was performed without the administration of intravenous contrast. Multiplanar reformatted images are provided for review. Automated exposure control, iterative reconstruction, and/or weight based adjustment of the mA/kV was utilized to reduce the radiation dose to as low as reasonably achievable. COMPARISON: 11/24/2023 CLINICAL HISTORY: Head trauma, minor (Age >= 65y). Pt VERY COMBATIVE did not hold still FINDINGS: CT HEAD BRAIN AND VENTRICLES: No acute intracranial hemorrhage. No mass effect or midline shift. No extra-axial fluid collection. Gray-white differentiation is maintained. No hydrocephalus. Moderate volume loss and chronic ischemic white matter changes. SKULL AND SCALP: No acute skull fracture. Right frontal scalp hematoma. CT FACIAL BONES FACIAL BONES: No acute facial fracture. No mandibular dislocation. No suspicious bone lesion. ORBITS: No acute traumatic injury. SINUSES AND MASTOIDS: No acute abnormality. SOFT TISSUES: Heterogeneous and enlarged thyroid gland. CT CERVICAL SPINE BONES AND ALIGNMENT: No acute fracture or traumatic malalignment. DEGENERATIVE CHANGES: No significant degenerative changes. SOFT TISSUES: No prevertebral soft tissue swelling. IMPRESSION: 1. No acute intracranial abnormality. 2. Right frontal scalp hematoma. 3. Heterogeneous and enlarged thyroid gland. Electronically signed by: Franky Stanford MD 02/19/2024 07:08 PM EDT RP Workstation: [id number]   CT Cervical Spine Wo Contrast Result Date: 02/19/2024 EXAM: CT HEAD, FACIAL BONES AND CERVICAL SPINE WITHOUT CONTRAST 02/19/2024 06:57:38 PM TECHNIQUE: CT of the head, facial bones and cervical spine was performed without the administration of intravenous contrast. Multiplanar  reformatted images are provided for review. Automated exposure control, iterative reconstruction, and/or weight based adjustment of the mA/kV was utilized to reduce the radiation dose to as low as reasonably achievable. COMPARISON: 11/24/2023 CLINICAL HISTORY: Head trauma, minor (Age >= 65y). Pt VERY COMBATIVE did not hold still FINDINGS: CT HEAD BRAIN AND VENTRICLES: No acute intracranial hemorrhage. No mass effect or midline shift. No extra-axial fluid collection. Gray-white differentiation is maintained. No hydrocephalus. Moderate volume loss and chronic ischemic white matter changes. SKULL AND SCALP: No acute skull fracture. Right frontal scalp hematoma. CT FACIAL BONES FACIAL BONES: No acute facial fracture. No mandibular dislocation. No suspicious bone lesion. ORBITS: No acute traumatic injury. SINUSES AND MASTOIDS: No acute abnormality. SOFT TISSUES: Heterogeneous and enlarged thyroid gland. CT CERVICAL SPINE BONES AND ALIGNMENT: No acute fracture or traumatic malalignment. DEGENERATIVE CHANGES: No significant degenerative changes. SOFT TISSUES: No prevertebral soft tissue swelling. IMPRESSION: 1. No acute intracranial abnormality. 2. Right frontal scalp hematoma. 3. Heterogeneous and enlarged thyroid gland. Electronically signed by: Franky Stanford MD 02/19/2024 07:08 PM EDT RP Workstation: [id number]   CT Maxillofacial Wo Contrast Result Date: 02/19/2024 EXAM: CT HEAD,  FACIAL BONES AND CERVICAL SPINE WITHOUT CONTRAST 02/19/2024 06:57:38 PM TECHNIQUE: CT of the head, facial bones and cervical spine was performed without the administration of intravenous contrast. Multiplanar reformatted images are provided for review. Automated exposure control, iterative reconstruction, and/or weight based adjustment of the mA/kV was utilized to reduce the radiation dose to as low as reasonably achievable. COMPARISON: 11/24/2023 CLINICAL HISTORY: Head trauma, minor (Age >= 65y). Pt VERY COMBATIVE did not hold still FINDINGS: CT  HEAD BRAIN AND VENTRICLES: No acute intracranial hemorrhage. No mass effect or midline shift. No extra-axial fluid collection. Gray-white differentiation is maintained. No hydrocephalus. Moderate volume loss and chronic ischemic white matter changes. SKULL AND SCALP: No acute skull fracture. Right frontal scalp hematoma. CT FACIAL BONES FACIAL BONES: No acute facial fracture. No mandibular dislocation. No suspicious bone lesion. ORBITS: No acute traumatic injury. SINUSES AND MASTOIDS: No acute abnormality. SOFT TISSUES: Heterogeneous and enlarged thyroid gland. CT CERVICAL SPINE BONES AND ALIGNMENT: No acute fracture or traumatic malalignment. DEGENERATIVE CHANGES: No significant degenerative changes. SOFT TISSUES: No prevertebral soft tissue swelling. IMPRESSION: 1. No acute intracranial abnormality. 2. Right frontal scalp hematoma. 3. Heterogeneous and enlarged thyroid gland. Electronically signed by: Franky Stanford MD 02/19/2024 07:08 PM EDT RP Workstation: [id number]     Procedures   Medications Ordered in the ED  cefTRIAXone  (ROCEPHIN ) 1 g in sodium chloride  0.9 % 100 mL IVPB (has no administration in time range)                                    Medical Decision Making This patient presents to the ED for concern of AMS, this involves an extensive number of treatment options, and is a complaint that carries with it a high risk of complications and morbidity.  The differential diagnosis includes head injury, UTI, pneumonia, other infectious process, worsening dementia   Co morbidities that complicate the patient evaluation  Dementia, CAD   Additional history obtained:  Additional history obtained from record review External records from outside source obtained and reviewed including ED note from yesterday   Lab Tests:  I Ordered, and personally interpreted labs.  The pertinent results include: CBG 113.  CBC notable for anemia with hemoglobin of 9.1, however this is largely stable as  compared to patient's baseline.  CMP notable for creatinine of 1.31, this is elevated as compared to most recent baseline however it seems that her kidney function ranges from 0.9-1.45.  Ammonia within normal limits.  Urinalysis notable for moderate leukocytes with 21-50 white blood cells, calcium  oxalate crystals.  TSH within normal limits.    Imaging Studies ordered:  I ordered imaging studies including CT head, chest x-ray, pelvic x-ray I independently visualized and interpreted imaging which showed  - CT head:  1. Stable atrophy and white matter microvascular ischemic changes. 2. No acute intracranial abnormality by noncontrast CT. 3. Right anterior frontal scalp hematoma extending along the right nasal bridge. - CXR: Low lung volumes without acute abnormality. - pelvic XR: No pelvic fracture. Bilateral hip osteoarthritis. I agree with the radiologist interpretation   Cardiac Monitoring: / EKG:  The patient was maintained on a cardiac monitor.  I personally viewed and interpreted the cardiac monitored which showed an underlying rhythm of: NSR   Consultations Obtained:  I requested consultation with the hospitalist,  and discussed lab and imaging findings as well as pertinent plan - they recommend: spoke with Dr. Tobie who plans  to come and evaluate the patient but he does feel that she is appropriate for admission for IV antibiotics in the setting of urinary tract infection with worsening delirium   Problem List / ED Course / Critical interventions / Medication management   I ordered medication including IV Rocephin  for UTI I have reviewed the patients home medicines and have made adjustments as needed   Social Determinants of Health:  Former tobacco use   Test / Admission - Considered:  Physical exam largely unremarkable, patient is combative thus making physical exam difficult.  Will proceed with AMS workup given recent fall and change from her baseline according to RN at  [facility]. CT scan/x-ray reassuring.  Workup largely unremarkable as above, urine is positive for leukocytes, will send for culture.  It is likely that urinary tract infection is contributing to patient's worsening delirium, she would likely benefit from admission.  I spoke with the hospitalist service Dr. Tobie as above.  Patient's daughter is at the bedside, she is aware of this plan. IV Rocephin  was ordered in the emergency department this evening, however patient is combative and difficult to obtain IV access.  IM Zyprexa  ordered in attempt to calm the patient down to establish IV access so that her antibiotics can be administered prior to moving her to the floor.    Amount and/or Complexity of Data Reviewed Labs: ordered. Radiology: ordered.  Risk Prescription drug management. Decision regarding hospitalization.        Final diagnoses:  Urinary tract infection without hematuria, site unspecified  Altered mental status, unspecified altered mental status type    ED Discharge Orders     None             Glendia Rocky LOISE DEVONNA 02/20/24 2030    Francesca Elsie CROME, MD 02/21/24 [id number]

## 2024-02-20 NOTE — ED Triage Notes (Signed)
 According to ptar: Pt came in yesterday  to [facility] for a fall and had stitch's placed. Today facility said pt is more altered, she is not acting like her self and  they wanted her to come to the ed due to lethargy.  Vitals: Ems could not get pulse ox to pick up Bp 110/60 Ems unable to get pulse or spo2 Rr 16

## 2024-02-20 NOTE — ED Notes (Signed)
 Pt unable or unwilling to answer questions to answer questions related to triage or orientation.

## 2024-02-20 NOTE — Hospital Course (Signed)
 Jennifer Garrett is a 89 y.o. female with medical history significant for advanced vascular dementia, CAD s/p PCI, CKD stage IIIa, stage IV breast cancer in remission, chronic normocytic anemia, HLD who is admitted for altered mental status.

## 2024-02-21 DIAGNOSIS — S0101XA Laceration without foreign body of scalp, initial encounter: Secondary | ICD-10-CM | POA: Diagnosis present

## 2024-02-21 DIAGNOSIS — Z79899 Other long term (current) drug therapy: Secondary | ICD-10-CM | POA: Diagnosis not present

## 2024-02-21 DIAGNOSIS — Z9011 Acquired absence of right breast and nipple: Secondary | ICD-10-CM | POA: Diagnosis not present

## 2024-02-21 DIAGNOSIS — Z7982 Long term (current) use of aspirin: Secondary | ICD-10-CM | POA: Diagnosis not present

## 2024-02-21 DIAGNOSIS — R4182 Altered mental status, unspecified: Secondary | ICD-10-CM | POA: Diagnosis present

## 2024-02-21 DIAGNOSIS — E1122 Type 2 diabetes mellitus with diabetic chronic kidney disease: Secondary | ICD-10-CM | POA: Diagnosis present

## 2024-02-21 DIAGNOSIS — Z66 Do not resuscitate: Secondary | ICD-10-CM | POA: Diagnosis present

## 2024-02-21 DIAGNOSIS — S0181XA Laceration without foreign body of other part of head, initial encounter: Secondary | ICD-10-CM | POA: Diagnosis present

## 2024-02-21 DIAGNOSIS — E785 Hyperlipidemia, unspecified: Secondary | ICD-10-CM | POA: Diagnosis present

## 2024-02-21 DIAGNOSIS — N390 Urinary tract infection, site not specified: Secondary | ICD-10-CM | POA: Diagnosis present

## 2024-02-21 DIAGNOSIS — I129 Hypertensive chronic kidney disease with stage 1 through stage 4 chronic kidney disease, or unspecified chronic kidney disease: Secondary | ICD-10-CM | POA: Diagnosis present

## 2024-02-21 DIAGNOSIS — Z9861 Coronary angioplasty status: Secondary | ICD-10-CM | POA: Diagnosis not present

## 2024-02-21 DIAGNOSIS — D649 Anemia, unspecified: Secondary | ICD-10-CM | POA: Diagnosis not present

## 2024-02-21 DIAGNOSIS — Z90710 Acquired absence of both cervix and uterus: Secondary | ICD-10-CM | POA: Diagnosis not present

## 2024-02-21 DIAGNOSIS — F01518 Vascular dementia, unspecified severity, with other behavioral disturbance: Secondary | ICD-10-CM

## 2024-02-21 DIAGNOSIS — I2510 Atherosclerotic heart disease of native coronary artery without angina pectoris: Secondary | ICD-10-CM

## 2024-02-21 DIAGNOSIS — Z87891 Personal history of nicotine dependence: Secondary | ICD-10-CM | POA: Diagnosis not present

## 2024-02-21 DIAGNOSIS — S060XAA Concussion with loss of consciousness status unknown, initial encounter: Secondary | ICD-10-CM | POA: Diagnosis present

## 2024-02-21 DIAGNOSIS — Z8249 Family history of ischemic heart disease and other diseases of the circulatory system: Secondary | ICD-10-CM | POA: Diagnosis not present

## 2024-02-21 DIAGNOSIS — E860 Dehydration: Secondary | ICD-10-CM | POA: Diagnosis present

## 2024-02-21 DIAGNOSIS — R400 Somnolence: Secondary | ICD-10-CM

## 2024-02-21 DIAGNOSIS — N1831 Chronic kidney disease, stage 3a: Secondary | ICD-10-CM | POA: Diagnosis present

## 2024-02-21 DIAGNOSIS — J45909 Unspecified asthma, uncomplicated: Secondary | ICD-10-CM | POA: Diagnosis present

## 2024-02-21 DIAGNOSIS — Z853 Personal history of malignant neoplasm of breast: Secondary | ICD-10-CM | POA: Diagnosis not present

## 2024-02-21 DIAGNOSIS — G9341 Metabolic encephalopathy: Secondary | ICD-10-CM | POA: Diagnosis present

## 2024-02-21 DIAGNOSIS — D631 Anemia in chronic kidney disease: Secondary | ICD-10-CM | POA: Diagnosis present

## 2024-02-21 DIAGNOSIS — W19XXXA Unspecified fall, initial encounter: Secondary | ICD-10-CM | POA: Diagnosis present

## 2024-02-21 DIAGNOSIS — Z23 Encounter for immunization: Secondary | ICD-10-CM | POA: Diagnosis present

## 2024-02-21 DIAGNOSIS — Z8711 Personal history of peptic ulcer disease: Secondary | ICD-10-CM | POA: Diagnosis not present

## 2024-02-21 LAB — BLOOD GAS, VENOUS
Acid-Base Excess: 0.8 mmol/L (ref 0.0–2.0)
Bicarbonate: 27.1 mmol/L (ref 20.0–28.0)
Drawn by: 4956
O2 Saturation: 44.8 %
Patient temperature: 36.8
pCO2, Ven: 49 mmHg (ref 44–60)
pH, Ven: 7.35 (ref 7.25–7.43)
pO2, Ven: <31 mmHg — CL (ref 32–45)

## 2024-02-21 LAB — BASIC METABOLIC PANEL WITH GFR
Anion gap: 8 (ref 5–15)
BUN: 22 mg/dL (ref 8–23)
CO2: 22 mmol/L (ref 22–32)
Calcium: 9.1 mg/dL (ref 8.9–10.3)
Chloride: 110 mmol/L (ref 98–111)
Creatinine, Ser: 1.17 mg/dL — ABNORMAL HIGH (ref 0.44–1.00)
GFR, Estimated: 45 mL/min — ABNORMAL LOW (ref >60)
Glucose, Bld: 75 mg/dL (ref 70–99)
Potassium: 4.7 mmol/L (ref 3.5–5.1)
Sodium: 140 mmol/L (ref 135–145)

## 2024-02-21 LAB — CBC
HCT: 28.1 % — ABNORMAL LOW (ref 36.0–46.0)
Hemoglobin: 8.7 g/dL — ABNORMAL LOW (ref 12.0–15.0)
MCH: 28.2 pg (ref 26.0–34.0)
MCHC: 31 g/dL (ref 30.0–36.0)
MCV: 91.2 fL (ref 80.0–100.0)
Platelets: 153 K/uL (ref 150–400)
RBC: 3.08 MIL/uL — ABNORMAL LOW (ref 3.87–5.11)
RDW: 13 % (ref 11.5–15.5)
WBC: 5.8 K/uL (ref 4.0–10.5)
nRBC: 0 % (ref 0.0–0.2)

## 2024-02-21 LAB — RAPID URINE DRUG SCREEN, HOSP PERFORMED
Amphetamines: NOT DETECTED
Barbiturates: NOT DETECTED
Benzodiazepines: NOT DETECTED
Cocaine: NOT DETECTED
Opiates: NOT DETECTED
Tetrahydrocannabinol: NOT DETECTED

## 2024-02-21 LAB — VALPROIC ACID LEVEL: Valproic Acid Lvl: 16 ug/mL — ABNORMAL LOW (ref 50–100)

## 2024-02-21 LAB — VITAMIN B12: Vitamin B-12: 2399 pg/mL — ABNORMAL HIGH (ref 180–914)

## 2024-02-21 MED ORDER — DEXTROSE IN LACTATED RINGERS 5 % IV SOLN: INTRAVENOUS | Status: AC

## 2024-02-21 NOTE — Evaluation (Signed)
 Physical Therapy Evaluation Patient Details Name: Jennifer Garrett MRN: [id number] DOB: January 20, 1932 Today's Date: 02/21/2024  History of Present Illness  Pt is a 89 y/o female presenting on 8/4 from [facility] with AMS. Noted seen in ED on 8/3 after fall with R frontal scalp laceration. CT head negative for acute abnormality.  PMH - HTN, arthritis, breast cancer, irregular heart beat, CAD, disbetes and vascular dementia.  Clinical Impression  Pt admitted with above. Per chart review, pt resides at [facility] memory care. Pt lethargic and not participatory in therapy evaluation. Pt A&O to self only. +2 for transition to edge of bed and to lift hips up from edge of bed to scoot up. Think this is more related to decreased arousal/effort dependent rather than true weakness. Pt swatting at therapist and stating, leave me alone. Recommend return to familiar memory care environment upon d/c. Will continue to follow acutely.       If plan is discharge home, recommend the following: Two people to help with walking and/or transfers;Two people to help with bathing/dressing/bathroom   Can travel by private vehicle   No    Equipment Recommendations None recommended by PT  Recommendations for Other Services       Functional Status Assessment Patient has had a recent decline in their functional status and/or demonstrates limited ability to make significant improvements in function in a reasonable and predictable amount of time     Precautions / Restrictions Precautions Precautions: Fall Restrictions Weight Bearing Restrictions Per Provider Order: No      Mobility  Bed Mobility Overal bed mobility: Needs Assistance Bed Mobility: Supine to Sit, Sit to Supine     Supine to sit: Total assist, +2 for physical assistance Sit to supine: Total assist, +2 for physical assistance        Transfers Overall transfer level: Needs assistance Equipment used: 2 person hand held  assist Transfers: Sit to/from Stand Sit to Stand: Total assist, +2 physical assistance           General transfer comment: TotalA +2 to lift hips up from bed and scoot towards right    Ambulation/Gait               General Gait Details: unable  Stairs            Wheelchair Mobility     Tilt Bed    Modified Rankin (Stroke Patients Only)       Balance Overall balance assessment: Needs assistance Sitting-balance support: Feet supported Sitting balance-Leahy Scale: Poor Sitting balance - Comments: Initially requiring modA, right lateral and posterior lean, but able to progress to CGA with increased time                                     Pertinent Vitals/Pain Pain Assessment Pain Assessment: Faces Faces Pain Scale: No hurt    Home Living Family/patient expects to be discharged to:: Skilled nursing facility                   Additional Comments: LTC at memory care    Prior Function Prior Level of Function : Other (comment)             Mobility Comments: recent falls ADLs Comments: assume assist for ADL's     Extremity/Trunk Assessment   Upper Extremity Assessment Upper Extremity Assessment: Defer to OT evaluation    Lower  Extremity Assessment Lower Extremity Assessment: Generalized weakness    Cervical / Trunk Assessment Cervical / Trunk Assessment: Kyphotic  Communication   Communication Communication: Impaired Factors Affecting Communication: Reduced clarity of speech;Difficulty expressing self    Cognition Arousal: Lethargic Behavior During Therapy: Flat affect   PT - Cognitive impairments: History of cognitive impairments                       PT - Cognition Comments: Advanced dementia. Pt oriented to self. Not following commands, drowsy and difficult to arouse Following commands: Impaired Following commands impaired:  (not following commands)     Cueing Cueing Techniques: Verbal cues,  Tactile cues, Visual cues, Gestural cues     General Comments      Exercises     Assessment/Plan    PT Assessment Patient needs continued PT services  PT Problem List Decreased strength;Decreased activity tolerance;Decreased balance;Decreased mobility;Decreased cognition       PT Treatment Interventions Gait training;DME instruction;Functional mobility training;Therapeutic activities;Therapeutic exercise;Balance training;Patient/family education    PT Goals (Current goals can be found in the Care Plan section)  Acute Rehab PT Goals Patient Stated Goal: unable PT Goal Formulation: Patient unable to participate in goal setting Time For Goal Achievement: 03/06/24 Potential to Achieve Goals: Fair    Frequency Min 2X/week     Co-evaluation PT/OT/SLP Co-Evaluation/Treatment: Yes Reason for Co-Treatment: Necessary to address cognition/behavior during functional activity;For patient/therapist safety;To address functional/ADL transfers PT goals addressed during session: Mobility/safety with mobility         AM-PAC PT 6 Clicks Mobility  Outcome Measure Help needed turning from your back to your side while in a flat bed without using bedrails?: A Lot Help needed moving from lying on your back to sitting on the side of a flat bed without using bedrails?: Total Help needed moving to and from a bed to a chair (including a wheelchair)?: Total Help needed standing up from a chair using your arms (e.g., wheelchair or bedside chair)?: Total Help needed to walk in hospital room?: Total Help needed climbing 3-5 steps with a railing? : Total 6 Click Score: 7    End of Session Equipment Utilized During Treatment: Gait belt Activity Tolerance: Patient tolerated treatment well Patient left: in bed;with call bell/phone within reach;with bed alarm set Nurse Communication: Mobility status PT Visit Diagnosis: Muscle weakness (generalized) (M62.81);History of falling (Z91.81)    Time:  9144-9084 PT Time Calculation (min) (ACUTE ONLY): 20 min   Charges:   PT Evaluation $PT Eval Moderate Complexity: 1 Mod   PT General Charges $$ ACUTE PT VISIT: 1 Visit         Aleck Garrett, PT, DPT Acute Rehabilitation Services Office [phone]   Jennifer Garrett 02/21/2024, 9:33 AM

## 2024-02-21 NOTE — Progress Notes (Signed)
 PROGRESS NOTE  Jennifer Garrett FMW:[id number] DOB: 1931-11-27   PCP: Shelda Atlas, MD  Patient is from: [facility]  DOA: 02/20/2024 LOS: 0  Chief complaints Chief Complaint  Patient presents with   Altered Mental Status     Brief Narrative / Interim history: 89 year old F with PMH of advanced vascular dementia, CAD/PCI, CKD-3A, stage IV breast cancer in remission, HLD and recurrent falls brought to ED by EMS due to altered mental status and slumping over, and admitted with altered mental status.  She was seen in ED prior day after she had a fall and hit her head with right frontal skin laceration that was repaired in ED and discharged back to memory care.  In ED, vitals, basic labs, ammonia, UA and TSH without significant finding.  CT head without acute finding other than right anterior frontal scalp hematoma extending along the right nasal bridge.  CXR with low lung volumes.  Pelvic x-ray without acute finding.  Received IV ceftriaxone .  Urine culture sent, and admitted for further care.    Subjective: Seen and examined earlier this morning.  Very somnolent this morning.  Barely wakes up.  Withdraws to noxious stimuli in all 4 extremities.  Patient received 5 mg Zyprexa  injection about 9 PM last night.   Objective: Vitals:   02/20/24 2152 02/21/24 0040 02/21/24 0434 02/21/24 0742  BP: (!) 111/90 (!) 148/77 (!) 136/48 130/68  Pulse: 67 71 (!) 57 60  Resp: 18 18 18 12   Temp: 98.3 F (36.8 C) 99.3 F (37.4 C) 99.1 F (37.3 C) 98.3 F (36.8 C)  TempSrc: Oral Oral Oral Oral  SpO2: 100% 100% 99% 100%    Examination:  GENERAL: No apparent distress.  Nontoxic. HEENT: MMM.  Vision and hearing grossly intact.  NECK: Supple.  No apparent JVD.  RESP:  No IWOB.  Fair aeration bilaterally. CVS:  RRR. Heart sounds normal.  ABD/GI/GU: BS+. Abd soft, NTND.  MSK/EXT:  Moves extremities. No apparent deformity. No edema.  SKIN: no apparent skin lesion or wound NEURO:  Sleepy and barely wakes to voice.  Withdraws all extremities to noxious stimuli. PSYCH: Calm. Normal affect.   Consultants:  None  Procedures: None  Microbiology summarized: Urine culture pending  Assessment and plan: Acute metabolic encephalopathy with background of vascular dementia: Reportedly new change from baseline noticed at facility.  Seen in the ED yesterday after she hit her head.  Imaging was negative.  Repeat CT head today is again negative for acute pathology.  UA not convincing for UTI.  Basic labs without significant finding.  Ammonia and TSH within normal.  Wonder if she had concussion.  Also on Ativan  and Remeron  at home.  Reportedly awake and alert and oriented to self on admission.  She is currently very somnolent.  She has received IM Zyprexa  5 mg about 9 PM. -Discontinue Ativan  and mirtazapine  -Check Depakote  level, VBG, B12, B1 and RPR.  Low suspicion for seizure. -Reorientation and delirium precaution -IV fluid while NPO. -PT/OT eval   Fall, prior to arrival: Seen in ED on 8/3.  Sustained right frontal scalp laceration and hematoma.  Laceration repaired in ED.  Imaging was negative for traumatic injury.  Repeat CT head today negative for acute abnormality.   - Suture removal in 5 to 7 days from 8/3.   -Continue fall precautions,  -PT/OT eval.  Vascular dementia with behavioral disturbance:  -See acute metabolic encephalopathy -Continue home Depakote  and Aricept  when able to take p.o.  Essential hypertension: BP within acceptable range. -Continue holding olmesartan for now.   Coronary artery disease: -Continue continue aspirin  and statin when able to take p.o.   Chronic normocytic anemia: Stable -Monitor  CKD-3A: Stable  -IV fluid as above   Hyperlipidemia: -Continue continue statin.  Pyuria: Doubt UTI - Follow urine culture  There is no height or weight on file to calculate BMI.           DVT prophylaxis:  heparin  injection 5,000 Units  Start: 02/20/24 2200  Code Status: DNR Family Communication: None at bedside. Level of care: Med-Surg Status is: Observation The patient will require care spanning > 2 midnights and should be moved to inpatient because: Acute metabolic encephalopathy   Final disposition: To be determined   55 minutes with more than 50% spent in reviewing records, counseling patient/family and coordinating care.   Sch Meds:  Scheduled Meds:  aspirin   81 mg Oral Daily   divalproex   250 mg Oral BID   donepezil   10 mg Oral Daily   heparin   5,000 Units Subcutaneous Q8H   pravastatin   40 mg Oral q1800   Continuous Infusions:  dextrose  5% lactated ringers      lactated ringers      PRN Meds:.acetaminophen  **OR** acetaminophen , ondansetron  **OR** ondansetron  (ZOFRAN ) IV, senna-docusate  Antimicrobials: Anti-infectives (From admission, onward)    Start     Dose/Rate Route Frequency Ordered Stop   02/20/24 1815  cefTRIAXone  (ROCEPHIN ) 1 g in sodium chloride  0.9 % 100 mL IVPB        1 g 200 mL/hr over 30 Minutes Intravenous  Once 02/20/24 1801 02/21/24 0114        I have personally reviewed the following labs and images: CBC: Recent Labs  Lab 02/20/24 1438 02/21/24 0453  WBC 6.4 5.8  HGB 9.1* 8.7*  HCT 29.3* 28.1*  MCV 92.1 91.2  PLT 170 153   BMP &GFR Recent Labs  Lab 02/20/24 1438 02/21/24 0453  NA 137 140  K 5.0 4.7  CL 109 110  CO2 23 22  GLUCOSE 109* 75  BUN 24* 22  CREATININE 1.31* 1.17*  CALCIUM  9.0 9.1   Estimated Creatinine Clearance: 23.4 mL/min (A) (by C-G formula based on SCr of 1.17 mg/dL (H)). Liver & Pancreas: Recent Labs  Lab 02/20/24 1438  AST 28  ALT 11  ALKPHOS 55  BILITOT 0.3  PROT 7.1  ALBUMIN 3.4*   No results for input(s): LIPASE, AMYLASE in the last 168 hours. Recent Labs  Lab 02/20/24 1548  AMMONIA 23   Diabetic: No results for input(s): HGBA1C in the last 72 hours. Recent Labs  Lab 02/19/24 1332 02/19/24 1644 02/19/24 1652  02/20/24 1451  GLUCAP 58* 80 91 113*   Cardiac Enzymes: No results for input(s): CKTOTAL, CKMB, CKMBINDEX, TROPONINI in the last 168 hours. No results for input(s): PROBNP in the last 8760 hours. Coagulation Profile: No results for input(s): INR, PROTIME in the last 168 hours. Thyroid Function Tests: Recent Labs    02/20/24 1630  TSH 1.277   Lipid Profile: No results for input(s): CHOL, HDL, LDLCALC, TRIG, CHOLHDL, LDLDIRECT in the last 72 hours. Anemia Panel: No results for input(s): VITAMINB12, FOLATE, FERRITIN, TIBC, IRON, RETICCTPCT in the last 72 hours. Urine analysis:    Component Value Date/Time   COLORURINE YELLOW 02/20/2024 1744   APPEARANCEUR HAZY (A) 02/20/2024 1744   LABSPEC 1.013 02/20/2024 1744   PHURINE 7.0 02/20/2024 1744   GLUCOSEU NEGATIVE 02/20/2024 1744   HGBUR NEGATIVE 02/20/2024  1744   BILIRUBINUR NEGATIVE 02/20/2024 1744   KETONESUR NEGATIVE 02/20/2024 1744   PROTEINUR NEGATIVE 02/20/2024 1744   UROBILINOGEN 1.0 11/28/2013 0143   NITRITE NEGATIVE 02/20/2024 1744   LEUKOCYTESUR MODERATE (A) 02/20/2024 1744   Sepsis Labs: Invalid input(s): PROCALCITONIN, LACTICIDVEN  Microbiology: No results found for this or any previous visit (from the past 240 hours).  Radiology Studies: CT Head Wo Contrast Result Date: 02/20/2024 CLINICAL DATA:  Mental status changes EXAM: CT HEAD WITHOUT CONTRAST TECHNIQUE: Contiguous axial images were obtained from the base of the skull through the vertex without intravenous contrast. RADIATION DOSE REDUCTION: This exam was performed according to the departmental dose-optimization program which includes automated exposure control, adjustment of the mA and/or kV according to patient size and/or use of iterative reconstruction technique. COMPARISON:  02/19/2024 FINDINGS: Brain: Similar extensive brain atrophy pattern and white matter microvascular ischemic changes throughout the cerebral  hemispheres. Associated ventricular enlargement. No acute intracranial hemorrhage, mass lesion, new infarction, midline shift, or herniation. Cerebellar atrophy as well. No extra-axial fluid collection. No focal mass effect or edema. Vascular: Intracranial atherosclerosis noted.  No hyperdense vessel. Skull: No osseous abnormality or fracture. Right anterior frontal scalp hematoma again noted extending along the right nasal bridge. Sinuses/Orbits: No acute finding. Other: None. IMPRESSION: 1. Stable atrophy and white matter microvascular ischemic changes. 2. No acute intracranial abnormality by noncontrast CT. 3. Right anterior frontal scalp hematoma extending along the right nasal bridge. Electronically Signed   By: CHRISTELLA.  Shick M.D.   On: 02/20/2024 16:08   DG Chest Portable 1 View Result Date: 02/20/2024 CLINICAL DATA:  Altered mental status, recent fall. EXAM: PORTABLE CHEST 1 VIEW COMPARISON:  Radiograph 11/21/2023 FINDINGS: Lung volumes are low. Stable heart size and mediastinal contours. Aortic atherosclerosis. No pneumothorax, focal airspace disease or large pleural effusion. No pulmonary edema. Chronic left shoulder arthropathy. IMPRESSION: Low lung volumes without acute abnormality. Electronically Signed   By: Andrea Gasman M.D.   On: 02/20/2024 15:48   DG Pelvis Portable Result Date: 02/20/2024 CLINICAL DATA:  Recent fall. EXAM: PORTABLE PELVIS 1-2 VIEWS COMPARISON:  Radiograph 11/21/2023 FINDINGS: The bones are subjectively under mineralized. The cortical margins of the bony pelvis are intact. No fracture. Pubic symphysis and sacroiliac joints are congruent. Both femoral heads are well-seated in the respective acetabula. Bilateral hip osteoarthritis. IMPRESSION: No pelvic fracture. Bilateral hip osteoarthritis. Electronically Signed   By: Andrea Gasman M.D.   On: 02/20/2024 15:46      Enos Muhl T. Anavi Branscum [facility]  If 7PM-7AM, please contact night-coverage [email or web address] 02/21/2024, 10:44  AM

## 2024-02-21 NOTE — Evaluation (Signed)
 Speech Language Pathology Evaluation Patient Details Name: Jennifer Garrett MRN: [id number] DOB: 12-Sep-1931 Today's Date: 02/21/2024 Time: 1410-1420 SLP Time Calculation (min) (ACUTE ONLY): 10 min  Problem List:  Patient Active Problem List   Diagnosis Date Noted   Altered mental status 02/20/2024   Acute metabolic encephalopathy 11/08/2023   Type 2 diabetes mellitus with complication, without long-term current use of insulin  (HCC) 03/22/2022   Vascular dementia (HCC) 03/22/2022   Seizure-like activity (HCC) 03/22/2022   Syncope and collapse with seizure like activity and bradycardia 03/22/2022   Acute renal failure superimposed on stage 3b chronic kidney disease (HCC) 03/22/2022   Normocytic anemia 03/22/2022   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/12/2017   Preop cardiovascular exam 03/18/2017   Essential hypertension 03/18/2017   Dyslipidemia 03/18/2017   Coronary artery disease involving native coronary artery of native heart without angina pectoris 03/18/2017   Bruit 03/18/2017   Malignant neoplasm metastatic to lung (HCC) 06/23/2016   Malignant neoplasm of lower-inner quadrant of right breast of female, estrogen receptor negative (HCC) 01/22/2016   Past Medical History:  Past Medical History:  Diagnosis Date   Arthritis    Asthma    rarely uses neb   Breast cancer (HCC) 05/25/2016   right breast   Breast cancer of lower-inner quadrant of right female breast (HCC) 01/22/2016   Carpal tunnel syndrome    Coronary artery disease    DES to OM3 90% stenosis 2003, 90% small RCA stenosis.     Dementia (HCC)    Diabetes mellitus    not taken glipizide  in over a year, checks surgars daily   History of stomach ulcers    Hyperlipemia    Hypertension    Irregular heart beat    Vascular dementia [facility])    Past Surgical History:  Past Surgical History:  Procedure Laterality Date   ABDOMINAL HYSTERECTOMY     CARDIAC CATHETERIZATION  2003   stent-   CARPAL TUNNEL  RELEASE  03/29/2012   Procedure: CARPAL TUNNEL RELEASE;  Surgeon: Arley JONELLE Curia, MD;  Location: [facility];  Service: Orthopedics;  Laterality: Left;   EYE SURGERY     catracts   LESION EXCISION WITH COMPLEX REPAIR Right 05/25/2016   Procedure: complex repair of 25cm wound;  Surgeon: Earlis Ranks, MD;  Location: [facility] OR;  Service: Plastics;  Laterality: Right;   MASTECTOMY     right   MASTECTOMY MODIFIED RADICAL Right 05/25/2016   Procedure: RIGHT MASTECTOMY MODIFIED RADICAL;  Surgeon: Elon Pacini, MD;  Location: [facility] OR;  Service: General;  Laterality: Right;   PUNCH BIOPSY OF SKIN Left 04/04/2017   Procedure: PUNCH BIOPSY OF LEFT BREAST SKIN;  Surgeon: Pacini Elon, MD;  Location: [facility];  Service: General;  Laterality: Left;   RADIOACTIVE SEED GUIDED AXILLARY SENTINEL LYMPH NODE Left 04/04/2017   Procedure: RADIOACTIVE SEED TARGETED LEFT AXILLARY LYMPH NODE EXCISION;  Surgeon: Pacini Elon, MD;  Location: [facility];  Service: General;  Laterality: Left;   SHOULDER ARTHROSCOPY     right and left   TRIGGER FINGER RELEASE  03/29/2012   Procedure: RELEASE TRIGGER FINGER/A-1 PULLEY;  Surgeon: Arley JONELLE Curia, MD;  Location: [facility];  Service: Orthopedics;  Laterality: Left;   HPI:  Jennifer Garrett is an 89 yo female with PMH including advanced vascular dementia, CKD 3A, stage IV breast cancer in remission, HLD, and recurrent falls. She presents to ED 8/4 with AMS after being seen the day  prior s/p fall with R frontal skin laceration with repair and d/c back to memory care. Seen by SLP April 2025 cognitively based dysphagia and was ultimately recommended Dys 2 solids with thin liquids.   Assessment / Plan / Recommendation Clinical Impression  Pt is alert and able to feed herself. No s/s of aspiration were observed with consecutive sips of thin liquids. She fed herself purees and soft solids with prolonged mastication secondary to  edentulism but ultimate oral clearance. Recommend  Dys 2 diet with thin liquids and full supervision to provide careful hand feeding. This appears consistent with pt's baseline. SLP will sign off acutely.    SLP Assessment  SLP Visit Diagnosis: Dysphagia, unspecified (R13.10)     Assistance Recommended at Discharge     Functional Status Assessment Patient has not had a recent decline in their functional status  Frequency and Duration           SLP Evaluation Cognition          Comprehension       Expression     Oral / Motor  Oral Motor/Sensory Function Overall Oral Motor/Sensory Function: Within functional limits            Damien Blumenthal, M.A., CCC-SLP Speech Language Pathology, Acute Rehabilitation Services  Secure Chat preferred [phone]  02/21/2024, 2:34 PM

## 2024-02-21 NOTE — Plan of Care (Signed)
 Did eat 100% of her supper.  Does become combative and tries to hit and kick caregivers when incont care provided.  Said she does not want anyone messing in her panties.  Said that is for her only.  Was reminded that we will be gentle and we do tell her what we need to do before doing anything for her.   Problem: Clinical Measurements: Goal: Will remain free from infection Outcome: Progressing   Problem: Clinical Measurements: Goal: Diagnostic test results will improve Outcome: Progressing   Problem: Clinical Measurements: Goal: Respiratory complications will improve Outcome: Progressing   Problem: Coping: Goal: Level of anxiety will decrease Outcome: Progressing   Problem: Safety: Goal: Ability to remain free from injury will improve Outcome: Progressing   Problem: Skin Integrity: Goal: Risk for impaired skin integrity will decrease Outcome: Progressing

## 2024-02-21 NOTE — Plan of Care (Signed)
 Updated patient's daughter, Rollene over the phone.  Per Rollene, patient does not recognize her at baseline.

## 2024-02-21 NOTE — TOC CM/SW Note (Signed)
 Transition of Care [facility]) - Inpatient Brief Assessment   Patient Details  Name: Jennifer Garrett MRN: [id number] Date of Birth: 12/03/1931  Transition of Care [facility]) CM/SW Contact:    Jennifer CHRISTELLA Goodie, LCSW Phone Number: 02/21/2024, 1:40 PM   Clinical Narrative:    Patient is a resident at [facility], can return when medically stable. CSW to follow.    Transition of Care Asessment: Insurance and Status: Insurance coverage has been reviewed Patient has primary care physician: Yes Home environment has been reviewed: [facility] Prior level of function:: Needs assist Prior/Current Home Services: No current home services Social Drivers of Health Review: SDOH reviewed no interventions necessary Readmission risk has been reviewed: Yes Transition of care needs: transition of care needs identified, TOC will continue to follow

## 2024-02-21 NOTE — Evaluation (Signed)
 Occupational Therapy Evaluation Patient Details Name: Jennifer Garrett MRN: [id number] DOB: 03-13-1932 Today's Date: 02/21/2024   History of Present Illness   Pt is a 89 y/o female presenting on 8/4 from [facility] with AMS. Noted seen in ED on 8/3 after fall with R frontal scalp laceration. CT head negative for acute abnormality.  PMH - HTN, arthritis, breast cancer, irregular heart beat, CAD, disbetes and vascular dementia.     Clinical Impressions Pt admitted for above and limited by problem list below.  Pt unable to provide PLOF, per chart resides at [facility] memory care. Pt able to state name and birthday month, but otherwise not oriented.  Requires total assist +2 for bed mobility, able to sit EOB progressing to contact guard assist.  Total assist for ADLs, pt resistive to UE movement.  R eye closed throughout session, bruising noted; pt scanning with L eye around room.  Will follow acutely, but recommend return to memory care upon discharge.      If plan is discharge home, recommend the following:   Other (comment) (total care)     Functional Status Assessment   Patient has had a recent decline in their functional status and demonstrates the ability to make significant improvements in function in a reasonable and predictable amount of time.     Equipment Recommendations   Other (comment) (defer)     Recommendations for Other Services         Precautions/Restrictions   Precautions Precautions: Fall Recall of Precautions/Restrictions: Impaired Restrictions Weight Bearing Restrictions Per Provider Order: No     Mobility Bed Mobility Overal bed mobility: Needs Assistance Bed Mobility: Supine to Sit, Sit to Supine     Supine to sit: Total assist, +2 for physical assistance Sit to supine: Total assist, +2 for physical assistance        Transfers Overall transfer level: Needs assistance Equipment used: 2 person hand held  assist Transfers: Sit to/from Stand Sit to Stand: Total assist, +2 physical assistance           General transfer comment: TotalA +2 to lift hips up from bed and scoot towards right      Balance Overall balance assessment: Needs assistance Sitting-balance support: Feet supported Sitting balance-Leahy Scale: Poor Sitting balance - Comments: Initially requiring modA, right lateral and posterior lean, but able to progress to CGA with increased time                                   ADL either performed or assessed with clinical judgement   ADL Overall ADL's : Needs assistance/impaired     Grooming: Total assistance;Wash/dry face                               Functional mobility during ADLs: +2 for physical assistance;Total assistance General ADL Comments: total assist +2     Vision   Vision Assessment?: Vision impaired- to be further tested in functional context Additional Comments: pt able to scan to L and R using L eye, R eye closed and pt unable to open (looks bruised)     Perception         Praxis         Pertinent Vitals/Pain Pain Assessment Pain Assessment: Faces Faces Pain Scale: No hurt Pain Intervention(s): Monitored during session     Extremity/Trunk Assessment  Upper Extremity Assessment Upper Extremity Assessment: Difficult to assess due to impaired cognition (pt resistive to UE ROM, difficult to assess)   Lower Extremity Assessment Lower Extremity Assessment: Defer to PT evaluation   Cervical / Trunk Assessment Cervical / Trunk Assessment: Kyphotic   Communication Communication Communication: Impaired Factors Affecting Communication: Reduced clarity of speech;Difficulty expressing self   Cognition Arousal: Lethargic Behavior During Therapy: Flat affect Cognition: History of cognitive impairments             OT - Cognition Comments: hx of vascular dementia, oriented to self only                  Following commands: Impaired Following commands impaired:  (not following commands)     Cueing  General Comments   Cueing Techniques: Verbal cues;Tactile cues;Visual cues;Gestural cues      Exercises     Shoulder Instructions      Home Living Family/patient expects to be discharged to:: Skilled nursing facility                                 Additional Comments: LTC at memory care      Prior Functioning/Environment Prior Level of Function : Other (comment)             Mobility Comments: recent falls ADLs Comments: assume assist for ADL's    OT Problem List: Decreased strength;Decreased activity tolerance;Impaired balance (sitting and/or standing);Decreased safety awareness;Decreased cognition;Decreased knowledge of precautions   OT Treatment/Interventions: Self-care/ADL training;Therapeutic activities;Patient/family education      OT Goals(Current goals can be found in the care plan section)   Acute Rehab OT Goals Patient Stated Goal: unable OT Goal Formulation: Patient unable to participate in goal setting Time For Goal Achievement: 03/06/24 Potential to Achieve Goals: Fair   OT Frequency:  Min 1X/week    Co-evaluation PT/OT/SLP Co-Evaluation/Treatment: Yes Reason for Co-Treatment: Necessary to address cognition/behavior during functional activity;For patient/therapist safety;To address functional/ADL transfers PT goals addressed during session: Mobility/safety with mobility OT goals addressed during session: ADL's and self-care      AM-PAC OT 6 Clicks Daily Activity     Outcome Measure Help from another person eating meals?: Total Help from another person taking care of personal grooming?: Total Help from another person toileting, which includes using toliet, bedpan, or urinal?: Total Help from another person bathing (including washing, rinsing, drying)?: Total Help from another person to put on and taking off regular upper body  clothing?: Total Help from another person to put on and taking off regular lower body clothing?: Total 6 Click Score: 6   End of Session Equipment Utilized During Treatment: Gait belt Nurse Communication: Mobility status;Precautions  Activity Tolerance: Patient limited by fatigue Patient left: in bed;with call bell/phone within reach;with bed alarm set  OT Visit Diagnosis: Other abnormalities of gait and mobility (R26.89);Muscle weakness (generalized) (M62.81)                Time: 9144-9085 OT Time Calculation (min): 19 min Charges:  OT General Charges $OT Visit: 1 Visit OT Evaluation $OT Eval Moderate Complexity: 1 Mod  Etta NOVAK, OT Acute Rehabilitation Services Office [phone] Secure Chat Preferred    Etta GORMAN Hope 02/21/2024, 10:32 AM

## 2024-02-22 DIAGNOSIS — E785 Hyperlipidemia, unspecified: Secondary | ICD-10-CM | POA: Diagnosis not present

## 2024-02-22 DIAGNOSIS — R400 Somnolence: Secondary | ICD-10-CM | POA: Diagnosis not present

## 2024-02-22 DIAGNOSIS — D649 Anemia, unspecified: Secondary | ICD-10-CM | POA: Diagnosis not present

## 2024-02-22 DIAGNOSIS — F01518 Vascular dementia, unspecified severity, with other behavioral disturbance: Secondary | ICD-10-CM | POA: Diagnosis not present

## 2024-02-22 LAB — CBC
HCT: 25.2 % — ABNORMAL LOW (ref 36.0–46.0)
Hemoglobin: 8 g/dL — ABNORMAL LOW (ref 12.0–15.0)
MCH: 28.4 pg (ref 26.0–34.0)
MCHC: 31.7 g/dL (ref 30.0–36.0)
MCV: 89.4 fL (ref 80.0–100.0)
Platelets: 135 K/uL — ABNORMAL LOW (ref 150–400)
RBC: 2.82 MIL/uL — ABNORMAL LOW (ref 3.87–5.11)
RDW: 13 % (ref 11.5–15.5)
WBC: 4.9 K/uL (ref 4.0–10.5)
nRBC: 0 % (ref 0.0–0.2)

## 2024-02-22 LAB — RENAL FUNCTION PANEL
Albumin: 2.6 g/dL — ABNORMAL LOW (ref 3.5–5.0)
Anion gap: 8 (ref 5–15)
BUN: 19 mg/dL (ref 8–23)
CO2: 23 mmol/L (ref 22–32)
Calcium: 8.8 mg/dL — ABNORMAL LOW (ref 8.9–10.3)
Chloride: 108 mmol/L (ref 98–111)
Creatinine, Ser: 1.08 mg/dL — ABNORMAL HIGH (ref 0.44–1.00)
GFR, Estimated: 49 mL/min — ABNORMAL LOW (ref >60)
Glucose, Bld: 91 mg/dL (ref 70–99)
Phosphorus: 3.5 mg/dL (ref 2.5–4.6)
Potassium: 4.3 mmol/L (ref 3.5–5.1)
Sodium: 139 mmol/L (ref 135–145)

## 2024-02-22 LAB — MAGNESIUM: Magnesium: 1.7 mg/dL (ref 1.7–2.4)

## 2024-02-22 LAB — RPR: RPR Ser Ql: NONREACTIVE

## 2024-02-22 MED ORDER — QUETIAPINE FUMARATE 25 MG PO TABS: 25.0000 mg | ORAL_TABLET | Freq: Two times a day (BID) | ORAL | 0 refills | Status: AC | PRN

## 2024-02-22 MED ORDER — SENNOSIDES-DOCUSATE SODIUM 8.6-50 MG PO TABS: 1.0000 | ORAL_TABLET | Freq: Two times a day (BID) | ORAL | 0 refills | Status: AC | PRN

## 2024-02-22 NOTE — TOC Initial Note (Signed)
 Transition of Care [facility]) - Initial/Assessment Note    Patient Details  Name: Jennifer Garrett MRN: [id number] Date of Birth: [birth year]  Transition of Care [facility]) CM/SW Contact:    Almarie CHRISTELLA Goodie, LCSW Phone Number: 02/22/2024, 10:04 AM  Clinical Narrative:     CSW contacted [facility] about patient returning. CSW discussed patient's care needs and answered questions, and per [facility], they cannot manage patient if she is total care. Patient was previously ambulatory. CSW sent message to RN and MD, will attempt to mobilize patient and report back. CSW to follow.              Expected Discharge Plan: Memory Care Barriers to Discharge: Continued Medical Work up   Patient Goals and CMS Choice Patient states their goals for this hospitalization and ongoing recovery are:: patient unable to participate in goal setting, not fully oriented CMS Medicare.gov Compare Post Acute Care list provided to:: Patient Represenative (must comment) Choice offered to / list presented to : Adult Children      Expected Discharge Plan and Services     Post Acute Care Choice: Nursing Home Living arrangements for the past 2 months: Assisted Living Facility Expected Discharge Date: 02/22/24                                    Prior Living Arrangements/Services Living arrangements for the past 2 months: Assisted Living Facility Lives with:: Facility Resident                   Activities of Daily Living   ADL Screening (condition at time of admission) Independently performs ADLs?: No (Unable to assess)  Permission Sought/Granted                  Emotional Assessment   Attitude/Demeanor/Rapport: Unable to Assess Affect (typically observed): Unable to Assess Orientation: : Oriented to Self Alcohol / Substance Use: Not Applicable Psych Involvement: No (comment)  Admission diagnosis:  Altered mental status [R41.82] Urinary tract infection without hematuria, site  unspecified [N39.0] Altered mental status, unspecified altered mental status type [R41.82] Acute metabolic encephalopathy [G93.41] Patient Active Problem List   Diagnosis Date Noted   Altered mental status 02/20/2024   Acute metabolic encephalopathy 11/08/2023   Type 2 diabetes mellitus with complication, without long-term current use of insulin  (HCC) 03/22/2022   Vascular dementia (HCC) 03/22/2022   Seizure-like activity (HCC) 03/22/2022   Syncope and collapse with seizure like activity and bradycardia 03/22/2022   Acute renal failure superimposed on stage 3b chronic kidney disease (HCC) 03/22/2022   Normocytic anemia 03/22/2022   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 [place] [address] Phone: [phone] Fax: [phone]  [place] [facility] [address] [place] [address] Phone: [phone] Fax: [phone]     Social Drivers of Health (SDOH) Social History: SDOH Screenings   Food Insecurity: Patient Unable To Answer (02/20/2024)  Housing: Patient Unable To Answer (02/20/2024)  Transportation Needs: Patient Unable To Answer (02/20/2024)  Utilities: Patient Unable To Answer (02/20/2024)  Social Connections: Patient Unable To Answer (02/20/2024)  Tobacco Use: Medium Risk (02/20/2024)   SDOH Interventions:     Readmission Risk Interventions     No data to display

## 2024-02-22 NOTE — Plan of Care (Signed)

## 2024-02-22 NOTE — Progress Notes (Signed)
 PROGRESS NOTE  Jennifer Garrett FMW:[id number] DOB: 29-Dec-1931   PCP: Shelda Atlas, MD  [facility].  Per daughter,, does not recognize her but ambulates independently.   DOA: 02/20/2024 LOS: 1  Chief complaints Chief Complaint  Patient presents with   Altered Mental Status     Brief Narrative / Interim history: 89 year old F with PMH of advanced vascular dementia, CAD/PCI, CKD-3A, stage IV breast cancer in remission, HLD and recurrent falls brought to ED by EMS due to altered mental status and slumping over, and admitted with altered mental status.  She was seen in ED prior day after she had a fall and hit her head with right frontal skin laceration that was repaired in ED and discharged back to memory care.  In ED, vitals, basic labs, ammonia, UA and TSH without significant finding.  CT head without acute finding other than right anterior frontal scalp hematoma extending along the right nasal bridge.  CXR with low lung volumes.  Pelvic x-ray without acute finding.  Received IV ceftriaxone .  Urine culture sent, and admitted for further care.  Patient has waxing and waning mental status.     Subjective: Seen and examined earlier this morning.  No major events overnight of this morning.  She is awake and oriented to self.  She says she is hungry.  Does not appear to be in distress.  Objective: Vitals:   02/21/24 1920 02/21/24 2324 02/22/24 0400 02/22/24 0732  BP: 123/86 (!) 145/72 (!) 167/53 (!) 135/91  Pulse: 91 61 64 64  Resp: 18 18 18 12   Temp: 99.3 F (37.4 C) 98.9 F (37.2 C) 98.8 F (37.1 C) 98.2 F (36.8 C)  TempSrc: Oral Axillary Oral Oral  SpO2: 100% 100%  98%    Examination:  GENERAL: No apparent distress.  Nontoxic. HEENT: MMM.  Vision and hearing grossly intact.  NECK: Supple.  No apparent JVD.  RESP:  No IWOB.  Fair aeration bilaterally. CVS:  RRR. Heart sounds normal.  ABD/GI/GU: BS+. Abd soft, NTND.  MSK/EXT:  Moves  extremities. No apparent deformity. No edema.  SKIN: no apparent skin lesion or wound NEURO: Awake.  Oriented to self.  No facial asymmetry.  Moves all extremities. PSYCH: Calm. Normal affect.   Consultants:  None  Procedures: None  Microbiology summarized: Urine culture pending  Assessment and plan: Acute metabolic encephalopathy with background of vascular dementia: Reportedly new change from baseline noticed at facility. Per daughter, does not recognize her but ambulates independently before her recent fall for which she was evaluated in ED on 8/3. Imaging were negative for acute finding.   Repeat CT head today is again negative for acute pathology.  UA not convincing for UTI.  Basic labs without significant finding.  Ammonia, B12, TSH and RPR unrevealing.  Wonder if she had concussion from fall.  Also on Ativan  and Remeron  at home.  She is currently awake and oriented to self but confused and agitated at times.  No focal neurodeficit. -Discontinue Ativan  and mirtazapine  -Reorientation and delirium precaution -On dysphagia 2 diet per SLP -PT/OT eval   Fall, prior to arrival: Seen in ED on 8/3.  Sustained right frontal scalp laceration and hematoma.  Laceration repaired in ED.  Imaging was negative for traumatic injury.  Repeat CT head without acute finding. -Suture removal in 5 to 7 days from 8/3.   -Continue fall precautions,  -PT/OT eval.  Vascular dementia with behavioral disturbance:  -See acute metabolic encephalopathy -Continue home  Depakote  and Aricept  when able to take p.o.   Essential hypertension: BP within acceptable range. -Continue holding olmesartan for now.   Coronary artery disease: -Continue continue aspirin  and statin when able to take p.o.   Chronic normocytic anemia: Stable -Monitor  Physical deconditioning: Reportedly independent for ambulation at baseline before his recent fall. - PT/OT  CKD-3A: Stable  - Monitor   Hyperlipidemia: -Continue  continue statin.  Pyuria: Doubt UTI - Follow urine culture  There is no height or weight on file to calculate BMI.           DVT prophylaxis:  heparin  injection 5,000 Units Start: 02/20/24 2200  Code Status: DNR Family Communication: Updated patient's daughter over the phone. Level of care: Med-Surg Status is: Inpatient The patient will remain inpatient because: Acute metabolic encephalopathy and physical deconditioning   Final disposition: To be determined   55 minutes with more than 50% spent in reviewing records, counseling patient/family and coordinating care.   Sch Meds:  Scheduled Meds:  aspirin   81 mg Oral Daily   divalproex   250 mg Oral BID   donepezil   10 mg Oral Daily   heparin   5,000 Units Subcutaneous Q8H   pravastatin   40 mg Oral q1800   Continuous Infusions:  lactated ringers      PRN Meds:.acetaminophen  **OR** acetaminophen , ondansetron  **OR** ondansetron  (ZOFRAN ) IV, senna-docusate  Antimicrobials: Anti-infectives (From admission, onward)    Start     Dose/Rate Route Frequency Ordered Stop   02/20/24 1815  cefTRIAXone  (ROCEPHIN ) 1 g in sodium chloride  0.9 % 100 mL IVPB        1 g 200 mL/hr over 30 Minutes Intravenous  Once 02/20/24 1801 02/21/24 0114        I have personally reviewed the following labs and images: CBC: Recent Labs  Lab 02/20/24 1438 02/21/24 0453 02/22/24 0441  WBC 6.4 5.8 4.9  HGB 9.1* 8.7* 8.0*  HCT 29.3* 28.1* 25.2*  MCV 92.1 91.2 89.4  PLT 170 153 135*   BMP &GFR Recent Labs  Lab 02/20/24 1438 02/21/24 0453 02/22/24 0441  NA 137 140 139  K 5.0 4.7 4.3  CL 109 110 108  CO2 23 22 23   GLUCOSE 109* 75 91  BUN 24* 22 19  CREATININE 1.31* 1.17* 1.08*  CALCIUM  9.0 9.1 8.8*  MG  --   --  1.7  PHOS  --   --  3.5   Estimated Creatinine Clearance: 25.4 mL/min (A) (by C-G formula based on SCr of 1.08 mg/dL (H)). Liver & Pancreas: Recent Labs  Lab 02/20/24 1438 02/22/24 0441  AST 28  --   ALT 11  --    ALKPHOS 55  --   BILITOT 0.3  --   PROT 7.1  --   ALBUMIN 3.4* 2.6*   No results for input(s): LIPASE, AMYLASE in the last 168 hours. Recent Labs  Lab 02/20/24 1548  AMMONIA 23   Diabetic: No results for input(s): HGBA1C in the last 72 hours. Recent Labs  Lab 02/19/24 1332 02/19/24 1644 02/19/24 1652 02/20/24 1451  GLUCAP 58* 80 91 113*   Cardiac Enzymes: No results for input(s): CKTOTAL, CKMB, CKMBINDEX, TROPONINI in the last 168 hours. No results for input(s): PROBNP in the last 8760 hours. Coagulation Profile: No results for input(s): INR, PROTIME in the last 168 hours. Thyroid Function Tests: Recent Labs    02/20/24 1630  TSH 1.277   Lipid Profile: No results for input(s): CHOL, HDL, LDLCALC, TRIG, CHOLHDL, LDLDIRECT in the  last 72 hours. Anemia Panel: Recent Labs    02/21/24 1132  VITAMINB12 2,399*   Urine analysis:    Component Value Date/Time   COLORURINE YELLOW 02/20/2024 1744   APPEARANCEUR HAZY (A) 02/20/2024 1744   LABSPEC 1.013 02/20/2024 1744   PHURINE 7.0 02/20/2024 1744   GLUCOSEU NEGATIVE 02/20/2024 1744   HGBUR NEGATIVE 02/20/2024 1744   BILIRUBINUR NEGATIVE 02/20/2024 1744   KETONESUR NEGATIVE 02/20/2024 1744   PROTEINUR NEGATIVE 02/20/2024 1744   UROBILINOGEN 1.0 11/28/2013 0143   NITRITE NEGATIVE 02/20/2024 1744   LEUKOCYTESUR MODERATE (A) 02/20/2024 1744   Sepsis Labs: Invalid input(s): PROCALCITONIN, LACTICIDVEN  Microbiology: No results found for this or any previous visit (from the past 240 hours).  Radiology Studies: No results found.     Novia Lansberry T. Birgitta Uhlir [facility]  If 7PM-7AM, please contact night-coverage [email or web address] 02/22/2024, 1:19 PM

## 2024-02-23 ENCOUNTER — Other Ambulatory Visit: Payer: Self-pay

## 2024-02-23 DIAGNOSIS — G9341 Metabolic encephalopathy: Secondary | ICD-10-CM

## 2024-02-23 DIAGNOSIS — E785 Hyperlipidemia, unspecified: Secondary | ICD-10-CM | POA: Diagnosis not present

## 2024-02-23 DIAGNOSIS — I2510 Atherosclerotic heart disease of native coronary artery without angina pectoris: Secondary | ICD-10-CM | POA: Diagnosis not present

## 2024-02-23 DIAGNOSIS — R400 Somnolence: Secondary | ICD-10-CM | POA: Diagnosis not present

## 2024-02-23 MED ORDER — QUETIAPINE FUMARATE 25 MG PO TABS
25.0000 mg | ORAL_TABLET | Freq: Every day | ORAL | Status: DC
  Administered 2024-02-23 – 2024-02-27 (×6): 25 mg via ORAL
  Filled 2024-02-23 (×5): qty 1

## 2024-02-23 MED ORDER — ORAL CARE MOUTH RINSE: 15.0000 mL | OROMUCOSAL | Status: DC | PRN

## 2024-02-23 NOTE — Progress Notes (Signed)
 PROGRESS NOTE  Jennifer OSLAND FMW:[id number] DOB: 1931/08/13   PCP: Shelda Atlas, MD  [facility].  Per daughter,, does not recognize her but ambulates independently.   DOA: 02/20/2024 LOS: 2  Chief complaints Chief Complaint  Patient presents with   Altered Mental Status     Brief Narrative / Interim history: 89 year old F with PMH of advanced vascular dementia, CAD/PCI, CKD-3A, stage IV breast cancer in remission, HLD and recurrent falls brought to ED by EMS due to altered mental status and slumping over, and admitted with altered mental status.  She was seen in ED prior day after she had a fall and hit her head with right frontal skin laceration that was repaired in ED and discharged back to memory care.  In ED, vitals, basic labs, ammonia, UA and TSH without significant finding.  CT head without acute finding other than right anterior frontal scalp hematoma extending along the right nasal bridge.  CXR with low lung volumes.  Pelvic x-ray without acute finding.  Received IV ceftriaxone .  Urine culture sent, and admitted for further care.  Seems to be back to baseline from mental standpoint but physically deconditioned and not cooperative.  Therapy recommends LTC    Subjective: Seen and examined earlier this morning.  No major events overnight of this morning.  She is awake and alert.  She responds no, thank you to every question.   Objective: Vitals:   02/22/24 2259 02/23/24 0308 02/23/24 0749 02/23/24 1240  BP: (!) 127/42 (!) 141/47 105/68 (!) 152/92  Pulse: 60 (!) 51 (!) 52 63  Resp: 18 18 (!) 24 (!) 22  Temp: 98.7 F (37.1 C) 98.4 F (36.9 C) (!) 97.3 F (36.3 C) 98.3 F (36.8 C)  TempSrc: Oral Oral Oral Oral  SpO2:   97% 93%    Examination:  GENERAL: No apparent distress.  Nontoxic. HEENT: MMM.  Vision and hearing grossly intact.  NECK: Supple.  No apparent JVD.  RESP:  No IWOB.  Fair aeration bilaterally. CVS:  RRR. Heart  sounds normal.  ABD/GI/GU: BS+. Abd soft, NTND.  MSK/EXT:  Moves extremities. No apparent deformity. No edema.  SKIN: no apparent skin lesion or wound NEURO: Awake.  Not cooperative.  Speech clear.  No facial asymmetry.  Moves all extremities.  No apparent focal neurodeficit. PSYCH: Calm. Normal affect.   Consultants:  None  Procedures: None  Microbiology summarized: Urine culture pending  Assessment and plan: Acute metabolic encephalopathy with background of vascular dementia: Reportedly new change from baseline noticed at facility. Per daughter, does not recognize her but ambulates independently before her recent fall for which she was evaluated in ED on 8/3. Imaging were negative for acute finding.   Repeat CT head today is again negative for acute pathology.  UA not convincing for UTI.  Basic labs without significant finding.  Ammonia, B12, TSH and RPR unrevealing.  Wonder if she had concussion from fall.  Also on Ativan  and Remeron  at home. No focal neurodeficit.  She is awake and alert but not cooperative.  Wants to be left alone. -Discontinued Ativan  and mirtazapine  -Will try low-dose Seroquel  at night.  I have discussed this with her daughter previously. -Reorientation and delirium precaution -On dysphagia 2 diet per SLP -PT/OT eval   Fall, prior to arrival: Seen in ED on 8/3.  Sustained right frontal scalp laceration and hematoma.  Laceration repaired in ED.  Imaging was negative for traumatic injury.  Repeat CT head without acute  finding. -Suture removal in 5 to 7 days from 8/3.   -Continue fall precautions,  -PT/OT eval.  Vascular dementia with behavioral disturbance/delirium/agitation:  -See acute metabolic encephalopathy -Continue home Depakote  and Aricept  when able to take p.o. -Trial of low-dose Seroquel  at night   Essential hypertension: BP elevated at times partly due to agitation -Continue holding olmesartan for now.   Coronary artery disease: -Continue  continue aspirin  and statin when able to take p.o.   Chronic normocytic anemia: Stable -Monitor  Physical deconditioning: Reportedly independent for ambulation at baseline before his recent fall. - PT/OT  CKD-3A: Stable  - Monitor   Hyperlipidemia: -Continue continue statin.  Pyuria: Doubt UTI  There is no height or weight on file to calculate BMI.           DVT prophylaxis:  heparin  injection 5,000 Units Start: 02/20/24 2200  Code Status: DNR Family Communication: Updated patient's daughter over the phone on 8/6. Level of care: Med-Surg Status is: Inpatient The patient will remain inpatient because: Acute metabolic encephalopathy and physical deconditioning   Final disposition: LTC   35 minutes with more than 50% spent in reviewing records, counseling patient/family and coordinating care.   Sch Meds:  Scheduled Meds:  aspirin   81 mg Oral Daily   divalproex   250 mg Oral BID   donepezil   10 mg Oral Daily   heparin   5,000 Units Subcutaneous Q8H   pravastatin   40 mg Oral q1800   QUEtiapine   25 mg Oral QHS   Continuous Infusions:   PRN Meds:.acetaminophen  **OR** acetaminophen , ondansetron  **OR** ondansetron  (ZOFRAN ) IV, senna-docusate  Antimicrobials: Anti-infectives (From admission, onward)    Start     Dose/Rate Route Frequency Ordered Stop   02/20/24 1815  cefTRIAXone  (ROCEPHIN ) 1 g in sodium chloride  0.9 % 100 mL IVPB        1 g 200 mL/hr over 30 Minutes Intravenous  Once 02/20/24 1801 02/21/24 0114        I have personally reviewed the following labs and images: CBC: Recent Labs  Lab 02/20/24 1438 02/21/24 0453 02/22/24 0441  WBC 6.4 5.8 4.9  HGB 9.1* 8.7* 8.0*  HCT 29.3* 28.1* 25.2*  MCV 92.1 91.2 89.4  PLT 170 153 135*   BMP &GFR Recent Labs  Lab 02/20/24 1438 02/21/24 0453 02/22/24 0441  NA 137 140 139  K 5.0 4.7 4.3  CL 109 110 108  CO2 23 22 23   GLUCOSE 109* 75 91  BUN 24* 22 19  CREATININE 1.31* 1.17* 1.08*  CALCIUM  9.0  9.1 8.8*  MG  --   --  1.7  PHOS  --   --  3.5   Estimated Creatinine Clearance: 25.4 mL/min (A) (by C-G formula based on SCr of 1.08 mg/dL (H)). Liver & Pancreas: Recent Labs  Lab 02/20/24 1438 02/22/24 0441  AST 28  --   ALT 11  --   ALKPHOS 55  --   BILITOT 0.3  --   PROT 7.1  --   ALBUMIN 3.4* 2.6*   No results for input(s): LIPASE, AMYLASE in the last 168 hours. Recent Labs  Lab 02/20/24 1548  AMMONIA 23   Diabetic: No results for input(s): HGBA1C in the last 72 hours. Recent Labs  Lab 02/19/24 1332 02/19/24 1644 02/19/24 1652 02/20/24 1451  GLUCAP 58* 80 91 113*   Cardiac Enzymes: No results for input(s): CKTOTAL, CKMB, CKMBINDEX, TROPONINI in the last 168 hours. No results for input(s): PROBNP in the last 8760 hours. Coagulation Profile:  No results for input(s): INR, PROTIME in the last 168 hours. Thyroid Function Tests: Recent Labs    02/20/24 1630  TSH 1.277   Lipid Profile: No results for input(s): CHOL, HDL, LDLCALC, TRIG, CHOLHDL, LDLDIRECT in the last 72 hours. Anemia Panel: Recent Labs    02/21/24 1132  VITAMINB12 2,399*   Urine analysis:    Component Value Date/Time   COLORURINE YELLOW 02/20/2024 1744   APPEARANCEUR HAZY (A) 02/20/2024 1744   LABSPEC 1.013 02/20/2024 1744   PHURINE 7.0 02/20/2024 1744   GLUCOSEU NEGATIVE 02/20/2024 1744   HGBUR NEGATIVE 02/20/2024 1744   BILIRUBINUR NEGATIVE 02/20/2024 1744   KETONESUR NEGATIVE 02/20/2024 1744   PROTEINUR NEGATIVE 02/20/2024 1744   UROBILINOGEN 1.0 11/28/2013 0143   NITRITE NEGATIVE 02/20/2024 1744   LEUKOCYTESUR MODERATE (A) 02/20/2024 1744   Sepsis Labs: Invalid input(s): PROCALCITONIN, LACTICIDVEN  Microbiology: No results found for this or any previous visit (from the past 240 hours).  Radiology Studies: No results found.     Bradie Lacock T. Rosalina Dingwall [facility]  If 7PM-7AM, please contact night-coverage [email or web address] 02/23/2024, 1:49  PM

## 2024-02-23 NOTE — Care Management Important Message (Signed)
 Important Message  Patient Details  Name: KANDI BRUSSEAU MRN: [id number] Date of Birth: 1932/07/19   Important Message Given:  Yes - Medicare IM     Claretta Deed 02/23/2024, 4:12 PM

## 2024-02-23 NOTE — Plan of Care (Signed)
  Problem: Education: Goal: Knowledge of General Education information will improve Description: Including pain rating scale, medication(s)/side effects and non-pharmacologic comfort measures Outcome: Not Progressing   Problem: Health Behavior/Discharge Planning: Goal: Ability to manage health-related needs will improve Outcome: Not Progressing   Problem: Clinical Measurements: Goal: Ability to maintain clinical measurements within normal limits will improve Outcome: Progressing Goal: Will remain free from infection Outcome: Progressing Goal: Diagnostic test results will improve Outcome: Progressing Goal: Respiratory complications will improve Outcome: Progressing Goal: Cardiovascular complication will be avoided Outcome: Not Progressing   Problem: Activity: Goal: Risk for activity intolerance will decrease Outcome: Not Progressing   Problem: Nutrition: Goal: Adequate nutrition will be maintained Outcome: Progressing   Problem: Elimination: Goal: Will not experience complications related to bowel motility Outcome: Not Progressing Goal: Will not experience complications related to urinary retention Outcome: Not Progressing   Problem: Pain Managment: Goal: General experience of comfort will improve and/or be controlled Outcome: Not Progressing   Problem: Safety: Goal: Ability to remain free from injury will improve Outcome: Progressing   Problem: Skin Integrity: Goal: Risk for impaired skin integrity will decrease Outcome: Progressing   Problem: Skin Integrity: Goal: Risk for impaired skin integrity will decrease Outcome: Progressing

## 2024-02-23 NOTE — Progress Notes (Signed)
 Physical Therapy Treatment Patient Details Name: Jennifer Garrett MRN: [id number] DOB: 04/04/1932 Today's Date: 02/23/2024   History of Present Illness Pt is a 89 y/o female presenting on 8/4 from [facility] with AMS. CT head negative for acute abnormality. Admitted with acute metabolic encephalopathy. Noted seen in ED on 8/3 after fall with R frontal scalp laceration. PMH - HTN, arthritis, breast cancer, irregular heart beat, CAD, disbetes and vascular dementia.   PT Comments  Pt in recliner upon arrival with chair alarm going off and pt attempting to stand. Pt with rambling speech and not following commands. Pt was able to stand with MinA and 1HH support. She was then able to ambulate ~54ft with MinA and 1HH. Deferred further gait distance as pt became agitated with TotalA to return to supine. Pt then promptly moved from supine to sit with CGA for safety. Dicussed with RN that pt seemed less restless when sitting in the recliner. TotalA+2 for safety to stand-pivot to the recliner due to pt resisting movement. D/c recs remain appropriate. Acute PT to continue to follow and assess if pt is able to meaningfully engage in acute PT at this time.      If plan is discharge home, recommend the following: A lot of help with walking and/or transfers;A lot of help with bathing/dressing/bathroom;Direct supervision/assist for medications management;Direct supervision/assist for financial management;Assist for transportation   Can travel by private vehicle     No  Equipment Recommendations  None recommended by PT       Precautions / Restrictions Precautions Precautions: Fall Recall of Precautions/Restrictions: Impaired Precaution/Restrictions Comments: posey waist belt Restrictions Weight Bearing Restrictions Per Provider Order: No     Mobility  Bed Mobility Overal bed mobility: Needs Assistance Bed Mobility: Supine to Sit, Sit to Supine    Supine to sit: Contact guard Sit to supine:  Total assist, +2 for safety/equipment, +2 for physical assistance   General bed mobility comments: TotalA to return to supine with pt resisting movement. Pt then promptly moved into sitting with CGA for safety and no physical assist. Returned to chair with hopes of decreasing restlessness    Transfers Overall transfer level: Needs assistance Equipment used: 1 person hand held assist Transfers: Sit to/from Stand, Bed to chair/wheelchair/BSC Sit to Stand: Min assist, Total assist, +2 safety/equipment   Step pivot transfers: Min assist      General transfer comment: From the recliner, pt was initiating standing with MinA via 1HH and support at gait belt. After ambulating a short distance and returning to supine, pt was restless and attempting to stand again. Stand-pivot transfer back to recliner with TotalA+2 for safety.    Ambulation/Gait Ambulation/Gait assistance: Min assist, +2 safety/equipment Gait Distance (Feet): 8 Feet Assistive device: 1 person hand held assist Gait Pattern/deviations: Step-through pattern, Decreased stride length, Shuffle Gait velocity: decr     General Gait Details: MinA via 1HH with pt staggering left/right. Pt not following commands and attempting to leave room. Deferred further gait distance due to safety concerns     Balance Overall balance assessment: Needs assistance Sitting-balance support: Feet supported Sitting balance-Leahy Scale: Fair Sitting balance - Comments: CGA for safety   Standing balance support: Single extremity supported, During functional activity, Reliant on assistive device for balance Standing balance-Leahy Scale: Poor Standing balance comment: reliant on UE and external support       Communication Communication Communication: Impaired Factors Affecting Communication: Reduced clarity of speech;Difficulty expressing self  Cognition Arousal: Alert Behavior During Therapy:  Impulsive, Restless, Agitated   PT - Cognitive  impairments: History of cognitive impairments    PT - Cognition Comments: Very restless with slight agitation during session. Not able to follow commands Following commands: Impaired Following commands impaired:  (not following commands)    Cueing Cueing Techniques: Verbal cues, Tactile cues, Visual cues, Gestural cues         Pertinent Vitals/Pain Pain Assessment Faces Pain Scale: No hurt Breathing: normal Negative Vocalization: occasional moan/groan, low speech, negative/disapproving quality Facial Expression: smiling or inexpressive Body Language: tense, distressed pacing, fidgeting Consolability: distracted or reassured by voice/touch PAINAD Score: 3     PT Goals (current goals can now be found in the care plan section) Acute Rehab PT Goals PT Goal Formulation: Patient unable to participate in goal setting Time For Goal Achievement: 03/06/24 Potential to Achieve Goals: Fair Progress towards PT goals: Progressing toward goals    Frequency    Min 1X/week       AM-PAC PT 6 Clicks Mobility   Outcome Measure  Help needed turning from your back to your side while in a flat bed without using bedrails?: A Little Help needed moving from lying on your back to sitting on the side of a flat bed without using bedrails?: A Little Help needed moving to and from a bed to a chair (including a wheelchair)?: A Little Help needed standing up from a chair using your arms (e.g., wheelchair or bedside chair)?: A Little Help needed to walk in hospital room?: A Lot Help needed climbing 3-5 steps with a railing? : Total 6 Click Score: 15    End of Session Equipment Utilized During Treatment: Gait belt Activity Tolerance: Other (comment) (limited by cognition) Patient left: in chair;with call bell/phone within reach;with chair alarm set Nurse Communication: Mobility status;Other (comment) (in room at end of session) PT Visit Diagnosis: Muscle weakness (generalized) (M62.81);History of  falling (Z91.81)     Time: 8553-8484 PT Time Calculation (min) (ACUTE ONLY): 29 min  Charges:    $Therapeutic Activity: 23-37 mins PT General Charges $$ ACUTE PT VISIT: 1 Visit                     Kate ORN, PT, DPT Secure Chat Preferred  Rehab Office [phone]   Kate BRAVO Wendolyn 02/23/2024, 3:47 PM

## 2024-02-23 NOTE — Plan of Care (Signed)

## 2024-02-23 NOTE — Progress Notes (Signed)
 PT Cancellation Note  Patient Details Name: Jennifer Garrett MRN: [id number] DOB: [birth year]   Cancelled Treatment:    Reason Eval/Treat Not Completed: (P) Patient declined, no reason specified (Attempted to initiate LE exercise and advancement toward EOB, but pt resisted and repeatedly refused mobility. Will continue to follow per PT POC.)   Darryle Decicco 02/23/2024, 12:20 PM

## 2024-02-24 DIAGNOSIS — N390 Urinary tract infection, site not specified: Secondary | ICD-10-CM | POA: Diagnosis not present

## 2024-02-24 DIAGNOSIS — R400 Somnolence: Secondary | ICD-10-CM | POA: Diagnosis not present

## 2024-02-24 DIAGNOSIS — E785 Hyperlipidemia, unspecified: Secondary | ICD-10-CM | POA: Diagnosis not present

## 2024-02-24 MED ORDER — IRBESARTAN 75 MG PO TABS: 37.5000 mg | ORAL_TABLET | Freq: Every day | ORAL | Status: DC

## 2024-02-24 NOTE — Discharge Summary (Signed)
 Physician Discharge Summary  Jennifer Garrett FMW:[id number] DOB: September 19, 1931 DOA: 02/20/2024  PCP: Shelda Atlas, MD  Admit date: 02/20/2024 Discharge date: 02/24/2024  Admitted From: SNF Disposition:  SNF  Recommendations for Outpatient Follow-up:  Follow up with PCP in 1-2 weeks Remove stitches in 7 days  Home Health:NO Equipment/Devices:None  Discharge Condition:Stable CODE STATUS:Full Diet recommendation: Heart Healthy  Brief/Interim Summary: You may copy/paste interim summary or write brief hospital course depending on length of stay  Discharge Diagnoses:  Principal Problem:   Altered mental status Active Problems:   Dyslipidemia   Coronary artery disease involving native coronary artery of native heart without angina pectoris   Vascular dementia (HCC)   Normocytic anemia   Acute metabolic encephalopathy  Acute metabolic encephalopathy in the setting of vascular dementia: Imaging showed no acute findings. UA not convincing for UTI.  Ammonia, B12 TSH and RPR were unrevealing. Will start her on low-dose Seroquel . On a dysphagia 2 diet. Physical therapy evaluated the patient will need to go back to long-term rehab. Continue Depakote  and Aricept    Fall: Right frontal scalp laceration and hematoma repaired in the ED. CT of the head showed no other acute findings. Remove sutures in 7 days.   Essential hypertension: Goal blood pressure on her is less than 160/90. Holding ARB might need to go to rehab without ARB.   Coronary artery disease: Continue aspirin .   Anemia of chronic renal disease: Hemoglobin stable.   Chronic renal stage IIIa: Monitor.   Hyperlipidemia: Continue statins.    Discharge Instructions  Discharge Instructions     Diet - low sodium heart healthy   Complete by: As directed    Discharge wound care:   Complete by: As directed    Remove stitches in 7 days.   Increase activity slowly   Complete by: As directed    Increase activity slowly    Complete by: As directed       Allergies as of 02/24/2024       Reactions   Lyrica [pregabalin] Swelling   Not listed on the [facility]   Feldene [piroxicam] Rash   Not listed on the [facility]   Orudis [ketoprofen] Rash   Not listed on the [facility]   Vibramycin [doxycycline Calcium ] Hives, Rash   Not listed on the [facility]   Zestril [lisinopril] Other (See Comments)   Light sensitivity   Not listed on the [facility]        Medication List     STOP taking these medications    LORazepam  0.5 MG tablet Commonly known as: ATIVAN    mirtazapine  7.5 MG tablet Commonly known as: REMERON        TAKE these medications    aspirin  81 MG chewable tablet Chew 81 mg by mouth daily.   Calcium  600 + D 600-5 MG-MCG Tabs Generic drug: Calcium  Carb-Cholecalciferol Take 1 tablet by mouth daily.   cyanocobalamin  1000 MCG tablet Take 1 tablet (1,000 mcg total) by mouth daily.   divalproex  250 MG DR tablet Commonly known as: DEPAKOTE  Take 1 tablet (250 mg total) by mouth 2 (two) times daily.   donepezil  10 MG tablet Commonly known as: ARICEPT  Take 10 mg by mouth daily.   lovastatin 40 MG tablet Commonly known as: MEVACOR Take 40 mg by mouth daily.   olmesartan 40 MG tablet Commonly known as: BENICAR Take 40 mg by mouth daily.   QUEtiapine  25 MG tablet Commonly known as: SEROquel  Take 1 tablet (25 mg total) by mouth 3 times/day as needed-between  meals & bedtime (agitation and/or sleep).   senna-docusate 8.6-50 MG tablet Commonly known as: Senokot-S Take 1 tablet by mouth 2 (two) times daily between meals as needed for mild constipation.               Discharge Care Instructions  (From admission, onward)           Start     Ordered   02/24/24 0000  Discharge wound care:       Comments: Remove stitches in 7 days.   02/24/24 0721            Allergies  Allergen Reactions   Lyrica [Pregabalin] Swelling    Not listed on the [facility]   Feldene [Piroxicam] Rash    Not listed on the  [facility]    Orudis [Ketoprofen] Rash    Not listed on the [facility]    Vibramycin [Doxycycline Calcium ] Hives and Rash    Not listed on the [facility]    Zestril [Lisinopril] Other (See Comments)    Light sensitivity   Not listed on the [facility]     Consultations: None   Procedures/Studies: CT Head Wo Contrast Result Date: 02/20/2024 CLINICAL DATA:  Mental status changes EXAM: CT HEAD WITHOUT CONTRAST TECHNIQUE: Contiguous axial images were obtained from the base of the skull through the vertex without intravenous contrast. RADIATION DOSE REDUCTION: This exam was performed according to the departmental dose-optimization program which includes automated exposure control, adjustment of the mA and/or kV according to patient size and/or use of iterative reconstruction technique. COMPARISON:  02/19/2024 FINDINGS: Brain: Similar extensive brain atrophy pattern and white matter microvascular ischemic changes throughout the cerebral hemispheres. Associated ventricular enlargement. No acute intracranial hemorrhage, mass lesion, new infarction, midline shift, or herniation. Cerebellar atrophy as well. No extra-axial fluid collection. No focal mass effect or edema. Vascular: Intracranial atherosclerosis noted.  No hyperdense vessel. Skull: No osseous abnormality or fracture. Right anterior frontal scalp hematoma again noted extending along the right nasal bridge. Sinuses/Orbits: No acute finding. Other: None. IMPRESSION: 1. Stable atrophy and white matter microvascular ischemic changes. 2. No acute intracranial abnormality by noncontrast CT. 3. Right anterior frontal scalp hematoma extending along the right nasal bridge. Electronically Signed   By: CHRISTELLA.  Shick M.D.   On: 02/20/2024 16:08   DG Chest Portable 1 View Result Date: 02/20/2024 CLINICAL DATA:  Altered mental status, recent fall. EXAM: PORTABLE CHEST 1 VIEW COMPARISON:  Radiograph 11/21/2023 FINDINGS: Lung volumes are low. Stable heart size and mediastinal contours.  Aortic atherosclerosis. No pneumothorax, focal airspace disease or large pleural effusion. No pulmonary edema. Chronic left shoulder arthropathy. IMPRESSION: Low lung volumes without acute abnormality. Electronically Signed   By: Andrea Gasman M.D.   On: 02/20/2024 15:48   DG Pelvis Portable Result Date: 02/20/2024 CLINICAL DATA:  Recent fall. EXAM: PORTABLE PELVIS 1-2 VIEWS COMPARISON:  Radiograph 11/21/2023 FINDINGS: The bones are subjectively under mineralized. The cortical margins of the bony pelvis are intact. No fracture. Pubic symphysis and sacroiliac joints are congruent. Both femoral heads are well-seated in the respective acetabula. Bilateral hip osteoarthritis. IMPRESSION: No pelvic fracture. Bilateral hip osteoarthritis. Electronically Signed   By: Andrea Gasman M.D.   On: 02/20/2024 15:46   CT Head Wo Contrast Result Date: 02/19/2024 EXAM: CT HEAD, FACIAL BONES AND CERVICAL SPINE WITHOUT CONTRAST 02/19/2024 06:57:38 PM TECHNIQUE: CT of the head, facial bones and cervical spine was performed without the administration of intravenous contrast. Multiplanar reformatted images are provided for review. Automated exposure control, iterative  reconstruction, and/or weight based adjustment of the mA/kV was utilized to reduce the radiation dose to as low as reasonably achievable. COMPARISON: 11/24/2023 CLINICAL HISTORY: Head trauma, minor (Age >= 65y). Pt VERY COMBATIVE did not hold still FINDINGS: CT HEAD BRAIN AND VENTRICLES: No acute intracranial hemorrhage. No mass effect or midline shift. No extra-axial fluid collection. Gray-white differentiation is maintained. No hydrocephalus. Moderate volume loss and chronic ischemic white matter changes. SKULL AND SCALP: No acute skull fracture. Right frontal scalp hematoma. CT FACIAL BONES FACIAL BONES: No acute facial fracture. No mandibular dislocation. No suspicious bone lesion. ORBITS: No acute traumatic injury. SINUSES AND MASTOIDS: No acute abnormality.  SOFT TISSUES: Heterogeneous and enlarged thyroid gland. CT CERVICAL SPINE BONES AND ALIGNMENT: No acute fracture or traumatic malalignment. DEGENERATIVE CHANGES: No significant degenerative changes. SOFT TISSUES: No prevertebral soft tissue swelling. IMPRESSION: 1. No acute intracranial abnormality. 2. Right frontal scalp hematoma. 3. Heterogeneous and enlarged thyroid gland. Electronically signed by: Franky Stanford MD 02/19/2024 07:08 PM EDT RP Workstation: [id number]   CT Cervical Spine Wo Contrast Result Date: 02/19/2024 EXAM: CT HEAD, FACIAL BONES AND CERVICAL SPINE WITHOUT CONTRAST 02/19/2024 06:57:38 PM TECHNIQUE: CT of the head, facial bones and cervical spine was performed without the administration of intravenous contrast. Multiplanar reformatted images are provided for review. Automated exposure control, iterative reconstruction, and/or weight based adjustment of the mA/kV was utilized to reduce the radiation dose to as low as reasonably achievable. COMPARISON: 11/24/2023 CLINICAL HISTORY: Head trauma, minor (Age >= 65y). Pt VERY COMBATIVE did not hold still FINDINGS: CT HEAD BRAIN AND VENTRICLES: No acute intracranial hemorrhage. No mass effect or midline shift. No extra-axial fluid collection. Gray-white differentiation is maintained. No hydrocephalus. Moderate volume loss and chronic ischemic white matter changes. SKULL AND SCALP: No acute skull fracture. Right frontal scalp hematoma. CT FACIAL BONES FACIAL BONES: No acute facial fracture. No mandibular dislocation. No suspicious bone lesion. ORBITS: No acute traumatic injury. SINUSES AND MASTOIDS: No acute abnormality. SOFT TISSUES: Heterogeneous and enlarged thyroid gland. CT CERVICAL SPINE BONES AND ALIGNMENT: No acute fracture or traumatic malalignment. DEGENERATIVE CHANGES: No significant degenerative changes. SOFT TISSUES: No prevertebral soft tissue swelling. IMPRESSION: 1. No acute intracranial abnormality. 2. Right frontal scalp hematoma. 3.  Heterogeneous and enlarged thyroid gland. Electronically signed by: Franky Stanford MD 02/19/2024 07:08 PM EDT RP Workstation: [id number]   CT Maxillofacial Wo Contrast Result Date: 02/19/2024 EXAM: CT HEAD, FACIAL BONES AND CERVICAL SPINE WITHOUT CONTRAST 02/19/2024 06:57:38 PM TECHNIQUE: CT of the head, facial bones and cervical spine was performed without the administration of intravenous contrast. Multiplanar reformatted images are provided for review. Automated exposure control, iterative reconstruction, and/or weight based adjustment of the mA/kV was utilized to reduce the radiation dose to as low as reasonably achievable. COMPARISON: 11/24/2023 CLINICAL HISTORY: Head trauma, minor (Age >= 65y). Pt VERY COMBATIVE did not hold still FINDINGS: CT HEAD BRAIN AND VENTRICLES: No acute intracranial hemorrhage. No mass effect or midline shift. No extra-axial fluid collection. Gray-white differentiation is maintained. No hydrocephalus. Moderate volume loss and chronic ischemic white matter changes. SKULL AND SCALP: No acute skull fracture. Right frontal scalp hematoma. CT FACIAL BONES FACIAL BONES: No acute facial fracture. No mandibular dislocation. No suspicious bone lesion. ORBITS: No acute traumatic injury. SINUSES AND MASTOIDS: No acute abnormality. SOFT TISSUES: Heterogeneous and enlarged thyroid gland. CT CERVICAL SPINE BONES AND ALIGNMENT: No acute fracture or traumatic malalignment. DEGENERATIVE CHANGES: No significant degenerative changes. SOFT TISSUES: No prevertebral soft tissue swelling. IMPRESSION: 1. No acute intracranial abnormality.  2. Right frontal scalp hematoma. 3. Heterogeneous and enlarged thyroid gland. Electronically signed by: Franky Stanford MD 02/19/2024 07:08 PM EDT RP Workstation: [id number]   (Echo, Carotid, EGD, Colonoscopy, ERCP)    Subjective: No complaints  Discharge Exam: Vitals:   02/23/24 2334 02/24/24 0336  BP: 121/65 (!) 144/57  Pulse: 80 62  Resp: 18 18  Temp: 98.8 F  (37.1 C) 99.3 F (37.4 C)  SpO2: 100% 100%   Vitals:   02/23/24 1804 02/23/24 2005 02/23/24 2334 02/24/24 0336  BP:  (!) 154/102 121/65 (!) 144/57  Pulse: 82 78 80 62  Resp:  20 18 18   Temp:  99.2 F (37.3 C) 98.8 F (37.1 C) 99.3 F (37.4 C)  TempSrc:  Oral Axillary Oral  SpO2: 93% 95% 100% 100%  Weight:      Height:        General: Pt is alert, awake, not in acute distress Cardiovascular: RRR, S1/S2 +, no rubs, no gallops Respiratory: CTA bilaterally, no wheezing, no rhonchi Abdominal: Soft, NT, ND, bowel sounds + Extremities: no edema, no cyanosis    The results of significant diagnostics from this hospitalization (including imaging, microbiology, ancillary and laboratory) are listed below for reference.     Microbiology: No results found for this or any previous visit (from the past 240 hours).   Labs: BNP (last 3 results) No results for input(s): BNP in the last 8760 hours. Basic Metabolic Panel: Recent Labs  Lab 02/20/24 1438 02/21/24 0453 02/22/24 0441  NA 137 140 139  K 5.0 4.7 4.3  CL 109 110 108  CO2 23 22 23   GLUCOSE 109* 75 91  BUN 24* 22 19  CREATININE 1.31* 1.17* 1.08*  CALCIUM  9.0 9.1 8.8*  MG  --   --  1.7  PHOS  --   --  3.5   Liver Function Tests: Recent Labs  Lab 02/20/24 1438 02/22/24 0441  AST 28  --   ALT 11  --   ALKPHOS 55  --   BILITOT 0.3  --   PROT 7.1  --   ALBUMIN 3.4* 2.6*   No results for input(s): LIPASE, AMYLASE in the last 168 hours. Recent Labs  Lab 02/20/24 1548  AMMONIA 23   CBC: Recent Labs  Lab 02/20/24 1438 02/21/24 0453 02/22/24 0441  WBC 6.4 5.8 4.9  HGB 9.1* 8.7* 8.0*  HCT 29.3* 28.1* 25.2*  MCV 92.1 91.2 89.4  PLT 170 153 135*   Cardiac Enzymes: No results for input(s): CKTOTAL, CKMB, CKMBINDEX, TROPONINI in the last 168 hours. BNP: Invalid input(s): POCBNP CBG: Recent Labs  Lab 02/19/24 1332 02/19/24 1644 02/19/24 1652 02/20/24 1451  GLUCAP 58* 80 91 113*    D-Dimer No results for input(s): DDIMER in the last 72 hours. Hgb A1c No results for input(s): HGBA1C in the last 72 hours. Lipid Profile No results for input(s): CHOL, HDL, LDLCALC, TRIG, CHOLHDL, LDLDIRECT in the last 72 hours. Thyroid function studies No results for input(s): TSH, T4TOTAL, T3FREE, THYROIDAB in the last 72 hours.  Invalid input(s): FREET3 Anemia work up Recent Labs    02/21/24 1132  VITAMINB12 2,399*   Urinalysis    Component Value Date/Time   COLORURINE YELLOW 02/20/2024 1744   APPEARANCEUR HAZY (A) 02/20/2024 1744   LABSPEC 1.013 02/20/2024 1744   PHURINE 7.0 02/20/2024 1744   GLUCOSEU NEGATIVE 02/20/2024 1744   HGBUR NEGATIVE 02/20/2024 1744   BILIRUBINUR NEGATIVE 02/20/2024 1744   KETONESUR NEGATIVE 02/20/2024 1744   PROTEINUR NEGATIVE  02/20/2024 1744   UROBILINOGEN 1.0 11/28/2013 0143   NITRITE NEGATIVE 02/20/2024 1744   LEUKOCYTESUR MODERATE (A) 02/20/2024 1744   Sepsis Labs Recent Labs  Lab 02/20/24 1438 02/21/24 0453 02/22/24 0441  WBC 6.4 5.8 4.9   Microbiology No results found for this or any previous visit (from the past 240 hours).   Time coordinating discharge: Over 35 minutes  SIGNED:   Erle Odell Castor, MD  [facility] 02/24/2024, 7:21 AM Pager   If 7PM-7AM, please contact night-coverage [email or web address] Password [email or web address]

## 2024-02-24 NOTE — TOC Progression Note (Signed)
 Transition of Care [facility]) - Progression Note    Patient Details  Name: [name] MRN: [id number] Date of Birth: 08-23-1931  Transition of Care [facility]) CM/SW Contact  Almarie CHRISTELLA [place]ie, [place] Phone Number: 02/24/2024, 3:33 PM  Clinical Narrative:   CSW following for disposition. CSW coordinated with PT assigned to see patient to see about ambulation, patient refused to cooperate, could not ambulate. CSW to follow.    Expected Discharge Plan: Memory Care Barriers to Discharge: Continued Medical Work up               Expected Discharge Plan and Services     Post Acute Care Choice: Nursing Home Living arrangements for the past 2 months: Assisted Living Facility Expected Discharge Date: 02/24/24                                     Social Drivers of Health (SDOH) Interventions SDOH Screenings   Food Insecurity: Patient Unable To Answer (02/20/2024)  Housing: Patient Unable To Answer (02/20/2024)  Transportation Needs: Patient Unable To Answer (02/20/2024)  Utilities: Patient Unable To Answer (02/20/2024)  Social Connections: Patient Unable To Answer (02/20/2024)  Tobacco Use: Medium Risk (02/20/2024)    Readmission Risk Interventions     No data to display

## 2024-02-24 NOTE — [place] FL2 (Signed)
 [place]  MEDICAID FL2 LEVEL OF CARE FORM     IDENTIFICATION  Patient Name: Jennifer Garrett Birthdate: 16-Feb-1932 Sex: female Admission Date (Current Location): 02/20/2024  [facility] and [place] Number:  Producer, television/film/video and Address:  [facility], [address], [place], [place] [address]      Provider Number: [id number]  Attending Physician Name and Address:  Odell Castor, [place], MD  Relative Name and Phone Number:       Current Level of Care: Hospital Recommended Level of Care: Memory Care Prior Approval Number:    Date Approved/Denied:   PASRR Number:    Discharge Plan: Other (Comment) (Memory Care)    Current Diagnoses: Patient Active Problem List   Diagnosis Date Noted   Altered mental status 02/20/2024   Acute metabolic encephalopathy 11/08/2023   Type 2 diabetes mellitus with complication, without long-term current use of insulin  (HCC) 03/22/2022   Vascular dementia (HCC) 03/22/2022   Seizure-like activity (HCC) 03/22/2022   Syncope and collapse with seizure like activity and bradycardia 03/22/2022   Acute renal failure superimposed on stage 3b chronic kidney disease (HCC) 03/22/2022   Normocytic anemia 03/22/2022   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/12/2017   Preop cardiovascular exam 03/18/2017   Essential hypertension 03/18/2017   Dyslipidemia 03/18/2017   Coronary artery disease involving native coronary artery of native heart without angina pectoris 03/18/2017   Bruit 03/18/2017   Malignant neoplasm metastatic to lung (HCC) 06/23/2016   Malignant neoplasm of lower-inner quadrant of right breast of female, estrogen receptor negative (HCC) 01/22/2016    Orientation RESPIRATION BLADDER Height & Weight     Self  Normal Incontinent Weight: 111 lb (50.3 kg) Height:  5' (152.4 cm)  BEHAVIORAL SYMPTOMS/MOOD NEUROLOGICAL BOWEL NUTRITION STATUS      Incontinent Diet (finger foods)  AMBULATORY STATUS COMMUNICATION OF  NEEDS Skin   Limited Assist Verbally Bruising                       Personal Care Assistance Level of Assistance  Bathing, Feeding, Dressing Bathing Assistance: Limited assistance Feeding assistance: Limited assistance Dressing Assistance: Limited assistance     Functional Limitations Info  Speech     Speech Info: Impaired (dysarthria)    SPECIAL CARE FACTORS FREQUENCY                       Contractures Contractures Info: Not present    Additional Factors Info  Code Status, Allergies, Psychotropic Code Status Info: DNR Allergies Info: Lyrica (Pregabalin), Feldene (Piroxicam), Orudis (Ketoprofen), Vibramycin (Doxycycline Calcium ), Zestril (Lisinopril) Psychotropic Info: Depakote  250mg  2x/day; Aricept  10mg  daily; Seroquel  25mg  daily at bed           Discharge Medications: STOP taking these medications     LORazepam  0.5 MG tablet Commonly known as: ATIVAN     mirtazapine  7.5 MG tablet Commonly known as: REMERON            TAKE these medications     aspirin  81 MG chewable tablet Chew 81 mg by mouth daily.    Calcium  600 + D 600-5 MG-MCG Tabs Generic drug: Calcium  Carb-Cholecalciferol Take 1 tablet by mouth daily.    cyanocobalamin  1000 MCG tablet Take 1 tablet (1,000 mcg total) by mouth daily.    divalproex  250 MG DR tablet Commonly known as: DEPAKOTE  Take 1 tablet (250 mg total) by mouth 2 (two) times daily.    donepezil  10 MG tablet Commonly  known as: ARICEPT  Take 10 mg by mouth daily.    lovastatin 40 MG tablet Commonly known as: MEVACOR Take 40 mg by mouth daily.    olmesartan 40 MG tablet Commonly known as: BENICAR Take 40 mg by mouth daily.    QUEtiapine  25 MG tablet Commonly known as: SEROquel  Take 1 tablet (25 mg total) by mouth 3 times/day as needed-between meals & bedtime (agitation and/or sleep).    senna-docusate 8.6-50 MG tablet Commonly known as: Senokot-S Take 1 tablet by mouth 2 (two) times daily between meals as  needed for mild constipation.     Relevant Imaging Results:  Relevant Lab Results:   Additional Information SS#: [id number]  Almarie CHRISTELLA Goodie, LCSW

## 2024-02-24 NOTE — Plan of Care (Signed)

## 2024-02-24 NOTE — TOC Progression Note (Signed)
 Transition of Care [facility]) - Progression Note    Patient Details  Name: Jennifer Garrett MRN: [id number] Date of Birth: 01/04/1932  Transition of [facility]) CM/SW Contact  Almarie CHRISTELLA Goodie, [place] Phone Number: 02/24/2024, 3:33 PM  Clinical Narrative:   CSW contacted [facility] to discuss patient returning, but clinical manager is out of office and will not return Monday, no one can review patient's information to confirm that she can return. CSW notified TOC Supervisor that [facility] will not agree for patient to return today. CSW attempted to reach daughter, Rollene, left a voicemail to provide update. MD and RN notified about barriers to discharge. CSW to follow.    Expected Discharge Plan: Memory Care Barriers to Discharge: Continued Medical Work up               Expected Discharge Plan and Services     Post Acute Care Choice: Nursing Home Living arrangements for the past 2 months: Assisted Living Facility Expected Discharge Date: 02/24/24                                     Social Drivers of Health (SDOH) Interventions SDOH Screenings   Food Insecurity: Patient Unable To Answer (02/20/2024)  Housing: Patient Unable To Answer (02/20/2024)  Transportation Needs: Patient Unable To Answer (02/20/2024)  Utilities: Patient Unable To Answer (02/20/2024)  Social Connections: Patient Unable To Answer (02/20/2024)  Tobacco Use: Medium Risk (02/20/2024)    Readmission Risk Interventions     No data to display

## 2024-02-24 NOTE — Progress Notes (Incomplete)
 [facility] PROGRESS NOTE    Progress Note  Jennifer Garrett  FMW:[id number] DOB: Jul 13, 1932 DOA: 02/20/2024 PCP: Shelda Atlas, MD     Brief Narrative:   Jennifer Garrett is an 89 y.o. female past medical history of advanced vascular dementia, CAD/PAD, chronic kidney disease stage IIIa, stage IV breast cancer in remission brought into the ED for recurrent falls and altered mental status.  CT of the head showed no acute findings, but did show right scalp hematoma, chest x-ray no acute findings.  Assessment/Plan:   Acute metabolic encephalopathy in the setting of vascular dementia: Imaging showed no acute findings. UA not convincing for UTI.  Ammonia, B12 TSH and RPR were unrevealing. Ativan  and Remeron  were discontinued. Will start on low-dose Seroquel . On a dysphagia 2 diet. Physical therapy evaluated the patient will need to go back to long-term rehab. Continue Depakote  and Aricept   Fall: Right frontal scalp laceration and hematoma repaired in the ED. CT of the head showed no other acute findings. Remove sutures in 7 days.  Essential hypertension: Goal blood pressure on her is less than 160/90. Holding ARB might need to go to rehab without ARB.  Coronary artery disease: Continue aspirin .  Anemia of chronic renal disease: Hemoglobin stable.  Chronic renal stage IIIa: Monitor.  Hyperlipidemia: Continue statins.  Active Problems:   Dyslipidemia   Coronary artery disease involving native coronary artery of native heart without angina pectoris   Vascular dementia (HCC)   Normocytic anemia   Acute metabolic encephalopathy  RN Pressure Injury Documentation:    Estimated body mass index is 21.68 kg/m as calculated from the following:   Height as of this encounter: 5' (1.524 m).   Weight as of this encounter: 50.3 kg. Malnutrition Type:      Malnutrition Characteristics:      Nutrition Interventions:       DVT prophylaxis: *** Family  Communication:*** Status is: Inpatient {Inpatient:23812}    Code Status:     Code Status Orders  (From admission, onward)           Start     Ordered   02/20/24 2133  Do not attempt resuscitation (DNR) Pre-Arrest Interventions Desired  Continuous       Question Answer Comment  If pulseless and not breathing No CPR or chest compressions.   In Pre-Arrest Conditions (Patient Has Pulse and Is Breathing) May intubate, use advanced airway interventions and cardioversion/ACLS medications if appropriate or indicated. May transfer to ICU.   Consent: Discussion documented in EHR or advanced directives reviewed      02/20/24 2133           Code Status History     Date Active Date Inactive Code Status Order ID Comments User Context   11/08/2023 1454 11/14/2023 1852 Full Code [id number]  Verdene Purchase, MD ED   03/22/2022 1332 03/29/2022 2046 Full Code [id number]  Waddell Rake, MD ED   05/25/2016 1613 05/27/2016 1614 Full Code [id number]  Gail Favorite, MD Inpatient         IV Access:   Peripheral IV   Procedures and diagnostic studies:   No results found.   Medical Consultants:   None.   Subjective:    Jennifer Garrett   Objective:    Vitals:   02/23/24 1804 02/23/24 2005 02/23/24 2334 02/24/24 0336  BP:  (!) 154/102 121/65 (!) 144/57  Pulse: 82 78 80 62  Resp:  20 18 18   Temp:  99.2 F (37.3 C) 98.8 F (  37.1 C) 99.3 F (37.4 C)  TempSrc:  Oral Axillary Oral  SpO2: 93% 95% 100% 100%  Weight:      Height:       SpO2: 100 %   Intake/Output Summary (Last 24 hours) at 02/24/2024 0714 Last data filed at 02/24/2024 0007 Gross per 24 hour  Intake --  Output 450 ml  Net -450 ml   Filed Weights   02/23/24 1757  Weight: 50.3 kg    Exam: General exam: In no acute distress. Respiratory system: Good air movement and clear to auscultation. Cardiovascular system: S1 & S2 heard, RRR. No JVD, murmurs, rubs, gallops or clicks.  Gastrointestinal system: Abdomen  is nondistended, soft and nontender.  Central nervous system: Alert and oriented. No focal neurological deficits. Extremities: No pedal edema. Skin: No rashes, lesions or ulcers Psychiatry: Judgement and insight appear normal. Mood & affect appropriate.    Data Reviewed:    Labs: Basic Metabolic Panel: Recent Labs  Lab 02/20/24 1438 02/21/24 0453 02/22/24 0441  NA 137 140 139  K 5.0 4.7 4.3  CL 109 110 108  CO2 23 22 23   GLUCOSE 109* 75 91  BUN 24* 22 19  CREATININE 1.31* 1.17* 1.08*  CALCIUM  9.0 9.1 8.8*  MG  --   --  1.7  PHOS  --   --  3.5   GFR Estimated Creatinine Clearance: 25.4 mL/min (A) (by C-G formula based on SCr of 1.08 mg/dL (H)). Liver Function Tests: Recent Labs  Lab 02/20/24 1438 02/22/24 0441  AST 28  --   ALT 11  --   ALKPHOS 55  --   BILITOT 0.3  --   PROT 7.1  --   ALBUMIN 3.4* 2.6*   No results for input(s): LIPASE, AMYLASE in the last 168 hours. Recent Labs  Lab 02/20/24 1548  AMMONIA 23   Coagulation profile No results for input(s): INR, PROTIME in the last 168 hours. COVID-19 Labs  No results for input(s): DDIMER, FERRITIN, LDH, CRP in the last 72 hours.  Lab Results  Component Value Date   SARSCOV2NAA NEGATIVE 11/08/2023   SARSCOV2NAA NEGATIVE 09/28/2019    CBC: Recent Labs  Lab 02/20/24 1438 02/21/24 0453 02/22/24 0441  WBC 6.4 5.8 4.9  HGB 9.1* 8.7* 8.0*  HCT 29.3* 28.1* 25.2*  MCV 92.1 91.2 89.4  PLT 170 153 135*   Cardiac Enzymes: No results for input(s): CKTOTAL, CKMB, CKMBINDEX, TROPONINI in the last 168 hours. BNP (last 3 results) No results for input(s): PROBNP in the last 8760 hours. CBG: Recent Labs  Lab 02/19/24 1332 02/19/24 1644 02/19/24 1652 02/20/24 1451  GLUCAP 58* 80 91 113*   D-Dimer: No results for input(s): DDIMER in the last 72 hours. Hgb A1c: No results for input(s): HGBA1C in the last 72 hours. Lipid Profile: No results for input(s): CHOL, HDL,  LDLCALC, TRIG, CHOLHDL, LDLDIRECT in the last 72 hours. Thyroid function studies: No results for input(s): TSH, T4TOTAL, T3FREE, THYROIDAB in the last 72 hours.  Invalid input(s): FREET3 Anemia work up: Recent Labs    02/21/24 1132  VITAMINB12 2,399*   Sepsis Labs: Recent Labs  Lab 02/20/24 1438 02/21/24 0453 02/22/24 0441  WBC 6.4 5.8 4.9   Microbiology No results found for this or any previous visit (from the past 240 hours).   Medications:    aspirin   81 mg Oral Daily   divalproex   250 mg Oral BID   donepezil   10 mg Oral Daily   heparin   5,000  Units Subcutaneous Q8H   pravastatin   40 mg Oral q1800   QUEtiapine   25 mg Oral QHS   Continuous Infusions:    LOS: 3 days   Erle Odell Castor  [facility]  02/24/2024, 7:14 AM

## 2024-02-25 DIAGNOSIS — R400 Somnolence: Secondary | ICD-10-CM | POA: Diagnosis not present

## 2024-02-25 DIAGNOSIS — I2510 Atherosclerotic heart disease of native coronary artery without angina pectoris: Secondary | ICD-10-CM | POA: Diagnosis not present

## 2024-02-25 DIAGNOSIS — E785 Hyperlipidemia, unspecified: Secondary | ICD-10-CM | POA: Diagnosis not present

## 2024-02-25 DIAGNOSIS — G9341 Metabolic encephalopathy: Secondary | ICD-10-CM | POA: Diagnosis not present

## 2024-02-25 LAB — VITAMIN B1: Vitamin B1 (Thiamine): 93.5 nmol/L (ref 66.5–200.0)

## 2024-02-25 NOTE — Progress Notes (Signed)
 PROGRESS NOTE  Jennifer Garrett FMW:[id number] DOB: August 06, 1931   PCP: Shelda Atlas, MD  [facility]  Per daughter,, does not recognize her but ambulates independently.   DOA: 02/20/2024 LOS: 4  Chief complaints Chief Complaint  Patient presents with   Altered Mental Status     Brief Narrative / Interim history: 89 year old F with PMH of advanced vascular dementia, CAD/PCI, CKD-3A, stage IV breast cancer in remission, HLD and recurrent falls brought to ED by EMS due to altered mental status and slumping over, and admitted with altered mental status.  She was seen in ED prior day after she had a fall and hit her head with right frontal skin laceration that was repaired in ED and discharged back to memory care.  In ED, vitals, basic labs, ammonia, UA and TSH without significant finding.  CT head without acute finding other than right anterior frontal scalp hematoma extending along the right nasal bridge.  CXR with low lung volumes.  Pelvic x-ray without acute finding.  Received IV ceftriaxone .  Urine culture sent, and admitted for further care.  Seems to be back to baseline from mental standpoint but physically deconditioned and not cooperative.  Therapy recommends LTC    Subjective: Seen and examined earlier this morning.  No major events overnight or this morning.  Patient is awake and alert.  Oriented to self.  Objective: Vitals:   02/25/24 0056 02/25/24 0400 02/25/24 0737 02/25/24 1148  BP: (!) 151/67 (!) 142/66 (!) 136/50 (!) 118/43  Pulse: (!) 58 (!) 56 (!) 54 (!) 50  Resp: 17 16 14 16   Temp: 97.9 F (36.6 C) (!) 97.4 F (36.3 C) 97.7 F (36.5 C) 97.6 F (36.4 C)  TempSrc: Axillary Axillary Axillary Axillary  SpO2: 99% 99% 97%   Weight:      Height:        Examination:  GENERAL: No apparent distress.  Nontoxic. HEENT: MMM.  Vision and hearing grossly intact.  NECK: Supple.  No apparent JVD.  RESP:  No IWOB.  Fair aeration  bilaterally. CVS:  RRR. Heart sounds normal.  ABD/GI/GU: BS+. Abd soft, NTND.  MSK/EXT:  Moves extremities. No apparent deformity. No edema.  SKIN: no apparent skin lesion or wound NEURO: Awake and alert.  Oriented to self.  No facial asymmetry.  Moves all extremities. PSYCH: Calm. Normal affect.   Consultants:  None  Procedures: None  Microbiology summarized: Urine culture pending  Assessment and plan: Acute metabolic encephalopathy with background of vascular dementia: Reportedly new change from baseline noticed at facility. Per daughter, does not recognize her but ambulates independently before her recent fall for which she was evaluated in ED on 8/3. Imaging were negative for acute finding.   Repeat CT head today is again negative for acute pathology.  UA not convincing for UTI.  Basic labs without significant finding.  Ammonia, B12, TSH and RPR unrevealing.  Wonder if she had concussion from fall.  Also on Ativan  and Remeron  at home. No focal neurodeficit.  She is awake and alert but not cooperative.  Wants to be left alone. -Discontinued Ativan  and mirtazapine  due to risk of polypharmacy/sedation. - Started low-dose Seroquel  on 8/7.  Will continue -Reorientation and delirium precaution -On dysphagia 2 diet per SLP -PT/OT eval   Fall, prior to arrival: Seen in ED on 8/3.  Sustained right frontal scalp laceration and hematoma.  Laceration repaired in ED.  Imaging was negative for traumatic injury.  Repeat CT head without  acute finding. -Remove sutures. -Continue fall precautions,  -PT/OT eval.  Vascular dementia with behavioral disturbance/delirium/agitation:  -See acute metabolic encephalopathy -Continue home Depakote  and Aricept  when able to take p.o. -Low-dose Seroquel  as above   Essential hypertension: BP elevated at times partly due to agitation -Continue holding olmesartan for now.   Coronary artery disease: -Continue continue aspirin  and statin when able to take  p.o.   Chronic normocytic anemia: Stable -Monitor  Physical deconditioning: Reportedly independent for ambulation at baseline before his recent fall. - PT/OT-recommended LTC.  CKD-3A: Stable  - Monitor   Hyperlipidemia: -Continue continue statin.  Pyuria: Doubt UTI  Body mass index is 21.68 kg/m.           DVT prophylaxis:  heparin  injection 5,000 Units Start: 02/20/24 2200  Code Status: DNR Family Communication: None at bedside. Level of care: Med-Surg Status is: Inpatient The patient will remain inpatient because: Acute metabolic encephalopathy and physical deconditioning   Final disposition: LTC   35 minutes with more than 50% spent in reviewing records, counseling patient/family and coordinating care.   Sch Meds:  Scheduled Meds:  aspirin   81 mg Oral Daily   divalproex   250 mg Oral BID   donepezil   10 mg Oral Daily   heparin   5,000 Units Subcutaneous Q8H   pravastatin   40 mg Oral q1800   QUEtiapine   25 mg Oral QHS   Continuous Infusions:   PRN Meds:.acetaminophen  **OR** acetaminophen , ondansetron  **OR** ondansetron  (ZOFRAN ) IV, mouth rinse, senna-docusate  Antimicrobials: Anti-infectives (From admission, onward)    Start     Dose/Rate Route Frequency Ordered Stop   02/20/24 1815  cefTRIAXone  (ROCEPHIN ) 1 g in sodium chloride  0.9 % 100 mL IVPB        1 g 200 mL/hr over 30 Minutes Intravenous  Once 02/20/24 1801 02/21/24 0114        I have personally reviewed the following labs and images: CBC: Recent Labs  Lab 02/20/24 1438 02/21/24 0453 02/22/24 0441  WBC 6.4 5.8 4.9  HGB 9.1* 8.7* 8.0*  HCT 29.3* 28.1* 25.2*  MCV 92.1 91.2 89.4  PLT 170 153 135*   BMP &GFR Recent Labs  Lab 02/20/24 1438 02/21/24 0453 02/22/24 0441  NA 137 140 139  K 5.0 4.7 4.3  CL 109 110 108  CO2 23 22 23   GLUCOSE 109* 75 91  BUN 24* 22 19  CREATININE 1.31* 1.17* 1.08*  CALCIUM  9.0 9.1 8.8*  MG  --   --  1.7  PHOS  --   --  3.5   Estimated  Creatinine Clearance: 25.4 mL/min (A) (by C-G formula based on SCr of 1.08 mg/dL (H)). Liver & Pancreas: Recent Labs  Lab 02/20/24 1438 02/22/24 0441  AST 28  --   ALT 11  --   ALKPHOS 55  --   BILITOT 0.3  --   PROT 7.1  --   ALBUMIN 3.4* 2.6*   No results for input(s): LIPASE, AMYLASE in the last 168 hours. Recent Labs  Lab 02/20/24 1548  AMMONIA 23   Diabetic: No results for input(s): HGBA1C in the last 72 hours. Recent Labs  Lab 02/19/24 1332 02/19/24 1644 02/19/24 1652 02/20/24 1451  GLUCAP 58* 80 91 113*   Cardiac Enzymes: No results for input(s): CKTOTAL, CKMB, CKMBINDEX, TROPONINI in the last 168 hours. No results for input(s): PROBNP in the last 8760 hours. Coagulation Profile: No results for input(s): INR, PROTIME in the last 168 hours. Thyroid Function Tests: No results for  input(s): TSH, T4TOTAL, FREET4, T3FREE, THYROIDAB in the last 72 hours.  Lipid Profile: No results for input(s): CHOL, HDL, LDLCALC, TRIG, CHOLHDL, LDLDIRECT in the last 72 hours. Anemia Panel: No results for input(s): VITAMINB12, FOLATE, FERRITIN, TIBC, IRON, RETICCTPCT in the last 72 hours.  Urine analysis:    Component Value Date/Time   COLORURINE YELLOW 02/20/2024 1744   APPEARANCEUR HAZY (A) 02/20/2024 1744   LABSPEC 1.013 02/20/2024 1744   PHURINE 7.0 02/20/2024 1744   GLUCOSEU NEGATIVE 02/20/2024 1744   HGBUR NEGATIVE 02/20/2024 1744   BILIRUBINUR NEGATIVE 02/20/2024 1744   KETONESUR NEGATIVE 02/20/2024 1744   PROTEINUR NEGATIVE 02/20/2024 1744   UROBILINOGEN 1.0 11/28/2013 0143   NITRITE NEGATIVE 02/20/2024 1744   LEUKOCYTESUR MODERATE (A) 02/20/2024 1744   Sepsis Labs: Invalid input(s): PROCALCITONIN, LACTICIDVEN  Microbiology: No results found for this or any previous visit (from the past 240 hours).  Radiology Studies: No results found.     Shadiamond Koska T. Alarik Radu [facility]  If 7PM-7AM, please  contact night-coverage [email or web address] 02/25/2024, 2:57 PM

## 2024-02-26 DIAGNOSIS — E785 Hyperlipidemia, unspecified: Secondary | ICD-10-CM | POA: Diagnosis not present

## 2024-02-26 DIAGNOSIS — I2510 Atherosclerotic heart disease of native coronary artery without angina pectoris: Secondary | ICD-10-CM | POA: Diagnosis not present

## 2024-02-26 DIAGNOSIS — G9341 Metabolic encephalopathy: Secondary | ICD-10-CM | POA: Diagnosis not present

## 2024-02-26 DIAGNOSIS — R400 Somnolence: Secondary | ICD-10-CM | POA: Diagnosis not present

## 2024-02-26 NOTE — Progress Notes (Signed)
 PROGRESS NOTE  Jennifer Garrett FMW:[id number] DOB: 11-03-1931   PCP: Jennifer Atlas, MD  [facility]  Per daughter,, does not recognize her but ambulates independently.   DOA: 02/20/2024 LOS: 5  Chief complaints Chief Complaint  Patient presents with   Altered Mental Status     Brief Narrative / Interim history: 89 year old F with PMH of advanced vascular dementia, CAD/PCI, CKD-3A, stage IV breast cancer in remission, HLD and recurrent falls brought to ED by EMS due to altered mental status and slumping over, and admitted with altered mental status.  She was seen in ED prior day after she had a fall and hit her head with right frontal skin laceration that was repaired in ED and discharged back to memory care.  In ED, vitals, basic labs, ammonia, UA and TSH without significant finding.  CT head without acute finding other than right anterior frontal scalp hematoma extending along the right nasal bridge.  CXR with low lung volumes.  Pelvic x-ray without acute finding.  Received IV ceftriaxone .  Urine culture sent, and admitted for further care.  Seems to be back to baseline from mental standpoint but physically deconditioned and not cooperative.  Therapy recommends LTC    Subjective: Seen and examined earlier this morning.  No major events overnight or this morning.  Sleepy but wakes to voice.  Objective: Vitals:   02/25/24 1148 02/25/24 1506 02/25/24 1957 02/26/24 0827  BP: (!) 118/43 (!) 126/98 (!) 88/63 105/75  Pulse: (!) 50 68 (!) 59 (!) 59  Resp: 16 18 16 18   Temp: 97.6 F (36.4 C) 97.9 F (36.6 C) 98 F (36.7 C) (!) 97.5 F (36.4 C)  TempSrc: Axillary Axillary Oral Oral  SpO2:  93% 100% 99%  Weight:      Height:        Examination:  GENERAL: No apparent distress.  Nontoxic. HEENT: MMM.  Vision and hearing grossly intact.  NECK: Supple.  No apparent JVD.  RESP:  No IWOB.  Fair aeration bilaterally. CVS:  RRR. Heart sounds normal.   ABD/GI/GU: BS+. Abd soft, NTND.  MSK/EXT:  Moves extremities. No apparent deformity. No edema.  SKIN: Right frontal laceration.  Sutures removed. NEURO: Sleepy but wakes to voice.  No facial asymmetry.  Moves all extremities. PSYCH: Calm. Normal affect.   Consultants:  None  Procedures: None  Microbiology summarized: Urine culture pending  Assessment and plan: Acute metabolic encephalopathy with background of vascular dementia: Reportedly new change from baseline noticed at facility. Per daughter, does not recognize her but ambulates independently before her recent fall for which she was evaluated in ED on 8/3. Imaging were negative for acute finding.   Repeat CT head today is again negative for acute pathology.  UA not convincing for UTI.  Basic labs without significant finding.  Ammonia, B12, TSH and RPR unrevealing.  Wonder if she had concussion from fall.  Also on Ativan  and Remeron  at home. No focal neurodeficit.  Awake and oriented to self which seems to be her baseline. -Discontinued Ativan  and mirtazapine  due to risk of polypharmacy/sedation. -Started low-dose Seroquel  on 8/7.  Will continue -Reorientation and delirium precaution -On dysphagia 2 diet per SLP -PT/OT eval   Fall, prior to arrival: Seen in ED on 8/3.  Sustained right frontal scalp laceration and hematoma.  Laceration repaired in ED.  Imaging was negative for traumatic injury.  Repeat CT head without acute finding. -Sutures removed on 8/8. -Continue fall precautions,  -PT/OT eval.  Vascular dementia with behavioral disturbance/delirium/agitation:  -See acute metabolic encephalopathy -Continue home Depakote  and Aricept  when able to take p.o. -Low-dose Seroquel  as above   Essential hypertension: BP elevated at times partly due to agitation -Continue holding olmesartan for now.   Coronary artery disease: -Continue continue aspirin  and statin when able to take p.o.   Chronic normocytic anemia:  Stable -Monitor  Physical deconditioning: Reportedly independent for ambulation at baseline before his recent fall. - PT/OT-recommended LTC.  CKD-3A: Stable  - Monitor   Hyperlipidemia: -Continue continue statin.  Pyuria: Doubt UTI  Body mass index is 21.68 kg/m.           DVT prophylaxis:  heparin  injection 5,000 Units Start: 02/20/24 2200  Code Status: DNR Family Communication: None at bedside. Level of care: Med-Surg Status is: Inpatient The patient will remain inpatient because: Acute metabolic encephalopathy and physical deconditioning   Final disposition: LTC   35 minutes with more than 50% spent in reviewing records, counseling patient/family and coordinating care.   Sch Meds:  Scheduled Meds:  aspirin   81 mg Oral Daily   divalproex   250 mg Oral BID   donepezil   10 mg Oral Daily   heparin   5,000 Units Subcutaneous Q8H   pravastatin   40 mg Oral q1800   QUEtiapine   25 mg Oral QHS   Continuous Infusions:   PRN Meds:.acetaminophen  **OR** acetaminophen , ondansetron  **OR** ondansetron  (ZOFRAN ) IV, mouth rinse, senna-docusate  Antimicrobials: Anti-infectives (From admission, onward)    Start     Dose/Rate Route Frequency Ordered Stop   02/20/24 1815  cefTRIAXone  (ROCEPHIN ) 1 g in sodium chloride  0.9 % 100 mL IVPB        1 g 200 mL/hr over 30 Minutes Intravenous  Once 02/20/24 1801 02/21/24 0114        I have personally reviewed the following labs and images: CBC: Recent Labs  Lab 02/20/24 1438 02/21/24 0453 02/22/24 0441  WBC 6.4 5.8 4.9  HGB 9.1* 8.7* 8.0*  HCT 29.3* 28.1* 25.2*  MCV 92.1 91.2 89.4  PLT 170 153 135*   BMP &GFR Recent Labs  Lab 02/20/24 1438 02/21/24 0453 02/22/24 0441  NA 137 140 139  K 5.0 4.7 4.3  CL 109 110 108  CO2 23 22 23   GLUCOSE 109* 75 91  BUN 24* 22 19  CREATININE 1.31* 1.17* 1.08*  CALCIUM  9.0 9.1 8.8*  MG  --   --  1.7  PHOS  --   --  3.5   Estimated Creatinine Clearance: 25.4 mL/min (A) (by  C-G formula based on SCr of 1.08 mg/dL (H)). Liver & Pancreas: Recent Labs  Lab 02/20/24 1438 02/22/24 0441  AST 28  --   ALT 11  --   ALKPHOS 55  --   BILITOT 0.3  --   PROT 7.1  --   ALBUMIN 3.4* 2.6*   No results for input(s): LIPASE, AMYLASE in the last 168 hours. Recent Labs  Lab 02/20/24 1548  AMMONIA 23   Diabetic: No results for input(s): HGBA1C in the last 72 hours. Recent Labs  Lab 02/19/24 1332 02/19/24 1644 02/19/24 1652 02/20/24 1451  GLUCAP 58* 80 91 113*   Cardiac Enzymes: No results for input(s): CKTOTAL, CKMB, CKMBINDEX, TROPONINI in the last 168 hours. No results for input(s): PROBNP in the last 8760 hours. Coagulation Profile: No results for input(s): INR, PROTIME in the last 168 hours. Thyroid Function Tests: No results for input(s): TSH, T4TOTAL, FREET4, T3FREE, THYROIDAB in the last 72 hours.  Lipid Profile: No results for input(s): CHOL, HDL, LDLCALC, TRIG, CHOLHDL, LDLDIRECT in the last 72 hours. Anemia Panel: No results for input(s): VITAMINB12, FOLATE, FERRITIN, TIBC, IRON, RETICCTPCT in the last 72 hours.  Urine analysis:    Component Value Date/Time   COLORURINE YELLOW 02/20/2024 1744   APPEARANCEUR HAZY (A) 02/20/2024 1744   LABSPEC 1.013 02/20/2024 1744   PHURINE 7.0 02/20/2024 1744   GLUCOSEU NEGATIVE 02/20/2024 1744   HGBUR NEGATIVE 02/20/2024 1744   BILIRUBINUR NEGATIVE 02/20/2024 1744   KETONESUR NEGATIVE 02/20/2024 1744   PROTEINUR NEGATIVE 02/20/2024 1744   UROBILINOGEN 1.0 11/28/2013 0143   NITRITE NEGATIVE 02/20/2024 1744   LEUKOCYTESUR MODERATE (A) 02/20/2024 1744   Sepsis Labs: Invalid input(s): PROCALCITONIN, LACTICIDVEN  Microbiology: No results found for this or any previous visit (from the past 240 hours).  Radiology Studies: No results found.     Jennifer [facility] T. [facility]  If 7PM-7AM, please contact  night-coverage [email or web address] 02/26/2024, 11:20 AM

## 2024-02-27 DIAGNOSIS — R400 Somnolence: Secondary | ICD-10-CM | POA: Diagnosis not present

## 2024-02-27 DIAGNOSIS — D649 Anemia, unspecified: Secondary | ICD-10-CM | POA: Diagnosis not present

## 2024-02-27 DIAGNOSIS — G9341 Metabolic encephalopathy: Secondary | ICD-10-CM | POA: Diagnosis not present

## 2024-02-27 DIAGNOSIS — I2510 Atherosclerotic heart disease of native coronary artery without angina pectoris: Secondary | ICD-10-CM | POA: Diagnosis not present

## 2024-02-27 MED ORDER — AMLODIPINE BESYLATE 5 MG PO TABS: 5.0000 mg | ORAL_TABLET | Freq: Every day | ORAL | 2 refills | Status: AC

## 2024-02-27 NOTE — Discharge Summary (Signed)
 Physician Discharge Summary  Jennifer Garrett FMW:[id number] DOB: 09-19-1931 DOA: 02/20/2024  PCP: Shelda Atlas, MD  Admit date: 02/20/2024 Discharge date: 02/27/24  Admitted From: Memory care/ALF Disposition: Memory care/ALF Recommendations for Outpatient Follow-up:  Outpatient follow-up with PCP in 1 to 2 weeks Ongoing goal of care discussion Check CMP and CBC in 1 week Please follow up on the following pending results: None  Home Health: None Equipment/Devices: None  Discharge Condition: Stable CODE STATUS: DNR Diet Orders (From admission, onward)     Start     Ordered   02/21/24 1426  DIET DYS 2 Room service appropriate? No; Fluid consistency: Thin  Diet effective now       Question Answer Comment  Room service appropriate? No   Fluid consistency: Thin      02/21/24 1425              Hospital course 89 year old F with PMH of advanced vascular dementia, CAD/PCI, CKD-3A, stage IV breast cancer in remission, HLD and recurrent falls brought to ED by EMS due to altered mental status and slumping over, and admitted with altered mental status.  She was seen in ED prior day after she had a fall and hit her head with right frontal skin laceration that was repaired in ED and discharged back to memory care.   In ED, vitals, basic labs, ammonia, UA and TSH without significant finding.  CT head without acute finding other than right anterior frontal scalp hematoma extending along the right nasal bridge.  CXR with low lung volumes.  Pelvic x-ray without acute finding.  Received IV ceftriaxone . and admitted for further care.   Ativan  and Remeron  held.  Started on low-dose Seroquel  last night.  Seems to be back to baseline from mental standpoint but physically deconditioned and not cooperative.  Therapy recommends LTC.  Patient will be returning to memory care.   See individual problem list below for more.   Problems addressed during this hospitalization Acute metabolic  encephalopathy with background of vascular dementia: Reportedly new change from baseline noticed at facility. Per daughter, does not recognize her but ambulates independently before her recent fall for which she was evaluated in ED on 8/3. Imaging were negative for acute finding.   Repeat CT head today is again negative for acute pathology.  UA not convincing for UTI.  Basic labs without significant finding.  Ammonia, B12, TSH and RPR unrevealing.  Wonder if she had concussion from fall.  Also on Ativan  and Remeron  at home. No focal neurodeficit. Oriented to self which seems to be her baseline. -Discontinued Ativan  and mirtazapine  due to risk of polypharmacy/sedation. -Started low-dose Seroquel  on 8/7.  Will continue -Reorientation and delirium precaution -On dysphagia 2 diet per SLP   Fall, prior to arrival: Seen in ED on 8/3.  Sustained right frontal scalp laceration and hematoma.  Laceration repaired in ED.  Imaging was negative for traumatic injury.  Repeat CT head without acute finding. -Sutures removed on 8/8. -Continue fall precautions,  -PT/OT eval.   Vascular dementia with behavioral disturbance/delirium/agitation:  -See acute metabolic encephalopathy -Continue home Depakote  and Aricept  when able to take p.o. -Low-dose Seroquel  as above   Essential hypertension: BP within acceptable range. - Discontinue olmesartan and start low-dose amlodipine .   Coronary artery disease: -Continue continue aspirin  and statin when able to take p.o.   Chronic normocytic anemia: Stable -Monitor   Physical deconditioning: Reportedly independent for ambulation at baseline before his recent fall. - Consider therapy at memory  care if she cooperates.   CKD-3A: Stable  - Recheck in about a week   Hyperlipidemia: -Continue continue statin.   Pyuria: UTI ruled out.  Body mass index is 21.68 kg/m.           Consultations: None  Time spent 35  minutes  Vital signs Vitals:   02/26/24  1936 02/26/24 2355 02/27/24 0417 02/27/24 0848  BP: (!) 115/95 (!) 132/93 (!) 147/128 138/60  Pulse: 68 70  60  Temp: 99.4 F (37.4 C) 98.7 F (37.1 C) 98.4 F (36.9 C) 98.2 F (36.8 C)  Resp: 18 18 18 16   Height:      Weight:      SpO2:    95%  TempSrc: Oral Oral Oral Oral  BMI (Calculated):         Discharge exam GENERAL: No apparent distress.  Nontoxic. HEENT: MMM.  Vision and hearing grossly intact.  NECK: Supple.  No apparent JVD.  RESP:  No IWOB.  Fair aeration bilaterally. CVS:  RRR. Heart sounds normal.  ABD/GI/GU: BS+. Abd soft, NTND.  MSK/EXT:  Moves extremities. No apparent deformity. No edema.  SKIN: Right frontal laceration.  Sutures removed. NEURO: Sleepy but wakes to voice.  No facial asymmetry.  Moves all extremities. PSYCH: Calm. Normal affect.    Discharge Instructions Discharge Instructions     Increase activity slowly   Complete by: As directed    Increase activity slowly   Complete by: As directed       Allergies as of 02/27/2024       Reactions   Lyrica [pregabalin] Swelling   Not listed on the [facility]   Feldene [piroxicam] Rash   Not listed on the [facility]   Orudis [ketoprofen] Rash   Not listed on the [facility]   Vibramycin [doxycycline Calcium ] Hives, Rash   Not listed on the [facility]   Zestril [lisinopril] Other (See Comments)   Light sensitivity   Not listed on the [facility]        Medication List     STOP taking these medications    LORazepam  0.5 MG tablet Commonly known as: ATIVAN    mirtazapine  7.5 MG tablet Commonly known as: REMERON    olmesartan 40 MG tablet Commonly known as: BENICAR       TAKE these medications    amLODipine  5 MG tablet Commonly known as: NORVASC  Take 1 tablet (5 mg total) by mouth daily.   aspirin  81 MG chewable tablet Chew 81 mg by mouth daily.   Calcium  600 + D 600-5 MG-MCG Tabs Generic drug: Calcium  Carb-Cholecalciferol Take 1 tablet by mouth daily.   cyanocobalamin  1000 MCG tablet Take 1 tablet (1,000  mcg total) by mouth daily.   divalproex  250 MG DR tablet Commonly known as: DEPAKOTE  Take 1 tablet (250 mg total) by mouth 2 (two) times daily.   donepezil  10 MG tablet Commonly known as: ARICEPT  Take 10 mg by mouth daily.   lovastatin 40 MG tablet Commonly known as: MEVACOR Take 40 mg by mouth daily.   QUEtiapine  25 MG tablet Commonly known as: SEROquel  Take 1 tablet (25 mg total) by mouth 3 times/day as needed-between meals & bedtime (agitation and/or sleep).   senna-docusate 8.6-50 MG tablet Commonly known as: Senokot-S Take 1 tablet by mouth 2 (two) times daily between meals as needed for mild constipation.         Procedures/Studies:   CT Head Wo Contrast Result Date: 02/20/2024 CLINICAL DATA:  Mental status changes EXAM: CT HEAD WITHOUT  CONTRAST TECHNIQUE: Contiguous axial images were obtained from the base of the skull through the vertex without intravenous contrast. RADIATION DOSE REDUCTION: This exam was performed according to the departmental dose-optimization program which includes automated exposure control, adjustment of the mA and/or kV according to patient size and/or use of iterative reconstruction technique. COMPARISON:  02/19/2024 FINDINGS: Brain: Similar extensive brain atrophy pattern and white matter microvascular ischemic changes throughout the cerebral hemispheres. Associated ventricular enlargement. No acute intracranial hemorrhage, mass lesion, new infarction, midline shift, or herniation. Cerebellar atrophy as well. No extra-axial fluid collection. No focal mass effect or edema. Vascular: Intracranial atherosclerosis noted.  No hyperdense vessel. Skull: No osseous abnormality or fracture. Right anterior frontal scalp hematoma again noted extending along the right nasal bridge. Sinuses/Orbits: No acute finding. Other: None. IMPRESSION: 1. Stable atrophy and white matter microvascular ischemic changes. 2. No acute intracranial abnormality by noncontrast CT. 3.  Right anterior frontal scalp hematoma extending along the right nasal bridge. Electronically Signed   By: CHRISTELLA.  Shick M.D.   On: 02/20/2024 16:08   DG Chest Portable 1 View Result Date: 02/20/2024 CLINICAL DATA:  Altered mental status, recent fall. EXAM: PORTABLE CHEST 1 VIEW COMPARISON:  Radiograph 11/21/2023 FINDINGS: Lung volumes are low. Stable heart size and mediastinal contours. Aortic atherosclerosis. No pneumothorax, focal airspace disease or large pleural effusion. No pulmonary edema. Chronic left shoulder arthropathy. IMPRESSION: Low lung volumes without acute abnormality. Electronically Signed   By: Andrea Gasman M.D.   On: 02/20/2024 15:48   DG Pelvis Portable Result Date: 02/20/2024 CLINICAL DATA:  Recent fall. EXAM: PORTABLE PELVIS 1-2 VIEWS COMPARISON:  Radiograph 11/21/2023 FINDINGS: The bones are subjectively under mineralized. The cortical margins of the bony pelvis are intact. No fracture. Pubic symphysis and sacroiliac joints are congruent. Both femoral heads are well-seated in the respective acetabula. Bilateral hip osteoarthritis. IMPRESSION: No pelvic fracture. Bilateral hip osteoarthritis. Electronically Signed   By: Andrea Gasman M.D.   On: 02/20/2024 15:46   CT Head Wo Contrast Result Date: 02/19/2024 EXAM: CT HEAD, FACIAL BONES AND CERVICAL SPINE WITHOUT CONTRAST 02/19/2024 06:57:38 PM TECHNIQUE: CT of the head, facial bones and cervical spine was performed without the administration of intravenous contrast. Multiplanar reformatted images are provided for review. Automated exposure control, iterative reconstruction, and/or weight based adjustment of the mA/kV was utilized to reduce the radiation dose to as low as reasonably achievable. COMPARISON: 11/24/2023 CLINICAL HISTORY: Head trauma, minor (Age >= 65y). Pt VERY COMBATIVE did not hold still FINDINGS: CT HEAD BRAIN AND VENTRICLES: No acute intracranial hemorrhage. No mass effect or midline shift. No extra-axial fluid collection.  Gray-white differentiation is maintained. No hydrocephalus. Moderate volume loss and chronic ischemic white matter changes. SKULL AND SCALP: No acute skull fracture. Right frontal scalp hematoma. CT FACIAL BONES FACIAL BONES: No acute facial fracture. No mandibular dislocation. No suspicious bone lesion. ORBITS: No acute traumatic injury. SINUSES AND MASTOIDS: No acute abnormality. SOFT TISSUES: Heterogeneous and enlarged thyroid gland. CT CERVICAL SPINE BONES AND ALIGNMENT: No acute fracture or traumatic malalignment. DEGENERATIVE CHANGES: No significant degenerative changes. SOFT TISSUES: No prevertebral soft tissue swelling. IMPRESSION: 1. No acute intracranial abnormality. 2. Right frontal scalp hematoma. 3. Heterogeneous and enlarged thyroid gland. Electronically signed by: Franky Stanford MD 02/19/2024 07:08 PM EDT RP Workstation: [id number]   CT Cervical Spine Wo Contrast Result Date: 02/19/2024 EXAM: CT HEAD, FACIAL BONES AND CERVICAL SPINE WITHOUT CONTRAST 02/19/2024 06:57:38 PM TECHNIQUE: CT of the head, facial bones and cervical spine was performed without the administration  of intravenous contrast. Multiplanar reformatted images are provided for review. Automated exposure control, iterative reconstruction, and/or weight based adjustment of the mA/kV was utilized to reduce the radiation dose to as low as reasonably achievable. COMPARISON: 11/24/2023 CLINICAL HISTORY: Head trauma, minor (Age >= 65y). Pt VERY COMBATIVE did not hold still FINDINGS: CT HEAD BRAIN AND VENTRICLES: No acute intracranial hemorrhage. No mass effect or midline shift. No extra-axial fluid collection. Gray-white differentiation is maintained. No hydrocephalus. Moderate volume loss and chronic ischemic white matter changes. SKULL AND SCALP: No acute skull fracture. Right frontal scalp hematoma. CT FACIAL BONES FACIAL BONES: No acute facial fracture. No mandibular dislocation. No suspicious bone lesion. ORBITS: No acute traumatic injury.  SINUSES AND MASTOIDS: No acute abnormality. SOFT TISSUES: Heterogeneous and enlarged thyroid gland. CT CERVICAL SPINE BONES AND ALIGNMENT: No acute fracture or traumatic malalignment. DEGENERATIVE CHANGES: No significant degenerative changes. SOFT TISSUES: No prevertebral soft tissue swelling. IMPRESSION: 1. No acute intracranial abnormality. 2. Right frontal scalp hematoma. 3. Heterogeneous and enlarged thyroid gland. Electronically signed by: Franky Stanford MD 02/19/2024 07:08 PM EDT RP Workstation: [id number]   CT Maxillofacial Wo Contrast Result Date: 02/19/2024 EXAM: CT HEAD, FACIAL BONES AND CERVICAL SPINE WITHOUT CONTRAST 02/19/2024 06:57:38 PM TECHNIQUE: CT of the head, facial bones and cervical spine was performed without the administration of intravenous contrast. Multiplanar reformatted images are provided for review. Automated exposure control, iterative reconstruction, and/or weight based adjustment of the mA/kV was utilized to reduce the radiation dose to as low as reasonably achievable. COMPARISON: 11/24/2023 CLINICAL HISTORY: Head trauma, minor (Age >= 65y). Pt VERY COMBATIVE did not hold still FINDINGS: CT HEAD BRAIN AND VENTRICLES: No acute intracranial hemorrhage. No mass effect or midline shift. No extra-axial fluid collection. Gray-white differentiation is maintained. No hydrocephalus. Moderate volume loss and chronic ischemic white matter changes. SKULL AND SCALP: No acute skull fracture. Right frontal scalp hematoma. CT FACIAL BONES FACIAL BONES: No acute facial fracture. No mandibular dislocation. No suspicious bone lesion. ORBITS: No acute traumatic injury. SINUSES AND MASTOIDS: No acute abnormality. SOFT TISSUES: Heterogeneous and enlarged thyroid gland. CT CERVICAL SPINE BONES AND ALIGNMENT: No acute fracture or traumatic malalignment. DEGENERATIVE CHANGES: No significant degenerative changes. SOFT TISSUES: No prevertebral soft tissue swelling. IMPRESSION: 1. No acute intracranial  abnormality. 2. Right frontal scalp hematoma. 3. Heterogeneous and enlarged thyroid gland. Electronically signed by: Franky Stanford MD 02/19/2024 07:08 PM EDT RP Workstation: [id number]       The results of significant diagnostics from this hospitalization (including imaging, microbiology, ancillary and laboratory) are listed below for reference.     Microbiology: No results found for this or any previous visit (from the past 240 hours).   Labs:  CBC: Recent Labs  Lab 02/20/24 1438 02/21/24 0453 02/22/24 0441  WBC 6.4 5.8 4.9  HGB 9.1* 8.7* 8.0*  HCT 29.3* 28.1* 25.2*  MCV 92.1 91.2 89.4  PLT 170 153 135*   BMP &GFR Recent Labs  Lab 02/20/24 1438 02/21/24 0453 02/22/24 0441  NA 137 140 139  K 5.0 4.7 4.3  CL 109 110 108  CO2 23 22 23   GLUCOSE 109* 75 91  BUN 24* 22 19  CREATININE 1.31* 1.17* 1.08*  CALCIUM  9.0 9.1 8.8*  MG  --   --  1.7  PHOS  --   --  3.5   Estimated Creatinine Clearance: 25.4 mL/min (A) (by C-G formula based on SCr of 1.08 mg/dL (H)). Liver & Pancreas: Recent Labs  Lab 02/20/24 1438 02/22/24 0441  AST  28  --   ALT 11  --   ALKPHOS 55  --   BILITOT 0.3  --   PROT 7.1  --   ALBUMIN 3.4* 2.6*   No results for input(s): LIPASE, AMYLASE in the last 168 hours. Recent Labs  Lab 02/20/24 1548  AMMONIA 23   Diabetic: No results for input(s): HGBA1C in the last 72 hours. Recent Labs  Lab 02/20/24 1451  GLUCAP 113*   Cardiac Enzymes: No results for input(s): CKTOTAL, CKMB, CKMBINDEX, TROPONINI in the last 168 hours. No results for input(s): PROBNP in the last 8760 hours. Coagulation Profile: No results for input(s): INR, PROTIME in the last 168 hours. Thyroid Function Tests: No results for input(s): TSH, T4TOTAL, FREET4, T3FREE, THYROIDAB in the last 72 hours. Lipid Profile: No results for input(s): CHOL, HDL, LDLCALC, TRIG, CHOLHDL, LDLDIRECT in the last 72 hours. Anemia Panel: No results  for input(s): VITAMINB12, FOLATE, FERRITIN, TIBC, IRON, RETICCTPCT in the last 72 hours. Urine analysis:    Component Value Date/Time   COLORURINE YELLOW 02/20/2024 1744   APPEARANCEUR HAZY (A) 02/20/2024 1744   LABSPEC 1.013 02/20/2024 1744   PHURINE 7.0 02/20/2024 1744   GLUCOSEU NEGATIVE 02/20/2024 1744   HGBUR NEGATIVE 02/20/2024 1744   BILIRUBINUR NEGATIVE 02/20/2024 1744   KETONESUR NEGATIVE 02/20/2024 1744   PROTEINUR NEGATIVE 02/20/2024 1744   UROBILINOGEN 1.0 11/28/2013 0143   NITRITE NEGATIVE 02/20/2024 1744   LEUKOCYTESUR MODERATE (A) 02/20/2024 1744   Sepsis Labs: Invalid input(s): PROCALCITONIN, LACTICIDVEN   SIGNED:  Nayzeth Altman T Seerat Peaden, MD  [facility] 02/27/2024, 10:07 AM

## 2024-02-27 NOTE — TOC Progression Note (Addendum)
 Transition of Care [facility]) - Progression Note    Patient Details  Name: Jennifer Garrett MRN: [id number] Date of Birth: [birth year]  Transition of Care [facility]) CM/SW Contact  Inocente GORMAN Kindle, LCSW Phone Number: 02/27/2024, 9:35 AM  Clinical Narrative:    9:35am-CSW contacted [facility] and spoke with Crystal. She requested CSW fax over [place] and DC Summary for review to f. [phone].   CSW faxed clinicals and left voicemail for daughter. CSW requested Crystal contact CSW back to let CSW know if transport can be arranged. Provided RN with report #.  11:37 AM-CSW received call from [facility] who stated per the report she was given, patient is max assist so she will have to come see patient in person, which will likely not happen until tomorrow. Updated ICM leadership.   Expected Discharge Plan: Memory Care Barriers to Discharge: Continued Medical Work up               Expected Discharge Plan and Services     Post Acute Care Choice: Nursing Home Living arrangements for the past 2 months: Assisted Living Facility Expected Discharge Date: 02/24/24                                     Social Drivers of Health (SDOH) Interventions SDOH Screenings   Food Insecurity: Patient Unable To Answer (02/20/2024)  Housing: Patient Unable To Answer (02/20/2024)  Transportation Needs: Patient Unable To Answer (02/20/2024)  Utilities: Patient Unable To Answer (02/20/2024)  Social Connections: Patient Unable To Answer (02/20/2024)  Tobacco Use: Medium Risk (02/20/2024)    Readmission Risk Interventions     No data to display

## 2024-02-27 NOTE — Progress Notes (Signed)
 Becomes easily agitated when is incont or when peri care given, otherwise keeps blanket over her head and will pull away when care given.  Has not been combative except during peri care.

## 2024-02-28 NOTE — Progress Notes (Signed)
 Occupational Therapy Treatment Patient Details Name: Jennifer Garrett MRN: [id number] DOB: 24-Aug-1931 Today's Date: 02/28/2024   History of present illness Pt is a 89 y/o female presenting on 8/4 from [facility] with AMS. CT head negative for acute abnormality. Admitted with acute metabolic encephalopathy. Noted seen in ED on 8/3 after fall with R frontal scalp laceration. PMH - HTN, arthritis, breast cancer, irregular heart beat, CAD, disbetes and vascular dementia.   OT comments  Pt supine in bed, encouragement to engage in mobility OOB with very limited success.  Max-total assist +2 to sit at EOB after increased time, pt combative if further mobility was attempted.  Did not follow commands during session.  She calms and agreeable to assist when gospel music is on.  Overall needing total assist +2 for ADLs at this time, but more alert and demonstrating increased strength compared to last OT session.  Anticipate she will do much better in a familiar environment and familiar faces. Will follow acutely and progress as pt is agreeable.       If plan is discharge home, recommend the following:  Other (comment) (total care)   Equipment Recommendations  None recommended by OT    Recommendations for Other Services      Precautions / Restrictions Precautions Precautions: Fall Recall of Precautions/Restrictions: Impaired Restrictions Weight Bearing Restrictions Per Provider Order: No       Mobility Bed Mobility Overal bed mobility: Needs Assistance Bed Mobility: Supine to Sit, Sit to Supine     Supine to sit: +2 for physical assistance, Max assist, Total assist Sit to supine: +2 for physical assistance, Total assist   General bed mobility comments: pt hesitant to mobility today, ultimately transitioned to EOB with max-total assist +2.    Transfers                   General transfer comment: pt declined     Balance Overall balance assessment: Needs  assistance Sitting-balance support: Feet supported Sitting balance-Leahy Scale: Fair Sitting balance - Comments: CGA for safety                                   ADL either performed or assessed with clinical judgement   ADL                                         General ADL Comments: not assesed today, pt not engaging and agitated.  swatting therapists at times.    Extremity/Trunk Assessment              Vision       Perception     Praxis     Communication Communication Communication: Impaired Factors Affecting Communication: Reduced clarity of speech;Difficulty expressing self   Cognition Arousal: Alert Behavior During Therapy: Agitated, Restless, Impulsive Cognition: History of cognitive impairments             OT - Cognition Comments: pt easily agitated when asked to engage in activity when not wanting to.  Calms down to gospel music and able to assist back to bed.                 Following commands: Impaired Following commands impaired: Follows one step commands inconsistently (not following commands when agitated)      Cueing  Cueing Techniques: Verbal cues, Tactile cues, Gestural cues  Exercises      Shoulder Instructions       General Comments pt responding well to gospel music to decrease agitation.    Pertinent Vitals/ Pain       Pain Assessment Pain Assessment: Faces Faces Pain Scale: No hurt Pain Intervention(s): Monitored during session  Home Living                                          Prior Functioning/Environment              Frequency  Min 1X/week        Progress Toward Goals  OT Goals(current goals can now be found in the care plan section)  Progress towards OT goals: Not progressing toward goals - comment;OT to reassess next treatment  Acute Rehab OT Goals Patient Stated Goal: unable OT Goal Formulation: Patient unable to participate in goal  setting Time For Goal Achievement: 03/06/24 Potential to Achieve Goals: Fair  Plan      Co-evaluation    PT/OT/SLP Co-Evaluation/Treatment: Yes Reason for Co-Treatment: For patient/therapist safety;To address functional/ADL transfers;Necessary to address cognition/behavior during functional activity PT goals addressed during session: Mobility/safety with mobility OT goals addressed during session: ADL's and self-care      AM-PAC OT 6 Clicks Daily Activity     Outcome Measure   Help from another person eating meals?: Total Help from another person taking care of personal grooming?: Total Help from another person toileting, which includes using toliet, bedpan, or urinal?: Total Help from another person bathing (including washing, rinsing, drying)?: Total Help from another person to put on and taking off regular upper body clothing?: Total Help from another person to put on and taking off regular lower body clothing?: Total 6 Click Score: 6    End of Session    OT Visit Diagnosis: Other abnormalities of gait and mobility (R26.89);Muscle weakness (generalized) (M62.81)   Activity Tolerance Treatment limited secondary to agitation   Patient Left in bed;with call bell/phone within reach;with bed alarm set   Nurse Communication Mobility status;Precautions        Time: 0930-1001 OT Time Calculation (min): 31 min  Charges: OT General Charges $OT Visit: 1 Visit OT Treatments $Self Care/Home Management : 8-22 mins  Etta NOVAK, OT Acute Rehabilitation Services Office [phone] Secure Chat Preferred    Etta GORMAN Hope 02/28/2024, 10:35 AM

## 2024-02-28 NOTE — Plan of Care (Signed)
 Patient discharged on 02/27/2024 but stayed overnight until she is evaluated by staff from memory care.  No major events overnight of this morning.  Remains stable for discharge.  Discharge orders and summary from 02/27/2024 remains in effect.

## 2024-02-28 NOTE — TOC Transition Note (Signed)
 Transition of Care [facility]) - Discharge Note   Patient Details  Name: Jennifer Garrett MRN: [id number] Date of Birth: [birth year]  [facility]) CM/SW Contact:  Jennifer CHRISTELLA Goodie, LCSW Phone Number: 02/28/2024, 1:57 PM   Clinical Narrative:   CSW contacted [facility], spoke with Crystal. Crystal came to evaluate the patient this morning, needs to review discharge information. CSW sent discharge information again, spoke with [facility] to confirm receipt and that patient can return. [facility] arranged for next available transport. CSW spoke with daughter, Jennifer Garrett, she is in agreement.   Nurse to call report to [phone].    Final next level of care: Memory Care Barriers to Discharge: Barriers Resolved   Patient Goals and CMS Choice Patient states their goals for this hospitalization and ongoing recovery are:: patient unable to participate in goal setting, not fully oriented CMS Medicare.gov Compare Post Acute Care list provided to:: Patient Represenative (must comment) Choice offered to / list presented to : Adult Children      Discharge Placement              Patient chooses bed at: [facility] Patient to be transferred to facility by: [facility] Name of family member notified: Jennifer Garrett Patient and family notified of of transfer: 02/28/24  Discharge Plan and Services Additional resources added to the After Visit Summary for       Post Acute Care Choice: Nursing Home                               Social Drivers of Health (SDOH) Interventions SDOH Screenings   Food Insecurity: Patient Unable To Answer (02/20/2024)  Housing: Patient Unable To Answer (02/20/2024)  Transportation Needs: Patient Unable To Answer (02/20/2024)  Utilities: Patient Unable To Answer (02/20/2024)  Social Connections: Patient Unable To Answer (02/20/2024)  Tobacco Use: Medium Risk (02/20/2024)     Readmission Risk Interventions     No data to display

## 2024-02-28 NOTE — Progress Notes (Signed)
 Physical Therapy Treatment Patient Details Name: Jennifer Garrett MRN: [id number] DOB: 11/23/1931 Today's Date: 02/28/2024   History of Present Illness Pt is a 89 y/o female presenting on 8/4 from [facility] with AMS. CT head negative for acute abnormality. Admitted with acute metabolic encephalopathy. Noted seen in ED on 8/3 after fall with R frontal scalp laceration. PMH - HTN, arthritis, breast cancer, irregular heart beat, CAD, disbetes and vascular dementia.    PT Comments  Pt received supine and pulling blanket over head. Attempted to provide gentle, consistent verbal encouragement and food/drink incentive to engage in mobility OOB, however, pt combative and resistant to all attempts. Pt actively resisting efforts to sit up edge of bed, requiring +2 assist. At end of session, played gospel music which decreased agitation. Feel she would do best in a familiar environment with staff and apartment set up as her performance seems to be limited due to cognition rather than lack of strength.   If plan is discharge home, recommend the following: A lot of help with walking and/or transfers;A lot of help with bathing/dressing/bathroom;Direct supervision/assist for medications management;Direct supervision/assist for financial management;Assist for transportation   Can travel by private vehicle     No  Equipment Recommendations  None recommended by PT    Recommendations for Other Services       Precautions / Restrictions Precautions Precautions: Fall Recall of Precautions/Restrictions: Impaired Restrictions Weight Bearing Restrictions Per Provider Order: No     Mobility  Bed Mobility Overal bed mobility: Needs Assistance Bed Mobility: Supine to Sit, Sit to Supine     Supine to sit: +2 for physical assistance, Max assist, Total assist Sit to supine: +2 for physical assistance, Total assist   General bed mobility comments: pt hesitant to mobility today, ultimately  transitioned to EOB with max-total assist +2.    Transfers                   General transfer comment: pt declined    Ambulation/Gait                   Stairs             Wheelchair Mobility     Tilt Bed    Modified Rankin (Stroke Patients Only)       Balance Overall balance assessment: Needs assistance Sitting-balance support: Feet supported Sitting balance-Leahy Scale: Fair Sitting balance - Comments: CGA for safety                                    Communication Communication Communication: Impaired Factors Affecting Communication: Reduced clarity of speech;Difficulty expressing self  Cognition Arousal: Alert Behavior During Therapy: Agitated, Restless, Impulsive   PT - Cognitive impairments: History of cognitive impairments                         Following commands: Impaired Following commands impaired: Follows one step commands inconsistently (not following commands when agitated)    Cueing Cueing Techniques: Verbal cues, Tactile cues, Gestural cues  Exercises      General Comments General comments (skin integrity, edema, etc.): pt responding well to gospel music to decrease agitation.      Pertinent Vitals/Pain Pain Assessment Pain Assessment: Faces Faces Pain Scale: No hurt    Home Living  Prior Function            PT Goals (current goals can now be found in the care plan section) Acute Rehab PT Goals Potential to Achieve Goals: Fair Progress towards PT goals: Not progressing toward goals - comment    Frequency    Min 1X/week      PT Plan      Co-evaluation PT/OT/SLP Co-Evaluation/Treatment: Yes Reason for Co-Treatment: For patient/therapist safety;To address functional/ADL transfers;Necessary to address cognition/behavior during functional activity PT goals addressed during session: Mobility/safety with mobility OT goals addressed during  session: ADL's and self-care      AM-PAC PT 6 Clicks Mobility   Outcome Measure  Help needed turning from your back to your side while in a flat bed without using bedrails?: Total Help needed moving from lying on your back to sitting on the side of a flat bed without using bedrails?: Total Help needed moving to and from a bed to a chair (including a wheelchair)?: Total Help needed standing up from a chair using your arms (e.g., wheelchair or bedside chair)?: Total Help needed to walk in hospital room?: Total Help needed climbing 3-5 steps with a railing? : Total 6 Click Score: 6    End of Session   Activity Tolerance: Treatment limited secondary to agitation Patient left: in bed;with call bell/phone within reach;with bed alarm set Nurse Communication: Mobility status PT Visit Diagnosis: Muscle weakness (generalized) (M62.81);History of falling (Z91.81)     Time: 0930-1001 PT Time Calculation (min) (ACUTE ONLY): 31 min  Charges:    $Therapeutic Activity: 8-22 mins PT General Charges $$ ACUTE PT VISIT: 1 Visit                     Aleck Garrett, PT, DPT Acute Rehabilitation Services Office [phone]    Jennifer Garrett 02/28/2024, 11:13 AM

## 2024-03-01 ENCOUNTER — Emergency Department (HOSPITAL_COMMUNITY)

## 2024-03-01 ENCOUNTER — Other Ambulatory Visit: Payer: Self-pay

## 2024-03-01 ENCOUNTER — Encounter (HOSPITAL_COMMUNITY): Payer: Self-pay

## 2024-03-01 ENCOUNTER — Emergency Department (HOSPITAL_COMMUNITY)
Admission: EM | Admit: 2024-03-01 | Discharge: 2024-03-02 | Disposition: A | Discharge status: Home / Self Care | Attending: Emergency Medicine | Admitting: Emergency Medicine

## 2024-03-01 DIAGNOSIS — Z79899 Other long term (current) drug therapy: Secondary | ICD-10-CM | POA: Insufficient documentation

## 2024-03-01 DIAGNOSIS — F0390 Unspecified dementia without behavioral disturbance: Secondary | ICD-10-CM | POA: Diagnosis not present

## 2024-03-01 DIAGNOSIS — W08XXXA Fall from other furniture, initial encounter: Secondary | ICD-10-CM | POA: Insufficient documentation

## 2024-03-01 DIAGNOSIS — Z7982 Long term (current) use of aspirin: Secondary | ICD-10-CM | POA: Insufficient documentation

## 2024-03-01 DIAGNOSIS — W19XXXA Unspecified fall, initial encounter: Secondary | ICD-10-CM

## 2024-03-01 DIAGNOSIS — S0990XA Unspecified injury of head, initial encounter: Secondary | ICD-10-CM | POA: Diagnosis present

## 2024-03-01 DIAGNOSIS — S0093XA Contusion of unspecified part of head, initial encounter: Secondary | ICD-10-CM | POA: Insufficient documentation

## 2024-03-01 MED ORDER — HALOPERIDOL LACTATE 5 MG/ML IJ SOLN
2.5000 mg | Freq: Once | INTRAMUSCULAR | Status: AC
  Administered 2024-03-01: 2.5 mg via INTRAMUSCULAR
  Filled 2024-03-01: qty 1

## 2024-03-01 NOTE — Discharge Instructions (Signed)
 As discussed, your evaluation today has been largely reassuring.  But, it is important that you monitor your condition carefully, and do not hesitate to return to the ED if you develop new, or concerning changes in your condition. ? ?Otherwise, please follow-up with your physician for appropriate ongoing care. ? ?

## 2024-03-01 NOTE — ED Triage Notes (Addendum)
 PER EMS: pt is from [facility] with c/o witnessed fall from the couch tonight when attempting to stand up. She has a hematoma to her head, no blood thinners, no LOC. No other injuries. Hx of dementia and is at baseline mentation.   BP- 142/80, HR-76, 99% RA, RR-14

## 2024-03-01 NOTE — ED Notes (Signed)
 [facility] called for transport.

## 2024-03-01 NOTE — ED Provider Notes (Signed)
 [facility] AT [facility] Provider Note   CSN: [id number] Arrival date & time: 03/01/24  2100     Patient presents with: Frimy Uffelman is a 89 y.o. female.   HPI Elderly female dementia presents after witnessed mechanical fall.  Patient cannot provide any details, level 5 caveat.  Reportedly she was on a couch, stood up, fell.  She has a hematoma.    Prior to Admission medications   Medication Sig Start Date End Date Taking? Authorizing Provider  amLODipine  (NORVASC ) 5 MG tablet Take 1 tablet (5 mg total) by mouth daily. 02/27/24 02/26/25  Gonfa, Taye T, MD  aspirin  81 MG chewable tablet Chew 81 mg by mouth daily.    [provider]  Calcium  Carb-Cholecalciferol (CALCIUM  600 + D) 600-5 MG-MCG TABS Take 1 tablet by mouth daily.    [provider]  cyanocobalamin  1000 MCG tablet Take 1 tablet (1,000 mcg total) by mouth daily. 11/14/23   Krishnan, Gokul, MD  divalproex  (DEPAKOTE ) 250 MG DR tablet Take 1 tablet (250 mg total) by mouth 2 (two) times daily. 11/13/23   Krishnan, Gokul, MD  donepezil  (ARICEPT ) 10 MG tablet Take 10 mg by mouth daily. 03/14/16   [provider]  lovastatin (MEVACOR) 40 MG tablet Take 40 mg by mouth daily. 10/19/23   [provider]  QUEtiapine  (SEROQUEL ) 25 MG tablet Take 1 tablet (25 mg total) by mouth 3 times/day as needed-between meals & bedtime (agitation and/or sleep). 02/22/24   Gonfa, Taye T, MD  senna-docusate (SENOKOT-S) 8.6-50 MG tablet Take 1 tablet by mouth 2 (two) times daily between meals as needed for mild constipation. 02/22/24   Gonfa, Taye T, MD    Allergies: Lyrica [pregabalin], Feldene [piroxicam], Orudis [ketoprofen], Vibramycin [doxycycline calcium ], and Zestril [lisinopril]    Review of Systems  Updated Vital Signs BP (!) 167/62 (BP Location: Right Arm)   Pulse 70   Temp (!) 97.1 F (36.2 C) (Tympanic)   Resp 17   Ht 5' (1.524 m)   Wt 50.3 kg   SpO2 98%   BMI 21.68  kg/m   Physical Exam Vitals and nursing note reviewed.  Constitutional:      General: She is not in acute distress.    Appearance: She is well-developed.     Comments: Deconditioned appearing elderly female with notable atrophy.  No distress, smiling  HENT:     Head: Normocephalic.     Comments: No obvious step-offs, crepitus, deformity Eyes:     Conjunctiva/sclera: Conjunctivae normal.  Cardiovascular:     Rate and Rhythm: Normal rate and regular rhythm.  Pulmonary:     Effort: Pulmonary effort is normal. No respiratory distress.     Breath sounds: Normal breath sounds. No stridor.  Abdominal:     General: There is no distension.  Skin:    General: Skin is warm and dry.  Neurological:     Motor: Atrophy present.     Comments: Moves all extremity spontaneously, follows commands intermittently  Psychiatric:        Cognition and Memory: Cognition is impaired. Memory is impaired.     Comments: Pleasant, not insightful     (all labs ordered are listed, but only abnormal results are displayed) Labs Reviewed - No data to display  EKG: None  Radiology: No results found.   Procedures   Medications Ordered in the ED - No data to display  Medical Decision Making Elderly female with dementia presents after witnessed mechanical fall with head trauma.  Patient is not on blood thinners, but given her dementia, uncertainty about her recall, intracranial abnormality, fracture considered. Patient's physical exam otherwise reassuring, vitals reassuring.  Amount and/or Complexity of Data Reviewed Independent Historian: EMS Radiology: ordered and independent interpretation performed. Decision-making details documented in ED Course.  Risk Prescription drug management. Decision regarding hospitalization. Diagnosis or treatment significantly limited by social determinants of health.   Patient in no distress on repeat exam imaging all reassuring,  patient has remained hemodynamically stable for hours in the ED, without evidence for intracranial abnormality, patient will return to her nursing facility.    Final diagnoses:  Fall, initial encounter    ED Discharge Orders     None          Garrick Charleston, MD 03/01/24 2311

## 2024-05-23 ENCOUNTER — Emergency Department (HOSPITAL_COMMUNITY)

## 2024-05-23 ENCOUNTER — Emergency Department (HOSPITAL_COMMUNITY)
Admission: EM | Admit: 2024-05-23 | Discharge: 2024-05-23 | Disposition: A | Discharge status: Skilled Nursing Facility | Source: Skilled Nursing Facility | Attending: Emergency Medicine | Admitting: Emergency Medicine

## 2024-05-23 ENCOUNTER — Other Ambulatory Visit: Payer: Self-pay

## 2024-05-23 ENCOUNTER — Encounter (HOSPITAL_COMMUNITY): Payer: Self-pay

## 2024-05-23 DIAGNOSIS — I2510 Atherosclerotic heart disease of native coronary artery without angina pectoris: Secondary | ICD-10-CM | POA: Insufficient documentation

## 2024-05-23 DIAGNOSIS — Z7982 Long term (current) use of aspirin: Secondary | ICD-10-CM | POA: Diagnosis not present

## 2024-05-23 DIAGNOSIS — Z853 Personal history of malignant neoplasm of breast: Secondary | ICD-10-CM | POA: Insufficient documentation

## 2024-05-23 DIAGNOSIS — Z79899 Other long term (current) drug therapy: Secondary | ICD-10-CM | POA: Diagnosis not present

## 2024-05-23 DIAGNOSIS — E119 Type 2 diabetes mellitus without complications: Secondary | ICD-10-CM | POA: Diagnosis not present

## 2024-05-23 DIAGNOSIS — F0390 Unspecified dementia without behavioral disturbance: Secondary | ICD-10-CM | POA: Insufficient documentation

## 2024-05-23 DIAGNOSIS — S2242XA Multiple fractures of ribs, left side, initial encounter for closed fracture: Secondary | ICD-10-CM | POA: Insufficient documentation

## 2024-05-23 DIAGNOSIS — I10 Essential (primary) hypertension: Secondary | ICD-10-CM | POA: Insufficient documentation

## 2024-05-23 DIAGNOSIS — W19XXXA Unspecified fall, initial encounter: Secondary | ICD-10-CM | POA: Insufficient documentation

## 2024-05-23 DIAGNOSIS — R410 Disorientation, unspecified: Secondary | ICD-10-CM | POA: Diagnosis present

## 2024-05-23 NOTE — ED Triage Notes (Addendum)
 Pt BIB EMS from [facility] unwitnessed fall. Pt had been on the floor for 5 mins. Pt hit the left side of her head. Pt is having head pain. No blood thinners.

## 2024-05-23 NOTE — ED Provider Notes (Signed)
 [place] [facility] AT [facility] Provider Note   CSN: [id number] Arrival date & time: 05/23/24  1536     Patient presents with: Jennifer Garrett is a 89 y.o. female.   Pt is a 89 yo female with pmhx significant for htn, arthritis, hld, breast cancer, stomach ulcers, cad, dm, and dementia.  Pt is from a facility and had an unwitnessed fall.  Pt is unable to give any hx due to her dementia.        Prior to Admission medications   Medication Sig Start Date End Date Taking? Authorizing Provider  amLODipine  (NORVASC ) 5 MG tablet Take 1 tablet (5 mg total) by mouth daily. 02/27/24 02/26/25  Gonfa, Taye T, MD  aspirin  81 MG chewable tablet Chew 81 mg by mouth daily.    [provider]  Calcium  Carb-Cholecalciferol (CALCIUM  600 + D) 600-5 MG-MCG TABS Take 1 tablet by mouth daily.    [provider]  cyanocobalamin  1000 MCG tablet Take 1 tablet (1,000 mcg total) by mouth daily. 11/14/23   Krishnan, Gokul, MD  divalproex  (DEPAKOTE ) 250 MG DR tablet Take 1 tablet (250 mg total) by mouth 2 (two) times daily. 11/13/23   Krishnan, Gokul, MD  donepezil  (ARICEPT ) 10 MG tablet Take 10 mg by mouth daily. 03/14/16   [provider]  lovastatin (MEVACOR) 40 MG tablet Take 40 mg by mouth daily. 10/19/23   [provider]  QUEtiapine  (SEROQUEL ) 25 MG tablet Take 1 tablet (25 mg total) by mouth 3 times/day as needed-between meals & bedtime (agitation and/or sleep). 02/22/24   Gonfa, Taye T, MD  senna-docusate (SENOKOT-S) 8.6-50 MG tablet Take 1 tablet by mouth 2 (two) times daily between meals as needed for mild constipation. 02/22/24   Gonfa, Taye T, MD    Allergies: Lyrica [pregabalin], Feldene [piroxicam], Orudis [ketoprofen], Vibramycin [doxycycline calcium ], and Zestril [lisinopril]    Review of Systems  Unable to perform ROS: Dementia  All other systems reviewed and are negative.   Updated Vital Signs BP (!) 148/68 (BP Location: Right Arm)    Pulse 69   Temp 98.3 F (36.8 C) (Axillary)   Resp 16   SpO2 96%   Physical Exam Vitals and nursing note reviewed.  Constitutional:      Appearance: Normal appearance.  HENT:     Head: Normocephalic and atraumatic.     Right Ear: External ear normal.     Left Ear: External ear normal.     Nose: Nose normal.     Mouth/Throat:     Mouth: Mucous membranes are moist.     Pharynx: Oropharynx is clear.  Eyes:     Extraocular Movements: Extraocular movements intact.     Conjunctiva/sclera: Conjunctivae normal.     Pupils: Pupils are equal, round, and reactive to light.  Cardiovascular:     Rate and Rhythm: Normal rate and regular rhythm.     Pulses: Normal pulses.     Heart sounds: Normal heart sounds.  Pulmonary:     Effort: Pulmonary effort is normal.     Breath sounds: Normal breath sounds.  Abdominal:     General: Abdomen is flat. Bowel sounds are normal.     Palpations: Abdomen is soft.  Musculoskeletal:        General: Normal range of motion.     Cervical back: Normal range of motion and neck supple.  Skin:    General: Skin is warm.     Capillary Refill:  Capillary refill takes less than 2 seconds.  Neurological:     Mental Status: She is alert. Mental status is at baseline. She is disoriented.     Comments: Pt is moving all 4 extremities.  She is saying nothing that makes sense.  She is not oriented.  This is her baseline per EMS.  Psychiatric:        Behavior: Behavior is agitated.     (all labs ordered are listed, but only abnormal results are displayed) Labs Reviewed - No data to display  EKG: None  Radiology: No results found.   Procedures   Medications Ordered in the ED - No data to display                                  Medical Decision Making Amount and/or Complexity of Data Reviewed Radiology: ordered.   This patient presents to the ED for concern of fall, this involves an extensive number of treatment options, and is a complaint that  carries with it a high risk of complications and morbidity.  The differential diagnosis includes multiple trauma   Co morbidities that complicate the patient evaluation   htn, arthritis, hld, breast cancer, stomach ulcers, cad, dm, and dementia   Additional history obtained:  Additional history obtained from epic chart review External records from outside source obtained and reviewed including EMS report   Imaging Studies ordered:  I ordered imaging studies including ct head/ct cspine/cxr/pelvis  I independently visualized and interpreted imaging which showed  CT head:  No acute intracranial abnormality.  2. Posterior scalp hematoma.  3. Motion-degraded exam limiting assessment due to artifact.  CT c-spine: No acute abnormality of the cervical spine.  2. Straightening of the normal cervical lordosis.  3. Degenerative anterolisthesis at C4-5 and C5-6.  CXR: Fractures of the posterolateral left 4th and 5th ribs.  2. Cardiomegaly with vascular congestion.  3. Aortic atherosclerosis.  Pelvis: No acute findings.  I agree with the radiologist interpretation   Medicines ordered and prescription drug management:   I have reviewed the patients home medicines and have made adjustments as needed   Test Considered:  ct   Problem List / ED Course:  Fall with rib fx (4th and 5th ribs).  Pt does not seem to have significant pain as she is lying on that side.  Pt's daughter notified by phone.  I don't think pt will be able to follow instructions on an incentive spirometer, so that was not given.  She is oxygenating well. Posterior scalp hematoma:  no intracranial abn   Reevaluation:  After the interventions noted above, I reevaluated the patient and found that they have :improved   Social Determinants of Health:  Lives in facility   Dispostion:  After consideration of the diagnostic results and the patients response to treatment, I feel that the patent would benefit  from discharge with outpatient f/u.       Final diagnoses:  None    ED Discharge Orders     None          Dean Clarity, MD 05/23/24 1756
